# Patient Record
Sex: Female | Born: 1935 | Race: Black or African American | Hispanic: No | Marital: Single | Smoking: Never smoker
Health system: Southern US, Community
[De-identification: ages and names within clinical notes are randomized; demographics above are authoritative.]

## PROBLEM LIST (undated history)

## (undated) DIAGNOSIS — I5030 Unspecified diastolic (congestive) heart failure: Secondary | ICD-10-CM

## (undated) DIAGNOSIS — M109 Gout, unspecified: Secondary | ICD-10-CM

## (undated) DIAGNOSIS — M1990 Unspecified osteoarthritis, unspecified site: Secondary | ICD-10-CM

## (undated) DIAGNOSIS — N186 End stage renal disease: Secondary | ICD-10-CM

## (undated) DIAGNOSIS — I499 Cardiac arrhythmia, unspecified: Secondary | ICD-10-CM

## (undated) DIAGNOSIS — Z8614 Personal history of Methicillin resistant Staphylococcus aureus infection: Secondary | ICD-10-CM

## (undated) DIAGNOSIS — I480 Paroxysmal atrial fibrillation: Secondary | ICD-10-CM

## (undated) DIAGNOSIS — J40 Bronchitis, not specified as acute or chronic: Secondary | ICD-10-CM

## (undated) DIAGNOSIS — M549 Dorsalgia, unspecified: Secondary | ICD-10-CM

## (undated) DIAGNOSIS — H269 Unspecified cataract: Secondary | ICD-10-CM

## (undated) DIAGNOSIS — I10 Essential (primary) hypertension: Secondary | ICD-10-CM

## (undated) DIAGNOSIS — E119 Type 2 diabetes mellitus without complications: Secondary | ICD-10-CM

## (undated) DIAGNOSIS — Z992 Dependence on renal dialysis: Secondary | ICD-10-CM

## (undated) HISTORY — PX: EYE SURGERY: SHX253

## (undated) HISTORY — DX: Unspecified cataract: H26.9

## (undated) HISTORY — PX: CATARACT EXTRACTION, BILATERAL: SHX1313

---

## 2016-10-18 DIAGNOSIS — I361 Nonrheumatic tricuspid (valve) insufficiency: Secondary | ICD-10-CM | POA: Diagnosis not present

## 2016-10-18 DIAGNOSIS — I340 Nonrheumatic mitral (valve) insufficiency: Secondary | ICD-10-CM | POA: Diagnosis not present

## 2016-10-18 DIAGNOSIS — I509 Heart failure, unspecified: Secondary | ICD-10-CM | POA: Diagnosis not present

## 2016-10-29 DIAGNOSIS — E669 Obesity, unspecified: Secondary | ICD-10-CM | POA: Diagnosis not present

## 2016-10-29 DIAGNOSIS — M109 Gout, unspecified: Secondary | ICD-10-CM | POA: Diagnosis not present

## 2016-10-29 DIAGNOSIS — E1122 Type 2 diabetes mellitus with diabetic chronic kidney disease: Secondary | ICD-10-CM | POA: Diagnosis not present

## 2016-10-29 DIAGNOSIS — N184 Chronic kidney disease, stage 4 (severe): Secondary | ICD-10-CM | POA: Diagnosis not present

## 2016-10-29 DIAGNOSIS — D631 Anemia in chronic kidney disease: Secondary | ICD-10-CM | POA: Diagnosis not present

## 2016-10-29 DIAGNOSIS — I130 Hypertensive heart and chronic kidney disease with heart failure and stage 1 through stage 4 chronic kidney disease, or unspecified chronic kidney disease: Secondary | ICD-10-CM | POA: Diagnosis not present

## 2017-03-18 ENCOUNTER — Encounter
Admission: EM | Disposition: A | Discharge status: Home / Self Care | Payer: Self-pay | Source: Home / Self Care | Attending: Emergency Medicine

## 2017-03-18 ENCOUNTER — Encounter: Payer: Self-pay | Admitting: Emergency Medicine

## 2017-03-18 ENCOUNTER — Emergency Department
Admission: EM | Admit: 2017-03-18 | Discharge: 2017-03-18 | Disposition: A | Discharge status: Home / Self Care | Payer: Medicare Other | Attending: Emergency Medicine | Admitting: Emergency Medicine

## 2017-03-18 ENCOUNTER — Emergency Department: Payer: Medicare Other

## 2017-03-18 DIAGNOSIS — Z79899 Other long term (current) drug therapy: Secondary | ICD-10-CM | POA: Diagnosis not present

## 2017-03-18 DIAGNOSIS — E119 Type 2 diabetes mellitus without complications: Secondary | ICD-10-CM | POA: Diagnosis not present

## 2017-03-18 DIAGNOSIS — T82868A Thrombosis of vascular prosthetic devices, implants and grafts, initial encounter: Secondary | ICD-10-CM | POA: Diagnosis not present

## 2017-03-18 DIAGNOSIS — T8242XA Displacement of vascular dialysis catheter, initial encounter: Secondary | ICD-10-CM | POA: Diagnosis not present

## 2017-03-18 DIAGNOSIS — I120 Hypertensive chronic kidney disease with stage 5 chronic kidney disease or end stage renal disease: Secondary | ICD-10-CM | POA: Diagnosis not present

## 2017-03-18 DIAGNOSIS — Y828 Other medical devices associated with adverse incidents: Secondary | ICD-10-CM | POA: Insufficient documentation

## 2017-03-18 DIAGNOSIS — N186 End stage renal disease: Secondary | ICD-10-CM | POA: Diagnosis not present

## 2017-03-18 DIAGNOSIS — E8770 Fluid overload, unspecified: Secondary | ICD-10-CM | POA: Insufficient documentation

## 2017-03-18 DIAGNOSIS — N185 Chronic kidney disease, stage 5: Secondary | ICD-10-CM | POA: Diagnosis not present

## 2017-03-18 DIAGNOSIS — Z452 Encounter for adjustment and management of vascular access device: Secondary | ICD-10-CM | POA: Diagnosis present

## 2017-03-18 DIAGNOSIS — E785 Hyperlipidemia, unspecified: Secondary | ICD-10-CM | POA: Diagnosis not present

## 2017-03-18 DIAGNOSIS — I10 Essential (primary) hypertension: Secondary | ICD-10-CM | POA: Diagnosis not present

## 2017-03-18 HISTORY — PX: DIALYSIS/PERMA CATHETER INSERTION: CATH118288

## 2017-03-18 HISTORY — DX: Essential (primary) hypertension: I10

## 2017-03-18 HISTORY — DX: Dependence on renal dialysis: Z99.2

## 2017-03-18 HISTORY — DX: Type 2 diabetes mellitus without complications: E11.9

## 2017-03-18 LAB — CBC WITH DIFFERENTIAL/PLATELET
Basophils Absolute: 0.1 10*3/uL (ref 0–0.1)
Basophils Relative: 1 %
EOS ABS: 0.2 10*3/uL (ref 0–0.7)
Eosinophils Relative: 3 %
HEMATOCRIT: 23.3 % — AB (ref 35.0–47.0)
HEMOGLOBIN: 7.7 g/dL — AB (ref 12.0–16.0)
LYMPHS ABS: 1.6 10*3/uL (ref 1.0–3.6)
Lymphocytes Relative: 19 %
MCH: 31.7 pg (ref 26.0–34.0)
MCHC: 32.9 g/dL (ref 32.0–36.0)
MCV: 96.4 fL (ref 80.0–100.0)
MONO ABS: 0.9 10*3/uL (ref 0.2–0.9)
MONOS PCT: 10 %
NEUTROS ABS: 5.8 10*3/uL (ref 1.4–6.5)
NEUTROS PCT: 67 %
Platelets: 253 10*3/uL (ref 150–440)
RBC: 2.42 MIL/uL — ABNORMAL LOW (ref 3.80–5.20)
RDW: 18 % — AB (ref 11.5–14.5)
WBC: 8.5 10*3/uL (ref 3.6–11.0)

## 2017-03-18 LAB — HEPATIC FUNCTION PANEL
ALBUMIN: 3.5 g/dL (ref 3.5–5.0)
ALT: 16 U/L (ref 14–54)
AST: 23 U/L (ref 15–41)
Alkaline Phosphatase: 115 U/L (ref 38–126)
BILIRUBIN TOTAL: 0.6 mg/dL (ref 0.3–1.2)
Bilirubin, Direct: <0.1 mg/dL — ABNORMAL LOW (ref 0.1–0.5)
Total Protein: 7.8 g/dL (ref 6.5–8.1)

## 2017-03-18 LAB — GLUCOSE, CAPILLARY: Glucose-Capillary: 99 mg/dL (ref 65–99)

## 2017-03-18 LAB — BASIC METABOLIC PANEL
ANION GAP: 14 (ref 5–15)
BUN: 52 mg/dL — ABNORMAL HIGH (ref 6–20)
CALCIUM: 9.2 mg/dL (ref 8.9–10.3)
CO2: 26 mmol/L (ref 22–32)
Chloride: 101 mmol/L (ref 101–111)
Creatinine, Ser: 6.19 mg/dL — ABNORMAL HIGH (ref 0.44–1.00)
GFR calc non Af Amer: 6 mL/min — ABNORMAL LOW (ref >60)
GFR, EST AFRICAN AMERICAN: 6 mL/min — AB (ref >60)
GLUCOSE: 114 mg/dL — AB (ref 65–99)
POTASSIUM: 4.5 mmol/L (ref 3.5–5.1)
Sodium: 141 mmol/L (ref 135–145)

## 2017-03-18 LAB — PROTIME-INR
INR: 1.22
Prothrombin Time: 15.3 seconds — ABNORMAL HIGH (ref 11.4–15.2)

## 2017-03-18 SURGERY — DIALYSIS/PERMA CATHETER INSERTION
Anesthesia: Moderate Sedation

## 2017-03-18 MED ORDER — LIDOCAINE-EPINEPHRINE (PF) 2 %-1:200000 IJ SOLN
INTRAMUSCULAR | Status: AC
  Filled 2017-03-18: qty 20

## 2017-03-18 MED ORDER — FENTANYL CITRATE (PF) 100 MCG/2ML IJ SOLN
INTRAMUSCULAR | Status: DC | PRN
  Administered 2017-03-18 (×2): 50 ug via INTRAVENOUS

## 2017-03-18 MED ORDER — CEFAZOLIN SODIUM-DEXTROSE 2-4 GM/100ML-% IV SOLN
INTRAVENOUS | Status: AC
  Filled 2017-03-18: qty 100

## 2017-03-18 MED ORDER — MIDAZOLAM HCL 5 MG/5ML IJ SOLN
INTRAMUSCULAR | Status: AC
  Filled 2017-03-18: qty 5

## 2017-03-18 MED ORDER — MIDAZOLAM HCL 2 MG/2ML IJ SOLN
INTRAMUSCULAR | Status: DC | PRN
  Administered 2017-03-18 (×2): 1 mg via INTRAVENOUS

## 2017-03-18 MED ORDER — CEFAZOLIN SODIUM-DEXTROSE 2-4 GM/100ML-% IV SOLN
2.0000 g | Freq: Once | INTRAVENOUS | Status: AC
  Administered 2017-03-18: 2 g via INTRAVENOUS

## 2017-03-18 MED ORDER — SODIUM CHLORIDE 0.9 % IV SOLN: Freq: Once | INTRAVENOUS | Status: DC

## 2017-03-18 MED ORDER — HEPARIN (PORCINE) IN NACL 2-0.9 UNIT/ML-% IJ SOLN
INTRAMUSCULAR | Status: AC
  Filled 2017-03-18: qty 500

## 2017-03-18 MED ORDER — FENTANYL CITRATE (PF) 100 MCG/2ML IJ SOLN
INTRAMUSCULAR | Status: AC
  Filled 2017-03-18: qty 2

## 2017-03-18 MED ORDER — HEPARIN SODIUM (PORCINE) 10000 UNIT/ML IJ SOLN
INTRAMUSCULAR | Status: AC
  Filled 2017-03-18: qty 1

## 2017-03-18 MED ORDER — CEFAZOLIN SODIUM-DEXTROSE 1-4 GM/50ML-% IV SOLN: 1.0000 g | INTRAVENOUS | Status: DC

## 2017-03-18 SURGICAL SUPPLY — 10 items
CATH PALINDROME RT-P 15FX23CM (CATHETERS) ×3 IMPLANT
DERMABOND ADVANCED (GAUZE/BANDAGES/DRESSINGS) ×2
DERMABOND ADVANCED .7 DNX12 (GAUZE/BANDAGES/DRESSINGS) ×1 IMPLANT
DRAPE INCISE IOBAN 66X45 STRL (DRAPES) ×3 IMPLANT
GUIDEWIRE SUPER STIFF .035X180 (WIRE) ×3 IMPLANT
NEEDLE ENTRY 21GA 7CM ECHOTIP (NEEDLE) ×3 IMPLANT
PACK ANGIOGRAPHY (CUSTOM PROCEDURE TRAY) ×3 IMPLANT
SET INTRO CAPELLA COAXIAL (SET/KITS/TRAYS/PACK) ×3 IMPLANT
SUT MNCRL AB 4-0 PS2 18 (SUTURE) ×3 IMPLANT
SUT SILK 0 FSL (SUTURE) ×3 IMPLANT

## 2017-03-18 NOTE — ED Notes (Signed)
Patient transported to Special recovery by Jonni Sanger, ED Medic. Patient will be discharged from specials per Dr. Corky Downs.

## 2017-03-18 NOTE — ED Notes (Signed)
Spoke with [place] from vascular in regards to patients procedure. She states Dr. Delana Meyer is scheduled to be in the OR until 1500 but is going to try to fit her in. Dr. Corky Downs made aware. Patient updated as well. Alyse Low states she will call once they are ready for her.

## 2017-03-18 NOTE — Op Note (Signed)
[facility]   OPERATIVE NOTE     PROCEDURE: 1. Insertion of a left IJ tunneled dialysis catheter. 2. Catheter placement and cannulation under ultrasound and fluoroscopic guidance  PRE-OPERATIVE DIAGNOSIS: end-stage renal requiring hemodialysis  POST-OPERATIVE DIAGNOSIS: same as above  SURGEON: Hortencia Pilar  ANESTHESIA: Conscious sedation was administered under my direct supervision by the interventional radiology RN. IV Versed plus fentanyl were utilized. Continuous ECG, pulse oximetry and blood pressure was monitored throughout the entire procedure.  Conscious sedation was for a total of 21 minutes.  ESTIMATED BLOOD LOSS: Minimal  FINDING(S): 1.  Tips of the catheter in the right atrium on fluoroscopy 2.  No obvious pneumothorax on fluoroscopy  SPECIMEN(S):  none  INDICATIONS:   Stacy Pham is a 83 y.o. female  presents with end stage renal disease.  Therefore, the patient requires a tunneled dialysis catheter placement.  The patient is informed of  the risks catheter placement include but are not limited to: bleeding, infection, central venous injury, pneumothorax, possible venous stenosis, possible malpositioning in the venous system, and possible infections related to long-term catheter presence.  The patient was aware of these risks and agreed to proceed.  DESCRIPTION: The patient was taken back to Special Procedure suite.  Prior to sedation, the patient was given IV antibiotics.  After obtaining adequate sedation, the patient was prepped and draped in the standard fashion for a chest or neck tunneled dialysis catheter placement.  Appropriate Time Out is called.     The the left neck and chest wall are then infiltrated with 1% Lidocaine with epinepherine.  A 23 cm tip to cuff pallindrome catheter is then selected, opened on the back table and prepped. Ultrasound is placed in a sterile sleeve.  Under ultrasound guidance, the left IJ vein is examined  and is noted to be echolucent and easily compressible indicating patency.   An image is recorded for the permanent record.  The left IJ vein is cannulated with the microneedle under direct ultrasound vissualization.  A Microwire followed by a micro sheath is then inserted without difficulty.   A J-wire was then advanced under fluoroscopic guidance into the inferior vena cava and the wire was secured.  Small counter incision was then made at the wire insertion site. A small pocket was fashioned with blunt dissection to allow easier passage of the cuff.  The dilator and peel-away sheath are then advanced over the wire under fluoroscopic guidance. The catheters and advanced through the peel-away sheath. It is approximated to the chest wall after verifying the tips at the atrial caval junction and an exit site is selected.  Small incision is made at the selected exit site and the tunneling device was passed subcutaneously to the neck counter incision. Catheter is then pulled through the subcutaneous tunnel. The catheter is then verified for tip position under fluoroscopy, transected and the hub assembly connected.    Each port was tested by aspirating and flushing.  No resistance was noted.  Each port was then thoroughly flushed with heparinized saline.  The catheter was secured in placed with two interrupted stitches of 0 silk tied to the catheter.  The neck incision was closed with a U-stitch of 4-0 Monocryl.  The neck and chest incision were cleaned and sterile bandages applied including a Biopatch.  Each port was then packed with concentrated heparin (1000 Units/mL) at the manufacturer recommended volumes to each port.  Sterile caps were applied to each port.  On completion fluoroscopy, the  tips of the catheter were in the right atrium, and there was no evidence of pneumothorax.  COMPLICATIONS: None  CONDITION: Unchanged   Hortencia Pilar [facility] Office: [phone]   03/18/2017,  4:06 PM

## 2017-03-18 NOTE — ED Notes (Signed)
Patient made aware of need of urine sample. States she is unable to void at this time.  

## 2017-03-18 NOTE — ED Provider Notes (Addendum)
[facility] Emergency Department Provider Note  ____________________________________________   First MD Initiated Contact with Patient 03/18/17 [phone]     (approximate)  I have reviewed the triage vital signs and the nursing notes.   HISTORY  Chief Complaint Vascular Access Problem    HPI Stacy Pham is a 83 y.o. female who self presents to the emergency department after inadvertently dislodging her dialysis catheter. She is a past medical history of hypertension and end-stage renal disease for which she receives hemodialysis Monday Wednesday and Friday. She was getting dressed this morning to go to dialysis and when she put on her closed she inadvertently ripped out her vascath.  the patient moved to [place] from [place] earlier this week and has yet to establish care with a primary care physician or nephrologist. She has yet to have planning for an AV fistula. She denies current chest pain shortness of breath abdominal pain nausea or vomiting.   Past Medical History:  Diagnosis Date  . Diabetes mellitus without complication ([place])   . Dialysis patient ([place])    Mon-Wed-Friday  . Hypertension   . Renal failure     There are no active problems to display for this patient.   History reviewed. No pertinent surgical history.  Prior to Admission medications   Medication Sig Start Date End Date Taking? Authorizing Provider  allopurinol (ZYLOPRIM) 100 MG tablet Take 100 mg by mouth daily.   Yes [provider]  apixaban (ELIQUIS) 2.5 MG TABS tablet Take 2.5 mg by mouth 2 (two) times daily.   Yes [provider]  atorvastatin (LIPITOR) 20 MG tablet Take 20 mg by mouth daily at 6 PM.   Yes [provider]  colchicine 0.6 MG tablet Take 0.6 mg by mouth every other day.   Yes [provider]  diltiazem (DILACOR XR) 240 MG 24 hr capsule Take 240 mg by mouth daily.   Yes [provider]  gabapentin  (NEURONTIN) 100 MG capsule Take 100 mg by mouth 3 (three) times daily.   Yes [provider]  labetalol (NORMODYNE) 300 MG tablet Take 300 mg by mouth 3 (three) times daily.   Yes [provider]  losartan (COZAAR) 100 MG tablet Take 100 mg by mouth daily.   Yes [provider]    Allergies Patient has no known allergies.  No family history on file.  Social History Social History  Substance Use Topics  . Smoking status: Not on file  . Smokeless tobacco: Not on file  . Alcohol use Not on file    Review of Systems Constitutional: No fever/chills Eyes: No visual changes. ENT: No sore throat. Cardiovascular: Denies chest pain. Respiratory: Denies shortness of breath. Gastrointestinal: No abdominal pain.  No nausea, no vomiting.  No diarrhea.  No constipation. Genitourinary: Negative for dysuria. Musculoskeletal: Negative for back pain. Skin: Negative for rash. Neurological: Negative for headaches, focal weakness or numbness.   ____________________________________________   PHYSICAL EXAM:  VITAL SIGNS: ED Triage Vitals  Enc Vitals Group     BP 03/18/17 0549 (!) 195/56     Pulse Rate 03/18/17 0549 75     Resp 03/18/17 0549 20     Temp 03/18/17 0549 98.7 F (37.1 C)     Temp Source 03/18/17 0549 Oral     SpO2 03/18/17 0549 98 %     Weight 03/18/17 0546 181 lb (82.1 kg)     Height 03/18/17 0546 5\' 2"  (1.575 m)  Head Circumference --      Peak Flow --      Pain Score --      Pain Loc --      Pain Edu? --      Excl. in [place]? --     Constitutional: alert and oriented 4 pleasant cooperative speaks in full clear sentences no diaphoresis Eyes: PERRL EOMI. Head: Atraumatic. Nose: No congestion/rhinnorhea. Mouth/Throat: No trismus Neck: No stridor.   Cardiovascular: Normal rate, regular rhythm. Grossly normal heart sounds.  Good peripheral circulation. Respiratory: Normal respiratory effort.  No retractions. Lungs CTAB and moving good  air Gastrointestinal: soft nontender Musculoskeletal: 1+ pitting edema bilateral lower extremities legs equal in size Neurologic:  Normal speech and language. No gross focal neurologic deficits are appreciated. Skin:  Vas-Cath to right subclavian completely removed by the time she got here no bleeding or hematoma around the site Psychiatric: Mood and affect are normal. Speech and behavior are normal.    ____________________________________________   DIFFERENTIAL includes but not limited to  hyperkalemia, fluid overload, acidemia, end-stage renal disease ____________________________________________   LABS (all labs ordered are listed, but only abnormal results are displayed)  Labs Reviewed  BASIC METABOLIC PANEL - Abnormal; Notable for the following:       Result Value   Glucose, Bld 114 (*)    BUN 52 (*)    Creatinine, Ser 6.19 (*)    GFR calc non Af Amer 6 (*)    GFR calc Af Amer 6 (*)    All other components within normal limits  HEPATIC FUNCTION PANEL - Abnormal; Notable for the following:    Bilirubin, Direct <0.1 (*)    All other components within normal limits  CBC WITH DIFFERENTIAL/PLATELET - Abnormal; Notable for the following:    RBC 2.42 (*)    Hemoglobin 7.7 (*)    HCT 23.3 (*)    RDW 18.0 (*)    All other components within normal limits  PROTIME-INR - Abnormal; Notable for the following:    Prothrombin Time 15.3 (*)    All other components within normal limits  URINALYSIS, COMPLETE (UACMP) WITH MICROSCOPIC    no indication for emergent dialysis __________________________________________  EKG  ED ECG REPORT I, Darel Hong, the attending physician, personally viewed and interpreted this ECG.  Date: 03/18/2017 EKG Time:  Rate: 69 Rhythm: normal sinus rhythm QRS Axis: normal Intervals: normal ST/T Wave abnormalities: normal Narrative Interpretation: no evidence of acute ischemia  ____________________________________________  RADIOLOGY  chest  x-ray with cardiomegaly but no obvious fluid overload ____________________________________________   PROCEDURES  Procedure(s) performed: no  Procedures  Critical Care performed: no  Observation: no ____________________________________________   INITIAL IMPRESSION / ASSESSMENT AND PLAN / ED COURSE  Pertinent labs & imaging results that were available during my care of the patient were reviewed by me and considered in my medical decision making (see chart for details).  the patient arrives hemodynamically stable and very well-appearing. Her Vas-Cath is completely removed. Chest x-ray confirms no pneumothorax. Labs are pending but the patient will likely require inpatient admission for IR guided placement of a new hemodialysis catheter.    ----------------------------------------- ----------------------------------------- 7:19 AM on 03/18/2017 -----------------------------------------  I discussed the case with on-call vascular surgeon Dr. Delana Meyer who will kindly place a new dialysis catheter today.________________   FINAL CLINICAL IMPRESSION(S) / ED DIAGNOSES  Final diagnoses:  End stage renal disease ([place])      NEW MEDICATIONS STARTED DURING THIS VISIT:  New Prescriptions   No medications  on file     Note:  This document was prepared using Dragon voice recognition software and may include unintentional dictation errors.     Darel Hong, MD 03/18/17 6945    Darel Hong, MD 03/18/17 0388    Darel Hong, MD 03/18/17 [phone]

## 2017-03-18 NOTE — ED Notes (Signed)
Pt's vascular access for dialysis pulled out. Tip appears intact. Catheter saved in plastic sack for further inspection. Pt denies pain at this time. Bleeding from access site controlled at this time.

## 2017-03-18 NOTE — ED Provider Notes (Signed)
Patient to special procedures for catheter placement. Seen by nephrology, dialysis organized for tomorrow   Lavonia Drafts, MD 03/18/17 1315

## 2017-03-18 NOTE — ED Triage Notes (Signed)
Pt to triage via w/c with no distress noted; pt reports dialysis cath pulled out this morning, due dialysis today; dressing in place with no bleeding; pt with no c/o

## 2017-03-18 NOTE — H&P (Signed)
[facility] Admission History & Physical  MRN : [id number]  Stacy Pham is a 83 y.o. ([birth year]) female who presents with chief complaint of  Chief Complaint  Patient presents with  . Vascular Access Problem  .  History of Present Illness: the patient is an 83 year old woman who presented to [facility] earlier this morningafter she accidentally pulled out her right IJ tunneled catheter. She presented to [place] from [place] Wednesday at [place]. Currently she is scheduled for dialysis Monday Wednesdays and Fridays.  Patient denies fever chills at home. No nausea vomiting. No recent illnesses. She denied drainage at the catheter site. This was for first catheter and she is new to dialysis and has not been worked up for upper extremity access.    No current facility-administered medications for this encounter.    Current Outpatient Prescriptions  Medication Sig Dispense Refill  . allopurinol (ZYLOPRIM) 100 MG tablet Take 100 mg by mouth daily.    [name] apixaban (ELIQUIS) 2.5 MG TABS tablet Take 2.5 mg by mouth 2 (two) times daily.    [name] atorvastatin (LIPITOR) 20 MG tablet Take 20 mg by mouth daily at 6 PM.    . colchicine 0.6 MG tablet Take 0.6 mg by mouth every other day.    . diltiazem (DILACOR XR) 240 MG 24 hr capsule Take 240 mg by mouth daily.    [name] gabapentin (NEURONTIN) 100 MG capsule Take 100 mg by mouth 3 (three) times daily.    [name] labetalol (NORMODYNE) 300 MG tablet Take 300 mg by mouth 3 (three) times daily.    [name] losartan (COZAAR) 100 MG tablet Take 100 mg by mouth daily.      Past Medical History:  Diagnosis Date  . Diabetes mellitus without complication ([place])   . Dialysis patient ([place])    Mon-Wed-Friday  . Hypertension   . Renal failure     History reviewed. No pertinent surgical history.  Social History Social History  Substance Use Topics  .  Smoking status: Not on file  . Smokeless tobacco: Not on file  . Alcohol use Not on file    Family History No family history on file. No family history of bleeding/clotting disorders, porphyria or autoimmune disease   No Known Allergies   REVIEW OF SYSTEMS (Negative unless checked)  Constitutional: [] Weight loss  [] Fever  [] Chills Cardiac: [] Chest pain   [] Chest pressure   [] Palpitations   [] Shortness of breath when laying flat   [] Shortness of breath at rest   [] Shortness of breath with exertion. Vascular:  [] Pain in legs with walking   [] Pain in legs at rest   [] Pain in legs when laying flat   [] Claudication   [] Pain in feet when walking  [] Pain in feet at rest  [] Pain in feet when laying flat   [] History of DVT   [] Phlebitis   [] Swelling in legs   [] Varicose veins   [] Non-healing ulcers Pulmonary:   [] Uses home oxygen   [] Productive cough   [] Hemoptysis   [] Wheeze  [] COPD   [] Asthma Neurologic:  [] Dizziness  [] Blackouts   [] Seizures   [] History of stroke   [] History of TIA  [] Aphasia   [] Temporary blindness   [] Dysphagia   [] Weakness or numbness in arms   [] Weakness or numbness in legs Musculoskeletal:  [] Arthritis   [] Joint swelling   [] Joint pain   [] Low back pain Hematologic:  [] Easy bruising  []   Easy bleeding   [] Hypercoagulable state   [] Anemic  [] Hepatitis Gastrointestinal:  [] Blood in stool   [] Vomiting blood  [] Gastroesophageal reflux/heartburn   [] Difficulty swallowing. Genitourinary:  [x] Chronic kidney disease   [] Difficult urination  [] Frequent urination  [] Burning with urination   [] Blood in urine Skin:  [] Rashes   [] Ulcers   [] Wounds Psychological:  [] History of anxiety   []  History of major depression.  Physical Examination  Vitals:   03/18/17 0830 03/18/17 0930 03/18/17 1000 03/18/17 1030  BP: (!) 177/62 (!) 188/69 (!) 181/62 (!) 172/78  Pulse: 69 71 69 72  Resp: 18 18 14 17   Temp:      TempSrc:      SpO2: 100% 95% 99% 100%  Weight:      Height:       Body mass  index is 33.11 kg/m. Gen: WD/WN, NAD Head: [place]/AT, No temporalis wasting. Prominent temp pulse not noted. Ear/Nose/Throat: Hearing grossly intact, nares w/o erythema or drainage, oropharynx w/o Erythema/Exudate,  Eyes: Conjunctiva clear, sclera non-icteric Neck: Trachea midline.  No JVD.  Pulmonary:  Good air movement, respirations not labored, no use of accessory muscles.  Cardiac: RRR, normal S1, S2. Vascular: right IJ tunneled catheter site is clean no drainage. Vessel Right Left  Radial Palpable Palpable  Ulnar Palpable Palpable  Brachial Palpable Palpable  Gastrointestinal: soft, non-tender/non-distended. No guarding/reflex.  Musculoskeletal: M/S 5/5 throughout.  Extremities without ischemic changes.  No deformity or atrophy.  Neurologic: Sensation grossly intact in extremities.  Symmetrical.  Speech is fluent. Motor exam as listed above. Psychiatric: Judgment intact, Mood & affect appropriate for pt's clinical situation. Dermatologic: No rashes or ulcers noted.  No cellulitis or open wounds. Lymph : No Cervical, Axillary, or Inguinal lymphadenopathy.     CBC Lab Results  Component Value Date   WBC 8.5 03/18/2017   HGB 7.7 (L) 03/18/2017   HCT 23.3 (L) 03/18/2017   MCV 96.4 03/18/2017   PLT 253 03/18/2017    BMET    Component Value Date/Time   NA 141 03/18/2017 0627   K 4.5 03/18/2017 0627   CL 101 03/18/2017 0627   CO2 26 03/18/2017 0627   GLUCOSE 114 (H) 03/18/2017 0627   BUN 52 (H) 03/18/2017 0627   CREATININE 6.19 (H) 03/18/2017 0627   CALCIUM 9.2 03/18/2017 0627   GFRNONAA 6 (L) 03/18/2017 0627   GFRAA 6 (L) 03/18/2017 0627   Estimated Creatinine Clearance: 6.7 mL/min (A) (by C-G formula based on SCr of 6.19 mg/dL (H)).  COAG Lab Results  Component Value Date   INR 1.22 03/18/2017    Radiology Dg Chest Port 1 View  Result Date: 03/18/2017 CLINICAL DATA:  Shortness of breath.  Missed dialysis. EXAM: PORTABLE CHEST 1 VIEW COMPARISON:  None. FINDINGS:  Cardiac enlargement. No vascular congestion or edema. No focal consolidation. No blunting of costophrenic angles. No pneumothorax. Calcified and tortuous aorta. IMPRESSION: Cardiac enlargement. No evidence of active pulmonary disease. Aortic atherosclerosis. Electronically Signed   By: Lucienne Capers M.D.   On: 03/18/2017 06:20    Assessment/Plan 1. Complication dialysis device: Patient's tunneled catheter is no longer in position and she will require new one so that she can continue dialysis. There is no evidence that she has infection. I will therefore plan for a new tunneled catheter rather than a temporary catheter. Left IJ placement was discussed  The risks and benefits for catheter insertion were reviewed all questions were answered patient agreed to proceed.   A total of 65 minutes was  spent with this patient and greater than 50% was spent in counseling and coordination of care with the patient.  Discussion included the treatment options for ialysis including indications for surgery and intervention today as well as discussion of upper extremity access and even discussion of peritoneal dialysis access.  Also discussed is the appropriate timing of treatment.  In addition medical therapy was discussed.  2. End-stage renal disease:Patient will continue dialysis once her permacath has been placed she has made arrangements with her [facility] unit. She does not have a nephrologist at this time.  3. Hypertension: Medications of been reviewed and she will continue her antihypertensives as ordered.  4.  Hyperlipidemia: Patient will continue her statin as ordered.   Hortencia Pilar, MD  03/18/2017 11:44 AM

## 2017-03-21 ENCOUNTER — Encounter: Payer: Self-pay | Admitting: Vascular Surgery

## 2017-03-31 ENCOUNTER — Encounter (INDEPENDENT_AMBULATORY_CARE_PROVIDER_SITE_OTHER): Payer: Self-pay | Admitting: Vascular Surgery

## 2017-03-31 ENCOUNTER — Ambulatory Visit (INDEPENDENT_AMBULATORY_CARE_PROVIDER_SITE_OTHER): Payer: Medicare Other | Admitting: Vascular Surgery

## 2017-03-31 ENCOUNTER — Other Ambulatory Visit (INDEPENDENT_AMBULATORY_CARE_PROVIDER_SITE_OTHER): Payer: Self-pay | Admitting: Vascular Surgery

## 2017-03-31 ENCOUNTER — Other Ambulatory Visit (INDEPENDENT_AMBULATORY_CARE_PROVIDER_SITE_OTHER): Payer: Medicare Other

## 2017-03-31 VITALS — BP 201/76 | HR 67 | Resp 14 | Ht 62.0 in | Wt 171.0 lb

## 2017-03-31 DIAGNOSIS — N186 End stage renal disease: Secondary | ICD-10-CM

## 2017-03-31 DIAGNOSIS — Z992 Dependence on renal dialysis: Secondary | ICD-10-CM | POA: Diagnosis not present

## 2017-03-31 NOTE — Progress Notes (Signed)
Subjective:    Patient ID: Stacy Pham, female    DOB: 1930-09-09, 83 y.o.   MRN: [id number] Chief Complaint  Patient presents with  . Follow-up    Vein mapping for new HDA   Presents as a new patient to discuss vascular studies. Patient with end-stage renal requiring dialysis. Seen with daughter. The patient is currently being managed by a right PermCath without issue. She presents today without complaint. Patient is right handed. Bilateral upper extremity vein mapping was notable for creation of a left graft. The patient and her family are still considering peritoneal dialysis catheter creation as well. Denies any fever, nausea or vomiting.   Review of Systems  Constitutional: Negative.   HENT: Negative.   Eyes: Negative.   Respiratory: Negative.   Cardiovascular: Negative.   Gastrointestinal:       ESRD  Endocrine: Negative.   Genitourinary: Negative.   Musculoskeletal: Negative.   Skin: Negative.   Allergic/Immunologic: Negative.   Neurological: Negative.   Hematological: Negative.   Psychiatric/Behavioral: Negative.       Objective:   Physical Exam  Constitutional: She is oriented to person, place, and time. She appears well-developed and well-nourished. No distress.  HENT:  Head: Normocephalic and atraumatic.  Eyes: Pupils are equal, round, and reactive to light. Conjunctivae are normal.  Neck: Normal range of motion.  Cardiovascular: Normal rate, regular rhythm, normal heart sounds and intact distal pulses.   Pulses:      Radial pulses are 2+ on the right side, and 2+ on the left side.  Pulmonary/Chest: Effort normal and breath sounds normal. No respiratory distress. She has no wheezes. She has no rales.  Abdominal: Soft. She exhibits no distension. There is no tenderness. There is no rebound and no guarding.  Musculoskeletal: Normal range of motion. She exhibits no edema.  Neurological: She is alert and oriented to person, place, and time.  Skin: Skin is warm  and dry. She is not diaphoretic.  Psychiatric: She has a normal mood and affect. Her behavior is normal. Judgment and thought content normal.  Vitals reviewed.  BP (!) 201/76 (BP Location: Right Arm)   Pulse 67   Resp 14   Ht 5\' 2"  (1.575 m)   Wt 171 lb (77.6 kg)   BMI 31.28 kg/m   Past Medical History:  Diagnosis Date  . Diabetes mellitus without complication ([place])   . Dialysis patient ([place])    Mon-Wed-Friday  . Hypertension   . Renal failure     Social History   Social History  . Marital status: Single    Spouse name: N/A  . Number of children: N/A  . Years of education: N/A   Occupational History  . Not on file.   Social History Main Topics  . Smoking status: Never Smoker  . Smokeless tobacco: Never Used  . Alcohol use No  . Drug use: Unknown  . Sexual activity: Not on file   Other Topics Concern  . Not on file   Social History Narrative  . No narrative on file    Past Surgical History:  Procedure Laterality Date  . DIALYSIS/PERMA CATHETER INSERTION N/A 03/18/2017   Procedure: DIALYSIS/PERMA CATHETER INSERTION;  Surgeon: Katha Cabal, MD;  Location: [facility];  Service: Cardiovascular;  Laterality: N/A;    No family history on file.  No Known Allergies     Assessment & Plan:  Presents as a new patient to discuss vascular studies. Patient with end-stage renal requiring dialysis.  Seen with daughter. The patient is currently being managed by a right PermCath without issue. She presents today without complaint. Patient is right handed. Bilateral upper extremity vein mapping was notable for creation of a left graft. The patient and her family are still considering peritoneal dialysis catheter creation as well. Denies any fever, nausea or vomiting.  1. ESRD on dialysis [facility]) - New Patient is in need of a permanent dialysis access. Patient is right handed. Bilateral upper extremity vein mapping was notable for left rake ax graft creation. The  patient and her family would like to consider peritoneal dialysis catheter insertion as well Once the family reaches occlusion they will call the office and let us know their decision. H&P is updated.  2. Hyperlipidemia - Stable Encouraged good control as its slows the progression of atherosclerotic disease.  3. Hypertension - Stable Encouraged good control as its slows the progression of atherosclerotic disease  Current Outpatient Prescriptions on File Prior to Visit  Medication Sig Dispense Refill  . allopurinol (ZYLOPRIM) 100 MG tablet Take 100 mg by mouth daily.    [name] apixaban (ELIQUIS) 2.5 MG TABS tablet Take 2.5 mg by mouth 2 (two) times daily.    [name] atorvastatin (LIPITOR) 20 MG tablet Take 20 mg by mouth daily at 6 PM.    . colchicine 0.6 MG tablet Take 0.6 mg by mouth every other day.    . diltiazem (DILACOR XR) 240 MG 24 hr capsule Take 240 mg by mouth daily.    [name] gabapentin (NEURONTIN) 100 MG capsule Take 100 mg by mouth 3 (three) times daily.    [name] labetalol (NORMODYNE) 300 MG tablet Take 300 mg by mouth 3 (three) times daily.    [name] losartan (COZAAR) 100 MG tablet Take 100 mg by mouth daily.     No current facility-administered medications on file prior to visit.     There are no Patient Instructions on file for this visit. No Follow-up on file.   Evans Levee A Ellagrace Yoshida, PA-C

## 2017-04-12 ENCOUNTER — Encounter (INDEPENDENT_AMBULATORY_CARE_PROVIDER_SITE_OTHER): Payer: Self-pay

## 2017-04-12 ENCOUNTER — Telehealth (INDEPENDENT_AMBULATORY_CARE_PROVIDER_SITE_OTHER): Payer: Self-pay

## 2017-04-12 NOTE — Telephone Encounter (Signed)
I have attempted to contact the daughter regarding scheduling the patient for surgery twice, I have left messages for a return call each time.

## 2017-04-12 NOTE — Telephone Encounter (Signed)
Patient's daughter returned my call and patient is scheduled for surgery.

## 2017-04-14 ENCOUNTER — Other Ambulatory Visit (INDEPENDENT_AMBULATORY_CARE_PROVIDER_SITE_OTHER): Payer: Self-pay | Admitting: Vascular Surgery

## 2017-04-21 ENCOUNTER — Inpatient Hospital Stay: Admission: RE | Admit: 2017-04-21 | Payer: Medicare Other | Source: Ambulatory Visit

## 2017-04-22 ENCOUNTER — Ambulatory Visit
Admission: RE | Admit: 2017-04-22 | Discharge: 2017-04-22 | Disposition: A | Discharge status: Home / Self Care | Payer: Medicare Other | Source: Ambulatory Visit | Attending: Vascular Surgery | Admitting: Vascular Surgery

## 2017-04-22 DIAGNOSIS — Z01810 Encounter for preprocedural cardiovascular examination: Secondary | ICD-10-CM | POA: Insufficient documentation

## 2017-04-22 DIAGNOSIS — R9431 Abnormal electrocardiogram [ECG] [EKG]: Secondary | ICD-10-CM | POA: Diagnosis not present

## 2017-04-22 DIAGNOSIS — N186 End stage renal disease: Secondary | ICD-10-CM | POA: Diagnosis not present

## 2017-04-22 LAB — CBC WITH DIFFERENTIAL/PLATELET
Basophils Absolute: 0.1 10*3/uL (ref 0–0.1)
Basophils Relative: 1 %
EOS PCT: 1 %
Eosinophils Absolute: 0.1 10*3/uL (ref 0–0.7)
HCT: 41.2 % (ref 35.0–47.0)
Hemoglobin: 13.5 g/dL (ref 12.0–16.0)
LYMPHS ABS: 1.2 10*3/uL (ref 1.0–3.6)
LYMPHS PCT: 21 %
MCH: 32.8 pg (ref 26.0–34.0)
MCHC: 32.6 g/dL (ref 32.0–36.0)
MCV: 100.5 fL — AB (ref 80.0–100.0)
Monocytes Absolute: 0.8 10*3/uL (ref 0.2–0.9)
Monocytes Relative: 13 %
NEUTROS ABS: 3.9 10*3/uL (ref 1.4–6.5)
Neutrophils Relative %: 64 %
PLATELETS: 291 10*3/uL (ref 150–440)
RBC: 4.1 MIL/uL (ref 3.80–5.20)
RDW: 17 % — ABNORMAL HIGH (ref 11.5–14.5)
WBC: 6 10*3/uL (ref 3.6–11.0)

## 2017-04-22 LAB — BASIC METABOLIC PANEL
Anion gap: 17 — ABNORMAL HIGH (ref 5–15)
BUN: 41 mg/dL — AB (ref 6–20)
CHLORIDE: 95 mmol/L — AB (ref 101–111)
CO2: 24 mmol/L (ref 22–32)
Calcium: 10 mg/dL (ref 8.9–10.3)
Creatinine, Ser: 7.96 mg/dL — ABNORMAL HIGH (ref 0.44–1.00)
GFR calc Af Amer: 5 mL/min — ABNORMAL LOW (ref >60)
GFR calc non Af Amer: 4 mL/min — ABNORMAL LOW (ref >60)
GLUCOSE: 86 mg/dL (ref 65–99)
POTASSIUM: 5.2 mmol/L — AB (ref 3.5–5.1)
SODIUM: 136 mmol/L (ref 135–145)

## 2017-04-22 LAB — TYPE AND SCREEN
ABO/RH(D): B POS
ANTIBODY SCREEN: NEGATIVE

## 2017-04-22 LAB — PROTIME-INR
INR: 1.09
Prothrombin Time: 14 seconds (ref 11.4–15.2)

## 2017-04-22 LAB — APTT: aPTT: 32 seconds (ref 24–36)

## 2017-04-22 NOTE — Patient Instructions (Signed)
Your procedure is scheduled on: Wednesday 04/27/17 Report to [facility]. 2ND FLOOR MEDICAL MALL ENTRANCE. To find out your arrival time please call [phone] between 1PM - 3PM on Tuesday 04/26/17.  Remember: Instructions that are not followed completely may result in serious medical risk, up to and including death, or upon the discretion of your surgeon and anesthesiologist your surgery may need to be rescheduled.    __X__ 1. Do not eat anything after midnight the night before your    procedure.  No gum chewing or hard candies.  You may drink clear   liquids up to 2 hours before you are scheduled to arrive at the   hospital for your procedure. Do not drink clear liquids within 2   hours of scheduled arrival to the hospital as this may lead to your   procedure being delayed or rescheduled.       Clear liquids include:   Water or Apple juice without pulp   Clear carbohydrate beverage such as Clearfast or Gatorade   Black coffee or Clear Tea (no milk, no creamer, do not add anything   to the coffee or tea)   Wednesday  Diabetics should only drink water   __X__ 2. No Alcohol for 24 hours before or after surgery.   ____ 3. Bring all medications with you on the day of surgery if instructed.    __X__ 4. Notify your doctor if there is any change in your medical condition     (cold, fever, infections).             __X___5. No smoking within 24 hours of your surgery.     Do not wear jewelry, make-up, hairpins, clips or nail polish.  Do not wear lotions, powders, or perfumes.   Do not shave 48 hours prior to surgery. Men may shave face and neck.  Do not bring valuables to the hospital.    [facility] is not responsible for any belongings or valuables.               Contacts, dentures or bridgework may not be worn into surgery.  Leave your suitcase in the car. After surgery it may be brought to your room.  For patients admitted to the hospital, discharge time is determined by your                 treatment team.   Patients discharged the day of surgery will not be allowed to drive home.   Please read over the following fact sheets that you were given:   MRSA Information   __X__ Take these medicines the morning of surgery with A SIP OF WATER:    1. DILTIAZEM  2. GABAPENTIN  3. HYDRALAZINE  4. LABETOLOL  5.  6.  ____ Fleet Enema (as directed)   __X_ Use CHG Soap/SAGE wipes as directed  ____ Use inhalers on the day of surgery  ____ Stop metformin 2 days prior to surgery    ____ Take 1/2 of usual insulin dose the night before surgery and none on the morning of surgery.   __X__ Stop Coumadin/Plavix/aspirin on CONTACT DR SCHNIER ABOUT STOPPING ELIQUIS (blood thinner)  __X__ Stop Anti-inflammatories such as Advil, Aleve, Ibuprofen, Motrin, Naproxen, Naprosyn, Goodies,powder, or aspirin products.  OK to take Tylenol.   __X__ Stop supplements, Vitamin E, Fish Oil until after surgery.    ____ Bring C-Pap to the hospital.

## 2017-04-25 ENCOUNTER — Other Ambulatory Visit (INDEPENDENT_AMBULATORY_CARE_PROVIDER_SITE_OTHER): Payer: Self-pay | Admitting: Vascular Surgery

## 2017-04-25 LAB — SURGICAL PCR SCREEN
MRSA, PCR: POSITIVE — AB
STAPHYLOCOCCUS AUREUS: POSITIVE — AB

## 2017-04-25 MED ORDER — SODIUM CHLORIDE 0.9 % IV SOLN: 1000.0000 mg | Freq: Once | INTRAVENOUS | Status: DC

## 2017-04-25 NOTE — Pre-Procedure Instructions (Signed)
Labs for upcoming surgery faxed to Dr Nino Parsley office - BMP.

## 2017-04-25 NOTE — Pre-Procedure Instructions (Signed)
Positive PCR screen faxed to Dr Nino Parsley office

## 2017-04-26 MED ORDER — VANCOMYCIN HCL IN DEXTROSE 1-5 GM/200ML-% IV SOLN: 1000.0000 mg | Freq: Once | INTRAVENOUS | Status: DC

## 2017-04-27 ENCOUNTER — Ambulatory Visit: Payer: Medicare Other | Admitting: Anesthesiology

## 2017-04-27 ENCOUNTER — Encounter: Payer: Self-pay | Admitting: Emergency Medicine

## 2017-04-27 ENCOUNTER — Inpatient Hospital Stay
Admission: RE | Admit: 2017-04-27 | Discharge: 2017-04-28 | DRG: 640 | Disposition: A | Discharge status: Home / Self Care | Payer: Medicare Other | Source: Ambulatory Visit | Attending: Internal Medicine | Admitting: Internal Medicine

## 2017-04-27 ENCOUNTER — Encounter
Admission: RE | Disposition: A | Discharge status: Home / Self Care | Payer: Self-pay | Source: Ambulatory Visit | Attending: Internal Medicine

## 2017-04-27 DIAGNOSIS — Z5309 Procedure and treatment not carried out because of other contraindication: Secondary | ICD-10-CM | POA: Diagnosis present

## 2017-04-27 DIAGNOSIS — E785 Hyperlipidemia, unspecified: Secondary | ICD-10-CM | POA: Diagnosis present

## 2017-04-27 DIAGNOSIS — I482 Chronic atrial fibrillation: Secondary | ICD-10-CM | POA: Diagnosis present

## 2017-04-27 DIAGNOSIS — Z992 Dependence on renal dialysis: Secondary | ICD-10-CM | POA: Diagnosis not present

## 2017-04-27 DIAGNOSIS — N186 End stage renal disease: Secondary | ICD-10-CM | POA: Diagnosis present

## 2017-04-27 DIAGNOSIS — Z7901 Long term (current) use of anticoagulants: Secondary | ICD-10-CM

## 2017-04-27 DIAGNOSIS — E875 Hyperkalemia: Secondary | ICD-10-CM | POA: Diagnosis present

## 2017-04-27 DIAGNOSIS — M109 Gout, unspecified: Secondary | ICD-10-CM | POA: Diagnosis present

## 2017-04-27 DIAGNOSIS — E1122 Type 2 diabetes mellitus with diabetic chronic kidney disease: Secondary | ICD-10-CM | POA: Diagnosis present

## 2017-04-27 DIAGNOSIS — N2581 Secondary hyperparathyroidism of renal origin: Secondary | ICD-10-CM | POA: Diagnosis present

## 2017-04-27 DIAGNOSIS — D631 Anemia in chronic kidney disease: Secondary | ICD-10-CM | POA: Diagnosis present

## 2017-04-27 DIAGNOSIS — I120 Hypertensive chronic kidney disease with stage 5 chronic kidney disease or end stage renal disease: Secondary | ICD-10-CM | POA: Diagnosis present

## 2017-04-27 LAB — POTASSIUM: Potassium: 6.9 mmol/L (ref 3.5–5.1)

## 2017-04-27 LAB — GLUCOSE, CAPILLARY
GLUCOSE-CAPILLARY: 148 mg/dL — AB (ref 65–99)
GLUCOSE-CAPILLARY: 97 mg/dL (ref 65–99)
Glucose-Capillary: 103 mg/dL — ABNORMAL HIGH (ref 65–99)
Glucose-Capillary: 57 mg/dL — ABNORMAL LOW (ref 65–99)
Glucose-Capillary: 112 mg/dL — ABNORMAL HIGH (ref 65–99)

## 2017-04-27 LAB — ABO/RH: ABO/RH(D): B POS

## 2017-04-27 SURGERY — LAPAROSCOPIC INSERTION CONTINUOUS AMBULATORY PERITONEAL DIALYSIS  (CAPD) CATHETER
Anesthesia: General

## 2017-04-27 MED ORDER — INSULIN ASPART 100 UNIT/ML ~~LOC~~ SOLN: 0.0000 [IU] | Freq: Three times a day (TID) | SUBCUTANEOUS | Status: DC

## 2017-04-27 MED ORDER — SODIUM CHLORIDE 0.9 % IV SOLN
1.0000 g | Freq: Once | INTRAVENOUS | Status: DC
  Filled 2017-04-27: qty 10

## 2017-04-27 MED ORDER — MUPIROCIN 2 % EX OINT
TOPICAL_OINTMENT | Freq: Two times a day (BID) | CUTANEOUS | Status: DC
  Administered 2017-04-27 – 2017-04-28 (×3): via NASAL
  Filled 2017-04-27: qty 22

## 2017-04-27 MED ORDER — EPHEDRINE SULFATE 50 MG/ML IJ SOLN
INTRAMUSCULAR | Status: AC
  Filled 2017-04-27: qty 1

## 2017-04-27 MED ORDER — INSULIN ASPART 100 UNIT/ML IV SOLN
10.0000 [IU] | Freq: Once | INTRAVENOUS | Status: DC
  Filled 2017-04-27: qty 0.1

## 2017-04-27 MED ORDER — CHLORHEXIDINE GLUCONATE CLOTH 2 % EX PADS: 6.0000 | MEDICATED_PAD | Freq: Once | CUTANEOUS | Status: DC

## 2017-04-27 MED ORDER — DEXAMETHASONE SODIUM PHOSPHATE 10 MG/ML IJ SOLN
INTRAMUSCULAR | Status: AC
  Filled 2017-04-27: qty 1

## 2017-04-27 MED ORDER — LABETALOL HCL 100 MG PO TABS
300.0000 mg | ORAL_TABLET | Freq: Three times a day (TID) | ORAL | Status: DC
  Administered 2017-04-28: 300 mg via ORAL
  Filled 2017-04-27 (×3): qty 3

## 2017-04-27 MED ORDER — GABAPENTIN 100 MG PO CAPS
100.0000 mg | ORAL_CAPSULE | Freq: Three times a day (TID) | ORAL | Status: DC
  Administered 2017-04-28: 100 mg via ORAL
  Filled 2017-04-27: qty 1

## 2017-04-27 MED ORDER — ONDANSETRON HCL 4 MG/2ML IJ SOLN
INTRAMUSCULAR | Status: AC
  Filled 2017-04-27: qty 2

## 2017-04-27 MED ORDER — ACETAMINOPHEN 325 MG PO TABS: 650.0000 mg | ORAL_TABLET | Freq: Four times a day (QID) | ORAL | Status: DC | PRN

## 2017-04-27 MED ORDER — ACETAMINOPHEN 650 MG RE SUPP: 650.0000 mg | Freq: Four times a day (QID) | RECTAL | Status: DC | PRN

## 2017-04-27 MED ORDER — DILTIAZEM HCL ER COATED BEADS 120 MG PO CP24
240.0000 mg | ORAL_CAPSULE | Freq: Every day | ORAL | Status: DC
  Administered 2017-04-28: 240 mg via ORAL
  Filled 2017-04-27: qty 1
  Filled 2017-04-27: qty 2

## 2017-04-27 MED ORDER — ATORVASTATIN CALCIUM 20 MG PO TABS
20.0000 mg | ORAL_TABLET | Freq: Every evening | ORAL | Status: DC
Start: 2017-04-27 — End: 2017-04-28
  Administered 2017-04-27: 20 mg via ORAL
  Filled 2017-04-27: qty 1

## 2017-04-27 MED ORDER — PHENYLEPHRINE HCL 10 MG/ML IJ SOLN
INTRAMUSCULAR | Status: AC
  Filled 2017-04-27: qty 1

## 2017-04-27 MED ORDER — ROCURONIUM BROMIDE 50 MG/5ML IV SOLN
INTRAVENOUS | Status: AC
  Filled 2017-04-27: qty 2

## 2017-04-27 MED ORDER — ONDANSETRON HCL 4 MG PO TABS: 4.0000 mg | ORAL_TABLET | Freq: Four times a day (QID) | ORAL | Status: DC | PRN

## 2017-04-27 MED ORDER — KETOROLAC TROMETHAMINE 30 MG/ML IJ SOLN
INTRAMUSCULAR | Status: AC
  Filled 2017-04-27: qty 1

## 2017-04-27 MED ORDER — HYDRALAZINE HCL 50 MG PO TABS
100.0000 mg | ORAL_TABLET | Freq: Three times a day (TID) | ORAL | Status: DC
  Administered 2017-04-27 – 2017-04-28 (×3): 100 mg via ORAL
  Filled 2017-04-27 (×3): qty 2

## 2017-04-27 MED ORDER — SENNOSIDES-DOCUSATE SODIUM 8.6-50 MG PO TABS: 1.0000 | ORAL_TABLET | Freq: Every evening | ORAL | Status: DC | PRN

## 2017-04-27 MED ORDER — ONDANSETRON HCL 4 MG/2ML IJ SOLN: 4.0000 mg | Freq: Four times a day (QID) | INTRAMUSCULAR | Status: DC | PRN

## 2017-04-27 MED ORDER — FENTANYL CITRATE (PF) 100 MCG/2ML IJ SOLN
INTRAMUSCULAR | Status: AC
  Filled 2017-04-27: qty 2

## 2017-04-27 MED ORDER — LIDOCAINE HCL (PF) 2 % IJ SOLN
INTRAMUSCULAR | Status: AC
  Filled 2017-04-27: qty 10

## 2017-04-27 MED ORDER — PROPOFOL 10 MG/ML IV BOLUS
INTRAVENOUS | Status: AC
  Filled 2017-04-27: qty 20

## 2017-04-27 MED ORDER — SUGAMMADEX SODIUM 200 MG/2ML IV SOLN
INTRAVENOUS | Status: AC
  Filled 2017-04-27: qty 2

## 2017-04-27 MED ORDER — COLCHICINE 0.6 MG PO TABS: 0.6000 mg | ORAL_TABLET | Freq: Every day | ORAL | Status: DC | PRN

## 2017-04-27 MED ORDER — BUPIVACAINE-EPINEPHRINE (PF) 0.25% -1:200000 IJ SOLN
INTRAMUSCULAR | Status: AC
  Filled 2017-04-27: qty 30

## 2017-04-27 MED ORDER — CINACALCET HCL 30 MG PO TABS
30.0000 mg | ORAL_TABLET | Freq: Every day | ORAL | Status: DC
  Administered 2017-04-27: 30 mg via ORAL
  Filled 2017-04-27 (×2): qty 1

## 2017-04-27 MED ORDER — DEXTROSE 50 % IV SOLN
25.0000 mL | Freq: Once | INTRAVENOUS | Status: DC
  Filled 2017-04-27: qty 50

## 2017-04-27 MED ORDER — ALLOPURINOL 100 MG PO TABS
100.0000 mg | ORAL_TABLET | Freq: Every day | ORAL | Status: DC
  Administered 2017-04-28: 100 mg via ORAL
  Filled 2017-04-27 (×2): qty 1

## 2017-04-27 MED ORDER — FAMOTIDINE 20 MG PO TABS
ORAL_TABLET | ORAL | Status: AC
  Administered 2017-04-27: 20 mg via ORAL
  Filled 2017-04-27: qty 1

## 2017-04-27 MED ORDER — FLUTICASONE PROPIONATE 50 MCG/ACT NA SUSP: 1.0000 | Freq: Every day | NASAL | Status: DC | PRN

## 2017-04-27 MED ORDER — LANTHANUM CARBONATE 500 MG PO CHEW
1000.0000 mg | CHEWABLE_TABLET | Freq: Three times a day (TID) | ORAL | Status: DC
  Administered 2017-04-27 – 2017-04-28 (×2): 1000 mg via ORAL
  Filled 2017-04-27 (×4): qty 2

## 2017-04-27 MED ORDER — ACETAMINOPHEN NICU IV SYRINGE 10 MG/ML
INTRAVENOUS | Status: AC
  Filled 2017-04-27: qty 1

## 2017-04-27 MED ORDER — FAMOTIDINE 20 MG PO TABS
20.0000 mg | ORAL_TABLET | Freq: Once | ORAL | Status: AC
  Administered 2017-04-27: 20 mg via ORAL

## 2017-04-27 MED ORDER — SODIUM CHLORIDE 0.9 % IV SOLN
INTRAVENOUS | Status: DC
  Administered 2017-04-27: 09:00:00 via INTRAVENOUS

## 2017-04-27 MED ORDER — MIDAZOLAM HCL 2 MG/2ML IJ SOLN
INTRAMUSCULAR | Status: AC
  Filled 2017-04-27: qty 2

## 2017-04-27 MED ORDER — VANCOMYCIN HCL IN DEXTROSE 1-5 GM/200ML-% IV SOLN
INTRAVENOUS | Status: AC
  Filled 2017-04-27: qty 200

## 2017-04-27 MED ORDER — APIXABAN 2.5 MG PO TABS
2.5000 mg | ORAL_TABLET | Freq: Two times a day (BID) | ORAL | Status: DC
  Administered 2017-04-27 – 2017-04-28 (×2): 2.5 mg via ORAL
  Filled 2017-04-27 (×3): qty 1

## 2017-04-27 SURGICAL SUPPLY — 37 items
ADAPTER BETA CAP QUINTON DIALY (ADAPTER) ×3 IMPLANT
ADAPTER CATH DIALYSIS 18.75 (CATHETERS) IMPLANT
ADAPTER CATH DIALYSIS 18.75CM (CATHETERS)
BLADE SURG 15 STRL LF DISP TIS (BLADE) ×1 IMPLANT
BLADE SURG 15 STRL SS (BLADE) ×2
CANISTER SUCT 1200ML W/VALVE (MISCELLANEOUS) ×3 IMPLANT
CATH DLYS SWAN NECK 62.5CM (CATHETERS) IMPLANT
CHLORAPREP W/TINT 26ML (MISCELLANEOUS) ×3 IMPLANT
DERMABOND ADVANCED (GAUZE/BANDAGES/DRESSINGS) ×2
DERMABOND ADVANCED .7 DNX12 (GAUZE/BANDAGES/DRESSINGS) ×1 IMPLANT
ELECT REM PT RETURN 9FT ADLT (ELECTROSURGICAL) ×3
ELECTRODE REM PT RTRN 9FT ADLT (ELECTROSURGICAL) ×1 IMPLANT
GAUZE SPONGE 4X4 12PLY STRL (GAUZE/BANDAGES/DRESSINGS) ×3 IMPLANT
GLOVE BIO SURGEON STRL SZ7 (GLOVE) ×3 IMPLANT
GLOVE INDICATOR 7.5 STRL GRN (GLOVE) ×3 IMPLANT
GLOVE SURG SYN 8.0 (GLOVE) ×3 IMPLANT
GOWN STRL REUS W/ TWL LRG LVL3 (GOWN DISPOSABLE) ×2 IMPLANT
GOWN STRL REUS W/ TWL XL LVL3 (GOWN DISPOSABLE) ×1 IMPLANT
GOWN STRL REUS W/TWL LRG LVL3 (GOWN DISPOSABLE) ×4
GOWN STRL REUS W/TWL XL LVL3 (GOWN DISPOSABLE) ×2
KIT RM TURNOVER STRD PROC AR (KITS) ×3 IMPLANT
LABEL OR SOLS (LABEL) ×3 IMPLANT
MINICAP W/POVIDONE IODINE SOL (MISCELLANEOUS) ×3 IMPLANT
NEEDLE HYPO 25X1 1.5 SAFETY (NEEDLE) ×3 IMPLANT
NEEDLE INSUFFLATION 150MM (ENDOMECHANICALS) ×3 IMPLANT
NS IRRIG 500ML POUR BTL (IV SOLUTION) ×3 IMPLANT
PACK LAP CHOLECYSTECTOMY (MISCELLANEOUS) ×3 IMPLANT
PENCIL ELECTRO HAND CTR (MISCELLANEOUS) ×3 IMPLANT
SET TRANSFER 6 W/TWIST CLAMP 5 (SET/KITS/TRAYS/PACK) ×3 IMPLANT
SLEEVE ENDOPATH XCEL 5M (ENDOMECHANICALS) IMPLANT
SUT MNCRL AB 4-0 PS2 18 (SUTURE) ×3 IMPLANT
SUT VIC AB 3-0 SH 27 (SUTURE) ×4
SUT VIC AB 3-0 SH 27X BRD (SUTURE) ×2 IMPLANT
SUT VICRYL 0 AB UR-6 (SUTURE) ×6 IMPLANT
SYR 50ML LL SCALE MARK (SYRINGE) ×3 IMPLANT
TROCAR XCEL NON-BLD 5MMX100MML (ENDOMECHANICALS) ×3 IMPLANT
TUBING INSUFFLATOR HI FLOW (MISCELLANEOUS) ×3 IMPLANT

## 2017-04-27 NOTE — H&P (Signed)
[facility] History & Physical Update  The patient was interviewed and re-examined.  The patient's previous History and Physical has been reviewed and is unchanged.  There is no change in the plan of care. We plan to proceed with the scheduled procedure.  Hortencia Pilar, MD  04/27/2017, 7:36 AM

## 2017-04-27 NOTE — Anesthesia Preprocedure Evaluation (Addendum)
Anesthesia Evaluation  Patient identified by MRN, date of birth, ID band Patient awake    Reviewed: Allergy & Precautions, NPO status , Patient's Chart, lab work & pertinent test results, reviewed documented beta blocker date and time   Airway Mallampati: III  TM Distance: >3 FB     Dental  (+) Chipped, Upper Dentures   Pulmonary           Cardiovascular hypertension, Pt. on medications and Pt. on home beta blockers      Neuro/Psych    GI/Hepatic   Endo/Other  diabetes, Type 2  Renal/GU ESRFRenal disease     Musculoskeletal   Abdominal   Peds  Hematology   Anesthesia Other Findings Gout. Allen's ok.  Reproductive/Obstetrics                            Anesthesia Physical Anesthesia Plan  ASA: III  Anesthesia Plan: General   Post-op Pain Management:    Induction: Intravenous  PONV Risk Score and Plan:   Airway Management Planned: Oral ETT  Additional Equipment:   Intra-op Plan:   Post-operative Plan:   Informed Consent: I have reviewed the patients History and Physical, chart, labs and discussed the procedure including the risks, benefits and alternatives for the proposed anesthesia with the patient or authorized representative who has indicated his/her understanding and acceptance.     Plan Discussed with: CRNA  Anesthesia Plan Comments:         Anesthesia Quick Evaluation

## 2017-04-27 NOTE — Progress Notes (Signed)
Post HD  

## 2017-04-27 NOTE — H&P (Signed)
[facility] [place] NAME: Stacy Pham    MR#:  [id number]  DATE OF BIRTH:  Nov 17, 1930  DATE OF ADMISSION:  04/27/2017  PRIMARY CARE PHYSICIAN: Patient, No Pcp Per   REQUESTING/REFERRING PHYSICIAN: Dr schnier  CHIEF COMPLAINT:   Elevated K HISTORY OF PRESENT ILLNESS:  Stacy Pham  is a 83 y.o. female with a known history of hypertension,, diabetes, end-stage renal disease on hemodialysis was seen in preop area for elevated potassium of 6.9 today. Patient was here for PD catheter placement. Procedure has been canceled. She is being admitted for urgent hemodialysis. IV dextrose, insulin and calcium gluconate have been ordered. Patient is asymptomatic.  PAST MEDICAL HISTORY:   Past Medical History:  Diagnosis Date  . Diabetes mellitus without complication ([place])   . Dialysis patient ([place])    Mon-Wed-Friday  . Hypertension   . Renal failure     PAST SURGICAL HISTOIRY:   Past Surgical History:  Procedure Laterality Date  . DIALYSIS/PERMA CATHETER INSERTION N/A 03/18/2017   Procedure: DIALYSIS/PERMA CATHETER INSERTION;  Surgeon: Katha Cabal, MD;  Location: [facility];  Service: Cardiovascular;  Laterality: N/A;    SOCIAL HISTORY:   Social History  Substance Use Topics  . Smoking status: Never Smoker  . Smokeless tobacco: Never Used  . Alcohol use No    FAMILY HISTORY:  History reviewed. No pertinent family history.  DRUG ALLERGIES:  No Known Allergies  REVIEW OF SYSTEMS:  Review of Systems  Constitutional: Negative for chills, fever and weight loss.  HENT: Negative for ear discharge, ear pain and nosebleeds.   Eyes: Negative for blurred vision, pain and discharge.  Respiratory: Negative for sputum production, shortness of breath, wheezing and stridor.   Cardiovascular: Negative for chest pain, palpitations, orthopnea and PND.  Gastrointestinal: Negative for abdominal pain, diarrhea, nausea and  vomiting.  Genitourinary: Negative for frequency and urgency.  Musculoskeletal: Negative for back pain and joint pain.  Neurological: Negative for sensory change, speech change, focal weakness and weakness.  Psychiatric/Behavioral: Negative for depression and hallucinations. The patient is not nervous/anxious.      MEDICATIONS AT HOME:   Prior to Admission medications   Medication Sig Start Date End Date Taking? Authorizing Provider  allopurinol (ZYLOPRIM) 100 MG tablet Take 100 mg by mouth daily.   Yes [provider]  apixaban (ELIQUIS) 2.5 MG TABS tablet Take 2.5 mg by mouth 2 (two) times daily.   Yes [provider]  atorvastatin (LIPITOR) 20 MG tablet Take 20 mg by mouth every evening.    Yes [provider]  colchicine 0.6 MG tablet Take 0.6 mg by mouth daily as needed.    Yes [provider]  diltiazem (DILACOR XR) 240 MG 24 hr capsule Take 240 mg by mouth daily.   Yes [provider]  fluticasone (FLONASE) 50 MCG/ACT nasal spray Place 1 spray into both nostrils daily as needed for allergies or rhinitis.   Yes [provider]  gabapentin (NEURONTIN) 100 MG capsule Take 100 mg by mouth 3 (three) times daily.   Yes [provider]  hydrALAZINE (APRESOLINE) 100 MG tablet Take 100 mg by mouth 3 (three) times daily.   Yes [provider]  labetalol (NORMODYNE) 300 MG tablet Take 300 mg by mouth 3 (three) times daily.   Yes [provider]  losartan (COZAAR) 100 MG tablet Take 100 mg by mouth daily.   Yes [provider]  VITAL SIGNS:  Blood pressure (!) 126/58, pulse (!) 59, temperature (!) 96.4 F (35.8 C), temperature source Tympanic, resp. rate 16, height 5\' 2"  (1.575 m), weight 77.5 kg (170 lb 12.8 oz), SpO2 99 %.  PHYSICAL EXAMINATION:  GENERAL:  83 y.o.-year-old patient lying in the bed with no acute distress.  EYES: Pupils equal, round, reactive to light and accommodation. No scleral  icterus. Extraocular muscles intact.  HEENT: Head atraumatic, normocephalic. Oropharynx and nasopharynx clear.  NECK:  Supple, no jugular venous distention. No thyroid enlargement, no tenderness.  LUNGS: Normal breath sounds bilaterally, no wheezing, rales,rhonchi or crepitation. No use of accessory muscles of respiration. HD cath + on left upper chest CARDIOVASCULAR: S1, S2 normal. No murmurs, rubs, or gallops.  ABDOMEN: Soft, nontender, nondistended. Bowel sounds present. No organomegaly or mass.  EXTREMITIES: No pedal edema, cyanosis, or clubbing.  NEUROLOGIC: Cranial nerves II through XII are intact. Muscle strength 5/5 in all extremities. Sensation intact. Gait not checked.  PSYCHIATRIC: The patient is alert and oriented x 3.  SKIN: No obvious rash, lesion, or ulcer.   LABORATORY PANEL:   CBC  Recent Labs Lab 04/22/17 1425  WBC 6.0  HGB 13.5  HCT 41.2  PLT 291   ------------------------------------------------------------------------------------------------------------------  Chemistries   Recent Labs Lab 04/22/17 1425 04/27/17 0653  NA 136  --   K 5.2* 6.9*  CL 95*  --   CO2 24  --   GLUCOSE 86  --   BUN 41*  --   CREATININE 7.96*  --   CALCIUM 10.0  --    ------------------------------------------------------------------------------------------------------------------  Cardiac Enzymes No results for input(s): TROPONINI in the last 168 hours. ------------------------------------------------------------------------------------------------------------------  RADIOLOGY:  No results found.  EKG:  S bradycardia. No acute T waves  IMPRESSION AND PLAN:   Stacy Pham  is a 83 y.o. female with a known history of hypertension,, diabetes, end-stage renal disease on hemodialysis was seen in preop area for elevated potassium of 6.9 today. Patient was here for PD catheter placement. Procedure has been canceled. She is being admitted for urgent hemodialysis.  1.Acute  hyperkalemia -admit to medical floor -Telemetry -IV calcium gluconate, dextrose, insulin ordered -Urgent hemodialysis. Discussed with Dr. Juleen China who is making arrangements.  2. Chronic A. Fib -EKG shows sinus rhythm -Patient on oral anticoagulation  3. Hypertension -Resume home meds -hold losartan given elevated potassium  4. Diabetes -sliding-scale insulin -Patient not on any meds at home  5. Hyperlipidemia -Continue Lipitor  6. End-stage renal disease -On hemodialysis Monday Wednesday Friday  7. DVT prophylaxis -Continue oral anticoagulation  Above was discussed with patient,daughter, Dr. Delana Meyer and nephrology   All the records are reviewed and case discussed with ED provider. Management plans discussed with the patient, family and they are in agreement.  CODE STATUS:full  TOTAL TIME TAKING CARE OF THIS PATIENT: 50 minutes.    Stacy Pham M.D on 04/27/2017 at 8:21 AM  Between 7am to 6pm - Pager - [phone]  After 6pm go to [email or web address] - password [email or web address] [facility]  Office  [phone]  CC: Primary care physician; Patient, No Pcp Per

## 2017-04-27 NOTE — OR Nursing (Signed)
Per Dr Delana Meyer cancel procedure due to potassium being 6.9.

## 2017-04-27 NOTE — Progress Notes (Signed)
[facility]  ROUNDING NOTE   Subjective:   [name] admitted to [facility] on 04/27/2017 for ESRD  Patient was scheduled for peritoneal dialysis catheter placement today by Dr. Delana Meyer. Unfortunately, patient found to have a serum potassium of 6.9. She was admitted for hyperkalemia.   Patient admits to eating potatoes at home.   Placed on hemodialysis. Tolerating treatment well.   Objective:  Vital signs in last 24 hours:  Temp:  [96.4 F (35.8 C)-97.8 F (36.6 C)] 97.8 F (36.6 C) (10/17 1115) Pulse Rate:  [53-59] 53 (10/17 1130) Resp:  [16-18] 18 (10/17 1130) BP: (121-129)/(47-58) 121/47 (10/17 1130) SpO2:  [97 %-99 %] 97 % (10/17 0915) Weight:  [77.5 kg (170 lb 12.8 oz)-81.4 kg (179 lb 7.3 oz)] 81.4 kg (179 lb 7.3 oz) (10/17 1115)  Weight change:  Filed Weights   04/27/17 0612 04/27/17 1115  Weight: 77.5 kg (170 lb 12.8 oz) 81.4 kg (179 lb 7.3 oz)    Intake/Output: No intake/output data recorded.   Intake/Output this shift:  No intake/output data recorded.  Physical Exam: General: NAD  Head: Normocephalic, atraumatic. Moist oral mucosal membranes  Eyes: Anicteric, PERRL  Neck: Supple, trachea midline  Lungs:  Clear to auscultation  Heart: Regular rate and rhythm  Abdomen:  Soft, nontender,   Extremities: no peripheral edema.  Neurologic: Nonfocal, moving all four extremities  Skin: No lesions  Access: LIJ permcath    Basic Metabolic Panel:  Recent Labs Lab 04/22/17 1425 04/27/17 0653  NA 136  --   K 5.2* 6.9*  CL 95*  --   CO2 24  --   GLUCOSE 86  --   BUN 41*  --   CREATININE 7.96*  --   CALCIUM 10.0  --     Liver Function Tests: No results for input(s): AST, ALT, ALKPHOS, BILITOT, PROT, ALBUMIN in the last 168 hours. No results for input(s): LIPASE, AMYLASE in the last 168 hours. No results for input(s): AMMONIA in the last 168 hours.  CBC:  Recent Labs Lab 04/22/17 1425  WBC 6.0  NEUTROABS 3.9  HGB 13.5  HCT 41.2   MCV 100.5*  PLT 291    Cardiac Enzymes: No results for input(s): CKTOTAL, CKMB, CKMBINDEX, TROPONINI in the last 168 hours.  BNP: Invalid input(s): POCBNP  CBG:  Recent Labs Lab 04/27/17 0617  GLUCAP 103*    Microbiology: Results for orders placed or performed during the hospital encounter of 04/22/17  Surgical pcr screen     Status: Abnormal   Collection Time: 04/22/17  2:42 PM  Result Value Ref Range Status   MRSA, PCR POSITIVE (A) NEGATIVE Final    Comment: RESULT CALLED TO, READ BACK BY AND VERIFIED WITH: Callie Fielding @ 1443 04/25/17 [facility]    Staphylococcus aureus POSITIVE (A) NEGATIVE Final    Comment: (NOTE) The Xpert SA Assay (FDA approved for NASAL specimens in patients 67 years of age and older), is one component of a comprehensive surveillance program. It is not intended to diagnose infection nor to guide or monitor treatment.     Coagulation Studies: No results for input(s): LABPROT, INR in the last 72 hours.  Urinalysis: No results for input(s): COLORURINE, LABSPEC, PHURINE, GLUCOSEU, HGBUR, BILIRUBINUR, KETONESUR, PROTEINUR, UROBILINOGEN, NITRITE, LEUKOCYTESUR in the last 72 hours.  Invalid input(s): APPERANCEUR    Imaging: No results found.   Medications:   . calcium gluconate    . vancomycin     . allopurinol  100 mg Oral Daily  .  apixaban  2.5 mg Oral BID  . atorvastatin  20 mg Oral QPM  . dextrose  25 mL Intravenous Once  . [START ON 04/28/2017] diltiazem  240 mg Oral Daily  . [START ON 04/28/2017] gabapentin  100 mg Oral TID  . hydrALAZINE  100 mg Oral TID  . insulin aspart  0-9 Units Subcutaneous TID WC  . insulin aspart  10 Units Intravenous Once  . [START ON 04/28/2017] labetalol  300 mg Oral TID   acetaminophen **OR** acetaminophen, colchicine, fluticasone, ondansetron **OR** ondansetron (ZOFRAN) IV, senna-docusate  Assessment/ Plan:  [name] is a 83 y.o. black female with end stage renal disease on hemodialysis,  hypertension, diabetes mellitus type II, gout, hyperlipidemia  [facility] MWF [place] LIJ permcath  1. End stage renal disease with hyperkalemia: seen and examined on emergent hemodialysis today. 2 K bath.  - Discussed dietary potassium restriction.  - monitor serum potassium - Resume MWF schedule  2. Hypertension: blood pressure at goal during hemodialysis. Has had difficult to control in past - Currently on diltiazem, hydralazine and labetalol. Holding losartan currently.   3. Anemia of chronic kidney disease: hemoglobin 13.5  - holding EPO  4. Secondary Hyperparathyroidism: with hyperphosphatemia. Outpatient PTH 2009, phos 7.9, calcium 9.6  Difficulty with outpatient control. Hectorol and cinacalcet as outpatient - Fosrenol.    LOS: 0 Stacy Pham 10/17/201811:52 AM

## 2017-04-27 NOTE — Progress Notes (Signed)
tx complete

## 2017-04-27 NOTE — Care Management (Signed)
Case cancelled ssecondary to hyperkalemia.

## 2017-04-27 NOTE — Progress Notes (Signed)
HD STARTED  

## 2017-04-28 LAB — BASIC METABOLIC PANEL
ANION GAP: 14 (ref 5–15)
BUN: 45 mg/dL — ABNORMAL HIGH (ref 6–20)
CHLORIDE: 96 mmol/L — AB (ref 101–111)
CO2: 23 mmol/L (ref 22–32)
Calcium: 8.1 mg/dL — ABNORMAL LOW (ref 8.9–10.3)
Creatinine, Ser: 7.65 mg/dL — ABNORMAL HIGH (ref 0.44–1.00)
GFR calc Af Amer: 5 mL/min — ABNORMAL LOW (ref >60)
GFR calc non Af Amer: 4 mL/min — ABNORMAL LOW (ref >60)
Glucose, Bld: 108 mg/dL — ABNORMAL HIGH (ref 65–99)
POTASSIUM: 5 mmol/L (ref 3.5–5.1)
Sodium: 133 mmol/L — ABNORMAL LOW (ref 135–145)

## 2017-04-28 LAB — GLUCOSE, CAPILLARY
GLUCOSE-CAPILLARY: 112 mg/dL — AB (ref 65–99)
Glucose-Capillary: 104 mg/dL — ABNORMAL HIGH (ref 65–99)

## 2017-04-28 MED ORDER — LANTHANUM CARBONATE 1000 MG PO CHEW: 1000.0000 mg | CHEWABLE_TABLET | Freq: Three times a day (TID) | ORAL | 1 refills | Status: DC

## 2017-04-28 MED ORDER — CINACALCET HCL 30 MG PO TABS: 30.0000 mg | ORAL_TABLET | Freq: Every day | ORAL | 1 refills | Status: DC

## 2017-04-28 NOTE — Progress Notes (Signed)
Stacy Pham to be D/C'd Home per MD order.  Discussed prescriptions and follow up appointments with the patient. Prescriptions given to patient, medication list explained in detail. Pt verbalized understanding.  Allergies as of 04/28/2017   No Known Allergies     Medication List    TAKE these medications   allopurinol 100 MG tablet Commonly known as:  ZYLOPRIM Take 100 mg by mouth daily.   atorvastatin 20 MG tablet Commonly known as:  LIPITOR Take 20 mg by mouth every evening.   cinacalcet 30 MG tablet Commonly known as:  SENSIPAR Take 1 tablet (30 mg total) by mouth daily with supper.   colchicine 0.6 MG tablet Take 0.6 mg by mouth daily as needed.   diltiazem 240 MG 24 hr capsule Commonly known as:  DILACOR XR Take 240 mg by mouth daily.   ELIQUIS 2.5 MG Tabs tablet Generic drug:  apixaban Take 2.5 mg by mouth 2 (two) times daily.   fluticasone 50 MCG/ACT nasal spray Commonly known as:  FLONASE Place 1 spray into both nostrils daily as needed for allergies or rhinitis.   gabapentin 100 MG capsule Commonly known as:  NEURONTIN Take 100 mg by mouth 3 (three) times daily.   hydrALAZINE 100 MG tablet Commonly known as:  APRESOLINE Take 100 mg by mouth 3 (three) times daily.   labetalol 300 MG tablet Commonly known as:  NORMODYNE Take 300 mg by mouth 3 (three) times daily.   lanthanum 1000 MG chewable tablet Commonly known as:  FOSRENOL Chew 1 tablet (1,000 mg total) by mouth 3 (three) times daily with meals.   losartan 100 MG tablet Commonly known as:  COZAAR Take 100 mg by mouth daily.       Vitals:   04/28/17 0447 04/28/17 0944  BP: (!) 137/54 136/66  Pulse: 64 66  Resp: 19 16  Temp: 98.4 F (36.9 C) 98.2 F (36.8 C)  SpO2: 99% 99%    Skin clean, dry and intact without evidence of skin break down, no evidence of skin tears noted. IV catheter discontinued intact. Site without signs and symptoms of complications. Dressing and pressure applied. Pt  denies pain at this time. No complaints noted.  An After Visit Summary was printed and given to the patient and daughter.  Patient escorted via [place], and D/C home via private auto.  Stacy Pham

## 2017-04-28 NOTE — Discharge Summary (Signed)
[facility] NAME: Stacy Pham    MR#:  [id number]  DATE OF BIRTH:  1930/08/14  DATE OF ADMISSION:  04/27/2017 ADMITTING PHYSICIAN: [name], MD  DATE OF DISCHARGE: 04/28/17  PRIMARY CARE PHYSICIAN: Patient, No Pcp Per    ADMISSION DIAGNOSIS:  ESRD  DISCHARGE DIAGNOSIS:  Acute Hyperkalemia--resolved ESRD on HD  SECONDARY DIAGNOSIS:   Past Medical History:  Diagnosis Date  . Diabetes mellitus without complication ([place])   . Dialysis patient ([place])    Mon-Wed-Friday  . Hypertension   . Renal failure     HOSPITAL COURSE:   Stacy Pham  is a 83 y.o. female with a known history of hypertension,, diabetes, end-stage renal disease on hemodialysis was seen in preop area for elevated potassium of 6.9 today. Patient was here for PD catheter placement. Procedure has been canceled. She is being admitted for urgent hemodialysis.  1.Acute hyperkalemia -received IV calcium gluconate, dextrose, insulin  -Urgent hemodialysis doe y'day.  -K is 5.0  2. Chronic A. Fib -EKG shows sinus rhythm -Patient on oral anticoagulation  3. Hypertension -Resume home meds  4. Diabetes -sliding-scale insulin -Patient not on any meds at home  5. Hyperlipidemia -Continue Lipitor  6. End-stage renal disease -On hemodialysis Monday Wednesday Friday  7. DVT prophylaxis -Continue oral anticoagulation  Overall doing well D/c home today. OK with Dr Juleen China  CONSULTS OBTAINED:  Treatment Team:  [name], MD  DRUG ALLERGIES:  No Known Allergies  DISCHARGE MEDICATIONS:   Current Discharge Medication List    START taking these medications   Details  cinacalcet (SENSIPAR) 30 MG tablet Take 1 tablet (30 mg total) by mouth daily with supper. Qty: 60 tablet, Refills: 1    lanthanum (FOSRENOL) 1000 MG chewable tablet Chew 1 tablet (1,000 mg total) by mouth 3 (three) times daily with meals. Qty: 90 tablet, Refills: 1       CONTINUE these medications which have NOT CHANGED   Details  allopurinol (ZYLOPRIM) 100 MG tablet Take 100 mg by mouth daily.    apixaban (ELIQUIS) 2.5 MG TABS tablet Take 2.5 mg by mouth 2 (two) times daily.    atorvastatin (LIPITOR) 20 MG tablet Take 20 mg by mouth every evening.     colchicine 0.6 MG tablet Take 0.6 mg by mouth daily as needed.     diltiazem (DILACOR XR) 240 MG 24 hr capsule Take 240 mg by mouth daily.    fluticasone (FLONASE) 50 MCG/ACT nasal spray Place 1 spray into both nostrils daily as needed for allergies or rhinitis.    gabapentin (NEURONTIN) 100 MG capsule Take 100 mg by mouth 3 (three) times daily.    hydrALAZINE (APRESOLINE) 100 MG tablet Take 100 mg by mouth 3 (three) times daily.    labetalol (NORMODYNE) 300 MG tablet Take 300 mg by mouth 3 (three) times daily.    losartan (COZAAR) 100 MG tablet Take 100 mg by mouth daily.        If you experience worsening of your admission symptoms, develop shortness of breath, life threatening emergency, suicidal or homicidal thoughts you must seek medical attention immediately by calling 911 or calling your MD immediately  if symptoms less severe.  You Must read complete instructions/literature along with all the possible adverse reactions/side effects for all the Medicines you take and that have been prescribed to you. Take any new Medicines after you have completely understood and accept all the possible adverse reactions/side effects.  Please note  You were cared for by a hospitalist during your hospital stay. If you have any questions about your discharge medications or the care you received while you were in the hospital after you are discharged, you can call the unit and asked to speak with the hospitalist on call if the hospitalist that took care of you is not available. Once you are discharged, your primary care physician will handle any further medical issues. Please note that NO REFILLS for any  discharge medications will be authorized once you are discharged, as it is imperative that you return to your primary care physician (or establish a relationship with a primary care physician if you do not have one) for your aftercare needs so that they can reassess your need for medications and monitor your lab values. Today   SUBJECTIVE   No complaints  VITAL SIGNS:  Blood pressure 136/66, pulse 66, temperature 98.2 F (36.8 C), temperature source Oral, resp. rate 16, height 5\' 2"  (1.575 m), weight 81.4 kg (179 lb 7.3 oz), SpO2 99 %.  I/O:   Intake/Output Summary (Last 24 hours) at 04/28/17 1109 Last data filed at 04/28/17 0447  Gross per 24 hour  Intake                0 ml  Output             2250 ml  Net            -2250 ml    PHYSICAL EXAMINATION:  GENERAL:  83 y.o.-year-old patient lying in the bed with no acute distress.  EYES: Pupils equal, round, reactive to light and accommodation. No scleral icterus. Extraocular muscles intact.  HEENT: Head atraumatic, normocephalic. Oropharynx and nasopharynx clear.  NECK:  Supple, no jugular venous distention. No thyroid enlargement, no tenderness.  LUNGS: Normal breath sounds bilaterally, no wheezing, rales,rhonchi or crepitation. No use of accessory muscles of respiration. HD perm cath + left upper chest CARDIOVASCULAR: S1, S2 normal. No murmurs, rubs, or gallops.  ABDOMEN: Soft, non-tender, non-distended. Bowel sounds present. No organomegaly or mass.  EXTREMITIES: No pedal edema, cyanosis, or clubbing.  NEUROLOGIC: Cranial nerves II through XII are intact. Muscle strength 5/5 in all extremities. Sensation intact. Gait not checked.  PSYCHIATRIC: The patient is alert and oriented x 3.  SKIN: No obvious rash, lesion, or ulcer.   DATA REVIEW:   CBC   Recent Labs Lab 04/22/17 1425  WBC 6.0  HGB 13.5  HCT 41.2  PLT 291    Chemistries   Recent Labs Lab 04/28/17 0535  NA 133*  K 5.0  CL 96*  CO2 23  GLUCOSE 108*  BUN  45*  CREATININE 7.65*  CALCIUM 8.1*    Microbiology Results   Recent Results (from the past 240 hour(s))  Surgical pcr screen     Status: Abnormal   Collection Time: 04/22/17  2:42 PM  Result Value Ref Range Status   MRSA, PCR POSITIVE (A) NEGATIVE Final    Comment: RESULT CALLED TO, READ BACK BY AND VERIFIED WITH: Callie Fielding @ 0865 04/25/17 [facility]    Staphylococcus aureus POSITIVE (A) NEGATIVE Final    Comment: (NOTE) The Xpert SA Assay (FDA approved for NASAL specimens in patients 67 years of age and older), is one component of a comprehensive surveillance program. It is not intended to diagnose infection nor to guide or monitor treatment.     RADIOLOGY:  No results found.   Management plans discussed with the patient,  family and they are in agreement.  CODE STATUS:     Code Status Orders        Start     Ordered   04/27/17 0913  Full code  Continuous     04/27/17 0912    Code Status History    Date Active Date Inactive Code Status Order ID Comments User Context   This patient has a current code status but no historical code status.      TOTAL TIME TAKING CARE OF THIS PATIENT: *40* minutes.    Chrstopher Malenfant M.D on 04/28/2017 at 11:09 AM  Between 7am to 6pm - Pager - [phone] After 6pm go to [email or web address] - password [email or web address] [facility]  Office  [phone]  CC: Primary care physician; Patient, No Pcp Per

## 2017-04-28 NOTE — Progress Notes (Signed)
[facility]  ROUNDING NOTE   Subjective:   Hemodialysis treatment yesterday for hyperkalemia. 2 K bath. UF of 1 liter. Tolerated treatment well.   Objective:  Vital signs in last 24 hours:  Temp:  [97.8 F (36.6 C)-98.9 F (37.2 C)] 98.2 F (36.8 C) (10/18 0944) Pulse Rate:  [52-66] 66 (10/18 0944) Resp:  [12-21] 16 (10/18 0944) BP: (84-142)/(41-82) 136/66 (10/18 0944) SpO2:  [97 %-100 %] 99 % (10/18 0944) Weight:  [81.4 kg (179 lb 7.3 oz)] 81.4 kg (179 lb 7.3 oz) (10/17 1115)  Weight change: 3.926 kg (8 lb 10.5 oz) Filed Weights   04/27/17 0612 04/27/17 1115  Weight: 77.5 kg (170 lb 12.8 oz) 81.4 kg (179 lb 7.3 oz)    Intake/Output: I/O last 3 completed shifts: In: -  Out: 2250 [Urine:250; Other:2000]   Intake/Output this shift:  No intake/output data recorded.  Physical Exam: General: NAD  Head: Normocephalic, atraumatic. Moist oral mucosal membranes  Eyes: Anicteric, PERRL  Neck: Supple, trachea midline  Lungs:  Clear to auscultation  Heart: Regular rate and rhythm  Abdomen:  Soft, nontender,   Extremities: no peripheral edema.  Neurologic: Nonfocal, moving all four extremities  Skin: No lesions  Access: LIJ permcath    Basic Metabolic Panel:  Recent Labs Lab 04/22/17 1425 04/27/17 0653 04/28/17 0535  NA 136  --  133*  K 5.2* 6.9* 5.0  CL 95*  --  96*  CO2 24  --  23  GLUCOSE 86  --  108*  BUN 41*  --  45*  CREATININE 7.96*  --  7.65*  CALCIUM 10.0  --  8.1*    Liver Function Tests: No results for input(s): AST, ALT, ALKPHOS, BILITOT, PROT, ALBUMIN in the last 168 hours. No results for input(s): LIPASE, AMYLASE in the last 168 hours. No results for input(s): AMMONIA in the last 168 hours.  CBC:  Recent Labs Lab 04/22/17 1425  WBC 6.0  NEUTROABS 3.9  HGB 13.5  HCT 41.2  MCV 100.5*  PLT 291    Cardiac Enzymes: No results for input(s): CKTOTAL, CKMB, CKMBINDEX, TROPONINI in the last 168 hours.  BNP: Invalid input(s):  POCBNP  CBG:  Recent Labs Lab 04/27/17 1522 04/27/17 1600 04/27/17 1736 04/27/17 2110 04/28/17 0738  GLUCAP 57* 148* 112* 97 104*    Microbiology: Results for orders placed or performed during the hospital encounter of 04/22/17  Surgical pcr screen     Status: Abnormal   Collection Time: 04/22/17  2:42 PM  Result Value Ref Range Status   MRSA, PCR POSITIVE (A) NEGATIVE Final    Comment: RESULT CALLED TO, READ BACK BY AND VERIFIED WITH: Callie Fielding @ 1191 04/25/17 [facility]    Staphylococcus aureus POSITIVE (A) NEGATIVE Final    Comment: (NOTE) The Xpert SA Assay (FDA approved for NASAL specimens in patients 22 years of age and older), is one component of a comprehensive surveillance program. It is not intended to diagnose infection nor to guide or monitor treatment.     Coagulation Studies: No results for input(s): LABPROT, INR in the last 72 hours.  Urinalysis: No results for input(s): COLORURINE, LABSPEC, PHURINE, GLUCOSEU, HGBUR, BILIRUBINUR, KETONESUR, PROTEINUR, UROBILINOGEN, NITRITE, LEUKOCYTESUR in the last 72 hours.  Invalid input(s): APPERANCEUR    Imaging: No results found.   Medications:   . calcium gluconate Stopped (04/27/17 0815)   . allopurinol  100 mg Oral Daily  . apixaban  2.5 mg Oral BID  . atorvastatin  20 mg  Oral QPM  . cinacalcet  30 mg Oral Q supper  . dextrose  25 mL Intravenous Once  . diltiazem  240 mg Oral Daily  . gabapentin  100 mg Oral TID  . hydrALAZINE  100 mg Oral TID  . insulin aspart  0-9 Units Subcutaneous TID WC  . insulin aspart  10 Units Intravenous Once  . labetalol  300 mg Oral TID  . lanthanum  1,000 mg Oral TID WC  . mupirocin ointment   Nasal BID   acetaminophen **OR** acetaminophen, colchicine, fluticasone, ondansetron **OR** ondansetron (ZOFRAN) IV, senna-docusate  Assessment/ Plan:  [name] is a 83 y.o. black female with end stage renal disease on hemodialysis, hypertension, diabetes mellitus type II,  gout, hyperlipidemia  [facility] MWF [place]. LIJ permcath  1. End stage renal disease with hyperkalemia:emergent hemodialysis yesterday. 2 K bath. Potassium now at goal.  - Discussed dietary potassium restriction. No more potatoes and orange juice - Resume MWF schedule  2. Hypertension: blood pressure at goal during hemodialysis. Has had difficult to control in past - Currently on diltiazem, hydralazine and labetalol. Holding losartan due to hyper.   3. Anemia of chronic kidney disease: hemoglobin 13.5  - holding EPO  4. Secondary Hyperparathyroidism: with hyperphosphatemia. Outpatient PTH 2009, phos 7.9, calcium 9.6  Difficulty with outpatient control. Hectorol and cinacalcet as outpatient - Fosrenol.    LOS: 1 Stacy Pham 10/18/201810:23 AM

## 2017-04-28 NOTE — Care Management (Signed)
Stacy Pham HD liaison notified of discharge.

## 2017-04-28 NOTE — Discharge Instructions (Signed)
Resume your HD as before °

## 2017-04-28 NOTE — Care Management Important Message (Signed)
Important Message  Patient Details  Name: Stacy Pham MRN: [id number] Date of Birth: Oct 21, 1930   Medicare Important Message Given:  N/A - LOS <3 / Initial given by admissions    Beverly Sessions, RN 04/28/2017, 2:09 PM

## 2017-05-06 ENCOUNTER — Other Ambulatory Visit
Admission: RE | Admit: 2017-05-06 | Discharge: 2017-05-06 | Disposition: A | Discharge status: Home / Self Care | Payer: Medicare Other | Source: Ambulatory Visit | Attending: Nephrology | Admitting: Nephrology

## 2017-05-06 ENCOUNTER — Emergency Department
Admission: EM | Admit: 2017-05-06 | Discharge: 2017-05-06 | Disposition: A | Discharge status: Home / Self Care | Payer: Medicare Other | Attending: Emergency Medicine | Admitting: Emergency Medicine

## 2017-05-06 DIAGNOSIS — T82898A Other specified complication of vascular prosthetic devices, implants and grafts, initial encounter: Secondary | ICD-10-CM | POA: Insufficient documentation

## 2017-05-06 DIAGNOSIS — N186 End stage renal disease: Secondary | ICD-10-CM | POA: Insufficient documentation

## 2017-05-06 DIAGNOSIS — Z789 Other specified health status: Secondary | ICD-10-CM

## 2017-05-06 DIAGNOSIS — Z992 Dependence on renal dialysis: Secondary | ICD-10-CM | POA: Diagnosis not present

## 2017-05-06 DIAGNOSIS — E1122 Type 2 diabetes mellitus with diabetic chronic kidney disease: Secondary | ICD-10-CM | POA: Diagnosis not present

## 2017-05-06 DIAGNOSIS — Y718 Miscellaneous cardiovascular devices associated with adverse incidents, not elsewhere classified: Secondary | ICD-10-CM | POA: Diagnosis not present

## 2017-05-06 DIAGNOSIS — Z7901 Long term (current) use of anticoagulants: Secondary | ICD-10-CM | POA: Insufficient documentation

## 2017-05-06 DIAGNOSIS — Z79899 Other long term (current) drug therapy: Secondary | ICD-10-CM | POA: Insufficient documentation

## 2017-05-06 LAB — BASIC METABOLIC PANEL
ANION GAP: 17 — AB (ref 5–15)
BUN: 97 mg/dL — ABNORMAL HIGH (ref 6–20)
CO2: 24 mmol/L (ref 22–32)
Calcium: 7.8 mg/dL — ABNORMAL LOW (ref 8.9–10.3)
Chloride: 96 mmol/L — ABNORMAL LOW (ref 101–111)
Creatinine, Ser: 10.7 mg/dL — ABNORMAL HIGH (ref 0.44–1.00)
GFR calc Af Amer: 3 mL/min — ABNORMAL LOW (ref >60)
GFR calc non Af Amer: 3 mL/min — ABNORMAL LOW (ref >60)
GLUCOSE: 123 mg/dL — AB (ref 65–99)
Potassium: 5.3 mmol/L — ABNORMAL HIGH (ref 3.5–5.1)
Sodium: 137 mmol/L (ref 135–145)

## 2017-05-06 LAB — POTASSIUM: Potassium: 5.7 mmol/L — ABNORMAL HIGH (ref 3.5–5.1)

## 2017-05-06 NOTE — ED Notes (Signed)
Electronic signature pad not working at this time. Patient given discharge and follow up instructions. Patient verbalizes understanding.

## 2017-05-06 NOTE — ED Provider Notes (Signed)
[facility] Emergency Department Provider Note  ____________________________________________   I have reviewed the triage vital signs and the nursing notes.   HISTORY  Chief Complaint Vascular Access Problem   History limited by: Not Limited   HPI Stacy Pham is a 83 y.o. female who presents to the emergency department today because of inability to undergo dialysis due to clotted vascular access.   LOCATION:left chest DURATION:discovered today SEVERITY: unable to undergo dialysis CONTEXT: patient receives dialysis MWF. States she had her session MW without problem. Could not have dialysis today because of clot. Sent to the ER. MODIFYING FACTORS: none ASSOCIATED SYMPTOMS: no shortness of breath. No swelling.  Per medical record review patient has a history of dialysis.  Past Medical History:  Diagnosis Date  . Diabetes mellitus without complication ([place])   . Dialysis patient ([place])    Mon-Wed-Friday  . Hypertension   . Renal failure     Patient Active Problem List   Diagnosis Date Noted  . Acute hyperkalemia 04/27/2017    Past Surgical History:  Procedure Laterality Date  . DIALYSIS/PERMA CATHETER INSERTION N/A 03/18/2017   Procedure: DIALYSIS/PERMA CATHETER INSERTION;  Surgeon: Katha Cabal, MD;  Location: [place] [facility];  Service: Cardiovascular;  Laterality: N/A;    Prior to Admission medications   Medication Sig Start Date End Date Taking? Authorizing Provider  allopurinol (ZYLOPRIM) 100 MG tablet Take 100 mg by mouth daily.    [provider]  apixaban (ELIQUIS) 2.5 MG TABS tablet Take 2.5 mg by mouth 2 (two) times daily.    [provider]  atorvastatin (LIPITOR) 20 MG tablet Take 20 mg by mouth every evening.     [provider]  cinacalcet (SENSIPAR) 30 MG tablet Take 1 tablet (30 mg total) by mouth daily with supper. 04/28/17   [name], MD  colchicine 0.6 MG tablet Take 0.6 mg by mouth  daily as needed.     [provider]  diltiazem (DILACOR XR) 240 MG 24 hr capsule Take 240 mg by mouth daily.    [provider]  fluticasone (FLONASE) 50 MCG/ACT nasal spray Place 1 spray into both nostrils daily as needed for allergies or rhinitis.    [provider]  gabapentin (NEURONTIN) 100 MG capsule Take 100 mg by mouth 3 (three) times daily.    [provider]  hydrALAZINE (APRESOLINE) 100 MG tablet Take 100 mg by mouth 3 (three) times daily.    [provider]  labetalol (NORMODYNE) 300 MG tablet Take 300 mg by mouth 3 (three) times daily.    [provider]  lanthanum (FOSRENOL) 1000 MG chewable tablet Chew 1 tablet (1,000 mg total) by mouth 3 (three) times daily with meals. 04/28/17   [name], MD  losartan (COZAAR) 100 MG tablet Take 100 mg by mouth daily.    [provider]    Allergies Patient has no known allergies.  No family history on file.  Social History Social History  Substance Use Topics  . Smoking status: Never Smoker  . Smokeless tobacco: Never Used  . Alcohol use No    Review of Systems Constitutional: No fever/chills Eyes: No visual changes. ENT: No sore throat. Cardiovascular: Denies chest pain. Respiratory: Denies shortness of breath. Gastrointestinal: No abdominal pain.  No nausea, no vomiting.  No diarrhea.   Genitourinary: Negative for dysuria. Musculoskeletal: Negative for back pain. Skin: Negative for rash. Neurological: Negative for headaches, focal weakness or numbness.  ____________________________________________   PHYSICAL  EXAM:  VITAL SIGNS: ED Triage Vitals  Enc Vitals Group     BP 05/06/17 1750 (!) 168/56     Pulse Rate 05/06/17 1750 78     Resp 05/06/17 1750 18     Temp 05/06/17 1750 98.5 F (36.9 C)     Temp Source 05/06/17 1750 Oral     SpO2 05/06/17 1750 96 %     Weight 05/06/17 1751 179 lb (81.2 kg)     Height 05/06/17 1751 5\' 2"  (1.575 m)    Constitutional: Alert and oriented. Well appearing and in no distress. Eyes: Conjunctivae are normal.  ENT   Head: Normocephalic and atraumatic.   Nose: No congestion/rhinnorhea.   Mouth/Throat: Mucous membranes are moist.   Neck: No stridor. Hematological/Lymphatic/Immunilogical: No cervical lymphadenopathy. Cardiovascular: Normal rate, regular rhythm.   Respiratory: Normal respiratory effort without tachypnea nor retractions. Breath sounds are clear and equal bilaterally. No wheezes/rales/rhonchi. Gastrointestinal: Soft and non tender. No rebound. No guarding.  Genitourinary: Deferred Musculoskeletal: Normal range of motion in all extremities. No lower extremity edema. Neurologic:  Normal speech and language. No gross focal neurologic deficits are appreciated.  Skin:  Skin is warm, dry and intact. No rash noted. Psychiatric: Mood and affect are normal. Speech and behavior are normal. Patient exhibits appropriate insight and judgment.  ____________________________________________    LABS (pertinent positives/negatives)  K 5.3  ____________________________________________   EKG  None  ____________________________________________    RADIOLOGY  None  ____________________________________________   PROCEDURES  Procedures  ____________________________________________   INITIAL IMPRESSION / ASSESSMENT AND PLAN / ED COURSE  Pertinent labs & imaging results that were available during my care of the patient were reviewed by me and considered in my medical decision making (see chart for details).  Patient presented to the emergency department today from dialysis center because of clotted vascular access. Potassium very minimally elevated at 5.3. Patient not symptomatic for fluid overload. I discussed with the nephrologist Dr. Candiss Norse. He recommended that the patient be discharged home. He recommended that the patient discussed with her dialysis center who can  help coordinate with vascular surgery for replacement. I discussed this and the plan with patient and family.  ____________________________________________   FINAL CLINICAL IMPRESSION(S) / ED DIAGNOSES  Final diagnoses:  Problem with vascular access     Note: This dictation was prepared with Dragon dictation. Any transcriptional errors that result from this process are unintentional     Nance Pear, MD 05/06/17 [id number]

## 2017-05-06 NOTE — Discharge Instructions (Signed)
Please seek medical attention for any high fevers, chest pain, shortness of breath, change in behavior, persistent vomiting, bloody stool or any other new or concerning symptoms.  

## 2017-05-06 NOTE — ED Triage Notes (Signed)
Pt states that she went for her dialysis today and they were unable to get her vascular access to work, pt denies pain or difficulty breathing

## 2017-05-07 ENCOUNTER — Observation Stay: Admission: AD | Admit: 2017-05-07 | Payer: Medicare Other | Source: Ambulatory Visit | Admitting: Internal Medicine

## 2017-05-09 ENCOUNTER — Other Ambulatory Visit (INDEPENDENT_AMBULATORY_CARE_PROVIDER_SITE_OTHER): Payer: Self-pay | Admitting: Vascular Surgery

## 2017-05-09 ENCOUNTER — Ambulatory Visit
Admission: RE | Admit: 2017-05-09 | Discharge: 2017-05-09 | Disposition: A | Discharge status: Home / Self Care | Payer: Medicare Other | Source: Ambulatory Visit | Attending: Vascular Surgery | Admitting: Vascular Surgery

## 2017-05-09 DIAGNOSIS — N186 End stage renal disease: Secondary | ICD-10-CM | POA: Insufficient documentation

## 2017-05-09 MED ORDER — SODIUM CHLORIDE 0.9 % IV SOLN
5.0000 mg | Freq: Once | INTRAVENOUS | Status: DC
  Administered 2017-05-09: 5 mg via INTRAVENOUS
  Filled 2017-05-09: qty 5

## 2017-05-09 MED ORDER — SODIUM CHLORIDE 0.9 % IV SOLN
5.0000 mg | Freq: Once | INTRAVENOUS | Status: DC
  Filled 2017-05-09: qty 5

## 2017-05-09 MED ORDER — SODIUM CHLORIDE FLUSH 0.9 % IV SOLN
INTRAVENOUS | Status: AC
Start: 2017-05-09 — End: 2017-05-09
  Filled 2017-05-09: qty 20

## 2017-05-16 ENCOUNTER — Ambulatory Visit (INDEPENDENT_AMBULATORY_CARE_PROVIDER_SITE_OTHER): Payer: Medicare Other | Admitting: Vascular Surgery

## 2017-05-18 ENCOUNTER — Encounter: Payer: Self-pay | Admitting: Family Medicine

## 2017-05-18 ENCOUNTER — Ambulatory Visit: Payer: Medicare Other | Admitting: Family Medicine

## 2017-05-18 VITALS — BP 160/60 | HR 66 | Temp 97.8°F | Ht 62.0 in | Wt 173.5 lb

## 2017-05-18 DIAGNOSIS — E1122 Type 2 diabetes mellitus with diabetic chronic kidney disease: Secondary | ICD-10-CM | POA: Diagnosis not present

## 2017-05-18 DIAGNOSIS — I4891 Unspecified atrial fibrillation: Secondary | ICD-10-CM | POA: Diagnosis not present

## 2017-05-18 DIAGNOSIS — I10 Essential (primary) hypertension: Secondary | ICD-10-CM | POA: Diagnosis not present

## 2017-05-18 DIAGNOSIS — Z7689 Persons encountering health services in other specified circumstances: Secondary | ICD-10-CM | POA: Diagnosis not present

## 2017-05-18 DIAGNOSIS — Z992 Dependence on renal dialysis: Secondary | ICD-10-CM

## 2017-05-18 DIAGNOSIS — Z8614 Personal history of Methicillin resistant Staphylococcus aureus infection: Secondary | ICD-10-CM

## 2017-05-18 DIAGNOSIS — N186 End stage renal disease: Secondary | ICD-10-CM | POA: Diagnosis not present

## 2017-05-18 LAB — HEMOGLOBIN A1C: HEMOGLOBIN A1C: 5.7 % (ref 4.6–6.5)

## 2017-05-18 NOTE — Patient Instructions (Signed)
It was a pleasure to meet you today! I look forward to partnering with you for your health care needs   You will get a call to schedule a Medicare Annual Wellness Visit  Please follow up in 6 months

## 2017-05-18 NOTE — Progress Notes (Signed)
Subjective:    Patient ID: Stacy Pham, female    DOB: 01/22/1931, 83 y.o.   MRN: [id number]  HPI This is an 83 yo female who presents today to establish care. Her daughter was with her for part of the visit. She is originally from [place], she lived in [place] for 51 years, moved to [place] 2 months ago and she lives with her daughter and SIL and grandchildren.   Ambulation- she uses a walker and is able to walk short distances.   Renal failure- She has an appointment with vascular surgery next week for PD port placement consult. Has been on dialysis since 7/18. Has left subclavian port. No pain or drainage. Energy improved since she started dialysis.   HTN- checks BP at home. Daughter concerned that diastolic runs in 93Y. Systolic typically 101-751.   DM- doesn't check blood sugars since they are checked at dialysis  MRSA- was in hospital with problems related to dialysis catheter access and hyperkalemia a couple of weeks ago and was found to have MRSA in nares. Has been using ointment (assumed to be Mupirocin). Daughter is concerned and would like her mother to be retested.    Past Medical History:  Diagnosis Date  . Diabetes mellitus without complication ([place])   . Dialysis patient ([place])    Mon-Wed-Friday  . Hypertension   . Renal failure    No past surgical history on file. Family History  Problem Relation Age of Onset  . Hypertension Father    Social History   Tobacco Use  . Smoking status: Never Smoker  . Smokeless tobacco: Never Used  Substance Use Topics  . Alcohol use: No  . Drug use: No        Review of Systems  Constitutional: Negative for fatigue.  HENT: Positive for rhinorrhea (clear). Negative for sore throat.   Eyes:       Occasional eye redness.   Respiratory: Negative for cough and shortness of breath.   Cardiovascular: Negative for chest pain and leg swelling.  Gastrointestinal: Negative for abdominal pain, constipation and diarrhea.    Genitourinary: Positive for difficulty urinating (small amount urine made). Negative for dysuria and hematuria.  Musculoskeletal: Positive for back pain (with a lot of walking, resolved with rest).  Psychiatric/Behavioral: Positive for sleep disturbance (frequent awakening). Negative for dysphoric mood.       Objective:   Physical Exam  Constitutional: She is oriented to person, place, and time. She appears well-developed and well-nourished.  HENT:  Head: Normocephalic and atraumatic.  Eyes: Right eye exhibits no discharge. Left eye exhibits no discharge. Right conjunctiva is injected. Left conjunctiva is injected.  Cardiovascular: Normal rate, regular rhythm and normal heart sounds.  Pulmonary/Chest: Effort normal and breath sounds normal.  Musculoskeletal: She exhibits no edema.  Neurological: She is alert and oriented to person, place, and time.  Skin: Skin is warm and dry.  Psychiatric: She has a normal mood and affect. Her behavior is normal. Judgment and thought content normal.  Vitals reviewed.     BP (!) 172/58 (BP Location: Right Arm, Patient Position: Sitting, Cuff Size: Normal)   Pulse 66   Temp 97.8 F (36.6 C) (Oral)   Ht 5\' 2"  (1.575 m)   Wt 173 lb 8 oz (78.7 kg)   SpO2 97%   BMI 31.73 kg/m  Wt Readings from Last 3 Encounters:  05/18/17 173 lb 8 oz (78.7 kg)  05/09/17 179 lb (81.2 kg)  05/06/17 179 lb (81.2 kg)  Depression screen PHQ 2/9 05/18/2017  Decreased Interest 0  Down, Depressed, Hopeless 0  PHQ - 2 Score 0   BP recheck- 160/60    Assessment & Plan:  1. Encounter to establish care - will obtain records from previous providers  2. Stage 5 chronic kidney disease on chronic dialysis ([place]) - continue dialysis  3. Type 2 diabetes mellitus with chronic kidney disease on chronic dialysis, without long-term current use of insulin (HCC) - Hemoglobin A1c  4. Essential hypertension - blood pressure elevated today, was down some with recheck.  Daughter will monitor at home and notify office if systolic consistently over 140  5. History of MRSA infection - Aerobic Culture  6. Atrial fibrillation, unspecified type ([place]) - Regular rhythm today, continue Eliquis  - follow up in 6 months for AWV/CPE  Clarene Reamer, FNP-BC  [facility], [place]  05/21/2017 6:38 AM

## 2017-05-21 ENCOUNTER — Encounter: Payer: Self-pay | Admitting: Family Medicine

## 2017-05-21 DIAGNOSIS — E1122 Type 2 diabetes mellitus with diabetic chronic kidney disease: Secondary | ICD-10-CM | POA: Insufficient documentation

## 2017-05-21 DIAGNOSIS — E1159 Type 2 diabetes mellitus with other circulatory complications: Secondary | ICD-10-CM | POA: Insufficient documentation

## 2017-05-21 DIAGNOSIS — N186 End stage renal disease: Secondary | ICD-10-CM | POA: Insufficient documentation

## 2017-05-21 DIAGNOSIS — I10 Essential (primary) hypertension: Secondary | ICD-10-CM | POA: Insufficient documentation

## 2017-05-21 DIAGNOSIS — I152 Hypertension secondary to endocrine disorders: Secondary | ICD-10-CM | POA: Insufficient documentation

## 2017-05-21 DIAGNOSIS — Z992 Dependence on renal dialysis: Secondary | ICD-10-CM

## 2017-05-21 DIAGNOSIS — I480 Paroxysmal atrial fibrillation: Secondary | ICD-10-CM | POA: Insufficient documentation

## 2017-05-21 LAB — NASAL CULTURE (N/P)
MICRO NUMBER:: 81257559
RESULT:: NORMAL
SPECIMEN QUALITY: ADEQUATE

## 2017-05-26 ENCOUNTER — Encounter (INDEPENDENT_AMBULATORY_CARE_PROVIDER_SITE_OTHER): Payer: Self-pay | Admitting: Vascular Surgery

## 2017-05-26 ENCOUNTER — Ambulatory Visit (INDEPENDENT_AMBULATORY_CARE_PROVIDER_SITE_OTHER): Payer: Medicare Other | Admitting: Vascular Surgery

## 2017-05-26 VITALS — BP 119/61 | HR 79 | Resp 17 | Ht 62.5 in | Wt 172.0 lb

## 2017-05-26 DIAGNOSIS — I4891 Unspecified atrial fibrillation: Secondary | ICD-10-CM

## 2017-05-26 DIAGNOSIS — I10 Essential (primary) hypertension: Secondary | ICD-10-CM | POA: Diagnosis not present

## 2017-05-26 DIAGNOSIS — N186 End stage renal disease: Secondary | ICD-10-CM | POA: Diagnosis not present

## 2017-05-26 DIAGNOSIS — Z992 Dependence on renal dialysis: Secondary | ICD-10-CM | POA: Diagnosis not present

## 2017-05-26 DIAGNOSIS — E1122 Type 2 diabetes mellitus with diabetic chronic kidney disease: Secondary | ICD-10-CM

## 2017-05-26 DIAGNOSIS — E875 Hyperkalemia: Secondary | ICD-10-CM

## 2017-05-27 ENCOUNTER — Encounter (INDEPENDENT_AMBULATORY_CARE_PROVIDER_SITE_OTHER): Payer: Self-pay | Admitting: Vascular Surgery

## 2017-05-27 ENCOUNTER — Other Ambulatory Visit (INDEPENDENT_AMBULATORY_CARE_PROVIDER_SITE_OTHER): Payer: Self-pay

## 2017-05-27 NOTE — Progress Notes (Signed)
MRN : [id number]  Stacy Pham is a 83 y.o. (1931-02-04) female who presents with chief complaint of  Chief Complaint  Patient presents with  . Follow-up    2 week [facility] f/u PD cath placement  .  History of Present Illness:  The patient is seen for evaluation of dialysis access.  She remains committed to dialysis via PD  The patient denies amaurosis fugax or recent TIA symptoms. There are no recent neurological changes noted. The patient denies claudication symptoms or rest pain symptoms. The patient denies history of DVT, PE or superficial thrombophlebitis. The patient denies recent episodes of angina or shortness of breath.    No outpatient medications have been marked as taking for the 05/26/17 encounter (Office Visit) with Delana Meyer, Dolores Lory, MD.    Past Medical History:  Diagnosis Date  . Diabetes mellitus without complication ([place])   . Dialysis patient ([place])    Mon-Wed-Friday  . Hypertension   . Renal failure     Past Surgical History:  Procedure Laterality Date  . DIALYSIS/PERMA CATHETER INSERTION N/A 03/18/2017   Performed by Katha Cabal, MD at [facility]  . LAPAROSCOPIC INSERTION CONTINUOUS AMBULATORY PERITONEAL DIALYSIS  (CAPD) CATHETER N/A 04/27/2017   Performed by Delana Meyer Dolores Lory, MD at [facility]    Social History Social History   Tobacco Use  . Smoking status: Never Smoker  . Smokeless tobacco: Never Used  Substance Use Topics  . Alcohol use: No  . Drug use: No    Family History Family History  Problem Relation Age of Onset  . Hypertension Father   No family history of bleeding/clotting disorders, porphyria or autoimmune disease   No Known Allergies   REVIEW OF SYSTEMS (Negative unless checked)  Constitutional: [] Weight loss  [] Fever  [] Chills Cardiac: [] Chest pain   [] Chest pressure   [] Palpitations   [] Shortness of breath when laying flat   [] Shortness of breath with exertion. Vascular:  [] Pain in legs with walking    [] Pain in legs at rest  [] History of DVT   [] Phlebitis   [] Swelling in legs   [] Varicose veins   [] Non-healing ulcers Pulmonary:   [] Uses home oxygen   [] Productive cough   [] Hemoptysis   [] Wheeze  [] COPD   [] Asthma Neurologic:  [] Dizziness   [] Seizures   [] History of stroke   [] History of TIA  [] Aphasia   [] Vissual changes   [] Weakness or numbness in arm   [] Weakness or numbness in leg Musculoskeletal:   [] Joint swelling   [] Joint pain   [] Low back pain Hematologic:  [] Easy bruising  [] Easy bleeding   [] Hypercoagulable state   [] Anemic Gastrointestinal:  [] Diarrhea   [] Vomiting  [] Gastroesophageal reflux/heartburn   [] Difficulty swallowing. Genitourinary:  [x] Chronic kidney disease   [] Difficult urination  [] Frequent urination   [] Blood in urine Skin:  [] Rashes   [] Ulcers  Psychological:  [] History of anxiety   []  History of major depression.  Physical Examination  Vitals:   05/26/17 1323  BP: 119/61  Pulse: 79  Resp: 17  Weight: 78 kg (172 lb)  Height: 5' 2.5" (1.588 m)   Body mass index is 30.96 kg/m. Gen: WD/WN, NAD Head: [place]/AT, No temporalis wasting.  Ear/Nose/Throat: Hearing grossly intact, nares w/o erythema or drainage, poor dentition Eyes: PER, EOMI, sclera nonicteric.  Neck: Supple, no masses.  No bruit or JVD.  Pulmonary:  Good air movement, clear to auscultation bilaterally, no use of accessory muscles.  Cardiac: RRR, normal S1, S2, no Murmurs.  Vascular:  Vessel Right Left  Radial Palpable Palpable  PT Palpable Palpable  DP Palpable Palpable  Gastrointestinal: soft, non-distended. No guarding/no peritoneal signs.  Musculoskeletal: M/S 5/5 throughout.  No deformity or atrophy.  Neurologic: CN 2-12 intact. Pain and light touch intact in extremities.  Symmetrical.  Speech is fluent. Motor exam as listed above. Psychiatric: Judgment intact, Mood & affect appropriate for pt's clinical situation. Dermatologic: No rashes or ulcers noted.  No changes consistent with  cellulitis. Lymph : No Cervical lymphadenopathy, no lichenification or skin changes of chronic lymphedema.  CBC Lab Results  Component Value Date   WBC 6.0 04/22/2017   HGB 13.5 04/22/2017   HCT 41.2 04/22/2017   MCV 100.5 (H) 04/22/2017   PLT 291 04/22/2017    BMET    Component Value Date/Time   NA 137 05/06/2017 1820   K 5.3 (H) 05/06/2017 1820   CL 96 (L) 05/06/2017 1820   CO2 24 05/06/2017 1820   GLUCOSE 123 (H) 05/06/2017 1820   BUN 97 (H) 05/06/2017 1820   CREATININE 10.70 (H) 05/06/2017 1820   CALCIUM 7.8 (L) 05/06/2017 1820   GFRNONAA 3 (L) 05/06/2017 1820   GFRAA 3 (L) 05/06/2017 1820   CrCl cannot be calculated (Patient's most recent lab result is older than the maximum 21 days allowed.).  COAG Lab Results  Component Value Date   INR 1.09 04/22/2017   INR 1.22 03/18/2017    Radiology No results found.    Assessment/Plan 1. Stage 5 chronic kidney disease on chronic dialysis [facility]) Recommend:  The patient has advanced renal disease but currently does not have dialysis access.  The patient has elected to move forward with peritoneal dialysis. There is no history of prior abdominal operations, thus this does not appear at this time to be a prohibitive situation.  This was also discussed.  Risk and benefits were reviewed the patient.  Indications for the procedure were reviewed.  All questions were answered, the patient agrees to proceed with laparoscopic PD catheter insertion.  I will arrange with Dr. Holley Raring to check her potassium level 3 days prior to the surgery so that he can adjust her potassium as needed.   A total of 20 minutes was spent with this patient and her daughter and greater than 50% was spent in counseling and coordination of care with the patient.  Discussion included the treatment options for dialysis access and perioperative management.   2. Acute hyperkalemia I will check her potassium level prior to surgery to avoid same day  cancellation  3. Type 2 diabetes mellitus with chronic kidney disease on chronic dialysis, without long-term current use of insulin (HCC) Continue hypoglycemic medications as already ordered, these medications have been reviewed and there are no changes at this time.  Hgb A1C to be monitored as already arranged by primary service   4. Atrial fibrillation, unspecified type ([place]) Continue antiarrhythmia medications as already ordered, these medications have been reviewed and there are no changes at this time.  Continue anticoagulation as ordered by Cardiology Service   5. Essential hypertension Continue antihypertensive medications as already ordered, these medications have been reviewed and there are no changes at this time.     Hortencia Pilar, MD  05/27/2017 8:29 PM

## 2017-05-30 ENCOUNTER — Other Ambulatory Visit (INDEPENDENT_AMBULATORY_CARE_PROVIDER_SITE_OTHER): Payer: Self-pay | Admitting: Vascular Surgery

## 2017-05-30 MED ORDER — ALTEPLASE 2 MG IJ SOLR: 2.0000 mg | Freq: Once | INTRAMUSCULAR | Status: DC

## 2017-05-31 ENCOUNTER — Other Ambulatory Visit (INDEPENDENT_AMBULATORY_CARE_PROVIDER_SITE_OTHER): Payer: Self-pay | Admitting: Vascular Surgery

## 2017-05-31 ENCOUNTER — Ambulatory Visit
Admission: RE | Admit: 2017-05-31 | Discharge: 2017-05-31 | Disposition: A | Discharge status: Home / Self Care | Payer: Medicare Other | Source: Ambulatory Visit | Attending: Vascular Surgery | Admitting: Vascular Surgery

## 2017-05-31 DIAGNOSIS — T8241XA Breakdown (mechanical) of vascular dialysis catheter, initial encounter: Secondary | ICD-10-CM | POA: Diagnosis not present

## 2017-05-31 DIAGNOSIS — Y838 Other surgical procedures as the cause of abnormal reaction of the patient, or of later complication, without mention of misadventure at the time of the procedure: Secondary | ICD-10-CM | POA: Diagnosis not present

## 2017-05-31 DIAGNOSIS — N186 End stage renal disease: Secondary | ICD-10-CM | POA: Insufficient documentation

## 2017-05-31 MED ORDER — SODIUM CHLORIDE 0.9 % IV SOLN
5.0000 mg | Freq: Once | INTRAVENOUS | Status: AC
  Administered 2017-05-31: 5 mg via INTRAVENOUS
  Filled 2017-05-31: qty 5

## 2017-06-05 ENCOUNTER — Other Ambulatory Visit: Payer: Self-pay | Admitting: Family Medicine

## 2017-06-07 ENCOUNTER — Other Ambulatory Visit (INDEPENDENT_AMBULATORY_CARE_PROVIDER_SITE_OTHER): Payer: Self-pay | Admitting: Vascular Surgery

## 2017-06-07 ENCOUNTER — Ambulatory Visit: Payer: Medicare Other

## 2017-06-07 ENCOUNTER — Telehealth (INDEPENDENT_AMBULATORY_CARE_PROVIDER_SITE_OTHER): Payer: Self-pay | Admitting: Vascular Surgery

## 2017-06-07 MED ORDER — CEFAZOLIN SODIUM-DEXTROSE 1-4 GM/50ML-% IV SOLN: 1.0000 g | Freq: Once | INTRAVENOUS | Status: DC

## 2017-06-07 NOTE — Telephone Encounter (Signed)
Jenny Reichmann says that due to the patient being rescheduled so many different times, that she has duplicate orders sitting in her file and she just needs you to take a look and delete the duplicates.

## 2017-06-07 NOTE — Telephone Encounter (Signed)
New Message  Cindy from same-day surgery is wanting kim to review and delete orders for pt due to pt has cancelled/rescheduled numerous of times.  Cindy verbalized wanting kim to delete orders that are not needed.

## 2017-06-07 NOTE — Telephone Encounter (Signed)
I see two different orders. One for TPA and one for insertion of a peritoneal dialysis catheter.

## 2017-06-08 ENCOUNTER — Ambulatory Visit: Payer: Medicare Other | Admitting: Anesthesiology

## 2017-06-08 ENCOUNTER — Encounter: Payer: Medicare Other | Admitting: Family Medicine

## 2017-06-08 ENCOUNTER — Ambulatory Visit
Admission: RE | Admit: 2017-06-08 | Discharge: 2017-06-08 | Disposition: A | Discharge status: Home / Self Care | Payer: Medicare Other | Source: Ambulatory Visit | Attending: Vascular Surgery | Admitting: Vascular Surgery

## 2017-06-08 ENCOUNTER — Encounter
Admission: RE | Disposition: A | Discharge status: Home / Self Care | Payer: Self-pay | Source: Ambulatory Visit | Attending: Vascular Surgery

## 2017-06-08 ENCOUNTER — Encounter: Payer: Self-pay | Admitting: *Deleted

## 2017-06-08 DIAGNOSIS — I120 Hypertensive chronic kidney disease with stage 5 chronic kidney disease or end stage renal disease: Secondary | ICD-10-CM | POA: Insufficient documentation

## 2017-06-08 DIAGNOSIS — Z8614 Personal history of Methicillin resistant Staphylococcus aureus infection: Secondary | ICD-10-CM | POA: Insufficient documentation

## 2017-06-08 DIAGNOSIS — Z992 Dependence on renal dialysis: Secondary | ICD-10-CM | POA: Diagnosis not present

## 2017-06-08 DIAGNOSIS — I4891 Unspecified atrial fibrillation: Secondary | ICD-10-CM | POA: Diagnosis not present

## 2017-06-08 DIAGNOSIS — Z79899 Other long term (current) drug therapy: Secondary | ICD-10-CM | POA: Insufficient documentation

## 2017-06-08 DIAGNOSIS — Z7902 Long term (current) use of antithrombotics/antiplatelets: Secondary | ICD-10-CM | POA: Insufficient documentation

## 2017-06-08 DIAGNOSIS — N186 End stage renal disease: Secondary | ICD-10-CM | POA: Insufficient documentation

## 2017-06-08 DIAGNOSIS — E1122 Type 2 diabetes mellitus with diabetic chronic kidney disease: Secondary | ICD-10-CM | POA: Diagnosis not present

## 2017-06-08 HISTORY — PX: CAPD INSERTION: SHX5233

## 2017-06-08 LAB — BASIC METABOLIC PANEL
Anion gap: 18 — ABNORMAL HIGH (ref 5–15)
BUN: 73 mg/dL — AB (ref 6–20)
CALCIUM: 9.2 mg/dL (ref 8.9–10.3)
CO2: 23 mmol/L (ref 22–32)
CREATININE: 12.05 mg/dL — AB (ref 0.44–1.00)
Chloride: 99 mmol/L — ABNORMAL LOW (ref 101–111)
GFR calc Af Amer: 3 mL/min — ABNORMAL LOW (ref >60)
GFR, EST NON AFRICAN AMERICAN: 2 mL/min — AB (ref >60)
Glucose, Bld: 104 mg/dL — ABNORMAL HIGH (ref 65–99)
POTASSIUM: 4.3 mmol/L (ref 3.5–5.1)
SODIUM: 140 mmol/L (ref 135–145)

## 2017-06-08 LAB — PROTIME-INR
INR: 1.08
PROTHROMBIN TIME: 13.9 s (ref 11.4–15.2)

## 2017-06-08 LAB — CBC WITH DIFFERENTIAL/PLATELET
BASOS ABS: 0 10*3/uL (ref 0–0.1)
BASOS PCT: 1 %
EOS ABS: 0.1 10*3/uL (ref 0–0.7)
EOS PCT: 1 %
HCT: 39.4 % (ref 35.0–47.0)
Hemoglobin: 12.6 g/dL (ref 12.0–16.0)
Lymphocytes Relative: 21 %
Lymphs Abs: 1.5 10*3/uL (ref 1.0–3.6)
MCH: 32.1 pg (ref 26.0–34.0)
MCHC: 31.9 g/dL — ABNORMAL LOW (ref 32.0–36.0)
MCV: 100.5 fL — ABNORMAL HIGH (ref 80.0–100.0)
Monocytes Absolute: 0.8 10*3/uL (ref 0.2–0.9)
Monocytes Relative: 11 %
Neutro Abs: 4.8 10*3/uL (ref 1.4–6.5)
Neutrophils Relative %: 66 %
PLATELETS: 290 10*3/uL (ref 150–440)
RBC: 3.93 MIL/uL (ref 3.80–5.20)
RDW: 16.2 % — AB (ref 11.5–14.5)
WBC: 7.3 10*3/uL (ref 3.6–11.0)

## 2017-06-08 LAB — POCT I-STAT 4, (NA,K, GLUC, HGB,HCT)
Glucose, Bld: 100 mg/dL — ABNORMAL HIGH (ref 65–99)
HEMATOCRIT: 40 % (ref 36.0–46.0)
HEMOGLOBIN: 13.6 g/dL (ref 12.0–15.0)
Potassium: 4.3 mmol/L (ref 3.5–5.1)
Sodium: 140 mmol/L (ref 135–145)

## 2017-06-08 LAB — TYPE AND SCREEN
ABO/RH(D): B POS
ANTIBODY SCREEN: NEGATIVE

## 2017-06-08 LAB — GLUCOSE, CAPILLARY
GLUCOSE-CAPILLARY: 107 mg/dL — AB (ref 65–99)
Glucose-Capillary: 108 mg/dL — ABNORMAL HIGH (ref 65–99)

## 2017-06-08 LAB — APTT: APTT: 33 s (ref 24–36)

## 2017-06-08 SURGERY — LAPAROSCOPIC INSERTION CONTINUOUS AMBULATORY PERITONEAL DIALYSIS  (CAPD) CATHETER
Anesthesia: General | Site: Abdomen | Wound class: Clean

## 2017-06-08 MED ORDER — LIDOCAINE HCL (PF) 2 % IJ SOLN
INTRAMUSCULAR | Status: AC
  Filled 2017-06-08: qty 10

## 2017-06-08 MED ORDER — PROPOFOL 10 MG/ML IV BOLUS
INTRAVENOUS | Status: DC | PRN
Start: 2017-06-08 — End: 2017-06-08
  Administered 2017-06-08: 100 mg via INTRAVENOUS

## 2017-06-08 MED ORDER — GLYCOPYRROLATE 0.2 MG/ML IJ SOLN
INTRAMUSCULAR | Status: AC
  Filled 2017-06-08: qty 3

## 2017-06-08 MED ORDER — CHLORHEXIDINE GLUCONATE CLOTH 2 % EX PADS: 6.0000 | MEDICATED_PAD | Freq: Once | CUTANEOUS | Status: DC

## 2017-06-08 MED ORDER — MIDAZOLAM HCL 2 MG/2ML IJ SOLN
INTRAMUSCULAR | Status: AC
  Filled 2017-06-08: qty 2

## 2017-06-08 MED ORDER — ONDANSETRON HCL 4 MG/2ML IJ SOLN: 4.0000 mg | Freq: Once | INTRAMUSCULAR | Status: DC | PRN

## 2017-06-08 MED ORDER — FENTANYL CITRATE (PF) 100 MCG/2ML IJ SOLN
INTRAMUSCULAR | Status: AC
  Filled 2017-06-08: qty 2

## 2017-06-08 MED ORDER — EPHEDRINE SULFATE 50 MG/ML IJ SOLN
INTRAMUSCULAR | Status: DC | PRN
  Administered 2017-06-08: 15 mg via INTRAVENOUS
  Administered 2017-06-08: 10 mg via INTRAVENOUS
  Administered 2017-06-08: 15 mg via INTRAVENOUS

## 2017-06-08 MED ORDER — ROCURONIUM BROMIDE 100 MG/10ML IV SOLN
INTRAVENOUS | Status: DC | PRN
  Administered 2017-06-08 (×2): 10 mg via INTRAVENOUS

## 2017-06-08 MED ORDER — BUPIVACAINE-EPINEPHRINE (PF) 0.25% -1:200000 IJ SOLN
INTRAMUSCULAR | Status: DC | PRN
  Administered 2017-06-08: 15 mL

## 2017-06-08 MED ORDER — OXYCODONE-ACETAMINOPHEN 5-325 MG PO TABS
1.0000 | ORAL_TABLET | Freq: Once | ORAL | Status: AC
  Administered 2017-06-08: 1 via ORAL

## 2017-06-08 MED ORDER — BUPIVACAINE-EPINEPHRINE (PF) 0.25% -1:200000 IJ SOLN
INTRAMUSCULAR | Status: AC
  Filled 2017-06-08: qty 30

## 2017-06-08 MED ORDER — CEFAZOLIN SODIUM-DEXTROSE 2-4 GM/100ML-% IV SOLN
2.0000 g | INTRAVENOUS | Status: AC
  Administered 2017-06-08: 2 g via INTRAVENOUS

## 2017-06-08 MED ORDER — GLYCOPYRROLATE 0.2 MG/ML IJ SOLN
INTRAMUSCULAR | Status: DC | PRN
  Administered 2017-06-08: 0.6 mg via INTRAVENOUS

## 2017-06-08 MED ORDER — NEOSTIGMINE METHYLSULFATE 10 MG/10ML IV SOLN
INTRAVENOUS | Status: AC
  Filled 2017-06-08: qty 1

## 2017-06-08 MED ORDER — ONDANSETRON HCL 4 MG/2ML IJ SOLN
INTRAMUSCULAR | Status: DC | PRN
  Administered 2017-06-08: 4 mg via INTRAVENOUS

## 2017-06-08 MED ORDER — DEXAMETHASONE SODIUM PHOSPHATE 10 MG/ML IJ SOLN
INTRAMUSCULAR | Status: DC | PRN
  Administered 2017-06-08: 5 mg via INTRAVENOUS

## 2017-06-08 MED ORDER — FENTANYL CITRATE (PF) 100 MCG/2ML IJ SOLN
INTRAMUSCULAR | Status: DC | PRN
  Administered 2017-06-08: 50 ug via INTRAVENOUS
  Administered 2017-06-08 (×2): 25 ug via INTRAVENOUS

## 2017-06-08 MED ORDER — OXYCODONE-ACETAMINOPHEN 5-325 MG PO TABS
ORAL_TABLET | ORAL | Status: AC
  Administered 2017-06-08: 1 via ORAL
  Filled 2017-06-08: qty 1

## 2017-06-08 MED ORDER — PROPOFOL 10 MG/ML IV BOLUS
INTRAVENOUS | Status: AC
  Filled 2017-06-08: qty 20

## 2017-06-08 MED ORDER — LIDOCAINE HCL (CARDIAC) 20 MG/ML IV SOLN
INTRAVENOUS | Status: DC | PRN
  Administered 2017-06-08: 50 mg via INTRAVENOUS

## 2017-06-08 MED ORDER — SUCCINYLCHOLINE CHLORIDE 20 MG/ML IJ SOLN
INTRAMUSCULAR | Status: DC | PRN
  Administered 2017-06-08: 100 mg via INTRAVENOUS

## 2017-06-08 MED ORDER — ONDANSETRON HCL 4 MG/2ML IJ SOLN
INTRAMUSCULAR | Status: AC
  Filled 2017-06-08: qty 2

## 2017-06-08 MED ORDER — FENTANYL CITRATE (PF) 100 MCG/2ML IJ SOLN: 25.0000 ug | INTRAMUSCULAR | Status: DC | PRN

## 2017-06-08 MED ORDER — PHENYLEPHRINE HCL 10 MG/ML IJ SOLN
INTRAMUSCULAR | Status: DC | PRN
  Administered 2017-06-08: 200 ug via INTRAVENOUS
  Administered 2017-06-08: 100 ug via INTRAVENOUS

## 2017-06-08 MED ORDER — NEOSTIGMINE METHYLSULFATE 10 MG/10ML IV SOLN
INTRAVENOUS | Status: DC | PRN
  Administered 2017-06-08: 3 mg via INTRAVENOUS

## 2017-06-08 MED ORDER — DEXAMETHASONE SODIUM PHOSPHATE 10 MG/ML IJ SOLN
INTRAMUSCULAR | Status: AC
  Filled 2017-06-08: qty 1

## 2017-06-08 MED ORDER — OXYCODONE-ACETAMINOPHEN 5-325 MG PO TABS: 1.0000 | ORAL_TABLET | Freq: Four times a day (QID) | ORAL | 0 refills | Status: DC | PRN

## 2017-06-08 MED ORDER — CEFAZOLIN SODIUM-DEXTROSE 2-4 GM/100ML-% IV SOLN
INTRAVENOUS | Status: AC
  Filled 2017-06-08: qty 100

## 2017-06-08 MED ORDER — SODIUM CHLORIDE 0.9 % IV SOLN
INTRAVENOUS | Status: DC
  Administered 2017-06-08: 13:00:00 via INTRAVENOUS

## 2017-06-08 SURGICAL SUPPLY — 38 items
ADAPTER BETA CAP QUINTON DIALY (ADAPTER) ×3 IMPLANT
ADAPTER CATH DIALYSIS 18.75 (CATHETERS) ×2 IMPLANT
ADAPTER CATH DIALYSIS 18.75CM (CATHETERS) ×1
ADAPTER CATH DIALYSIS 4X8 IT L (MISCELLANEOUS) ×3 IMPLANT
BLADE SURG 15 STRL LF DISP TIS (BLADE) ×1 IMPLANT
BLADE SURG 15 STRL SS (BLADE) ×2
CANISTER SUCT 1200ML W/VALVE (MISCELLANEOUS) ×3 IMPLANT
CATH DLYS SWAN NECK 62.5CM (CATHETERS) ×3 IMPLANT
CHLORAPREP W/TINT 26ML (MISCELLANEOUS) ×3 IMPLANT
DERMABOND ADVANCED (GAUZE/BANDAGES/DRESSINGS) ×2
DERMABOND ADVANCED .7 DNX12 (GAUZE/BANDAGES/DRESSINGS) ×1 IMPLANT
ELECT REM PT RETURN 9FT ADLT (ELECTROSURGICAL) ×3
ELECTRODE REM PT RTRN 9FT ADLT (ELECTROSURGICAL) ×1 IMPLANT
GAUZE SPONGE 4X4 12PLY STRL (GAUZE/BANDAGES/DRESSINGS) ×3 IMPLANT
GLOVE BIO SURGEON STRL SZ7 (GLOVE) ×3 IMPLANT
GLOVE INDICATOR 7.5 STRL GRN (GLOVE) ×3 IMPLANT
GLOVE SURG SYN 8.0 (GLOVE) ×3 IMPLANT
GOWN STRL REUS W/ TWL LRG LVL3 (GOWN DISPOSABLE) ×2 IMPLANT
GOWN STRL REUS W/ TWL XL LVL3 (GOWN DISPOSABLE) ×1 IMPLANT
GOWN STRL REUS W/TWL LRG LVL3 (GOWN DISPOSABLE) ×4
GOWN STRL REUS W/TWL XL LVL3 (GOWN DISPOSABLE) ×2
KIT RM TURNOVER STRD PROC AR (KITS) ×3 IMPLANT
LABEL OR SOLS (LABEL) ×3 IMPLANT
MINICAP W/POVIDONE IODINE SOL (MISCELLANEOUS) ×3 IMPLANT
NEEDLE HYPO 25X1 1.5 SAFETY (NEEDLE) ×3 IMPLANT
NEEDLE INSUFFLATION 150MM (ENDOMECHANICALS) ×3 IMPLANT
NS IRRIG 500ML POUR BTL (IV SOLUTION) ×3 IMPLANT
PACK LAP CHOLECYSTECTOMY (MISCELLANEOUS) ×3 IMPLANT
PENCIL ELECTRO HAND CTR (MISCELLANEOUS) ×3 IMPLANT
SET TRANSFER 6 W/TWIST CLAMP 5 (SET/KITS/TRAYS/PACK) ×3 IMPLANT
SLEEVE ENDOPATH XCEL 5M (ENDOMECHANICALS) IMPLANT
SUT MNCRL AB 4-0 PS2 18 (SUTURE) ×3 IMPLANT
SUT VIC AB 3-0 SH 27 (SUTURE) ×6
SUT VIC AB 3-0 SH 27X BRD (SUTURE) ×3 IMPLANT
SUT VICRYL 0 AB UR-6 (SUTURE) ×6 IMPLANT
SYR 50ML LL SCALE MARK (SYRINGE) ×3 IMPLANT
TROCAR XCEL NON-BLD 5MMX100MML (ENDOMECHANICALS) ×3 IMPLANT
TUBING INSUFFLATION (TUBING) ×3 IMPLANT

## 2017-06-08 NOTE — Anesthesia Post-op Follow-up Note (Signed)
Anesthesia QCDR form completed.        

## 2017-06-08 NOTE — Discharge Instructions (Signed)
AMBULATORY SURGERY  °DISCHARGE INSTRUCTIONS ° ° °1) The drugs that you were given will stay in your system until tomorrow so for the next 24 hours you should not: ° °A) Drive an automobile °B) Make any legal decisions °C) Drink any alcoholic beverage ° ° °2) You may resume regular meals tomorrow.  Today it is better to start with liquids and gradually work up to solid foods. ° °You may eat anything you prefer, but it is better to start with liquids, then soup and crackers, and gradually work up to solid foods. ° ° °3) Please notify your doctor immediately if you have any unusual bleeding, trouble breathing, redness and pain at the surgery site, drainage, fever, or pain not relieved by medication. ° ° ° °4) Additional Instructions: ° ° ° ° ° ° ° °Please contact your physician with any problems or Same Day Surgery at [phone], Monday through Friday 6 am to 4 pm, or [facility] [place] Main number at [phone]. °

## 2017-06-08 NOTE — Anesthesia Procedure Notes (Signed)
Procedure Name: Intubation Performed by: Rolla Plate, CRNA Pre-anesthesia Checklist: Patient identified, Patient being monitored, Timeout performed, Emergency Drugs available and Suction available Patient Re-evaluated:Patient Re-evaluated prior to induction Oxygen Delivery Method: Circle system utilized Preoxygenation: Pre-oxygenation with 100% oxygen Induction Type: IV induction and Rapid sequence Laryngoscope Size: Miller and 2 Grade View: Grade I Tube type: Oral Tube size: 7.0 mm Number of attempts: 1 Airway Equipment and Method: Stylet Placement Confirmation: ETT inserted through vocal cords under direct vision,  positive ETCO2 and breath sounds checked- equal and bilateral Secured at: 21 cm Tube secured with: Tape Dental Injury: Teeth and Oropharynx as per pre-operative assessment

## 2017-06-08 NOTE — Transfer of Care (Signed)
Immediate Anesthesia Transfer of Care Note  Patient: Stacy Pham  Procedure(s) Performed: LAPAROSCOPIC INSERTION CONTINUOUS AMBULATORY PERITONEAL DIALYSIS  (CAPD) CATHETER (N/A Abdomen)  Patient Location: PACU  Anesthesia Type:General  Level of Consciousness: awake  Airway & Oxygen Therapy: Patient Spontanous Breathing and Patient connected to face mask oxygen  Post-op Assessment: Report given to RN and Post -op Vital signs reviewed and stable  Post vital signs: Reviewed  Last Vitals:  Vitals:   06/08/17 1818 06/08/17 1819  BP: (!) (P) 133/47 (!) 133/47  Pulse: (P) 69 69  Resp: (P) 15 16  Temp: (P) 36.4 C   SpO2: (P) 98% 99%    Last Pain:  Vitals:   06/08/17 1243  TempSrc: Oral         Complications: No apparent anesthesia complications

## 2017-06-08 NOTE — H&P (Signed)
 [facility] History & Physical Update  The patient was interviewed and re-examined.  The patient's previous History and Physical has been reviewed and is unchanged.  There is no change in the plan of care. We plan to proceed with the scheduled procedure.  Hortencia Pilar, MD  06/08/2017, 3:22 PM

## 2017-06-08 NOTE — Telephone Encounter (Signed)
Kim searched the patients' chart and could not find more than the two orders that she had previously mentioned.

## 2017-06-08 NOTE — Anesthesia Preprocedure Evaluation (Addendum)
Anesthesia Evaluation  Patient identified by MRN, date of birth, ID band Patient awake    Reviewed: Allergy & Precautions, NPO status , Patient's Chart, lab work & pertinent test results  History of Anesthesia Complications Negative for: history of anesthetic complications  Airway Mallampati: III       Dental  (+) Upper Dentures, Lower Dentures   Pulmonary neg sleep apnea, neg COPD,           Cardiovascular hypertension, Pt. on medications and Pt. on home beta blockers (-) Past MI and (-) CHF (-) dysrhythmias (-) Valvular Problems/Murmurs     Neuro/Psych neg Seizures    GI/Hepatic Neg liver ROS, neg GERD  ,  Endo/Other  diabetes (no meds)  Renal/GU ESRF and DialysisRenal disease     Musculoskeletal   Abdominal   Peds  Hematology   Anesthesia Other Findings   Reproductive/Obstetrics                             Anesthesia Physical Anesthesia Plan  ASA: IV  Anesthesia Plan: General   Post-op Pain Management:    Induction:   PONV Risk Score and Plan: 3 and Dexamethasone, Ondansetron, Midazolam and Treatment may vary due to age or medical condition  Airway Management Planned: Oral ETT  Additional Equipment:   Intra-op Plan:   Post-operative Plan:   Informed Consent: I have reviewed the patients History and Physical, chart, labs and discussed the procedure including the risks, benefits and alternatives for the proposed anesthesia with the patient or authorized representative who has indicated his/her understanding and acceptance.     Plan Discussed with:   Anesthesia Plan Comments:         Anesthesia Quick Evaluation

## 2017-06-08 NOTE — Telephone Encounter (Signed)
I only see a set for TPA and a set for the Peritoneal Catheter.

## 2017-06-08 NOTE — Op Note (Signed)
  OPERATIVE NOTE   PROCEDURE: 1. Laparoscopic peritoneal dialysis catheter placement.  PRE-OPERATIVE DIAGNOSIS: 1. End stage renal disease   POST-OPERATIVE DIAGNOSIS: Same  SURGEON: Hortencia Pilar  ASSISTANT(S): None  ANESTHESIA: general  ESTIMATED BLOOD LOSS: Minimal   FINDING(S): 1. None  SPECIMEN(S): None  INDICATIONS:  Stacy Pham is a 83 y.o. female who presents with end stage renal disease. The patient has decided to do peritoneal dialysis for his long-term dialysis. Risks and benefits of placement were discussed and he is agreeable to proceed.  Differences between peritoneal dialysis and hemodialysis were discussed.    DESCRIPTION:  After obtaining full informed written consent, the patient was brought back to the operating room and placed supine upon the operating table. The patient received IV antibiotics prior to induction. After obtaining adequate anesthesia, the abdomen was prepped and draped in the standard fashion.   A small vertical incision was made in the midline just above the umbilicus and the dissection was carried down through the soft tissue to expose the fascia. Two 0 Vicryl sutures were then used to secure the fascia just lateral to the midline on both sides and an 15 blade was used to incise the fascia. The fascia is then elevated with a bone hook Varies needle is introduced and a saline test was performed indicating intraperitoneal location and insufflated of the abdomen with carbon dioxide is performed to 15 mmHg pressure. A 5 mm trocar is then placed again maintaining elevation of the fascia with a bone hook.  A oblique incision is then made in the left lower quadrant to the lateral portion of the rectus muscle the dissection is carried down through the soft tissues and the fascia is exposed. Small incision is made in the fascia with the Bovie and a pursestring suture of 0 Vicryl is placed. Upon initial attempt of placing the Seldinger needle the  adhesions are obstructing the catheter positioning and view of the placement and therefore they must be removed.  Seldinger needle was then inserted under direct visualization and the wire introduced under the view of the camera trocar was then placed and the catheter was advanced into the abdominal cavity over the trocar. Under direct visualization the catheters curled portion was positioned deep into the pelvis just to the right of midline. It was irrigated with heparinized saline. The deep cuff was secured to the fascial pursestring suture. A small counterincision was made in the right upper quadrant of the abdomen and the catheter was brought out this site. The appropriate distal connectors were placed, and I then placed 300 cc of saline through the catheter into the pelvis. The abdomen was desufflated. Immediately, 250 cc of effluent returned through the catheter when the bag was placed to gravity. I took one more look with the camera to ensure that the catheter was in the pelvis and it was. The hasson trocar was then removed. I then closed the incisions with a 2-0 Vicryl in the right lower quadrant incision and subsequently 3-0 Vicryl and 4-0 Monocryl, the other incisions were closed with 3-0 Vicryl and 4-0 Monocryl and placed Dermabond as dressing. Dry dressing was placed around the catheter exit site. The patient was then awakened from anesthesia and taken to the recovery room in stable condition having tolerated the procedure well.   COMPLICATIONS: None  CONDITION: None  Hortencia Pilar 06/08/2017, 6:21 PM

## 2017-06-09 ENCOUNTER — Encounter: Payer: Self-pay | Admitting: Vascular Surgery

## 2017-06-09 NOTE — Anesthesia Postprocedure Evaluation (Signed)
Anesthesia Post Note  Patient: Stacy Pham  Procedure(s) Performed: LAPAROSCOPIC INSERTION CONTINUOUS AMBULATORY PERITONEAL DIALYSIS  (CAPD) CATHETER (N/A Abdomen)  Patient location during evaluation: PACU Anesthesia Type: General Level of consciousness: awake and alert and oriented Pain management: pain level controlled Vital Signs Assessment: post-procedure vital signs reviewed and stable Respiratory status: spontaneous breathing Cardiovascular status: blood pressure returned to baseline Anesthetic complications: no     Last Vitals:  Vitals:   06/08/17 1901 06/08/17 1938  BP: (!) 130/54 (!) 139/55  Pulse: 66 (!) 59  Resp: 16 18  Temp: (!) 36.4 C   SpO2: 93% 98%    Last Pain:  Vitals:   06/08/17 1938  TempSrc:   PainSc: 3                  [name]

## 2017-06-30 ENCOUNTER — Ambulatory Visit (INDEPENDENT_AMBULATORY_CARE_PROVIDER_SITE_OTHER): Payer: Medicare Other | Admitting: Vascular Surgery

## 2017-06-30 ENCOUNTER — Encounter (INDEPENDENT_AMBULATORY_CARE_PROVIDER_SITE_OTHER): Payer: Self-pay | Admitting: Vascular Surgery

## 2017-06-30 VITALS — BP 160/67 | HR 73 | Resp 16 | Wt 182.0 lb

## 2017-06-30 DIAGNOSIS — I10 Essential (primary) hypertension: Secondary | ICD-10-CM | POA: Diagnosis not present

## 2017-06-30 DIAGNOSIS — I4891 Unspecified atrial fibrillation: Secondary | ICD-10-CM

## 2017-06-30 DIAGNOSIS — N186 End stage renal disease: Secondary | ICD-10-CM

## 2017-06-30 DIAGNOSIS — E1122 Type 2 diabetes mellitus with diabetic chronic kidney disease: Secondary | ICD-10-CM

## 2017-06-30 DIAGNOSIS — Z992 Dependence on renal dialysis: Secondary | ICD-10-CM | POA: Diagnosis not present

## 2017-07-02 ENCOUNTER — Encounter: Payer: Self-pay | Admitting: Family Medicine

## 2017-07-03 ENCOUNTER — Encounter (INDEPENDENT_AMBULATORY_CARE_PROVIDER_SITE_OTHER): Payer: Self-pay | Admitting: Vascular Surgery

## 2017-07-03 NOTE — Progress Notes (Signed)
MRN : [id number]  Stacy Pham is a 83 y.o. (12/18/1930) female who presents with chief complaint of  Chief Complaint  Patient presents with  . Follow-up    2wk follow up  .  History of Present Illness: The patient is seen for evaluation of dialysis access.  The patient has a PD catheter.  PD catheter has been flushed and is functioning well.  She has no complaints.    Current access is via a catheter which is functioning well.  There have not been any episodes of catheter infection.  The patient denies amaurosis fugax or recent TIA symptoms. There are no recent neurological changes noted. The patient denies claudication symptoms or rest pain symptoms. The patient denies history of DVT, PE or superficial thrombophlebitis. The patient denies recent episodes of angina or shortness of breath.    Current Meds  Medication Sig  . acetaminophen (TYLENOL 8 HOUR ARTHRITIS PAIN) 650 MG CR tablet Take 650 mg every 8 (eight) hours as needed by mouth for pain.  [name] allopurinol (ZYLOPRIM) 100 MG tablet Take 100 mg by mouth daily.  [name] apixaban (ELIQUIS) 2.5 MG TABS tablet Take 2.5 mg by mouth 2 (two) times daily.  [name] atorvastatin (LIPITOR) 20 MG tablet Take 20 mg by mouth every evening.   . calcium acetate (PHOSLO) 667 MG capsule Take 1,334 mg 3 (three) times daily with meals by mouth.  . cinacalcet (SENSIPAR) 30 MG tablet Take 1 tablet (30 mg total) by mouth daily with supper.  . colchicine 0.6 MG tablet Take 0.6 mg daily as needed by mouth (for gout flare ups).   . diltiazem (DILACOR XR) 240 MG 24 hr capsule Take 240 mg by mouth daily.  . fluticasone (FLONASE) 50 MCG/ACT nasal spray Place 1 spray into both nostrils daily as needed for allergies or rhinitis.  [name] gabapentin (NEURONTIN) 100 MG capsule Take 100 mg by mouth 3 (three) times daily.  . hydrALAZINE (APRESOLINE) 100 MG tablet Take 100 mg by mouth 3 (three) times daily.  . hydroxypropyl methylcellulose / hypromellose (ISOPTO TEARS /  GONIOVISC) 2.5 % ophthalmic solution Place 1-2 drops 3 (three) times daily as needed into both eyes for dry eyes.  [name] labetalol (NORMODYNE) 300 MG tablet Take 300 mg by mouth 3 (three) times daily.  [name] losartan (COZAAR) 100 MG tablet Take 100 mg by mouth daily.  [name] oxyCODONE-acetaminophen (ROXICET) 5-325 MG tablet Take 1-2 tablets by mouth every 6 (six) hours as needed for moderate pain or severe pain.   Current Facility-Administered Medications for the 06/30/17 encounter (Office Visit) with Delana Meyer, Dolores Lory, MD  Medication  . alteplase (CATHFLO ACTIVASE) injection 2 mg  . alteplase (CATHFLO ACTIVASE) injection 2 mg    Past Medical History:  Diagnosis Date  . Atrial fibrillation ([place])   . Diabetes mellitus without complication ([place])   . Dialysis patient ([place])    Mon-Wed-Friday  . Hypertension   . Renal failure     Past Surgical History:  Procedure Laterality Date  . CAPD INSERTION N/A 06/08/2017   Procedure: LAPAROSCOPIC INSERTION CONTINUOUS AMBULATORY PERITONEAL DIALYSIS  (CAPD) CATHETER;  Surgeon: Katha Cabal, MD;  Location: [facility] ORS;  Service: Vascular;  Laterality: N/A;  . CATARACT EXTRACTION, BILATERAL Bilateral   . DIALYSIS/PERMA CATHETER INSERTION N/A 03/18/2017   Procedure: DIALYSIS/PERMA CATHETER INSERTION;  Surgeon: Katha Cabal, MD;  Location: [facility] [facility];  Service: Cardiovascular;  Laterality: N/A;    Social History Social History   Tobacco Use  .  Smoking status: Never Smoker  . Smokeless tobacco: Never Used  Substance Use Topics  . Alcohol use: No  . Drug use: No    Family History Family History  Problem Relation Age of Onset  . Hypertension Father     No Known Allergies   REVIEW OF SYSTEMS (Negative unless checked)  Constitutional: [] Weight loss  [] Fever  [] Chills Cardiac: [] Chest pain   [] Chest pressure   [] Palpitations   [] Shortness of breath when laying flat   [] Shortness of breath with exertion. Vascular:  [] Pain in legs with  walking   [] Pain in legs at rest  [] History of DVT   [] Phlebitis   [] Swelling in legs   [] Varicose veins   [] Non-healing ulcers Pulmonary:   [] Uses home oxygen   [] Productive cough   [] Hemoptysis   [] Wheeze  [] COPD   [] Asthma Neurologic:  [] Dizziness   [] Seizures   [] History of stroke   [] History of TIA  [] Aphasia   [] Vissual changes   [] Weakness or numbness in arm   [] Weakness or numbness in leg Musculoskeletal:   [] Joint swelling   [] Joint pain   [] Low back pain Hematologic:  [] Easy bruising  [] Easy bleeding   [] Hypercoagulable state   [] Anemic Gastrointestinal:  [] Diarrhea   [] Vomiting  [] Gastroesophageal reflux/heartburn   [] Difficulty swallowing. Genitourinary:  [x] Chronic kidney disease   [] Difficult urination  [] Frequent urination   [] Blood in urine Skin:  [] Rashes   [] Ulcers  Psychological:  [] History of anxiety   []  History of major depression.  Physical Examination  Vitals:   06/30/17 1310  BP: (!) 160/67  Pulse: 73  Resp: 16  Weight: 82.6 kg (182 lb)   Body mass index is 33.29 kg/m. Gen: WD/WN, NAD Head: [place]/AT, No temporalis wasting.  Ear/Nose/Throat: Hearing grossly intact, nares w/o erythema or drainage Eyes: PER, EOMI, sclera nonicteric.  Neck: Supple, no large masses.   Pulmonary:  Good air movement, no audible wheezing bilaterally, no use of accessory muscles.  Cardiac: RRR, no JVD Vascular: PD catheter clean dry and intact both incisions well-healed. Vessel Right Left  Radial Palpable Palpable  Gastrointestinal: Non-distended. No guarding/no peritoneal signs.  Musculoskeletal: M/S 5/5 throughout.  No deformity or atrophy.  Neurologic: CN 2-12 intact. Symmetrical.  Speech is fluent. Motor exam as listed above. Psychiatric: Judgment intact, Mood & affect appropriate for pt's clinical situation. Dermatologic: No rashes or ulcers noted.  No changes consistent with cellulitis. Lymph : No lichenification or skin changes of chronic lymphedema.  CBC Lab Results    Component Value Date   WBC 7.3 06/08/2017   HGB 13.6 06/08/2017   HCT 40.0 06/08/2017   MCV 100.5 (H) 06/08/2017   PLT 290 06/08/2017    BMET    Component Value Date/Time   NA 140 06/08/2017 1311   K 4.3 06/08/2017 1311   CL 99 (L) 06/08/2017 1250   CO2 23 06/08/2017 1250   GLUCOSE 100 (H) 06/08/2017 1311   BUN 73 (H) 06/08/2017 1250   CREATININE 12.05 (H) 06/08/2017 1250   CALCIUM 9.2 06/08/2017 1250   GFRNONAA 2 (L) 06/08/2017 1250   GFRAA 3 (L) 06/08/2017 1250   CrCl cannot be calculated (Patient's most recent lab result is older than the maximum 21 days allowed.).  COAG Lab Results  Component Value Date   INR 1.08 06/08/2017   INR 1.09 04/22/2017   INR 1.22 03/18/2017    Radiology No results found.  Assessment/Plan 1. Stage 5 chronic kidney disease on chronic dialysis [facility]) Recommend:  The patient  is doing well and currently has adequate dialysis access. The patient's dialysis center is not reporting any access issues.  PD catheter clean dry and intact and has been flushed with good result.  Is okay to begin peritoneal dialysis any time hereafter.  The patient will follow-up with me in the office PRN   2. Type 2 diabetes mellitus with chronic kidney disease on chronic dialysis, without long-term current use of insulin (HCC) Continue hypoglycemic medications as already ordered, these medications have been reviewed and there are no changes at this time.  Hgb A1C to be monitored as already arranged by primary service   3. Essential hypertension Continue antihypertensive medications as already ordered, these medications have been reviewed and there are no changes at this time.   4. Atrial fibrillation, unspecified type ([place]) Continue antiarrhythmia medications as already ordered, these medications have been reviewed and there are no changes at this time.  Continue anticoagulation as ordered by Cardiology Service     Hortencia Pilar,  MD  07/03/2017 10:31 AM

## 2017-07-04 ENCOUNTER — Other Ambulatory Visit: Payer: Self-pay | Admitting: Family Medicine

## 2017-07-04 MED ORDER — COLCHICINE 0.6 MG PO TABS: 0.6000 mg | ORAL_TABLET | Freq: Every day | ORAL | 1 refills | Status: DC | PRN

## 2017-07-04 MED ORDER — APIXABAN 2.5 MG PO TABS: 2.5000 mg | ORAL_TABLET | Freq: Two times a day (BID) | ORAL | 11 refills | Status: DC

## 2017-07-04 NOTE — Telephone Encounter (Signed)
Pt's daughter calling. And desperately needing the refills done. Needing colchicine and apixaban refilled. Please call daughters cell to let her know if it can be called in today. [phone]

## 2017-07-04 NOTE — Telephone Encounter (Signed)
Not a medication that you have filled before, okay to refill?

## 2017-07-04 NOTE — Telephone Encounter (Signed)
Please call and let her know that I have sent in colchicine and apixaban. The reason they can't be refilled on Mychart is because I am not on record as prescribing them. Once I prescribe them, then she can request refill that way, other wise she can send me a mychart message or call. Please allow 3-5 business days to com[plete refills.

## 2017-07-18 ENCOUNTER — Other Ambulatory Visit (INDEPENDENT_AMBULATORY_CARE_PROVIDER_SITE_OTHER): Payer: Self-pay | Admitting: Vascular Surgery

## 2017-07-18 ENCOUNTER — Inpatient Hospital Stay: Admission: RE | Admit: 2017-07-18 | Payer: Medicare Other | Source: Ambulatory Visit

## 2017-07-18 ENCOUNTER — Telehealth (INDEPENDENT_AMBULATORY_CARE_PROVIDER_SITE_OTHER): Payer: Self-pay | Admitting: Vascular Surgery

## 2017-07-18 ENCOUNTER — Ambulatory Visit
Admission: RE | Admit: 2017-07-18 | Discharge: 2017-07-18 | Disposition: A | Discharge status: Home / Self Care | Payer: Medicare Other | Source: Ambulatory Visit | Attending: Vascular Surgery | Admitting: Vascular Surgery

## 2017-07-18 DIAGNOSIS — N186 End stage renal disease: Secondary | ICD-10-CM | POA: Diagnosis not present

## 2017-07-18 DIAGNOSIS — Z01818 Encounter for other preprocedural examination: Secondary | ICD-10-CM | POA: Diagnosis present

## 2017-07-18 SURGERY — Surgical Case
Anesthesia: *Unknown

## 2017-07-18 MED ORDER — ALTEPLASE 100 MG IV SOLR
5.0000 mg | Freq: Once | INTRAVENOUS | Status: AC
  Administered 2017-07-18: 5 mg via INTRAVENOUS
  Filled 2017-07-18: qty 5

## 2017-07-18 MED ORDER — SODIUM CHLORIDE 0.9 % IV SOLN
5.0000 mg | Freq: Once | INTRAVENOUS | Status: AC
  Administered 2017-07-18: 5 mg via INTRAVENOUS
  Filled 2017-07-18: qty 5

## 2017-07-18 NOTE — Telephone Encounter (Signed)
Researched this patient and I can see that she had a PD cath placed on 11/28 and that she came in on 12/20 for a follow up, not sure what the patient's Granddaughter is questioning?  Please advise.

## 2017-07-18 NOTE — Telephone Encounter (Signed)
LVM on patients daugher voicemail to call the office to discuss concerns or updates in regards to patients catheter.

## 2017-07-18 NOTE — Telephone Encounter (Signed)
Please call the patient's Grandaughter to let her know that the patient should be seen in the office by Dr. Delana Meyer with the Granddaughter to discuss their concerns since he is following her care. This will be at the patient's preference as to when, and this is not a ASAP appointment according to Dr. Lucky Cowboy.

## 2017-07-18 NOTE — Telephone Encounter (Signed)
New Message  Daughter verbalized she needs to speak to MD to provide update about pts catheter. When asked for more info pts granddaughter verbalized she would prefer to discuss it with MD.  Please f/u

## 2017-07-21 ENCOUNTER — Encounter: Payer: Self-pay | Admitting: Family Medicine

## 2017-07-22 ENCOUNTER — Telehealth: Payer: Self-pay

## 2017-07-22 ENCOUNTER — Other Ambulatory Visit: Payer: Self-pay | Admitting: Family Medicine

## 2017-07-22 DIAGNOSIS — I4891 Unspecified atrial fibrillation: Secondary | ICD-10-CM

## 2017-07-22 DIAGNOSIS — I10 Essential (primary) hypertension: Secondary | ICD-10-CM

## 2017-07-22 MED ORDER — HYDRALAZINE HCL 100 MG PO TABS: 100.0000 mg | ORAL_TABLET | Freq: Three times a day (TID) | ORAL | 1 refills | Status: DC

## 2017-07-22 NOTE — Telephone Encounter (Signed)
Copied from [place] [phone]. Topic: General - Other >> Jul 22, 2017 10:07 AM Lolita Rieger, RMA wrote: Reason for CRM: pt daughter called and wanted to know why she can not refill her mothers mediciation on mychart and would like a call back to discuss what her mother needs refilled before the weather gets bad Please call her at [phone] [name]

## 2017-07-22 NOTE — Telephone Encounter (Signed)
Med has last filled by alternate provider

## 2017-07-22 NOTE — Telephone Encounter (Signed)
Copied from [place] [phone]. Topic: General - Other >> Jul 22, 2017 10:07 AM Lolita Rieger, RMA wrote: Reason for CRM: pt daughter called and wanted to know why she can not refill her mothers mediciation on mychart and would like a call back to discuss what her mother needs refilled before the weather gets bad Please call her at [phone] [name]

## 2017-07-22 NOTE — Telephone Encounter (Signed)
Pt advised via mychart per [employer]

## 2017-08-09 ENCOUNTER — Other Ambulatory Visit: Payer: Self-pay | Admitting: Nephrology

## 2017-08-09 DIAGNOSIS — Z466 Encounter for fitting and adjustment of urinary device: Secondary | ICD-10-CM

## 2017-08-16 ENCOUNTER — Ambulatory Visit
Admission: RE | Admit: 2017-08-16 | Discharge: 2017-08-16 | Disposition: A | Discharge status: Home / Self Care | Payer: Medicare Other | Source: Ambulatory Visit | Attending: Nephrology | Admitting: Nephrology

## 2017-08-16 DIAGNOSIS — Z4682 Encounter for fitting and adjustment of non-vascular catheter: Secondary | ICD-10-CM | POA: Diagnosis not present

## 2017-08-16 DIAGNOSIS — Z466 Encounter for fitting and adjustment of urinary device: Secondary | ICD-10-CM

## 2017-08-17 ENCOUNTER — Encounter: Payer: Self-pay | Admitting: Family Medicine

## 2017-08-18 NOTE — Telephone Encounter (Signed)
Med on Hx list as last filled by Cardiology. Last OV 05/2017-establish. pls advise

## 2017-08-19 ENCOUNTER — Other Ambulatory Visit: Payer: Self-pay | Admitting: Family Medicine

## 2017-08-19 MED ORDER — LOSARTAN POTASSIUM 100 MG PO TABS: 100.0000 mg | ORAL_TABLET | Freq: Every day | ORAL | 1 refills | Status: DC

## 2017-08-23 NOTE — Progress Notes (Signed)
New Outpatient Visit Date: 08/24/2017  Referring Provider: Elby Beck, [place], [place] [address]  Chief Complaint: Establish cardiology care  HPI:  Stacy Pham is a 84 y.o. female who is being seen today for the evaluation of atrial fibrillation and heart failure at the request of Stacy Pham. She has a history of atrial fibrillation, diabetes mellitus, hypertension, and Stacy Pham renal disease on hemodialysis. She moved to [place] from [place] last fall; no records regarding prior cardiac care or testing are available today.  Stacy Pham notes that she was hospitalized last April and July in [place] [facility] and [facility]) due to anemia and renal failure. She was started on hemodialysis during this period. She was also diagnosed with a-fib and ultimately started on apixaban 2.5 mg BID.  Since moving to [facility], Stacy Pham has felt relatively well. She has experienced occasional low diastolic blood pressures with mild lightheadedness. She denies chest pain, shortness of breath, palpitations, and edema. She is in the process of transitioning from HD to PD, though she notes some issues with her PD catheter at this time.  Other than a-fib, Stacy Pham denies a history of prior cardiac disease. He chart suggests a history of heart failure, though further details are unavailable. Stacy Pham believes that an echo was performed during her hospitalization in [place] last year, but she does not think that additional testing has been done.  --------------------------------------------------------------------------------------------------  Cardiovascular History & Procedures: Cardiovascular Problems:  Paroxysmal atrial fibrillation  Possible CHF  Risk Factors:  Hypertension, diabetes mellitus, and age > 41  Cath/PCI:  None  CV Surgery:  None  EP Procedures and Devices:  None  Non-Invasive Evaluation(s):  None available  Recent CV Pertinent Labs: Lab  Results  Component Value Date   INR 1.08 06/08/2017   K 4.3 06/08/2017   BUN 73 (H) 06/08/2017   CREATININE 12.05 (H) 06/08/2017    --------------------------------------------------------------------------------------------------  Past Medical History:  Diagnosis Date  . Atrial fibrillation ([place])   . Diabetes mellitus without complication ([place])   . Dialysis patient ([place])    Mon-Wed-Friday  . Hypertension   . Renal failure     Past Surgical History:  Procedure Laterality Date  . CAPD INSERTION N/A 06/08/2017   Procedure: LAPAROSCOPIC INSERTION CONTINUOUS AMBULATORY PERITONEAL DIALYSIS  (CAPD) CATHETER;  Surgeon: Katha Cabal, MD;  Location: [facility] ORS;  Service: Vascular;  Laterality: N/A;  . CATARACT EXTRACTION, BILATERAL Bilateral   . DIALYSIS/PERMA CATHETER INSERTION N/A 03/18/2017   Procedure: DIALYSIS/PERMA CATHETER INSERTION;  Surgeon: Katha Cabal, MD;  Location: [facility];  Service: Cardiovascular;  Laterality: N/A;    Current Meds  Medication Sig  . acetaminophen (TYLENOL 8 HOUR ARTHRITIS PAIN) 650 MG CR tablet Take 650 mg every 8 (eight) hours as needed by mouth for pain.  [name] allopurinol (ZYLOPRIM) 100 MG tablet Take 100 mg by mouth daily.  [name] apixaban (ELIQUIS) 2.5 MG TABS tablet Take 1 tablet (2.5 mg total) by mouth 2 (two) times daily.  [name] atorvastatin (LIPITOR) 20 MG tablet Take 20 mg by mouth every evening.   . calcium acetate (PHOSLO) 667 MG capsule Take 1,334 mg 3 (three) times daily with meals by mouth.  . cinacalcet (SENSIPAR) 30 MG tablet Take 1 tablet (30 mg total) by mouth daily with supper.  . colchicine 0.6 MG tablet Take 1 tablet (0.6 mg total) by mouth daily as needed (for gout flare ups).  . diltiazem (DILACOR XR) 240  MG 24 hr capsule Take 240 mg by mouth daily.  . fluticasone (FLONASE) 50 MCG/ACT nasal spray Place 1 spray into both nostrils daily as needed for allergies or rhinitis.  [name] gabapentin (NEURONTIN) 100 MG capsule Take 100  mg by mouth 3 (three) times daily.  . hydrALAZINE (APRESOLINE) 100 MG tablet Take 1 tablet (100 mg total) by mouth 3 (three) times daily.  . hydroxypropyl methylcellulose / hypromellose (ISOPTO TEARS / GONIOVISC) 2.5 % ophthalmic solution Place 1-2 drops 3 (three) times daily as needed into both eyes for dry eyes.  [name] labetalol (NORMODYNE) 300 MG tablet Take 300 mg by mouth 3 (three) times daily.  [name] lanthanum (FOSRENOL) 1000 MG chewable tablet Chew 1 tablet (1,000 mg total) by mouth 3 (three) times daily with meals.  [name] losartan (COZAAR) 100 MG tablet Take 1 tablet (100 mg total) by mouth daily.  [name] oxyCODONE-acetaminophen (ROXICET) 5-325 MG tablet Take 1-2 tablets by mouth every 6 (six) hours as needed for moderate pain or severe pain.    Allergies: Patient has no known allergies.  Social History   Socioeconomic History  . Marital status: Single    Spouse name: Not on file  . Number of children: Not on file  . Years of education: Not on file  . Highest education level: Not on file  Social Pham  . Financial resource strain: Not on file  . Food insecurity - worry: Not on file  . Food insecurity - inability: Not on file  . Transportation Pham - medical: Not on file  . Transportation Pham - non-medical: Not on file  Occupational History  . Not on file  Tobacco Use  . Smoking status: Never Smoker  . Smokeless tobacco: Never Used  Substance and Sexual Activity  . Alcohol use: No  . Drug use: No  . Sexual activity: Not on file  Other Topics Concern  . Not on file  Social History Narrative  . Not on file    Family History  Problem Relation Age of Onset  . Hypertension Father     Review of Systems: A 12-system review of systems was performed and was negative except as noted in the HPI.  --------------------------------------------------------------------------------------------------  Physical Exam: BP 130/60 (BP Location: Left Arm, Patient Position: Sitting, Cuff Size:  Normal)   Pulse 73   Ht 5\' 2"  (1.575 m)   Wt 189 lb (85.7 kg)   BMI 34.57 kg/m   General:  Elderly woman, seated comfortably in the exam room. She is accompanied by her daughter, with whom she now lives. HEENT: No conjunctival pallor or scleral icterus. Moist mucous membranes. OP clear. Neck: Supple without lymphadenopathy, thyromegaly, JVD, or HJR. No carotid bruit. Lungs: Normal work of breathing. Clear to auscultation bilaterally without wheezes or crackles. Heart: Regular rate and rhythm without murmurs, rubs, or gallops. Non-displaced PMI. Abd: Bowel sounds present. Soft, NT/ND. Unable to assess HSM due to body habitus. PD catheter in place. Ext: No lower extremity edema. Radial, PT, and DP pulses are 2+ bilaterally Skin: Warm and dry without rash. Left chest HD catheter in place. Neuro: CNIII-XII intact. Strength and fine-touch sensation intact in upper and lower extremities bilaterally. Psych: Normal mood and affect.  EKG:  NSR with borderline LVH. Otherwise, no significant abnormalities.  Lab Results  Component Value Date   WBC 7.3 06/08/2017   HGB 13.6 06/08/2017   HCT 40.0 06/08/2017   MCV 100.5 (H) 06/08/2017   PLT 290 06/08/2017    Lab Results  Component  Value Date   NA 140 06/08/2017   K 4.3 06/08/2017   CL 99 (L) 06/08/2017   CO2 23 06/08/2017   BUN 73 (H) 06/08/2017   CREATININE 12.05 (H) 06/08/2017   GLUCOSE 100 (H) 06/08/2017   ALT 16 03/18/2017    No results found for: CHOL, HDL, LDLCALC, LDLDIRECT, TRIG, CHOLHDL   --------------------------------------------------------------------------------------------------  ASSESSMENT AND PLAN: Paroxysmal atrial fibrillation Patient and her daughter repor atrial fibrillation during at least one hospitalization while in [place] last year. No further details are available. EKG today shows sinus rhythm. Though literature regarding apixaban in dialysis patients is limited, the available data suggest that it is a  reasonable option. We will therefore continue with apixaban 2.5 mg BID. We will request records from [facility] and [facility] [employer] [facility].  Heart failure No details about LVEF or potential causes. Stacy Pham appears euvolemic and well-compensated today. No medication changes at this time.  Hypertension Blood pressure is upper normal today (goal < 130/80). No medication changes today. I will defer continuing management to Stacy Pham and nephrology.  ESRD Continue transition from HD to PD per nephrology and vascular surgery.  Follow-up: Return to clinic in 6 weeks.  Nelva Bush, MD 08/24/2017 12:08 PM

## 2017-08-24 ENCOUNTER — Encounter (INDEPENDENT_AMBULATORY_CARE_PROVIDER_SITE_OTHER): Payer: Self-pay

## 2017-08-24 ENCOUNTER — Ambulatory Visit: Payer: Medicare Other | Admitting: Internal Medicine

## 2017-08-24 ENCOUNTER — Encounter: Payer: Self-pay | Admitting: Internal Medicine

## 2017-08-24 VITALS — BP 130/60 | HR 73 | Ht 62.0 in | Wt 189.0 lb

## 2017-08-24 DIAGNOSIS — I10 Essential (primary) hypertension: Secondary | ICD-10-CM

## 2017-08-24 DIAGNOSIS — I38 Endocarditis, valve unspecified: Secondary | ICD-10-CM | POA: Diagnosis not present

## 2017-08-24 DIAGNOSIS — I480 Paroxysmal atrial fibrillation: Secondary | ICD-10-CM

## 2017-08-24 DIAGNOSIS — N186 End stage renal disease: Secondary | ICD-10-CM | POA: Diagnosis not present

## 2017-08-24 DIAGNOSIS — I509 Heart failure, unspecified: Secondary | ICD-10-CM | POA: Diagnosis not present

## 2017-08-24 NOTE — Patient Instructions (Signed)
Medication Instructions:  Your physician recommends that you continue on your current medications as directed. Please refer to the Current Medication list given to you today.   Labwork: none  Testing/Procedures: none  Follow-Up: Your physician recommends that you schedule a follow-up appointment in: [place] APP.   We will have you sign Release of Information to retrieve Medical Records from [place].

## 2017-08-25 ENCOUNTER — Encounter: Payer: Self-pay | Admitting: Internal Medicine

## 2017-08-25 DIAGNOSIS — I38 Endocarditis, valve unspecified: Secondary | ICD-10-CM | POA: Insufficient documentation

## 2017-08-25 DIAGNOSIS — I509 Heart failure, unspecified: Secondary | ICD-10-CM

## 2017-08-25 DIAGNOSIS — N186 End stage renal disease: Secondary | ICD-10-CM | POA: Insufficient documentation

## 2017-08-29 ENCOUNTER — Other Ambulatory Visit (INDEPENDENT_AMBULATORY_CARE_PROVIDER_SITE_OTHER): Payer: Self-pay | Admitting: Vascular Surgery

## 2017-08-30 ENCOUNTER — Ambulatory Visit: Admission: RE | Admit: 2017-08-30 | Payer: Medicare Other | Source: Ambulatory Visit | Admitting: Vascular Surgery

## 2017-08-30 SURGERY — DIALYSIS/PERMA CATHETER REMOVAL
Anesthesia: LOCAL

## 2017-08-31 ENCOUNTER — Telehealth: Payer: Self-pay | Admitting: *Deleted

## 2017-08-31 NOTE — Telephone Encounter (Signed)
S/w patient's daughter, ok per DPR. She verbalized understanding of Dr Darnelle Bos recommendations.  Contacted scheduler and medical records have not been received yet. She will re-fax the request forms.

## 2017-08-31 NOTE — Telephone Encounter (Signed)
-----   Message from Nelva Bush, MD sent at 08/30/2017 10:11 PM EST ----- Regarding: Anticoagulation Hi Anderson [place],  Please let Ms. Freas know that I have reviewed the literature and spoken with our pharmacy team regarding apixaban use in dialysis patients.  Though there is not a lot of data, it seems that her current dose of apixaban is reasonable, even in the setting of peritoneal dialysis.  She should continue her current medications.  We will follow-up as planned in the office after having reviewed her outside records from [place] (when they arrive).  Thanks.  Gerald Stabs

## 2017-09-02 ENCOUNTER — Other Ambulatory Visit: Payer: Self-pay | Admitting: Family Medicine

## 2017-09-02 DIAGNOSIS — E1122 Type 2 diabetes mellitus with diabetic chronic kidney disease: Secondary | ICD-10-CM

## 2017-09-02 DIAGNOSIS — Z992 Dependence on renal dialysis: Principal | ICD-10-CM

## 2017-09-02 DIAGNOSIS — N186 End stage renal disease: Principal | ICD-10-CM

## 2017-09-06 ENCOUNTER — Encounter (INDEPENDENT_AMBULATORY_CARE_PROVIDER_SITE_OTHER): Payer: Self-pay

## 2017-09-07 ENCOUNTER — Ambulatory Visit (INDEPENDENT_AMBULATORY_CARE_PROVIDER_SITE_OTHER): Payer: Medicare Other

## 2017-09-07 ENCOUNTER — Ambulatory Visit (INDEPENDENT_AMBULATORY_CARE_PROVIDER_SITE_OTHER): Payer: Medicare Other | Admitting: Family Medicine

## 2017-09-07 VITALS — BP 122/60 | HR 72 | Temp 98.1°F | Ht 62.5 in | Wt 193.8 lb

## 2017-09-07 DIAGNOSIS — Z992 Dependence on renal dialysis: Secondary | ICD-10-CM

## 2017-09-07 DIAGNOSIS — E1122 Type 2 diabetes mellitus with diabetic chronic kidney disease: Secondary | ICD-10-CM | POA: Diagnosis not present

## 2017-09-07 DIAGNOSIS — N186 End stage renal disease: Secondary | ICD-10-CM

## 2017-09-07 DIAGNOSIS — I10 Essential (primary) hypertension: Secondary | ICD-10-CM

## 2017-09-07 DIAGNOSIS — I4891 Unspecified atrial fibrillation: Secondary | ICD-10-CM | POA: Diagnosis not present

## 2017-09-07 DIAGNOSIS — Z0000 Encounter for general adult medical examination without abnormal findings: Secondary | ICD-10-CM

## 2017-09-07 LAB — HEMOGLOBIN A1C: HEMOGLOBIN A1C: 5.6 % (ref 4.6–6.5)

## 2017-09-07 MED ORDER — DILTIAZEM HCL ER 240 MG PO CP24: 240.0000 mg | ORAL_CAPSULE | Freq: Every day | ORAL | 1 refills | Status: DC

## 2017-09-07 MED ORDER — ATORVASTATIN CALCIUM 20 MG PO TABS: 20.0000 mg | ORAL_TABLET | Freq: Every evening | ORAL | 1 refills | Status: DC

## 2017-09-07 MED ORDER — FLUTICASONE PROPIONATE 50 MCG/ACT NA SUSP: 1.0000 | Freq: Every day | NASAL | 5 refills | Status: DC | PRN

## 2017-09-07 NOTE — Progress Notes (Signed)
Pre visit review using our clinic review tool, if applicable. No additional management support is needed unless otherwise documented below in the visit note. 

## 2017-09-07 NOTE — Patient Instructions (Signed)
So great to see you today!  Please follow up in 6 months, sooner if you need anything

## 2017-09-07 NOTE — Patient Instructions (Addendum)
Stacy Pham , Thank you for taking time to come for your Medicare Wellness Visit. I appreciate your ongoing commitment to your health goals. Please review the following plan we discussed and let me know if I can assist you in the future.   These are the goals we discussed: Goals    . Follow up with Primary Care Provider     Starting 09/07/2017, I will continue to take medications as prescribed and to keep appointments with PCP as scheduled.        This is a list of the screening recommended for you and due dates:  Health Maintenance  Topic Date Due  . Complete foot exam   09/07/2017*  . Flu Shot  10/11/2017*  . Tetanus Vaccine  09/07/2018*  . Eye exam for diabetics  09/08/2018*  . Pneumonia vaccines (1 of 2 - PCV13) 09/08/2018*  . Hemoglobin A1C  11/15/2017  . DEXA scan (bone density measurement)  Completed  *Topic was postponed. The date shown is not the original due date.   Preventive Care for Adults  A healthy lifestyle and preventive care can promote health and wellness. Preventive health guidelines for adults include the following key practices.  . A routine yearly physical is a good way to check with your health care provider about your health and preventive screening. It is a chance to share any concerns and updates on your health and to receive a thorough exam.  . Visit your dentist for a routine exam and preventive care every 6 months. Brush your teeth twice a day and floss once a day. Good oral hygiene prevents tooth decay and gum disease.  . The frequency of eye exams is based on your age, health, family medical history, use  of contact lenses, and other factors. Follow your health care provider's recommendations for frequency of eye exams.  . Eat a healthy diet. Foods like vegetables, fruits, whole grains, low-fat dairy products, and lean protein foods contain the nutrients you need without too many calories. Decrease your intake of foods high in solid fats, added sugars,  and salt. Eat the right amount of calories for you. Get information about a proper diet from your health care provider, if necessary.  . Regular physical exercise is one of the most important things you can do for your health. Most adults should get at least 150 minutes of moderate-intensity exercise (any activity that increases your heart rate and causes you to sweat) each week. In addition, most adults need muscle-strengthening exercises on 2 or more days a week.  Silver Sneakers may be a benefit available to you. To determine eligibility, you may visit the website: www.silversneakers.com or contact program at [phone] Mon-Fri between 8AM-8PM.   . Maintain a healthy weight. The body mass index (BMI) is a screening tool to identify possible weight problems. It provides an estimate of body fat based on height and weight. Your health care provider can find your BMI and can help you achieve or maintain a healthy weight.   For adults 20 years and older: ? A BMI below 18.5 is considered underweight. ? A BMI of 18.5 to 24.9 is normal. ? A BMI of 25 to 29.9 is considered overweight. ? A BMI of 30 and above is considered obese.   . Maintain normal blood lipids and cholesterol levels by exercising and minimizing your intake of saturated fat. Eat a balanced diet with plenty of fruit and vegetables. Blood tests for lipids and cholesterol should begin at age 86  and be repeated every 5 years. If your lipid or cholesterol levels are high, you are over 50, or you are at high risk for heart disease, you may need your cholesterol levels checked more frequently. Ongoing high lipid and cholesterol levels should be treated with medicines if diet and exercise are not working.  . If you smoke, find out from your health care provider how to quit. If you do not use tobacco, please do not start.  . If you choose to drink alcohol, please do not consume more than 2 drinks per day. One drink is considered to be 12  ounces (355 mL) of beer, 5 ounces (148 mL) of wine, or 1.5 ounces (44 mL) of liquor.  . If you are 55-74 years old, ask your health care provider if you should take aspirin to prevent strokes.  . Use sunscreen. Apply sunscreen liberally and repeatedly throughout the day. You should seek shade when your shadow is shorter than you. Protect yourself by wearing long sleeves, pants, a wide-brimmed hat, and sunglasses year round, whenever you are outdoors.  . Once a month, do a whole body skin exam, using a mirror to look at the skin on your back. Tell your health care provider of new moles, moles that have irregular borders, moles that are larger than a pencil eraser, or moles that have changed in shape or color.

## 2017-09-07 NOTE — Progress Notes (Signed)
Subjective:   Stacy Pham is a 84 y.o. female who presents for an Initial Medicare Annual Wellness Visit.  Review of Systems    N/A  Cardiac Risk Factors include: advanced age (>41men, >61 women);obesity (BMI >30kg/m2)     Objective:    Today's Vitals   09/07/17 0851  BP: 122/60  Pulse: 72  Temp: 98.1 F (36.7 C)  TempSrc: Oral  SpO2: 93%  Weight: 193 lb 12 oz (87.9 kg)  Height: 5' 2.5" (1.588 m)  PainSc: 0-No pain   Body mass index is 34.87 kg/m.  Advanced Directives 09/07/2017 05/09/2017 05/06/2017 04/27/2017 04/27/2017 03/18/2017 03/18/2017  Does Patient Have a Medical Advance Directive? No No No No No No No  Would patient like information on creating a medical advance directive? Yes (MAU/Ambulatory/Procedural Areas - Information given) No - Patient declined No - Patient declined No - Patient declined No - Patient declined - No - Patient declined    Current Medications (verified) Outpatient Encounter Medications as of 09/07/2017  Medication Sig  . acetaminophen (TYLENOL 8 HOUR ARTHRITIS PAIN) 650 MG CR tablet Take 650 mg every 8 (eight) hours as needed by mouth for pain.  [name] allopurinol (ZYLOPRIM) 100 MG tablet Take 100 mg by mouth daily.  [name] apixaban (ELIQUIS) 2.5 MG TABS tablet Take 1 tablet (2.5 mg total) by mouth 2 (two) times daily.  [name] atorvastatin (LIPITOR) 20 MG tablet Take 20 mg by mouth every evening.   . calcium acetate (PHOSLO) 667 MG capsule Take 667 mg by mouth 3 (three) times daily with meals.   . cinacalcet (SENSIPAR) 30 MG tablet Take 1 tablet (30 mg total) by mouth daily with supper. (Patient taking differently: Take 30 mg by mouth daily. With largest meal)  . colchicine 0.6 MG tablet Take 1 tablet (0.6 mg total) by mouth daily as needed (for gout flare ups).  . diltiazem (DILACOR XR) 240 MG 24 hr capsule Take 240 mg by mouth daily.  . fluticasone (FLONASE) 50 MCG/ACT nasal spray Place 1 spray into both nostrils daily as needed for allergies or rhinitis.    [name] gabapentin (NEURONTIN) 100 MG capsule Take 100 mg by mouth 3 (three) times daily.  . hydrALAZINE (APRESOLINE) 100 MG tablet Take 1 tablet (100 mg total) by mouth 3 (three) times daily.  . hydroxypropyl methylcellulose / hypromellose (ISOPTO TEARS / GONIOVISC) 2.5 % ophthalmic solution Place 1-2 drops 3 (three) times daily as needed into both eyes for dry eyes.  [name] labetalol (NORMODYNE) 300 MG tablet Take 300 mg by mouth 3 (three) times daily.  [name] losartan (COZAAR) 100 MG tablet Take 1 tablet (100 mg total) by mouth daily.  . [DISCONTINUED] lanthanum (FOSRENOL) 1000 MG chewable tablet Chew 1 tablet (1,000 mg total) by mouth 3 (three) times daily with meals.  . [DISCONTINUED] oxyCODONE-acetaminophen (ROXICET) 5-325 MG tablet Take 1-2 tablets by mouth every 6 (six) hours as needed for moderate pain or severe pain.   No facility-administered encounter medications on file as of 09/07/2017.     Allergies (verified) Patient has no known allergies.   History: Past Medical History:  Diagnosis Date  . Atrial fibrillation ([place])   . Cataract   . Diabetes mellitus without complication ([place])   . Dialysis patient ([place])    Mon-Wed-Friday  . Hypertension   . Renal failure    Past Surgical History:  Procedure Laterality Date  . CAPD INSERTION N/A 06/08/2017   Procedure: LAPAROSCOPIC INSERTION CONTINUOUS AMBULATORY PERITONEAL DIALYSIS  (CAPD) CATHETER;  Surgeon: Delana Meyer,  Dolores Lory, MD;  Location: [facility] ORS;  Service: Vascular;  Laterality: N/A;  . CATARACT EXTRACTION, BILATERAL Bilateral   . DIALYSIS/PERMA CATHETER INSERTION N/A 03/18/2017   Procedure: DIALYSIS/PERMA CATHETER INSERTION;  Surgeon: Katha Cabal, MD;  Location: [facility];  Service: Cardiovascular;  Laterality: N/A;   Family History  Problem Relation Age of Onset  . Hypertension Father    Social History   Socioeconomic History  . Marital status: Single    Spouse name: None  . Number of children: None  . Years of  education: None  . Highest education level: None  Social Needs  . Financial resource strain: None  . Food insecurity - worry: None  . Food insecurity - inability: None  . Transportation needs - medical: None  . Transportation needs - non-medical: None  Occupational History  . None  Tobacco Use  . Smoking status: Never Smoker  . Smokeless tobacco: Never Used  Substance and Sexual Activity  . Alcohol use: No  . Drug use: No  . Sexual activity: None  Other Topics Concern  . None  Social History Narrative  . None    Tobacco Counseling Counseling given: No   Clinical Intake:  Pre-visit preparation completed: Yes  Pain : No/denies pain Pain Score: 0-No pain     Nutritional Status: BMI > 30  Obese Nutritional Risks: None Diabetes: No  How often do you need to have someone help you when you read instructions, pamphlets, or other written materials from your doctor or pharmacy?: 1 - Never What is the last grade level you completed in school?: 6th grade  Interpreter Needed?: No  Comments: pt is a widow and lives alone Information entered by :: LPinson, LPN   Activities of Daily Living In your present state of health, do you have any difficulty performing the following activities: 09/07/2017 04/27/2017  Hearing? N N  Vision? N N  Difficulty concentrating or making decisions? Y N  Walking or climbing stairs? N N  Dressing or bathing? N N  Doing errands, shopping? Y N  Preparing Food and eating ? Y -  Comment does not prepare meals -  Using the Toilet? N -  In the past six months, have you accidently leaked urine? N -  Do you have problems with loss of bowel control? N -  Managing your Medications? Y -  Managing your Finances? N -  Housekeeping or managing your Housekeeping? Y -     Immunizations and Health Maintenance  There is no immunization history on file for this patient. There are no preventive care reminders to display for this patient.  Patient Care  Team: Elby Beck, FNP as PCP - General (Nurse Practitioner)  Indicate any recent Medical Services you may have received from other than [facility] providers in the past year (date may be approximate).     Assessment:   This is a routine wellness examination for Stacy Pham.  Hearing/Vision screen  Hearing Screening   125Hz  250Hz  500Hz  1000Hz  2000Hz  3000Hz  4000Hz  6000Hz  8000Hz   Right ear:   40 40 40  40    Left ear:   40 40 40  40      Visual Acuity Screening   Right eye Left eye Both eyes  Without correction: 20/40 20/100 20/40-1  With correction:       Dietary issues and exercise activities discussed: Current Exercise Habits: The patient does not participate in regular exercise at present(does stretching in bed infrequently), Exercise limited by:  None identified  Goals    . Follow up with Primary Care Provider     Starting 09/07/2017, I will continue to take medications as prescribed and to keep appointments with PCP as scheduled.       Depression Screen PHQ 2/9 Scores 09/07/2017 05/18/2017  PHQ - 2 Score 0 0  PHQ- 9 Score 0 -    Fall Risk Fall Risk  09/07/2017  Falls in the past year? Yes  Comment fell out of bed  Number falls in past yr: 1  Injury with Fall? No    Cognitive Function: MMSE - Mini Mental State Exam 09/07/2017  Orientation to time 5  Orientation to Place 5  Registration 3  Attention/ Calculation 0  Recall 1  Recall-comments unable to recall 1 of 3 words  Language- name 2 objects 0  Language- repeat 1  Language- follow 3 step command 2  Language- follow 3 step command-comments unable to follow 1 step of 3 step command  Language- read & follow direction 0  Write a sentence 0  Copy design 0  Total score 17     PLEASE NOTE: A Mini-Cog screen was completed. Maximum score is 20. A value of 0 denotes this part of [place] MMSE was not completed or the patient failed this part of the Mini-Cog screening.   Mini-Cog Screening Orientation to Time - Max 5  pts Orientation to Place - Max 5 pts Registration - Max 3 pts Recall - Max 3 pts Language Repeat - Max 1 pts Language Follow 3 Step Command - Max 3 pts     Screening Tests Health Maintenance  Topic Date Due  . FOOT EXAM  09/07/2017 (Originally 10/27/1940)  . INFLUENZA VACCINE  10/11/2017 (Originally 02/09/2017)  . TETANUS/TDAP  09/07/2018 (Originally 10/27/1949)  . OPHTHALMOLOGY EXAM  09/08/2018 (Originally 10/27/1940)  . PNA vac Low Risk Adult (1 of 2 - PCV13) 09/08/2018 (Originally 10/26/1997)  . HEMOGLOBIN A1C  11/15/2017  . DEXA SCAN  Completed     Plan:     I have personally reviewed, addressed, and noted the following in the patient's chart:  A. Medical and social history B. Use of alcohol, tobacco or illicit drugs  C. Current medications and supplements D. Functional ability and status E.  Nutritional status F.  Physical activity G. Advance directives H. List of other physicians I.  Hospitalizations, surgeries, and ER visits in previous 12 months J.  [place] to include hearing, vision, cognitive, depression L. Referrals and appointments - none  In addition, I have reviewed and discussed with patient certain preventive protocols, quality metrics, and best practice recommendations. A written personalized care plan for preventive services as well as general preventive health recommendations were provided to patient.  See attached scanned questionnaire for additional information.   Signed,   Lindell Noe, MHA, BS, LPN Health Coach

## 2017-09-07 NOTE — Progress Notes (Signed)
Subjective:    Patient ID: Stacy Pham, female    DOB: [birth year], 84 y.o.   MRN: [id number]  HPI This is an 84 yo female, accompanied by her daughter, who presents today for follow up of diabetes, CKD. Has recently started peritoneal dialysis. Going well. Was advised to start seeing endocrine due to potential effect of blood sugars with PD solution. They bring information today and are concerned that her home glucometer will give false readings due to maltose in PD solution. Having perm cath removed next week.   Sees cardiology for afib.  Patient and daughter report she is doing well, mood is good. Looking forward to warmer and drier weather so she can get out more and walk.   Past Medical History:  Diagnosis Date  . Atrial fibrillation ([place])   . Cataract   . Diabetes mellitus without complication ([place])   . Dialysis patient ([place])    Mon-Wed-Friday  . Hypertension   . Renal failure    Past Surgical History:  Procedure Laterality Date  . CAPD INSERTION N/A 06/08/2017   Procedure: LAPAROSCOPIC INSERTION CONTINUOUS AMBULATORY PERITONEAL DIALYSIS  (CAPD) CATHETER;  Surgeon: Katha Cabal, MD;  Location: [facility] ORS;  Service: Vascular;  Laterality: N/A;  . CATARACT EXTRACTION, BILATERAL Bilateral   . DIALYSIS/PERMA CATHETER INSERTION N/A 03/18/2017   Procedure: DIALYSIS/PERMA CATHETER INSERTION;  Surgeon: Katha Cabal, MD;  Location: [place];  Service: Cardiovascular;  Laterality: N/A;   Family History  Problem Relation Age of Onset  . Hypertension Father    Social History   Tobacco Use  . Smoking status: Never Smoker  . Smokeless tobacco: Never Used  Substance Use Topics  . Alcohol use: No  . Drug use: No      Review of Systems  Constitutional: Negative for fatigue, fever and unexpected weight change.  HENT: Negative.   Respiratory: Positive for shortness of breath (chronic, with exertion). Negative for cough.   Cardiovascular: Negative for chest  pain, palpitations and leg swelling.  Gastrointestinal: Negative for abdominal pain, constipation, diarrhea, nausea and vomiting.  Genitourinary: Positive for difficulty urinating (on dialysis, produces very little urine, wears incontinence briefs. ).  Skin: Negative.   Neurological: Negative for headaches.  Psychiatric/Behavioral: Negative for decreased concentration, dysphoric mood and sleep disturbance. The patient is not nervous/anxious.        Objective:   Physical Exam  Constitutional: She is oriented to person, place, and time. She appears well-developed and well-nourished. No distress.  Obese.   HENT:  Head: Normocephalic and atraumatic.  Mouth/Throat: Oropharynx is clear and moist.  Eyes: Conjunctivae are normal.  Neck: Normal range of motion. Neck supple.  Cardiovascular: Normal rate and normal heart sounds. An irregularly irregular rhythm present.  Pulmonary/Chest: Effort normal and breath sounds normal.  Abdominal: Soft. Bowel sounds are normal. She exhibits no distension. There is no tenderness. There is no rebound and no guarding.  Musculoskeletal: She exhibits no edema.  Neurological: She is alert and oriented to person, place, and time.  Skin: Skin is warm and dry. She is not diaphoretic.  Psychiatric: She has a normal mood and affect. Her behavior is normal. Judgment and thought content normal.  Vitals reviewed.     BP 122/60 (BP Location: Right Arm, Patient Position: Sitting, Cuff Size: Normal)   Pulse 72   Temp 98.1 F (36.7 C) (Oral)   Ht 5' 2.5" (1.588 m) Comment: no shoes  Wt 193 lb 12 oz (87.9 kg)   SpO2 93%  BMI 34.87 kg/m  Wt Readings from Last 3 Encounters:  09/07/17 193 lb 12 oz (87.9 kg)  09/07/17 193 lb 12 oz (87.9 kg)  08/24/17 189 lb (85.7 kg)   BP Readings from Last 3 Encounters:  09/07/17 122/60  09/07/17 122/60  08/24/17 130/60   Depression screen PHQ 2/9 09/07/2017 05/18/2017  Decreased Interest 0 0  Down, Depressed, Hopeless 0 0    PHQ - 2 Score 0 0  Altered sleeping 0 -  Tired, decreased energy 0 -  Change in appetite 0 -  Feeling bad or failure about yourself  0 -  Trouble concentrating 0 -  Moving slowly or fidgety/restless 0 -  Suicidal thoughts 0 -  PHQ-9 Score 0 -  Difficult doing work/chores Not difficult at all -       Assessment & Plan:  1. Type 2 diabetes mellitus with chronic kidney disease on chronic dialysis, without long-term current use of insulin ([place]) - will discuss with endocrinology whether or not she needs to be seen by them as she has been well controlled on no meds  - will also get advice from endocrine on home glucometer needed with PD - atorvastatin (LIPITOR) 20 MG tablet; Take 1 tablet (20 mg total) by mouth every evening.  Dispense: 90 tablet; Refill: 1  2. Essential hypertension - well controlled on current meds - diltiazem (DILACOR XR) 240 MG 24 hr capsule; Take 1 capsule (240 mg total) by mouth daily.  Dispense: 90 capsule; Refill: 1  3. Atrial fibrillation, unspecified type ([place]) - continue apixaban, no current s/s/ bleeding - continue follow up with cardiology  - follow up in 6 months Clarene Reamer, FNP-BC  [facility], [place]  09/11/2017 11:06 AM

## 2017-09-07 NOTE — Progress Notes (Signed)
PCP notes:   Health maintenance:  Foot exam - PCP please address at next appt Eye exam - addressed; PCP request for an eye doctor referral  Bone density - per pt report, screening in 2016  Note: All vaccines postponed until receipt of medical records from previous PCP  Abnormal screenings:   Fall risk - hx of single fall Fall Risk  09/07/2017  Falls in the past year? Yes  Comment fell out of bed  Number falls in past yr: 1  Injury with Fall? No   Mini-Cog score: 17/20 MMSE - Mini Mental State Exam 09/07/2017  Orientation to time 5  Orientation to Place 5  Registration 3  Attention/ Calculation 0  Recall 1  Recall-comments unable to recall 1 of 3 words  Language- name 2 objects 0  Language- repeat 1  Language- follow 3 step command 2  Language- follow 3 step command-comments unable to follow 1 step of 3 step command  Language- read & follow direction 0  Write a sentence 0  Copy design 0  Total score 17        Patient concerns:   Daughter has requested a new glucometer. Will advise PCP of requirement at next appt.   Nurse concerns:  None  Next PCP appt:   09/07/17 @ 1100

## 2017-09-11 ENCOUNTER — Encounter: Payer: Self-pay | Admitting: Family Medicine

## 2017-09-11 NOTE — Progress Notes (Signed)
I reviewed health advisor's note, was available for consultation, and agree with documentation and plan.  

## 2017-09-12 ENCOUNTER — Other Ambulatory Visit: Payer: Self-pay | Admitting: Nephrology

## 2017-09-12 ENCOUNTER — Telehealth: Payer: Self-pay | Admitting: Family Medicine

## 2017-09-12 ENCOUNTER — Ambulatory Visit
Admission: RE | Admit: 2017-09-12 | Discharge: 2017-09-12 | Disposition: A | Discharge status: Home / Self Care | Payer: Medicare Other | Source: Ambulatory Visit | Attending: Nephrology | Admitting: Nephrology

## 2017-09-12 DIAGNOSIS — Y838 Other surgical procedures as the cause of abnormal reaction of the patient, or of later complication, without mention of misadventure at the time of the procedure: Secondary | ICD-10-CM | POA: Insufficient documentation

## 2017-09-12 DIAGNOSIS — T85691A Other mechanical complication of intraperitoneal dialysis catheter, initial encounter: Secondary | ICD-10-CM | POA: Diagnosis present

## 2017-09-12 DIAGNOSIS — T85611A Breakdown (mechanical) of intraperitoneal dialysis catheter, initial encounter: Secondary | ICD-10-CM

## 2017-09-12 NOTE — Telephone Encounter (Signed)
Copied from [place]. Topic: Quick Communication - Rx Refill/Question >> Sep 12, 2017  3:13 PM Scherrie Gerlach wrote: Medication:  Glucometer (one from the list)  Daughter called to follow up on the glucometer Deb Carlean Purl was going to order for the pt. She states they gave this list to Neoma Laming and she was going to find out which one would be best for the pt. They had not heard anything. Daughter states Neoma Laming was going to check with pt's endocrinologist.  [facility] [address] [place] - [place] [phone] (Phone) [phone] (Fax)

## 2017-09-12 NOTE — Telephone Encounter (Signed)
Pt seen 09/07/17.Please advise.

## 2017-09-12 NOTE — Telephone Encounter (Signed)
Please call patient's daughter. I sent her a mychart message several days ago with a couple of questions.  How often are they checking her mother's blood sugar? What is the range of readings?  I don't see any medications for diabetes on her list, is this correct? How often is she having blood work on the peritoneal dialysis?

## 2017-09-13 NOTE — Telephone Encounter (Signed)
Spoke to pts daughter who states she checks pts BS 2-3x per week and readings are in the 90s, and pt is not currently on any DM meds. She also states that the pt does labs once monthly at dialysis. She is needing the glucometer because of the false readings the Extraneal may give. Daughter states that pt does have mychart and its ok to communicate with her; she will be sure to check it

## 2017-09-14 ENCOUNTER — Encounter: Payer: Self-pay | Admitting: Family Medicine

## 2017-09-14 NOTE — Telephone Encounter (Signed)
I sent patient's daughter a Pharmacist, community message.

## 2017-09-15 ENCOUNTER — Ambulatory Visit: Admission: RE | Admit: 2017-09-15 | Payer: Medicare Other | Source: Ambulatory Visit | Admitting: Vascular Surgery

## 2017-09-15 ENCOUNTER — Encounter: Admission: RE | Payer: Self-pay | Source: Ambulatory Visit

## 2017-09-15 SURGERY — DIALYSIS/PERMA CATHETER REMOVAL
Anesthesia: LOCAL

## 2017-09-16 ENCOUNTER — Other Ambulatory Visit: Payer: Self-pay | Admitting: Family Medicine

## 2017-09-16 MED ORDER — BLOOD GLUCOSE MONITOR KIT: PACK | 0 refills | Status: DC

## 2017-09-22 NOTE — Telephone Encounter (Signed)
She is asking for this to be called in not faxed in.

## 2017-09-22 NOTE — Telephone Encounter (Signed)
Pt's daughter calling in stating [facility] has yet to get the fax or to be called about the meter. Daughter is upset that it hasnt been taken care of yet as it takes a couple of days for this meter to come in once the script has been sent in. She states she spoke with Larene [place] and [facility] on Tuesday.

## 2017-09-23 ENCOUNTER — Telehealth: Payer: Self-pay

## 2017-09-23 MED ORDER — BLOOD GLUCOSE MONITOR KIT: PACK | 0 refills | Status: DC

## 2017-09-23 MED ORDER — ONETOUCH VERIO W/DEVICE KIT: 1.0000 | PACK | Freq: Once | 0 refills | Status: AC

## 2017-09-23 MED ORDER — GLUCOSE BLOOD VI STRP: ORAL_STRIP | 6 refills | Status: DC

## 2017-09-23 MED ORDER — ONETOUCH DELICA LANCETS 33G MISC: 6 refills | Status: DC

## 2017-09-23 NOTE — Telephone Encounter (Signed)
Confirmation received by Marita Kansas at [facility].  The One touch verio device, strips and lancets have all been approved through insurance with a 0 dollar copay!  I have notified daugther [name], that everything has gone through and will be ready at the pharmacy within the next 30 minutes for pick up.  Daughter thanks [place] for our diligence in working through this process.

## 2017-09-23 NOTE — Telephone Encounter (Signed)
I spoke with Leticia Penna RN who will call [facility]. I sent a mychart message as well.

## 2017-09-23 NOTE — Telephone Encounter (Signed)
Please refer to refill encounter created on 09/23/17 for details, but appropriate bs meter with supplies has been approved and is waiting for pick up by family.  Daughter, [name], has been notified.

## 2017-09-23 NOTE — Telephone Encounter (Signed)
Order cannot be sent through electronically the way it is written as a "general order".  I am sending through specific orders for the One Touch Verio kit with strips and lancets as this is one of the brands required for peritoneal dialysis patients.  I will call Marita [place] Best boy) to confirm receipt and ability to process with insurance.

## 2017-09-23 NOTE — Telephone Encounter (Signed)
We have had a hard time getting specialty blood sugar meter processed through to the pharmacy.  I have been speaking with the Pharmacy manager Marita [place]) and she has requested that I send the order through electronically and call her within 30 minutes to discuss further.  Currently resubmitting order and will call to verify ability to process appropriately before calling daughter and notifying provider.

## 2017-10-04 ENCOUNTER — Ambulatory Visit: Payer: Medicare Other | Admitting: Physician Assistant

## 2017-10-04 ENCOUNTER — Encounter: Payer: Self-pay | Admitting: Physician Assistant

## 2017-10-04 VITALS — BP 150/56 | HR 65 | Ht 62.0 in | Wt 207.0 lb

## 2017-10-04 DIAGNOSIS — N186 End stage renal disease: Secondary | ICD-10-CM | POA: Diagnosis not present

## 2017-10-04 DIAGNOSIS — I272 Pulmonary hypertension, unspecified: Secondary | ICD-10-CM | POA: Diagnosis not present

## 2017-10-04 DIAGNOSIS — I10 Essential (primary) hypertension: Secondary | ICD-10-CM

## 2017-10-04 DIAGNOSIS — I480 Paroxysmal atrial fibrillation: Secondary | ICD-10-CM

## 2017-10-04 MED ORDER — HYDRALAZINE HCL 50 MG PO TABS: 50.0000 mg | ORAL_TABLET | Freq: Two times a day (BID) | ORAL | 5 refills | Status: DC | PRN

## 2017-10-04 NOTE — Patient Instructions (Addendum)
Medication Instructions:  Your physician has recommended you make the following change in your medication:  1- CHANGE Hydralazine to 50 mg by mouth two times a day AS NEEDED; HOLD FOR blood pressure less than 140/60.  Labwork: none  Testing/Procedures: Your physician has requested that you have an echocardiogram. Echocardiography is a painless test that uses sound waves to create images of your heart. It provides your doctor with information about the size and shape of your heart and how well your heart's chambers and valves are working. This procedure takes approximately one hour. There are no restrictions for this procedure.    Follow-Up: Your physician recommends that you schedule a follow-up appointment in: 3 MONTHS WITH DR END.  If you need a refill on your cardiac medications before your next appointment, please call your pharmacy.   Echocardiogram An echocardiogram, or echocardiography, uses sound waves (ultrasound) to produce an image of your heart. The echocardiogram is simple, painless, obtained within a short period of time, and offers valuable information to your health care provider. The images from an echocardiogram can provide information such as:  Evidence of coronary artery disease (CAD).  Heart size.  Heart muscle function.  Heart valve function.  Aneurysm detection.  Evidence of a past heart attack.  Fluid buildup around the heart.  Heart muscle thickening.  Assess heart valve function.  Tell a health care provider about:  Any allergies you have.  All medicines you are taking, including vitamins, herbs, eye drops, creams, and over-the-counter medicines.  Any problems you or family members have had with anesthetic medicines.  Any blood disorders you have.  Any surgeries you have had.  Any medical conditions you have.  Whether you are pregnant or may be pregnant. What happens before the procedure? No special preparation is needed. Eat and drink  normally. What happens during the procedure?  In order to produce an image of your heart, gel will be applied to your chest and a wand-like tool (transducer) will be moved over your chest. The gel will help transmit the sound waves from the transducer. The sound waves will harmlessly bounce off your heart to allow the heart images to be captured in real-time motion. These images will then be recorded.  You may need an IV to receive a medicine that improves the quality of the pictures. What happens after the procedure? You may return to your normal schedule including diet, activities, and medicines, unless your health care provider tells you otherwise. This information is not intended to replace advice given to you by your health care provider. Make sure you discuss any questions you have with your health care provider. Document Released: 06/25/2000 Document Revised: 02/14/2016 Document Reviewed: 03/05/2013 Elsevier Interactive Patient Education  2017 [employer].

## 2017-10-04 NOTE — Progress Notes (Signed)
Cardiology Office Note Date:  10/04/2017  Patient ID:  Stacy Pham, Stacy Pham 01-19-1931, MRN [id number] PCP:  Elby Beck, FNP  Cardiologist:  Dr. Saunders Revel, MD    Chief Complaint: Follow up  History of Present Illness: Stacy Pham is a 84 y.o. female with history of PAF on Elqiuis, reported moderate pulmonary hypertension, ESRD on PD, CHF (details unknown), DM2, anemia of chronic disease, HTN, OA, and gout who presents for follow up of her Afib.   Patient moved to [facility] from [place] the prior fall. Patient has established with Dr. Saunders Revel as of 08/24/17. Patient reported a hospital admission in 4/18 and 7/18 in [place] due to anemia and renal failure. She was started on HD during these admissions. She also reported being diagnosed with Afib and was started on Eliquis 2.5 mg bid. Since moving to [place], when she was seen in 2/19, she reported feeling relatively well. She also noted at that time she was transitioning from HD to PD.   Records from [place] were reviewed, which indicate PAF with moderate pulmonary hypertension. No reports of invasive or non-invasive cardiac testing were included.   She has now transitioned to PD, though does continue to have intermittent issues with this. Weight has been stable around 202-204 pounds at home. No chest pain, SOB, palpitations, falls, BRBPR, or melena. Home BP has been somewhat labile with readings in the 160s to low 160V systolic. Patient's daughter often times does not give the patient her 3rd dose of hydralazine due to relative hypotension with associated dizziness. Lower extremity swelling stable.    Past Medical History:  Diagnosis Date  . Atrial fibrillation ([place])   . Cataract   . Diabetes mellitus without complication ([place])   . Dialysis patient ([place])    2/19- changed from hemodialysis to nightly peritoneal  . Hypertension   . Renal failure     Past Surgical History:  Procedure Laterality Date  . CAPD INSERTION N/A 06/08/2017   Procedure: LAPAROSCOPIC  INSERTION CONTINUOUS AMBULATORY PERITONEAL DIALYSIS  (CAPD) CATHETER;  Surgeon: Katha Cabal, MD;  Location: [facility] ORS;  Service: Vascular;  Laterality: N/A;  . CATARACT EXTRACTION, BILATERAL Bilateral   . DIALYSIS/PERMA CATHETER INSERTION N/A 03/18/2017   Procedure: DIALYSIS/PERMA CATHETER INSERTION;  Surgeon: Katha Cabal, MD;  Location: [facility];  Service: Cardiovascular;  Laterality: N/A;    Current Meds  Medication Sig  . acetaminophen (TYLENOL 8 HOUR ARTHRITIS PAIN) 650 MG CR tablet Take 650 mg every 8 (eight) hours as needed by mouth for pain.  [name] allopurinol (ZYLOPRIM) 100 MG tablet Take 100 mg by mouth daily.  [name] apixaban (ELIQUIS) 2.5 MG TABS tablet Take 1 tablet (2.5 mg total) by mouth 2 (two) times daily.  [name] atorvastatin (LIPITOR) 20 MG tablet Take 1 tablet (20 mg total) by mouth every evening.  . blood glucose meter kit and supplies KIT Please dispense either One Touch or Bayer Contour She must have one of these machines since she is on peritoneal dialysis. Use up to four times daily as directed. (FOR ICD-9 250.00, 250.01).  . calcium acetate (PHOSLO) 667 MG capsule Take 667 mg by mouth 3 (three) times daily with meals.   . cinacalcet (SENSIPAR) 30 MG tablet Take 1 tablet (30 mg total) by mouth daily with supper. (Patient taking differently: Take 30 mg by mouth daily. With largest meal)  . colchicine 0.6 MG tablet Take 1 tablet (0.6 mg total) by mouth daily as needed (for gout flare ups).  [name]  diltiazem (DILACOR XR) 240 MG 24 hr capsule Take 1 capsule (240 mg total) by mouth daily.  . fluticasone (FLONASE) 50 MCG/ACT nasal spray Place 1 spray into both nostrils daily as needed for allergies or rhinitis.  [name] gabapentin (NEURONTIN) 100 MG capsule Take 100 mg by mouth 3 (three) times daily.  [name] glucose blood (ONETOUCH VERIO) test strip Test blood sugar four times daily. Dx 250.00, 250.01, E11.22, N18.6  . hydrALAZINE (APRESOLINE) 100 MG tablet Take 1 tablet (100 mg total)  by mouth 3 (three) times daily.  . hydroxypropyl methylcellulose / hypromellose (ISOPTO TEARS / GONIOVISC) 2.5 % ophthalmic solution Place 1-2 drops 3 (three) times daily as needed into both eyes for dry eyes.  [name] labetalol (NORMODYNE) 300 MG tablet Take 300 mg by mouth 3 (three) times daily.  [name] losartan (COZAAR) 100 MG tablet Take 1 tablet (100 mg total) by mouth daily.  Glory Rosebush DELICA LANCETS 21H MISC Test blood sugar four times daily. Dx 250.00, 250.01, E11.22, N18.6    Allergies:   Patient has no known allergies.   Social History:  The patient  reports that she has never smoked. She has never used smokeless tobacco. She reports that she does not drink alcohol or use drugs.   Family History:  The patient's family history includes Hypertension in her father.  ROS:   Review of Systems  Constitutional: Positive for malaise/fatigue. Negative for chills, diaphoresis, fever and weight loss.  HENT: Negative for congestion.   Eyes: Negative for discharge and redness.  Respiratory: Negative for cough, hemoptysis, sputum production, shortness of breath and wheezing.   Cardiovascular: Positive for leg swelling. Negative for chest pain, palpitations, orthopnea, claudication and PND.  Gastrointestinal: Negative for abdominal pain, blood in stool, heartburn, melena, nausea and vomiting.  Genitourinary: Negative for hematuria.  Musculoskeletal: Negative for falls and myalgias.  Skin: Negative for rash.  Neurological: Negative for dizziness, tingling, tremors, sensory change, speech change, focal weakness, loss of consciousness and weakness.  Endo/Heme/Allergies: Does not bruise/bleed easily.  Psychiatric/Behavioral: Negative for substance abuse. The patient is not nervous/anxious.   All other systems reviewed and are negative.    PHYSICAL EXAM:  VS:  BP (!) 150/56 (BP Location: Left Arm, Patient Position: Sitting, Cuff Size: Normal)   Pulse 65   Ht 5' 2" (1.575 m)   Wt 207 lb (93.9 kg)   BMI  37.86 kg/m  BMI: Body mass index is 37.86 kg/m.  Physical Exam  Constitutional: She is oriented to person, place, and time. She appears well-developed and well-nourished.  HENT:  Head: Normocephalic and atraumatic.  Eyes: Right eye exhibits no discharge. Left eye exhibits no discharge.  Neck: Normal range of motion. No JVD present.  Cardiovascular: Normal rate, regular rhythm, S1 normal, S2 normal and normal heart sounds. Exam reveals no distant heart sounds, no friction rub, no midsystolic click and no opening snap.  No murmur heard. Pulses:      Posterior tibial pulses are 2+ on the right side, and 2+ on the left side.  Pulmonary/Chest: Effort normal and breath sounds normal. No respiratory distress. She has no decreased breath sounds. She has no wheezes. She has no rales. She exhibits no tenderness.  Abdominal: Soft. She exhibits no distension. There is no tenderness.  Musculoskeletal: She exhibits edema.  Trace to 1+ bilateral pitting edema to the mid shins.   Neurological: She is alert and oriented to person, place, and time.  Skin: Skin is warm and dry. No cyanosis. Nails show no  clubbing.  Psychiatric: She has a normal mood and affect. Her speech is normal and behavior is normal. Judgment and thought content normal.     EKG:  Was ordered and interpreted by me today. Shows NSR, 65 bpm, left axis deviation, 1st degree AV block, possible prior anterior infarct, poor R wave progression  Recent Labs: 03/18/2017: ALT 16 06/08/2017: BUN 73; Creatinine, Ser 12.05; Hemoglobin 13.6; Platelets 290; Potassium 4.3; Sodium 140  No results found for requested labs within last 8760 hours.   CrCl cannot be calculated (Patient's most recent lab result is older than the maximum 21 days allowed.).   Wt Readings from Last 3 Encounters:  10/04/17 207 lb (93.9 kg)  09/07/17 193 lb 12 oz (87.9 kg)  09/07/17 193 lb 12 oz (87.9 kg)     Other studies reviewed: Additional studies/records reviewed  today include: summarized above  ASSESSMENT AND PLAN:  1. PAF: Remains in sinus rhythm at this time. Attempt to obtain study documenting she was in Afib in [place]. Per pharmacy, ok to continue Eliquis 2.5 mg bid (meets 2/3 reduced dosing criteria). Risks and benefits of anticoagulation discussed. Patient and daughter would like to continue. Continue labetalol for rate control.   2. Heart failure/pulmonary hypertension: She does not appear grossly volume overloaded at this time. Volume managed per dialysis. Check echo. Attempt to acquire study from [place] for comparison. Continue current medication.   3. ESRD: On PD.   4. HTN: Blood pressure has been labile with readings from the 160s to low 737T systolic. Patient's daughter many times does not give the third dose of hydralazine due to soft BP associated with the patient's PD. We have agreed to allow for more permissive BP at this time. Decrease hydralazine to 50 mg bid prn for BP > 140/60. Continue labetalol and losartan per PCP and nephrology. Defer further management to PCP and nephrology.   Disposition: F/u with Dr. Saunders Revel in 3 months.   Current medicines are reviewed at length with the patient today.  The patient did not have any concerns regarding medicines.  Signed, Christell Faith, PA-C 10/04/2017 2:29 PM     [address] [place], [place] [address] [phone]

## 2017-10-12 ENCOUNTER — Encounter: Payer: Self-pay | Admitting: Family Medicine

## 2017-10-12 MED ORDER — COLCHICINE 0.6 MG PO TABS: 0.6000 mg | ORAL_TABLET | Freq: Every day | ORAL | 1 refills | Status: DC | PRN

## 2017-10-12 MED ORDER — GABAPENTIN 100 MG PO CAPS: 100.0000 mg | ORAL_CAPSULE | Freq: Three times a day (TID) | ORAL | 4 refills | Status: DC

## 2017-10-12 MED ORDER — ALLOPURINOL 100 MG PO TABS: 100.0000 mg | ORAL_TABLET | Freq: Every day | ORAL | 1 refills | Status: DC

## 2017-10-12 MED ORDER — LABETALOL HCL 300 MG PO TABS: 300.0000 mg | ORAL_TABLET | Freq: Three times a day (TID) | ORAL | 4 refills | Status: DC

## 2017-10-17 ENCOUNTER — Other Ambulatory Visit (INDEPENDENT_AMBULATORY_CARE_PROVIDER_SITE_OTHER): Payer: Self-pay | Admitting: Vascular Surgery

## 2017-10-20 ENCOUNTER — Encounter
Admission: RE | Disposition: A | Discharge status: Home / Self Care | Payer: Self-pay | Source: Ambulatory Visit | Attending: Vascular Surgery

## 2017-10-20 ENCOUNTER — Ambulatory Visit
Admission: RE | Admit: 2017-10-20 | Discharge: 2017-10-20 | Disposition: A | Discharge status: Home / Self Care | Payer: Medicare Other | Source: Ambulatory Visit | Attending: Vascular Surgery | Admitting: Vascular Surgery

## 2017-10-20 DIAGNOSIS — I4891 Unspecified atrial fibrillation: Secondary | ICD-10-CM | POA: Diagnosis not present

## 2017-10-20 DIAGNOSIS — Z7901 Long term (current) use of anticoagulants: Secondary | ICD-10-CM | POA: Insufficient documentation

## 2017-10-20 DIAGNOSIS — Z452 Encounter for adjustment and management of vascular access device: Secondary | ICD-10-CM | POA: Diagnosis present

## 2017-10-20 DIAGNOSIS — Z992 Dependence on renal dialysis: Secondary | ICD-10-CM | POA: Insufficient documentation

## 2017-10-20 DIAGNOSIS — N186 End stage renal disease: Secondary | ICD-10-CM | POA: Insufficient documentation

## 2017-10-20 DIAGNOSIS — E1122 Type 2 diabetes mellitus with diabetic chronic kidney disease: Secondary | ICD-10-CM | POA: Diagnosis not present

## 2017-10-20 DIAGNOSIS — I120 Hypertensive chronic kidney disease with stage 5 chronic kidney disease or end stage renal disease: Secondary | ICD-10-CM | POA: Diagnosis not present

## 2017-10-20 DIAGNOSIS — E119 Type 2 diabetes mellitus without complications: Secondary | ICD-10-CM | POA: Diagnosis not present

## 2017-10-20 DIAGNOSIS — I10 Essential (primary) hypertension: Secondary | ICD-10-CM | POA: Diagnosis not present

## 2017-10-20 HISTORY — PX: DIALYSIS/PERMA CATHETER REMOVAL: CATH118289

## 2017-10-20 SURGERY — DIALYSIS/PERMA CATHETER REMOVAL
Anesthesia: LOCAL

## 2017-10-20 MED ORDER — LIDOCAINE-EPINEPHRINE (PF) 1 %-1:200000 IJ SOLN
INTRAMUSCULAR | Status: DC | PRN
  Administered 2017-10-20: 20 mL

## 2017-10-20 SURGICAL SUPPLY — 2 items
FORCEPS HALSTEAD CVD 5IN STRL (INSTRUMENTS) ×2 IMPLANT
TRAY LACERAT/PLASTIC (MISCELLANEOUS) ×2 IMPLANT

## 2017-10-20 NOTE — H&P (Signed)
[facility] Admission History & Physical  MRN : [id number]  Stacy Pham is a 84 y.o. (1931-06-14) female who presents with chief complaint of No chief complaint on file. [name]  History of Present Illness: I am asked to evaluate the patient by the dialysis center. The patient was sent here because they have a nonfunctioning tunneled catheter and a functioning PD catheter.  The patient reports they're not been any problems with any of their dialysis runs. They are reporting good flows with good parameters at dialysis.  Patient denies pain or tenderness overlying the access.  There is no pain with dialysis.  The patient denies hand pain or finger pain consistent with steal syndrome.  No fevers or chills while on dialysis.   No current facility-administered medications for this encounter.     Past Medical History:  Diagnosis Date  . Atrial fibrillation ([place])   . Cataract   . Diabetes mellitus without complication ([place])   . Dialysis patient ([place])    2/19- changed from hemodialysis to nightly peritoneal  . Hypertension   . Renal failure     Past Surgical History:  Procedure Laterality Date  . CAPD INSERTION N/A 06/08/2017   Procedure: LAPAROSCOPIC INSERTION CONTINUOUS AMBULATORY PERITONEAL DIALYSIS  (CAPD) CATHETER;  Surgeon: Katha Cabal, MD;  Location: [facility] ORS;  Service: Vascular;  Laterality: N/A;  . CATARACT EXTRACTION, BILATERAL Bilateral   . DIALYSIS/PERMA CATHETER INSERTION N/A 03/18/2017   Procedure: DIALYSIS/PERMA CATHETER INSERTION;  Surgeon: Katha Cabal, MD;  Location: [facility];  Service: Cardiovascular;  Laterality: N/A;    Social History Social History   Tobacco Use  . Smoking status: Never Smoker  . Smokeless tobacco: Never Used  Substance Use Topics  . Alcohol use: No  . Drug use: No    Family History Family History  Problem Relation Age of Onset  . Hypertension Father     No family history of bleeding or  clotting disorders, autoimmune disease or porphyria  No Known Allergies   REVIEW OF SYSTEMS (Negative unless checked)  Constitutional: [] Weight loss  [] Fever  [] Chills Cardiac: [] Chest pain   [] Chest pressure   [] Palpitations   [] Shortness of breath when laying flat   [] Shortness of breath at rest   [x] Shortness of breath with exertion. Vascular:  [] Pain in legs with walking   [] Pain in legs at rest   [] Pain in legs when laying flat   [] Claudication   [] Pain in feet when walking  [] Pain in feet at rest  [] Pain in feet when laying flat   [] History of DVT   [] Phlebitis   [] Swelling in legs   [] Varicose veins   [] Non-healing ulcers Pulmonary:   [] Uses home oxygen   [] Productive cough   [] Hemoptysis   [] Wheeze  [] COPD   [] Asthma Neurologic:  [] Dizziness  [] Blackouts   [] Seizures   [] History of stroke   [] History of TIA  [] Aphasia   [] Temporary blindness   [] Dysphagia   [] Weakness or numbness in arms   [] Weakness or numbness in legs Musculoskeletal:  [x] Arthritis   [] Joint swelling   [] Joint pain   [] Low back pain Hematologic:  [] Easy bruising  [] Easy bleeding   [] Hypercoagulable state   [] Anemic  [] Hepatitis Gastrointestinal:  [] Blood in stool   [] Vomiting blood  [] Gastroesophageal reflux/heartburn   [] Difficulty swallowing. Genitourinary:  [x] Chronic kidney disease   [] Difficult urination  [] Frequent urination  [] Burning with urination   [] Blood in urine Skin:  [] Rashes   [] Ulcers   []   Wounds Psychological:  [] History of anxiety   []  History of major depression.  Physical Examination  There were no vitals filed for this visit. There is no height or weight on file to calculate BMI. Gen: WD/WN, NAD Head: /AT, No temporalis wasting. Prominent temp pulse not noted. Ear/Nose/Throat: Hearing grossly intact, nares w/o erythema or drainage, oropharynx w/o Erythema/Exudate,  Eyes: Conjunctiva clear, sclera non-icteric Neck: Trachea midline.  No JVD.  Pulmonary:  Good air movement, respirations not  labored, no use of accessory muscles.  Cardiac: irregular Vascular: permcath in subclavicular location, PD catheter in place Vessel Right Left  Radial Palpable Palpable               Musculoskeletal: M/S 5/5 throughout.  Extremities without ischemic changes.  No deformity or atrophy.  Neurologic: Sensation grossly intact in extremities.  Symmetrical.  Speech is fluent. Motor exam as listed above. Psychiatric: Judgment intact, Mood & affect appropriate for pt's clinical situation. Dermatologic: No rashes or ulcers noted.  No cellulitis or open wounds.    CBC Lab Results  Component Value Date   WBC 7.3 06/08/2017   HGB 13.6 06/08/2017   HCT 40.0 06/08/2017   MCV 100.5 (H) 06/08/2017   PLT 290 06/08/2017    BMET    Component Value Date/Time   NA 140 06/08/2017 1311   K 4.3 06/08/2017 1311   CL 99 (L) 06/08/2017 1250   CO2 23 06/08/2017 1250   GLUCOSE 100 (H) 06/08/2017 1311   BUN 73 (H) 06/08/2017 1250   CREATININE 12.05 (H) 06/08/2017 1250   CALCIUM 9.2 06/08/2017 1250   GFRNONAA 2 (L) 06/08/2017 1250   GFRAA 3 (L) 06/08/2017 1250   CrCl cannot be calculated (Patient's most recent lab result is older than the maximum 21 days allowed.).  COAG Lab Results  Component Value Date   INR 1.08 06/08/2017   INR 1.09 04/22/2017   INR 1.22 03/18/2017    Radiology No results found.  Assessment/Plan 1.  Complication dialysis device with non-functional catheter and working PD catheter:  Patient's Tunneled catheter is not being used. The patient has a PD catheter that is functioning well. Therefore, the patient will undergo removal of the tunneled catheter under local anesthesia.  The risks and benefits were described to the patient.  All questions were answered.  The patient agrees to proceed with angiography and intervention. Potassium will be drawn to ensure that it is an appropriate level prior to performing intervention. 2.  End-stage renal disease requiring hemodialysis:   Patient will continue dialysis therapy without further interruption. Dialysis has already been arranged. 3.  Hypertension:  Patient will continue medical management; nephrology is following no changes in oral medications. 4. Diabetes mellitus:  Glucose will be monitored and oral medications been held this morning once the patient has undergone the patient's procedure po intake will be reinitiated and again Accu-Cheks will be used to assess the blood glucose level and treat as needed. The patient will be restarted on the patient's usual hypoglycemic regime     Leotis Pain, MD  10/20/2017 11:09 AM

## 2017-10-20 NOTE — Discharge Instructions (Signed)
Tunneled Catheter Removal, Care After °Refer to this sheet in the next few weeks. These instructions provide you with information about caring for yourself after your procedure. Your health care provider may also give you more specific instructions. Your treatment has been planned according to current medical practices, but problems sometimes occur. Call your health care provider if you have any problems or questions after your procedure. °What can I expect after the procedure? °After the procedure, it is common to have: °· Some mild redness, swelling, and pain around your catheter site. ° ° °Follow these instructions at home: °Incision care  °· Check your removal site  every day for signs of infection. Check for: °¨ More redness, swelling, or pain. °¨ More fluid or blood. °¨ Warmth. °¨ Pus or a bad smell. °· Follow instructions from your health care provider about how to take care of your removal site. Make sure you: °¨ Wash your hands with soap and water before you change your bandages (dressings). If soap and water are not available, use hand sanitizer. °Activity  °· Return to your normal activities as told by your health care provider. Ask your health care provider what activities are safe for you. °· Do not lift anything that is heavier than 10 lb (4.5 kg) for 3 weeks or as long as told by your health care provider. ° °Contact a health care provider if: °· You have more fluid or blood coming from your removal site °· You have more redness, swelling, or pain at your incisions or around the area where your catheter was removed °· Your removal site feel warm to the touch. °· You feel unusually weak. °· You feel nauseous.. °· Get help right away if °· You have swelling in your arm, shoulder, neck, or face. °· You develop chest pain. °· You have difficulty breathing. °· You feel dizzy or light-headed. °· You have pus or a bad smell coming from your removal site °· You have a fever. °· You develop bleeding from your  removal site, and your bleeding does not stop. °This information is not intended to replace advice given to you by your health care provider. Make sure you discuss any questions you have with your health care provider. °Document Released: 06/14/2012 Document Revised: 02/29/2016 Document Reviewed: 03/24/2015 °Elsevier Interactive Patient Education © 2017 [employer] ° °

## 2017-10-20 NOTE — Op Note (Signed)
Operative Note     Preoperative diagnosis:   1. ESRD with functional permanent access  Postoperative diagnosis:  1. ESRD with functional permanent access  Procedure:  Removal of left jugular Permcath  Surgeon:  Leotis Pain, MD  Anesthesia:  Local  EBL:  Minimal  Indication for the Procedure:  The patient has a functional permanent dialysis access (PD catheter) and no longer needs their permcath.  This can be removed.  Risks and benefits are discussed and informed consent is obtained.  Description of the Procedure:  The patient's left neck, chest and existing catheter were sterilely prepped and draped. The area around the catheter was anesthetized copiously with 1% lidocaine. The catheter was dissected out with curved hemostats until the cuff was freed from the surrounding fibrous sheath. The fiber sheath was transected, and the catheter was then removed in its entirety using gentle traction. Pressure was held and sterile dressings were placed. The patient tolerated the procedure well and was taken to the recovery room in stable condition.     Leotis Pain  10/20/2017, 11:18 AM This note was created with Dragon Medical transcription system. Any errors in dictation are purely unintentional.

## 2017-10-20 NOTE — Progress Notes (Signed)
Pt brought into vir lab via W/C. Placed on cart for perm cath dc. No beds available in pre procedure area. Clothing removed, consent obtained, pre op completed in vir lab.  28 Dr Lucky Cowboy removing perm cath at this time

## 2017-10-24 ENCOUNTER — Other Ambulatory Visit: Payer: Self-pay

## 2017-10-24 ENCOUNTER — Ambulatory Visit (INDEPENDENT_AMBULATORY_CARE_PROVIDER_SITE_OTHER): Payer: Medicare Other

## 2017-10-24 DIAGNOSIS — I480 Paroxysmal atrial fibrillation: Secondary | ICD-10-CM | POA: Diagnosis not present

## 2017-10-24 DIAGNOSIS — I272 Pulmonary hypertension, unspecified: Secondary | ICD-10-CM

## 2017-11-21 ENCOUNTER — Encounter: Payer: Self-pay | Admitting: Family Medicine

## 2017-12-27 NOTE — Progress Notes (Signed)
Follow-up Outpatient Visit Date: 12/28/2017  Primary Care Provider: Elby Beck, [place] [address]  Chief Complaint: Follow-up hypertension and atrial fibrillation  HPI:  Stacy Pham is a 84 y.o. year-old female with history of atrial fibrillation, diabetes mellitus, hypertension, and ESRD, who presents for follow-up of atrial fibrillation and heart failure with preserved ejection fraction.  I met Stacy Pham in February, at which time she wished to establish cardiac care in the area after moving from [place].  Since that time, she has transitioned from hemodialysis to peritoneal dialysis.  She was seen in follow-up by Christell Faith, PA, in March.  At that time, she continued to do well.  No changes were made to her medications.  Today, Stacy Pham reports that she has been feeling well.  She is now using peritoneal dialysis without difficulty.  Her leg edema has resolved.  She denies chest pain, shortness of breath, palpitations, and lightheadedness.  Her daughter notes that the patient's blood pressures have actually been lower since starting peritoneal dialysis.  She is now using labetalol only once daily and winds up holding diltiazem about half of the days per week due to systolic blood pressures around 90 to 100 mmHg.  She is also no longer using hydralazine.  She has not had any significant bleeding, remaining on low-dose apixaban.  --------------------------------------------------------------------------------------------------  Past Medical History:  Diagnosis Date  . Atrial fibrillation ([place])   . Cataract   . Diabetes mellitus without complication ([place])   . Dialysis patient ([place])    2/19- changed from hemodialysis to nightly peritoneal  . Hypertension   . Renal failure    Past Surgical History:  Procedure Laterality Date  . CAPD INSERTION N/A 06/08/2017   Procedure: LAPAROSCOPIC INSERTION CONTINUOUS AMBULATORY PERITONEAL DIALYSIS  (CAPD)  CATHETER;  Surgeon: Katha Cabal, MD;  Location: [facility];  Service: Vascular;  Laterality: N/A;  . CATARACT EXTRACTION, BILATERAL Bilateral   . DIALYSIS/PERMA CATHETER INSERTION N/A 03/18/2017   Procedure: DIALYSIS/PERMA CATHETER INSERTION;  Surgeon: Katha Cabal, MD;  Location: [facility];  Service: Cardiovascular;  Laterality: N/A;  . DIALYSIS/PERMA CATHETER REMOVAL N/A 10/20/2017   Procedure: DIALYSIS/PERMA CATHETER REMOVAL;  Surgeon: Algernon Huxley, MD;  Location: [facility];  Service: Cardiovascular;  Laterality: N/A;    Current Meds  Medication Sig  . acetaminophen (TYLENOL 8 HOUR ARTHRITIS PAIN) 650 MG CR tablet Take 650 mg every 8 (eight) hours as needed by mouth for pain.  [name] allopurinol (ZYLOPRIM) 100 MG tablet Take 1 tablet (100 mg total) by mouth daily.  [name] apixaban (ELIQUIS) 2.5 MG TABS tablet Take 1 tablet (2.5 mg total) by mouth 2 (two) times daily.  [name] atorvastatin (LIPITOR) 20 MG tablet Take 1 tablet (20 mg total) by mouth every evening.  . blood glucose meter kit and supplies KIT Please dispense either One Touch or Bayer Contour She must have one of these machines since she is on peritoneal dialysis. Use up to four times daily as directed. (FOR ICD-9 250.00, 250.01).  . cinacalcet (SENSIPAR) 30 MG tablet Take 1 tablet (30 mg total) by mouth daily with supper. (Patient taking differently: Take 30 mg by mouth daily. With largest meal)  . colchicine 0.6 MG tablet Take 1 tablet (0.6 mg total) by mouth daily as needed (for gout flare ups).  . ferric citrate (AURYXIA) 1 GM 210 MG(Fe) tablet Take 210 mg by mouth 3 (three) times daily with meals.  [name]  fluticasone (FLONASE) 50 MCG/ACT nasal spray Place 1 spray into both nostrils daily as needed for allergies or rhinitis.  [name] gabapentin (NEURONTIN) 100 MG capsule Take 1 capsule (100 mg total) by mouth 3 (three) times daily.  [name] glucose blood (ONETOUCH VERIO) test strip Test blood sugar four times daily. Dx 250.00,  250.01, E11.22, N18.6  . hydroxypropyl methylcellulose / hypromellose (ISOPTO TEARS / GONIOVISC) 2.5 % ophthalmic solution Place 1-2 drops 3 (three) times daily as needed into both eyes for dry eyes.  [name] losartan (COZAAR) 100 MG tablet Take 1 tablet (100 mg total) by mouth daily.  Glory Rosebush DELICA LANCETS 03T MISC Test blood sugar four times daily. Dx 250.00, 250.01, E11.22, N18.6  . [DISCONTINUED] diltiazem (DILACOR XR) 240 MG 24 hr capsule Take 1 capsule (240 mg total) by mouth daily.  . [DISCONTINUED] hydrALAZINE (APRESOLINE) 50 MG tablet Take 1 tablet (50 mg total) by mouth 2 (two) times daily as needed.  . [DISCONTINUED] labetalol (NORMODYNE) 300 MG tablet Take 1 tablet (300 mg total) by mouth 3 (three) times daily.    Allergies: Patient has no known allergies.  Social History   Tobacco Use  . Smoking status: Never Smoker  . Smokeless tobacco: Never Used  Substance Use Topics  . Alcohol use: No  . Drug use: No    Family History  Problem Relation Age of Onset  . Hypertension Father     Review of Systems: A 12-system review of systems was performed and was negative except as noted in the HPI.  --------------------------------------------------------------------------------------------------  Physical Exam: BP (!) 142/64 (BP Location: Right Arm, Patient Position: Sitting, Cuff Size: Normal)   Pulse 78   Ht _0  (1.575 m)   Wt 193 lb (87.5 kg)   BMI 35.30 kg/m   General: NAD. HEENT: No conjunctival pallor or scleral icterus. Moist mucous membranes.  OP clear. Neck: Supple without lymphadenopathy, thyromegaly, JVD, or HJR. Lungs: Normal work of breathing. Clear to auscultation bilaterally without wheezes or crackles. Heart: Regular rate and rhythm without murmurs, rubs, or gallops. Non-displaced PMI. Abd: Bowel sounds present.  Soft and nontender.  Mildly distended.  Unable to assess HSM due to body habitus. Ext: Trace mid calf edema bilaterally. Skin: Warm and dry  without rash.  Echo (10/24/2017): Normal LV size with mild LVH.  LVEF 60 to 65% with grade 1 diastolic dysfunction.  Mitral annular calcification with mild regurgitation.  Mild left atrial enlargement.  Mild right atrial enlargement.  Normal RV size and function.  Mild pulmonary hypertension (PASP 40 mmHg).  Lab Results  Component Value Date   WBC 7.3 06/08/2017   HGB 13.6 06/08/2017   HCT 40.0 06/08/2017   MCV 100.5 (H) 06/08/2017   PLT 290 06/08/2017    Lab Results  Component Value Date   NA 140 06/08/2017   K 4.3 06/08/2017   CL 99 (L) 06/08/2017   CO2 23 06/08/2017   BUN 73 (H) 06/08/2017   CREATININE 12.05 (H) 06/08/2017   GLUCOSE 100 (H) 06/08/2017   ALT 16 03/18/2017    No results found for: CHOL, HDL, LDLCALC, LDLDIRECT, TRIG, CHOLHDL  --------------------------------------------------------------------------------------------------  ASSESSMENT AND PLAN: Proximal atrial fibrillation No symptoms to suggest recurrence.  We will continue with apixaban 2.5 mg twice daily, given Abdimalik Mayorquin-stage renal disease and age greater than 82.  In the setting of soft blood pressures and intermittent use of diltiazem and labetalol, we have agreed to stop these 2 agents and begin carvedilol, as outlined below.  HFpEF Ms.  Rogel appears euvolemic and well compensated on exam.  Continue volume management via peritoneal dialysis, as directed by nephrology.  Hypertension Blood pressure mildly elevated today, though Stacy Pham has not taken any of her medications thus far today.  Her daughter reports that home blood pressures have actually been running on the low side, prompting her to hold some of her medications at times.  We have agreed to continue losartan 100 mg daily and initiate carvedilol 6.25 mg twice daily.  We will discontinue diltiazem, as needed hydralazine, and labetalol.  I will have Stacy Pham return in about 2 weeks for a blood pressure check.  I asked her to bring her blood pressure  cuff along at that time so that we can correlate it with our readings.  Follow-up: Pressure check in 2 weeks.  Return to clinic to see me in 4 months.  Nelva Bush, MD 12/28/2017 12:47 PM

## 2017-12-28 ENCOUNTER — Ambulatory Visit: Payer: Medicare Other | Admitting: Internal Medicine

## 2017-12-28 ENCOUNTER — Encounter: Payer: Self-pay | Admitting: Family Medicine

## 2017-12-28 ENCOUNTER — Encounter: Payer: Self-pay | Admitting: Internal Medicine

## 2017-12-28 ENCOUNTER — Ambulatory Visit (INDEPENDENT_AMBULATORY_CARE_PROVIDER_SITE_OTHER): Payer: Medicare Other | Admitting: Family Medicine

## 2017-12-28 VITALS — BP 134/64 | HR 77 | Temp 98.3°F | Ht 62.0 in | Wt 191.5 lb

## 2017-12-28 VITALS — BP 142/64 | HR 78 | Ht 62.0 in | Wt 193.0 lb

## 2017-12-28 DIAGNOSIS — L609 Nail disorder, unspecified: Secondary | ICD-10-CM | POA: Diagnosis not present

## 2017-12-28 DIAGNOSIS — I480 Paroxysmal atrial fibrillation: Secondary | ICD-10-CM | POA: Diagnosis not present

## 2017-12-28 DIAGNOSIS — I5032 Chronic diastolic (congestive) heart failure: Secondary | ICD-10-CM | POA: Diagnosis not present

## 2017-12-28 DIAGNOSIS — I10 Essential (primary) hypertension: Secondary | ICD-10-CM | POA: Diagnosis not present

## 2017-12-28 MED ORDER — CARVEDILOL 6.25 MG PO TABS: 6.2500 mg | ORAL_TABLET | Freq: Two times a day (BID) | ORAL | 4 refills | Status: DC

## 2017-12-28 NOTE — Patient Instructions (Signed)
Medication Instructions:  Your physician has recommended you make the following change in your medication:  1- STOP Hydralazine. 2- STOP Diltiazem. 3- STOP Labetalol. 4- START Carvedilol 6.25 mg ( 1 tablet) by mouth two times a day.   Labwork: NONE  Testing/Procedures: NONE  Follow-Up: Your physician recommends that you schedule a follow-up appointment in: 2 WEEKS FOR NURSE VISIT TO CHECK YOUR BLOOD PRESSURE. PLEASE BRING YOUR BLOOD PRESSURE CUFF FROM HOME WITH YOU.  Your physician recommends that you schedule a follow-up appointment in: 4 MONTHS WITH DR END.  If you need a refill on your cardiac medications before your next appointment, please call your pharmacy.

## 2017-12-28 NOTE — Patient Instructions (Signed)
Good to see you today  Can do epson salt soaks as needed and let me know if you develop any redness or drainage

## 2017-12-28 NOTE — Progress Notes (Signed)
   Subjective:    Patient ID: Stacy Pham, female    DOB: Nov 09, 1930, 84 y.o.   MRN: [id number]  HPI This is an 84 yo female, accompanied by her daughter. She has left middle finger pain around her nail for about 1.5 months. She noticed this after having nails done. Only hurts when she presses on nail bed. Nail is growing out a little thick. Pain has improved over time.  Has been doing well. Saw cardiology today and had some change in meds. No swelling.  Peritoneal dialysis going well.  Getting around well, rarely uses walker.   Past Medical History:  Diagnosis Date  . Atrial fibrillation ([place])   . Cataract   . Diabetes mellitus without complication ([place])   . Dialysis patient ([place])    2/19- changed from hemodialysis to nightly peritoneal  . Hypertension   . Renal failure    Past Surgical History:  Procedure Laterality Date  . CAPD INSERTION N/A 06/08/2017   Procedure: LAPAROSCOPIC INSERTION CONTINUOUS AMBULATORY PERITONEAL DIALYSIS  (CAPD) CATHETER;  Surgeon: Katha Cabal, MD;  Location: [facility];  Service: Vascular;  Laterality: N/A;  . CATARACT EXTRACTION, BILATERAL Bilateral   . DIALYSIS/PERMA CATHETER INSERTION N/A 03/18/2017   Procedure: DIALYSIS/PERMA CATHETER INSERTION;  Surgeon: Katha Cabal, MD;  Location: [facility];  Service: Cardiovascular;  Laterality: N/A;  . DIALYSIS/PERMA CATHETER REMOVAL N/A 10/20/2017   Procedure: DIALYSIS/PERMA CATHETER REMOVAL;  Surgeon: Algernon Huxley, MD;  Location: [facility];  Service: Cardiovascular;  Laterality: N/A;   Family History  Problem Relation Age of Onset  . Hypertension Father    Social History   Tobacco Use  . Smoking status: Never Smoker  . Smokeless tobacco: Never Used  Substance Use Topics  . Alcohol use: No  . Drug use: No      Review of Systems Per HPI    Objective:   Physical Exam  Constitutional: She appears well-developed and well-nourished.  HENT:  Head: Normocephalic and  atraumatic.  Cardiovascular: Normal rate.  Pulmonary/Chest: Effort normal.  Skin: Skin is warm and dry.  Left middle finger with long nail. No erythema or drainage. No joint swelling, tenderness. Nail slightly thicker than other nails.   Psychiatric: She has a normal mood and affect. Her behavior is normal. Judgment and thought content normal.  Vitals reviewed.     BP 134/64 (BP Location: Right Arm, Patient Position: Sitting, Cuff Size: Large)   Pulse 77   Temp 98.3 F (36.8 C) (Oral)   Ht 5\' 2"  (1.575 m)   Wt 191 lb 8 oz (86.9 kg)   SpO2 97%   BMI 35.03 kg/m  Wt Readings from Last 3 Encounters:  12/28/17 191 lb 8 oz (86.9 kg)  12/28/17 193 lb (87.5 kg)  10/20/17 207 lb (93.9 kg)       Assessment & Plan:  1. Dysmorphic fingernail, acquired - suspect she had some trauma to her nail bed - no s/s infection today - RTC precautions reviewed - Can try warm water + epsom salts prn   Clarene Reamer, FNP-BC  [facility]  12/28/2017 12:23 PM

## 2018-01-18 ENCOUNTER — Ambulatory Visit (INDEPENDENT_AMBULATORY_CARE_PROVIDER_SITE_OTHER): Payer: Medicare Other | Admitting: *Deleted

## 2018-01-18 VITALS — BP 158/88 | HR 88 | Ht 62.0 in | Wt 198.2 lb

## 2018-01-18 DIAGNOSIS — I10 Essential (primary) hypertension: Secondary | ICD-10-CM | POA: Diagnosis not present

## 2018-01-18 NOTE — Progress Notes (Signed)
1.) Reason for visit: BP check   2.) Name of MD requesting visit: Dr End  3.) H&P: HTN  4.) ROS related to problem: Today patient's BP is 158/88 manual. BP 167/85, HR 82 using patient's home BP cuff.  Daughter here with patient today and manages her medications along with patient. She has only given patient losartan once since last office visit. When she sees <120 SBP she is not giving patient the carvedilol whether in the morning or evening time depending what the BP is. Patient and daughter are concerned about BP dropping at night if she takes the carvedilol in the evening and then goes on dialysis which she receives every night. They would like parameters for when she should or shouldn't take the carvedilol.  Heart rates have been running between 78-89 at home for patient. They have provided AM/PM Blood pressures as follows:  6/27 AM 144/76 PM 120/71 6/28 AM 131/78 PM 128/71 6/29 AM 133/79 PM 128/73 6/30 AM 122/73 PM 148/75 7/01 AM 143/75 PM 125/73 7/02 AM 117/73 PM 137/76 7/03 AM 136/81 PM 150/73 7/04 AM 128/70 PM 116/67 7/05 AM 138/79 PM 145/77 7/06 AM 153/78 PM 117/71 7/07 AM 152/87 PM 114/74 7/08 AM 125/78 PM 131/72 7/09 AM 127/73 PM 123/76 7/10 AM 139/82  5.) Assessment and plan per MD: Advised patient and daughter to await for recommendations from Dr End concerning BP management. Routing to Dr End for advice.

## 2018-01-19 ENCOUNTER — Telehealth: Payer: Self-pay | Admitting: *Deleted

## 2018-01-19 NOTE — Telephone Encounter (Signed)
Author: Nelva Bush, MD Service: Cardiology Author Type: Physician  Filed: 01/19/2018 7:12 AM Encounter Date: 01/18/2018 Status: Signed  Editor: Nelva Bush, MD (Physician)       Show:Clear all [x] Manual[x] Template[] Copied  Added by: [x] End, Christopher, MD   [] Hover for details   Blood pressures are somewhat labile but generally appear to be normal to mildly elevated.  Modest elevation noted in the office today.  We will tolerated degree of permissive hypertension.  I recommend continuing current medications.  Evening carvedilol can be held if blood pressure is less than 120/70 to prevent hypotension with nocturnal dialysis.  Jeanmarie Hubert, MD [facility] Pager: [phone]

## 2018-01-19 NOTE — Progress Notes (Signed)
Blood pressures are somewhat labile but generally appear to be normal to mildly elevated.  Modest elevation noted in the office today.  We will tolerated degree of permissive hypertension.  I recommend continuing current medications.  Evening carvedilol can be held if blood pressure is less than 120/70 to prevent hypotension with nocturnal dialysis.  Nelva Bush, MD [facility] Pager: [phone]

## 2018-01-19 NOTE — Telephone Encounter (Signed)
-----   Message from Nelva Bush, MD sent at 01/19/2018  7:12 AM EDT -----   ----- Message ----- From: Vanessa Ralphs, RN Sent: 01/18/2018  12:12 PM To: Nelva Bush, MD

## 2018-01-19 NOTE — Telephone Encounter (Signed)
No answer. Left message to call back.   

## 2018-01-19 NOTE — Progress Notes (Signed)
Patient notified. See telephone note.

## 2018-01-20 NOTE — Telephone Encounter (Signed)
Patient daughter returning call  °

## 2018-01-20 NOTE — Telephone Encounter (Signed)
I called and spoke with the patient's daughter and advised her of Dr. Darnelle Bos recommendations to continue current medications and hold her evening dose of coreg for a blood pressure less than 120/70. The patient's daughter voices understanding.  She also inquired about Dr. Darnelle Bos comments regarding losartan. I advised there was nothing specifically mentioned by Dr. Saunders Revel regarding losartan. Per the patient's daughter, the patient has been off of the losartan since she last saw Dr. Saunders Revel due to some low BP readings.  In reviewing Dr. Darnelle Bos last office note, he had recommended her being on losartan. She states that all of the readings that were reported at her nurse visit were taken off losartan.  I advised I would forward to Dr. Saunders Revel to review and we will be back in touch with her. She is agreeable.

## 2018-01-21 NOTE — Telephone Encounter (Signed)
If the patient has been off losartan, I do not recommend that we restart it this time due to potential for hypotension.  She should continue to monitor her BP closely and to let us know if it is consistent above 140/90.  Nelva Bush, MD [facility] Pager: [phone]

## 2018-01-23 NOTE — Telephone Encounter (Signed)
I called Robesha and advised her of Dr. Darnelle Bos recommendations to stay off losartan at this time and call us if the patient's BP readings are consistently >140/90. Robesha verbalizes understanding and is agreeable.

## 2018-02-07 ENCOUNTER — Other Ambulatory Visit: Payer: Self-pay | Admitting: Family Medicine

## 2018-02-07 ENCOUNTER — Encounter: Payer: Self-pay | Admitting: Family Medicine

## 2018-02-08 MED ORDER — COLCHICINE 0.6 MG PO TABS: 0.6000 mg | ORAL_TABLET | Freq: Every day | ORAL | 1 refills | Status: DC | PRN

## 2018-02-09 DIAGNOSIS — N186 End stage renal disease: Secondary | ICD-10-CM | POA: Diagnosis not present

## 2018-02-09 DIAGNOSIS — D509 Iron deficiency anemia, unspecified: Secondary | ICD-10-CM | POA: Diagnosis not present

## 2018-02-09 DIAGNOSIS — Z992 Dependence on renal dialysis: Secondary | ICD-10-CM | POA: Diagnosis not present

## 2018-02-10 DIAGNOSIS — N186 End stage renal disease: Secondary | ICD-10-CM | POA: Diagnosis not present

## 2018-02-10 DIAGNOSIS — Z992 Dependence on renal dialysis: Secondary | ICD-10-CM | POA: Diagnosis not present

## 2018-02-10 DIAGNOSIS — D509 Iron deficiency anemia, unspecified: Secondary | ICD-10-CM | POA: Diagnosis not present

## 2018-02-11 DIAGNOSIS — Z992 Dependence on renal dialysis: Secondary | ICD-10-CM | POA: Diagnosis not present

## 2018-02-11 DIAGNOSIS — D509 Iron deficiency anemia, unspecified: Secondary | ICD-10-CM | POA: Diagnosis not present

## 2018-02-11 DIAGNOSIS — N186 End stage renal disease: Secondary | ICD-10-CM | POA: Diagnosis not present

## 2018-02-12 DIAGNOSIS — D509 Iron deficiency anemia, unspecified: Secondary | ICD-10-CM | POA: Diagnosis not present

## 2018-02-12 DIAGNOSIS — N186 End stage renal disease: Secondary | ICD-10-CM | POA: Diagnosis not present

## 2018-02-12 DIAGNOSIS — Z992 Dependence on renal dialysis: Secondary | ICD-10-CM | POA: Diagnosis not present

## 2018-02-13 DIAGNOSIS — Z992 Dependence on renal dialysis: Secondary | ICD-10-CM | POA: Diagnosis not present

## 2018-02-13 DIAGNOSIS — N186 End stage renal disease: Secondary | ICD-10-CM | POA: Diagnosis not present

## 2018-02-13 DIAGNOSIS — D509 Iron deficiency anemia, unspecified: Secondary | ICD-10-CM | POA: Diagnosis not present

## 2018-02-14 DIAGNOSIS — N186 End stage renal disease: Secondary | ICD-10-CM | POA: Diagnosis not present

## 2018-02-14 DIAGNOSIS — Z992 Dependence on renal dialysis: Secondary | ICD-10-CM | POA: Diagnosis not present

## 2018-02-14 DIAGNOSIS — D509 Iron deficiency anemia, unspecified: Secondary | ICD-10-CM | POA: Diagnosis not present

## 2018-02-15 DIAGNOSIS — Z992 Dependence on renal dialysis: Secondary | ICD-10-CM | POA: Diagnosis not present

## 2018-02-15 DIAGNOSIS — D509 Iron deficiency anemia, unspecified: Secondary | ICD-10-CM | POA: Diagnosis not present

## 2018-02-15 DIAGNOSIS — N186 End stage renal disease: Secondary | ICD-10-CM | POA: Diagnosis not present

## 2018-02-16 DIAGNOSIS — N186 End stage renal disease: Secondary | ICD-10-CM | POA: Diagnosis not present

## 2018-02-16 DIAGNOSIS — D509 Iron deficiency anemia, unspecified: Secondary | ICD-10-CM | POA: Diagnosis not present

## 2018-02-16 DIAGNOSIS — Z992 Dependence on renal dialysis: Secondary | ICD-10-CM | POA: Diagnosis not present

## 2018-02-17 DIAGNOSIS — Z992 Dependence on renal dialysis: Secondary | ICD-10-CM | POA: Diagnosis not present

## 2018-02-17 DIAGNOSIS — D509 Iron deficiency anemia, unspecified: Secondary | ICD-10-CM | POA: Diagnosis not present

## 2018-02-17 DIAGNOSIS — N186 End stage renal disease: Secondary | ICD-10-CM | POA: Diagnosis not present

## 2018-02-18 DIAGNOSIS — Z992 Dependence on renal dialysis: Secondary | ICD-10-CM | POA: Diagnosis not present

## 2018-02-18 DIAGNOSIS — D509 Iron deficiency anemia, unspecified: Secondary | ICD-10-CM | POA: Diagnosis not present

## 2018-02-18 DIAGNOSIS — N186 End stage renal disease: Secondary | ICD-10-CM | POA: Diagnosis not present

## 2018-02-19 DIAGNOSIS — D509 Iron deficiency anemia, unspecified: Secondary | ICD-10-CM | POA: Diagnosis not present

## 2018-02-19 DIAGNOSIS — Z992 Dependence on renal dialysis: Secondary | ICD-10-CM | POA: Diagnosis not present

## 2018-02-19 DIAGNOSIS — N186 End stage renal disease: Secondary | ICD-10-CM | POA: Diagnosis not present

## 2018-02-20 DIAGNOSIS — N186 End stage renal disease: Secondary | ICD-10-CM | POA: Diagnosis not present

## 2018-02-20 DIAGNOSIS — Z992 Dependence on renal dialysis: Secondary | ICD-10-CM | POA: Diagnosis not present

## 2018-02-20 DIAGNOSIS — D509 Iron deficiency anemia, unspecified: Secondary | ICD-10-CM | POA: Diagnosis not present

## 2018-02-21 ENCOUNTER — Emergency Department
Admission: EM | Admit: 2018-02-21 | Discharge: 2018-02-21 | Disposition: A | Discharge status: Home / Self Care | Payer: Medicare Other | Attending: Emergency Medicine | Admitting: Emergency Medicine

## 2018-02-21 ENCOUNTER — Other Ambulatory Visit: Payer: Self-pay

## 2018-02-21 ENCOUNTER — Encounter: Payer: Self-pay | Admitting: Emergency Medicine

## 2018-02-21 DIAGNOSIS — I509 Heart failure, unspecified: Secondary | ICD-10-CM | POA: Diagnosis not present

## 2018-02-21 DIAGNOSIS — N186 End stage renal disease: Secondary | ICD-10-CM | POA: Diagnosis not present

## 2018-02-21 DIAGNOSIS — Z79899 Other long term (current) drug therapy: Secondary | ICD-10-CM | POA: Diagnosis not present

## 2018-02-21 DIAGNOSIS — I132 Hypertensive heart and chronic kidney disease with heart failure and with stage 5 chronic kidney disease, or end stage renal disease: Secondary | ICD-10-CM | POA: Insufficient documentation

## 2018-02-21 DIAGNOSIS — E1122 Type 2 diabetes mellitus with diabetic chronic kidney disease: Secondary | ICD-10-CM | POA: Diagnosis not present

## 2018-02-21 DIAGNOSIS — Z992 Dependence on renal dialysis: Secondary | ICD-10-CM | POA: Insufficient documentation

## 2018-02-21 DIAGNOSIS — R42 Dizziness and giddiness: Secondary | ICD-10-CM | POA: Diagnosis present

## 2018-02-21 DIAGNOSIS — I480 Paroxysmal atrial fibrillation: Secondary | ICD-10-CM | POA: Diagnosis not present

## 2018-02-21 DIAGNOSIS — D509 Iron deficiency anemia, unspecified: Secondary | ICD-10-CM | POA: Diagnosis not present

## 2018-02-21 DIAGNOSIS — Z7901 Long term (current) use of anticoagulants: Secondary | ICD-10-CM | POA: Diagnosis not present

## 2018-02-21 LAB — CBC WITH DIFFERENTIAL/PLATELET
Basophils Absolute: 0.1 10*3/uL (ref 0–0.1)
Basophils Relative: 1 %
Eosinophils Absolute: 0.1 10*3/uL (ref 0–0.7)
Eosinophils Relative: 1 %
HCT: 33.7 % — ABNORMAL LOW (ref 35.0–47.0)
Hemoglobin: 11.5 g/dL — ABNORMAL LOW (ref 12.0–16.0)
Lymphocytes Relative: 24 %
Lymphs Abs: 2.5 10*3/uL (ref 1.0–3.6)
MCH: 31.5 pg (ref 26.0–34.0)
MCHC: 34.1 g/dL (ref 32.0–36.0)
MCV: 92.4 fL (ref 80.0–100.0)
Monocytes Absolute: 0.9 10*3/uL (ref 0.2–0.9)
Monocytes Relative: 9 %
Neutro Abs: 6.6 10*3/uL — ABNORMAL HIGH (ref 1.4–6.5)
Neutrophils Relative %: 65 %
Platelets: 252 10*3/uL (ref 150–440)
RBC: 3.65 MIL/uL — ABNORMAL LOW (ref 3.80–5.20)
RDW: 17.7 % — ABNORMAL HIGH (ref 11.5–14.5)
WBC: 10.2 10*3/uL (ref 3.6–11.0)

## 2018-02-21 LAB — BASIC METABOLIC PANEL
ANION GAP: 16 — AB (ref 5–15)
BUN: 53 mg/dL — ABNORMAL HIGH (ref 8–23)
CALCIUM: 8.7 mg/dL — AB (ref 8.9–10.3)
CO2: 28 mmol/L (ref 22–32)
Chloride: 93 mmol/L — ABNORMAL LOW (ref 98–111)
Creatinine, Ser: 10.52 mg/dL — ABNORMAL HIGH (ref 0.44–1.00)
GFR calc non Af Amer: 3 mL/min — ABNORMAL LOW (ref >60)
GFR, EST AFRICAN AMERICAN: 3 mL/min — AB (ref >60)
GLUCOSE: 138 mg/dL — AB (ref 70–99)
POTASSIUM: 4 mmol/L (ref 3.5–5.1)
Sodium: 137 mmol/L (ref 135–145)

## 2018-02-21 LAB — TROPONIN I
TROPONIN I: 0.07 ng/mL — AB (ref <0.03)
Troponin I: 0.07 ng/mL (ref <0.03)

## 2018-02-21 LAB — TSH: TSH: 3.308 u[IU]/mL (ref 0.350–4.500)

## 2018-02-21 LAB — T4, FREE: Free T4: 0.73 ng/dL — ABNORMAL LOW (ref 0.82–1.77)

## 2018-02-21 LAB — MAGNESIUM: MAGNESIUM: 1.7 mg/dL (ref 1.7–2.4)

## 2018-02-21 LAB — PHOSPHORUS: PHOSPHORUS: 4 mg/dL (ref 2.5–4.6)

## 2018-02-21 MED ORDER — CARVEDILOL 6.25 MG PO TABS
6.2500 mg | ORAL_TABLET | ORAL | Status: AC
  Administered 2018-02-21: 6.25 mg via ORAL
  Filled 2018-02-21: qty 1

## 2018-02-21 NOTE — ED Notes (Signed)
Attempted to DC pt at this time. Pt daughter has further questions regarding BP and when to take medications. MD notified and RN waiting for MD to discuss matter with pt and family

## 2018-02-21 NOTE — Discharge Instructions (Addendum)
Take your Coreg as prescribed for the next several days until following up with your doctor, unless your blood pressure is too low.  This will help control high blood pressure as well as your fast heart rate from atrial fibrillation.

## 2018-02-21 NOTE — ED Provider Notes (Signed)
[facility] Emergency Department Provider Note  ____________________________________________  Time seen: Approximately 3:51 PM  I have reviewed the triage vital signs and the nursing notes.   HISTORY  Chief Complaint No chief complaint on file.    HPI Stacy Pham is a 84 y.o. female with a history of A. fib, diabetes, hypertension, end-stage renal disease on peritoneal dialysis who was sent to the ED today due to high blood pressure and fast heart rate from a outpatient nephrology appointment.  She denies any symptoms other than some mild dizziness this morning.  No syncope chest pain shortness of breath or abdominal pain.  She is been completing her peritoneal dialysis without difficulty, clear effluent, normal volumes.  She is on Eliquis as well as Coreg.  She and her daughter do relate that she has been missing her Eliquis for the past 2 days, also taking her Coreg intermittently.  By arrival to the ED she is asymptomatic and feels fine.  Blood pressure not severely elevated in the emergency department.  Denies hot or cold intolerance or sweats.  No weight changes.      Past Medical History:  Diagnosis Date  . Atrial fibrillation ([place])   . Cataract   . Diabetes mellitus without complication ([place])   . Dialysis patient ([place])    2/19- changed from hemodialysis to nightly peritoneal  . Hypertension   . Renal failure      Patient Active Problem List   Diagnosis Date Noted  . Chronic heart failure with preserved ejection fraction ([place]) 12/28/2017  . 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 ESRD (end stage renal disease) ([place]) 08/25/2017  . Paroxysmal atrial fibrillation ([place]) 05/21/2017  . Hypertension 05/21/2017  . Type 2 diabetes mellitus with chronic kidney disease on chronic dialysis, without long-term current use of insulin ([place]) 05/21/2017  . Stage 5 chronic kidney disease on chronic dialysis ([place]) 05/21/2017  . Acute  hyperkalemia 04/27/2017     Past Surgical History:  Procedure Laterality Date  . CAPD INSERTION N/A 06/08/2017   Procedure: LAPAROSCOPIC INSERTION CONTINUOUS AMBULATORY PERITONEAL DIALYSIS  (CAPD) CATHETER;  Surgeon: Katha Cabal, MD;  Location: [facility] ORS;  Service: Vascular;  Laterality: N/A;  . CATARACT EXTRACTION, BILATERAL Bilateral   . DIALYSIS/PERMA CATHETER INSERTION N/A 03/18/2017   Procedure: DIALYSIS/PERMA CATHETER INSERTION;  Surgeon: Katha Cabal, MD;  Location: [place] [facility];  Service: Cardiovascular;  Laterality: N/A;  . DIALYSIS/PERMA CATHETER REMOVAL N/A 10/20/2017   Procedure: DIALYSIS/PERMA CATHETER REMOVAL;  Surgeon: Algernon Huxley, MD;  Location: [place] [facility];  Service: Cardiovascular;  Laterality: N/A;     Prior to Admission medications   Medication Sig Start Date End Date Taking? Authorizing Provider  acetaminophen (TYLENOL 8 HOUR ARTHRITIS PAIN) 650 MG CR tablet Take 650 mg every 8 (eight) hours as needed by mouth for pain.    [provider]  allopurinol (ZYLOPRIM) 100 MG tablet Take 1 tablet (100 mg total) by mouth daily. 10/12/17   Elby Beck, FNP  apixaban (ELIQUIS) 2.5 MG TABS tablet Take 1 tablet (2.5 mg total) by mouth 2 (two) times daily. 07/04/17   Elby Beck, FNP  atorvastatin (LIPITOR) 20 MG tablet Take 1 tablet (20 mg total) by mouth every evening. 09/07/17   Elby Beck, FNP  blood glucose meter kit and supplies KIT Please dispense either One Touch or Bayer Contour She must have one of these machines since she is on peritoneal dialysis. Use up  to four times daily as directed. (FOR ICD-9 250.00, 250.01). 09/23/17   Elby Beck, FNP  carvedilol (COREG) 6.25 MG tablet Take 1 tablet (6.25 mg total) by mouth 2 (two) times daily. 12/28/17   End, Harrell Gave, MD  cinacalcet (SENSIPAR) 30 MG tablet Take 1 tablet (30 mg total) by mouth daily with supper. Patient taking differently: Take 30 mg by mouth daily.  With largest meal 04/28/17   [name], MD  colchicine 0.6 MG tablet TAKE 1 TABLET BY MOUTH ONCE DAILY AS NEEDED FOR  GOUT  FLARE  UPS 02/13/18   Elby Beck, FNP  colchicine 0.6 MG tablet Take 1 tablet (0.6 mg total) by mouth daily as needed (for gout flare ups). 02/08/18   Elby Beck, FNP  ferric citrate (AURYXIA) 1 GM 210 MG(Fe) tablet Take 210 mg by mouth 3 (three) times daily with meals.    [provider]  fluticasone (FLONASE) 50 MCG/ACT nasal spray Place 1 spray into both nostrils daily as needed for allergies or rhinitis. 09/07/17   Elby Beck, FNP  gabapentin (NEURONTIN) 100 MG capsule Take 1 capsule (100 mg total) by mouth 3 (three) times daily. 10/12/17   Elby Beck, FNP  glucose blood (ONETOUCH VERIO) test strip Test blood sugar four times daily. Dx 250.00, 250.01, E11.22, N18.6 09/23/17   Elby Beck, FNP  hydroxypropyl methylcellulose / hypromellose (ISOPTO TEARS / GONIOVISC) 2.5 % ophthalmic solution Place 1-2 drops 3 (three) times daily as needed into both eyes for dry eyes.    [provider]  [facility] DELICA LANCETS 37J MISC Test blood sugar four times daily. Dx 250.00, 250.01, E11.22, N18.6 09/23/17   Elby Beck, FNP     Allergies Patient has no known allergies.   Family History  Problem Relation Age of Onset  . Hypertension Father     Social History Social History   Tobacco Use  . Smoking status: Never Smoker  . Smokeless tobacco: Never Used  Substance Use Topics  . Alcohol use: No  . Drug use: No    Review of Systems  Constitutional:   No fever or chills.  Cardiovascular:   No chest pain or syncope. Respiratory:   No dyspnea or cough. Gastrointestinal:   Negative for abdominal pain, vomiting and diarrhea.  Musculoskeletal:   Negative for focal pain or swelling All other systems reviewed and are negative except as documented above in ROS and  HPI.  ____________________________________________   PHYSICAL EXAM:  VITAL SIGNS: ED Triage Vitals  Enc Vitals Group     BP 02/21/18 1138 (!) 148/95     Pulse Rate 02/21/18 1138 (!) 156     Resp 02/21/18 1138 18     Temp 02/21/18 1138 98.4 F (36.9 C)     Temp Source 02/21/18 1138 Oral     SpO2 02/21/18 1138 97 %     Weight 02/21/18 1139 198 lb 4.8 oz (89.9 kg)     Height 02/21/18 1139 '5\' 2"'  (1.575 m)     Head Circumference --      Peak Flow --      Pain Score 02/21/18 1139 0     Pain Loc --      Pain Edu? --      Excl. in [place]? --     Vital signs reviewed, nursing assessments reviewed.   Constitutional:   Alert and oriented. Non-toxic appearance. Eyes:   Conjunctivae are normal. EOMI. PERRL. ENT  Head:   Normocephalic and atraumatic.      Nose:   No congestion/rhinnorhea.       Mouth/Throat:   MMM, no pharyngeal erythema. No peritonsillar mass.       Neck:   No meningismus. Full ROM.  Thyroid nonpalpable Hematological/Lymphatic/Immunilogical:   No cervical lymphadenopathy. Cardiovascular:   RRR, heart rate 95. Symmetric bilateral radial and DP pulses.  No murmurs. Cap refill less than 2 seconds. Respiratory:   Normal respiratory effort without tachypnea/retractions. Breath sounds are clear and equal bilaterally. No wheezes/rales/rhonchi. Gastrointestinal:   Soft and nontender. Non distended. There is no CVA tenderness.  No rebound, rigidity, or guarding.  PD catheter site clean Musculoskeletal:   Normal range of motion in all extremities. No joint effusions.  No lower extremity tenderness.  No edema. Neurologic:   Normal speech and language.  Motor grossly intact. No acute focal neurologic deficits are appreciated.  Skin:    Skin is warm, dry and intact. No rash noted.  No petechiae, purpura, or bullae.  ____________________________________________    LABS (pertinent positives/negatives) (all labs ordered are listed, but only abnormal results are  displayed) Labs Reviewed  BASIC METABOLIC PANEL - Abnormal; Notable for the following components:      Result Value   Chloride 93 (*)    Glucose, Bld 138 (*)    BUN 53 (*)    Creatinine, Ser 10.52 (*)    Calcium 8.7 (*)    GFR calc non Af Amer 3 (*)    GFR calc Af Amer 3 (*)    Anion gap 16 (*)    All other components within normal limits  CBC WITH DIFFERENTIAL/PLATELET - Abnormal; Notable for the following components:   RBC 3.65 (*)    Hemoglobin 11.5 (*)    HCT 33.7 (*)    RDW 17.7 (*)    Neutro Abs 6.6 (*)    All other components within normal limits  TROPONIN I - Abnormal; Notable for the following components:   Troponin I 0.07 (*)    All other components within normal limits  T4, FREE - Abnormal; Notable for the following components:   Free T4 0.73 (*)    All other components within normal limits  TROPONIN I - Abnormal; Notable for the following components:   Troponin I 0.07 (*)    All other components within normal limits  MAGNESIUM  PHOSPHORUS  TSH   ____________________________________________   EKG  Interpreted by me Atrial fibrillation rate 161, left axis, normal intervals.  Normal QRS.  Slight ST depression in the inferior leads consistent with rate related demand ischemia.  No ST elevation, normal T waves.  ____________________________________________    RADIOLOGY  No results found.  ____________________________________________   PROCEDURES Procedures  ____________________________________________  DIFFERENTIAL DIAGNOSIS   Paroxysmal atrial fibrillation, likely disturbance, dehydration  CLINICAL IMPRESSION / ASSESSMENT AND PLAN / ED COURSE  Pertinent labs & imaging results that were available during my care of the patient were reviewed by me and considered in my medical decision making (see chart for details).    Patient is not in distress and nontoxic.  Vital signs are unremarkable except for tachycardia on initial triage assessment.  On  arrival to the ED treatment room, heart rate has improved to 100 and she is in a sinus rhythm on the monitor.  She is on Eliquis for A. fib stroke prevention.  She is on Coreg 2 times a day but admits to noncompliance.  Labs were checked due to  her age and comorbidities, initial troponins 0.07, without any baseline to compare to.  Other labs unremarkable.  She has been completing peritoneal dialysis without difficulty, there is no evidence of SBP or other dialysis complication at this time.   Clinical Course as of Feb 21 1550  Tue Feb 21, 2018  1549 Repeat troponin unchanged.  Suitable for discharge home.  Troponin I(!!): 0.07 [PS]    Clinical Course User Index [PS] Carrie Mew, MD     ____________________________________________   FINAL CLINICAL IMPRESSION(S) / ED DIAGNOSES    Final diagnoses:  Paroxysmal atrial fibrillation [facility])     ED Discharge Orders    None      Portions of this note were generated with dragon dictation software. Dictation errors may occur despite best attempts at proofreading.    Carrie Mew, MD 02/21/18 [phone]

## 2018-02-21 NOTE — ED Triage Notes (Signed)
Pt sent from [place] with c/o high blood pressure and pulse. Denies any pain at this time. Pt states she had some dizziness this am, however, none now. HR 150's.

## 2018-02-21 NOTE — ED Notes (Signed)
Pt to rm 6 and placed on cardiac monitor. MD Joni Fears and RN Lorrie aware that pt is in the rm.

## 2018-02-21 NOTE — ED Notes (Signed)
Date and time results received: 02/21/18  Test: Troponin Critical Value:0.07  Name of Provider Notified: Joni Fears  Orders Received? Or Actions Taken?: MD notified

## 2018-02-22 ENCOUNTER — Other Ambulatory Visit: Payer: Self-pay | Admitting: Family Medicine

## 2018-02-22 DIAGNOSIS — N186 End stage renal disease: Secondary | ICD-10-CM | POA: Diagnosis not present

## 2018-02-22 DIAGNOSIS — Z992 Dependence on renal dialysis: Secondary | ICD-10-CM | POA: Diagnosis not present

## 2018-02-22 DIAGNOSIS — D509 Iron deficiency anemia, unspecified: Secondary | ICD-10-CM | POA: Diagnosis not present

## 2018-02-23 DIAGNOSIS — Z992 Dependence on renal dialysis: Secondary | ICD-10-CM | POA: Diagnosis not present

## 2018-02-23 DIAGNOSIS — D509 Iron deficiency anemia, unspecified: Secondary | ICD-10-CM | POA: Diagnosis not present

## 2018-02-23 DIAGNOSIS — N186 End stage renal disease: Secondary | ICD-10-CM | POA: Diagnosis not present

## 2018-02-24 DIAGNOSIS — D509 Iron deficiency anemia, unspecified: Secondary | ICD-10-CM | POA: Diagnosis not present

## 2018-02-24 DIAGNOSIS — Z992 Dependence on renal dialysis: Secondary | ICD-10-CM | POA: Diagnosis not present

## 2018-02-24 DIAGNOSIS — N186 End stage renal disease: Secondary | ICD-10-CM | POA: Diagnosis not present

## 2018-02-25 DIAGNOSIS — D509 Iron deficiency anemia, unspecified: Secondary | ICD-10-CM | POA: Diagnosis not present

## 2018-02-25 DIAGNOSIS — N186 End stage renal disease: Secondary | ICD-10-CM | POA: Diagnosis not present

## 2018-02-25 DIAGNOSIS — Z992 Dependence on renal dialysis: Secondary | ICD-10-CM | POA: Diagnosis not present

## 2018-02-26 DIAGNOSIS — D509 Iron deficiency anemia, unspecified: Secondary | ICD-10-CM | POA: Diagnosis not present

## 2018-02-26 DIAGNOSIS — N186 End stage renal disease: Secondary | ICD-10-CM | POA: Diagnosis not present

## 2018-02-26 DIAGNOSIS — Z992 Dependence on renal dialysis: Secondary | ICD-10-CM | POA: Diagnosis not present

## 2018-02-27 DIAGNOSIS — Z992 Dependence on renal dialysis: Secondary | ICD-10-CM | POA: Diagnosis not present

## 2018-02-27 DIAGNOSIS — N186 End stage renal disease: Secondary | ICD-10-CM | POA: Diagnosis not present

## 2018-02-27 DIAGNOSIS — D509 Iron deficiency anemia, unspecified: Secondary | ICD-10-CM | POA: Diagnosis not present

## 2018-02-28 DIAGNOSIS — N186 End stage renal disease: Secondary | ICD-10-CM | POA: Diagnosis not present

## 2018-02-28 DIAGNOSIS — D509 Iron deficiency anemia, unspecified: Secondary | ICD-10-CM | POA: Diagnosis not present

## 2018-02-28 DIAGNOSIS — Z992 Dependence on renal dialysis: Secondary | ICD-10-CM | POA: Diagnosis not present

## 2018-03-01 DIAGNOSIS — N186 End stage renal disease: Secondary | ICD-10-CM | POA: Diagnosis not present

## 2018-03-01 DIAGNOSIS — Z992 Dependence on renal dialysis: Secondary | ICD-10-CM | POA: Diagnosis not present

## 2018-03-01 DIAGNOSIS — D509 Iron deficiency anemia, unspecified: Secondary | ICD-10-CM | POA: Diagnosis not present

## 2018-03-02 DIAGNOSIS — D509 Iron deficiency anemia, unspecified: Secondary | ICD-10-CM | POA: Diagnosis not present

## 2018-03-02 DIAGNOSIS — Z992 Dependence on renal dialysis: Secondary | ICD-10-CM | POA: Diagnosis not present

## 2018-03-02 DIAGNOSIS — N186 End stage renal disease: Secondary | ICD-10-CM | POA: Diagnosis not present

## 2018-03-03 DIAGNOSIS — Z992 Dependence on renal dialysis: Secondary | ICD-10-CM | POA: Diagnosis not present

## 2018-03-03 DIAGNOSIS — N186 End stage renal disease: Secondary | ICD-10-CM | POA: Diagnosis not present

## 2018-03-03 DIAGNOSIS — D509 Iron deficiency anemia, unspecified: Secondary | ICD-10-CM | POA: Diagnosis not present

## 2018-03-04 DIAGNOSIS — D509 Iron deficiency anemia, unspecified: Secondary | ICD-10-CM | POA: Diagnosis not present

## 2018-03-04 DIAGNOSIS — Z992 Dependence on renal dialysis: Secondary | ICD-10-CM | POA: Diagnosis not present

## 2018-03-04 DIAGNOSIS — N186 End stage renal disease: Secondary | ICD-10-CM | POA: Diagnosis not present

## 2018-03-05 DIAGNOSIS — D509 Iron deficiency anemia, unspecified: Secondary | ICD-10-CM | POA: Diagnosis not present

## 2018-03-05 DIAGNOSIS — N186 End stage renal disease: Secondary | ICD-10-CM | POA: Diagnosis not present

## 2018-03-05 DIAGNOSIS — Z992 Dependence on renal dialysis: Secondary | ICD-10-CM | POA: Diagnosis not present

## 2018-03-06 DIAGNOSIS — Z992 Dependence on renal dialysis: Secondary | ICD-10-CM | POA: Diagnosis not present

## 2018-03-06 DIAGNOSIS — D509 Iron deficiency anemia, unspecified: Secondary | ICD-10-CM | POA: Diagnosis not present

## 2018-03-06 DIAGNOSIS — N186 End stage renal disease: Secondary | ICD-10-CM | POA: Diagnosis not present

## 2018-03-07 DIAGNOSIS — Z992 Dependence on renal dialysis: Secondary | ICD-10-CM | POA: Diagnosis not present

## 2018-03-07 DIAGNOSIS — N186 End stage renal disease: Secondary | ICD-10-CM | POA: Diagnosis not present

## 2018-03-07 DIAGNOSIS — D509 Iron deficiency anemia, unspecified: Secondary | ICD-10-CM | POA: Diagnosis not present

## 2018-03-08 DIAGNOSIS — N186 End stage renal disease: Secondary | ICD-10-CM | POA: Diagnosis not present

## 2018-03-08 DIAGNOSIS — D509 Iron deficiency anemia, unspecified: Secondary | ICD-10-CM | POA: Diagnosis not present

## 2018-03-08 DIAGNOSIS — Z992 Dependence on renal dialysis: Secondary | ICD-10-CM | POA: Diagnosis not present

## 2018-03-09 DIAGNOSIS — Z992 Dependence on renal dialysis: Secondary | ICD-10-CM | POA: Diagnosis not present

## 2018-03-09 DIAGNOSIS — D509 Iron deficiency anemia, unspecified: Secondary | ICD-10-CM | POA: Diagnosis not present

## 2018-03-09 DIAGNOSIS — N186 End stage renal disease: Secondary | ICD-10-CM | POA: Diagnosis not present

## 2018-03-10 DIAGNOSIS — N186 End stage renal disease: Secondary | ICD-10-CM | POA: Diagnosis not present

## 2018-03-10 DIAGNOSIS — D509 Iron deficiency anemia, unspecified: Secondary | ICD-10-CM | POA: Diagnosis not present

## 2018-03-10 DIAGNOSIS — Z992 Dependence on renal dialysis: Secondary | ICD-10-CM | POA: Diagnosis not present

## 2018-03-11 DIAGNOSIS — D509 Iron deficiency anemia, unspecified: Secondary | ICD-10-CM | POA: Diagnosis not present

## 2018-03-11 DIAGNOSIS — Z992 Dependence on renal dialysis: Secondary | ICD-10-CM | POA: Diagnosis not present

## 2018-03-11 DIAGNOSIS — N186 End stage renal disease: Secondary | ICD-10-CM | POA: Diagnosis not present

## 2018-03-12 DIAGNOSIS — Z992 Dependence on renal dialysis: Secondary | ICD-10-CM | POA: Diagnosis not present

## 2018-03-12 DIAGNOSIS — D509 Iron deficiency anemia, unspecified: Secondary | ICD-10-CM | POA: Diagnosis not present

## 2018-03-12 DIAGNOSIS — N186 End stage renal disease: Secondary | ICD-10-CM | POA: Diagnosis not present

## 2018-03-12 DIAGNOSIS — D631 Anemia in chronic kidney disease: Secondary | ICD-10-CM | POA: Diagnosis not present

## 2018-03-13 DIAGNOSIS — D509 Iron deficiency anemia, unspecified: Secondary | ICD-10-CM | POA: Diagnosis not present

## 2018-03-13 DIAGNOSIS — Z992 Dependence on renal dialysis: Secondary | ICD-10-CM | POA: Diagnosis not present

## 2018-03-13 DIAGNOSIS — D631 Anemia in chronic kidney disease: Secondary | ICD-10-CM | POA: Diagnosis not present

## 2018-03-13 DIAGNOSIS — N186 End stage renal disease: Secondary | ICD-10-CM | POA: Diagnosis not present

## 2018-03-14 DIAGNOSIS — D509 Iron deficiency anemia, unspecified: Secondary | ICD-10-CM | POA: Diagnosis not present

## 2018-03-14 DIAGNOSIS — D631 Anemia in chronic kidney disease: Secondary | ICD-10-CM | POA: Diagnosis not present

## 2018-03-14 DIAGNOSIS — N186 End stage renal disease: Secondary | ICD-10-CM | POA: Diagnosis not present

## 2018-03-14 DIAGNOSIS — Z992 Dependence on renal dialysis: Secondary | ICD-10-CM | POA: Diagnosis not present

## 2018-03-15 DIAGNOSIS — D631 Anemia in chronic kidney disease: Secondary | ICD-10-CM | POA: Diagnosis not present

## 2018-03-15 DIAGNOSIS — D509 Iron deficiency anemia, unspecified: Secondary | ICD-10-CM | POA: Diagnosis not present

## 2018-03-15 DIAGNOSIS — N186 End stage renal disease: Secondary | ICD-10-CM | POA: Diagnosis not present

## 2018-03-15 DIAGNOSIS — Z992 Dependence on renal dialysis: Secondary | ICD-10-CM | POA: Diagnosis not present

## 2018-03-16 DIAGNOSIS — D509 Iron deficiency anemia, unspecified: Secondary | ICD-10-CM | POA: Diagnosis not present

## 2018-03-16 DIAGNOSIS — Z992 Dependence on renal dialysis: Secondary | ICD-10-CM | POA: Diagnosis not present

## 2018-03-16 DIAGNOSIS — N186 End stage renal disease: Secondary | ICD-10-CM | POA: Diagnosis not present

## 2018-03-16 DIAGNOSIS — D631 Anemia in chronic kidney disease: Secondary | ICD-10-CM | POA: Diagnosis not present

## 2018-03-17 ENCOUNTER — Ambulatory Visit (INDEPENDENT_AMBULATORY_CARE_PROVIDER_SITE_OTHER): Payer: Medicare Other | Admitting: Family Medicine

## 2018-03-17 ENCOUNTER — Encounter: Payer: Self-pay | Admitting: Family Medicine

## 2018-03-17 ENCOUNTER — Telehealth: Payer: Self-pay | Admitting: Family Medicine

## 2018-03-17 VITALS — BP 140/72 | HR 96 | Temp 98.3°F | Ht 62.0 in | Wt 201.0 lb

## 2018-03-17 DIAGNOSIS — L304 Erythema intertrigo: Secondary | ICD-10-CM

## 2018-03-17 DIAGNOSIS — Z992 Dependence on renal dialysis: Secondary | ICD-10-CM | POA: Diagnosis not present

## 2018-03-17 DIAGNOSIS — L309 Dermatitis, unspecified: Secondary | ICD-10-CM

## 2018-03-17 DIAGNOSIS — R252 Cramp and spasm: Secondary | ICD-10-CM

## 2018-03-17 DIAGNOSIS — D631 Anemia in chronic kidney disease: Secondary | ICD-10-CM | POA: Diagnosis not present

## 2018-03-17 DIAGNOSIS — R2689 Other abnormalities of gait and mobility: Secondary | ICD-10-CM

## 2018-03-17 DIAGNOSIS — E1122 Type 2 diabetes mellitus with diabetic chronic kidney disease: Secondary | ICD-10-CM

## 2018-03-17 DIAGNOSIS — N186 End stage renal disease: Secondary | ICD-10-CM

## 2018-03-17 DIAGNOSIS — D509 Iron deficiency anemia, unspecified: Secondary | ICD-10-CM | POA: Diagnosis not present

## 2018-03-17 LAB — MAGNESIUM: Magnesium: 1.6 mg/dL (ref 1.5–2.5)

## 2018-03-17 LAB — BASIC METABOLIC PANEL
BUN: 51 mg/dL — ABNORMAL HIGH (ref 6–23)
CHLORIDE: 92 meq/L — AB (ref 96–112)
CO2: 30 meq/L (ref 19–32)
Calcium: 8.9 mg/dL (ref 8.4–10.5)
Creatinine, Ser: 10.23 mg/dL (ref 0.40–1.20)
GFR: 4.63 mL/min — CL (ref >60.00)
Glucose, Bld: 111 mg/dL — ABNORMAL HIGH (ref 70–99)
Potassium: 4.4 mEq/L (ref 3.5–5.1)
SODIUM: 135 meq/L (ref 135–145)

## 2018-03-17 LAB — HEMOGLOBIN A1C: Hgb A1c MFr Bld: 7 % — ABNORMAL HIGH (ref 4.6–6.5)

## 2018-03-17 MED ORDER — NYSTATIN 100000 UNIT/GM EX CREA: 1.0000 "application " | TOPICAL_CREAM | Freq: Two times a day (BID) | CUTANEOUS | 1 refills | Status: DC

## 2018-03-17 MED ORDER — TRIAMCINOLONE ACETONIDE 0.025 % EX OINT: 1.0000 "application " | TOPICAL_OINTMENT | Freq: Two times a day (BID) | CUTANEOUS | 0 refills | Status: DC

## 2018-03-17 NOTE — Telephone Encounter (Signed)
Noted. Patient with ESRD on PD.

## 2018-03-17 NOTE — Telephone Encounter (Signed)
[facility] lab called with critical results.  Creatinine 10.23 GFR 4.63 Results given to Tor Netters at 3:20pm.

## 2018-03-17 NOTE — Patient Instructions (Addendum)
Good to see you today, I will notify you of lab results and whether or not there is any explanation for your foot pain- we will go from there

## 2018-03-17 NOTE — Progress Notes (Signed)
Subjective:    Patient ID: Stacy Pham, female    DOB: Dec 23, 1930, 84 y.o.   MRN: [id number]  HPI This is a 84 yo female, who is accompanied by her daughter. She presents today with rash under breasts and groin. Has applied some A&D. It has been intermittently itching. Some improvement with A&D. Itchy rash behind left ear. Has had intermittently in past, used a prescription cream (?) with improvement.   Mouth is dry at night. Watches fluid intake, limits to 32 oz daily due to CKD with nightly PD. Has appointment with nephrology next week.   Groin folds with itching. Occasionally feels like she has to urinate (anuric) and has some vaginal itching. Wears depends sometimes in case of fecal incontinence. No vaginal discharge.   Requests prescription for a cane today to assist with ambulation, prevent falls.   Otherwise doing well. She declines vaccination for flu today.   Past Medical History:  Diagnosis Date  . Atrial fibrillation ([place])   . Cataract   . Diabetes mellitus without complication ([place])   . Dialysis patient ([place])    2/19- changed from hemodialysis to nightly peritoneal  . Hypertension   . Renal failure    Past Surgical History:  Procedure Laterality Date  . CAPD INSERTION N/A 06/08/2017   Procedure: LAPAROSCOPIC INSERTION CONTINUOUS AMBULATORY PERITONEAL DIALYSIS  (CAPD) CATHETER;  Surgeon: Katha Cabal, MD;  Location: [facility] ORS;  Service: Vascular;  Laterality: N/A;  . CATARACT EXTRACTION, BILATERAL Bilateral   . DIALYSIS/PERMA CATHETER INSERTION N/A 03/18/2017   Procedure: DIALYSIS/PERMA CATHETER INSERTION;  Surgeon: Katha Cabal, MD;  Location: [place] [facility];  Service: Cardiovascular;  Laterality: N/A;  . DIALYSIS/PERMA CATHETER REMOVAL N/A 10/20/2017   Procedure: DIALYSIS/PERMA CATHETER REMOVAL;  Surgeon: Algernon Huxley, MD;  Location: [place] [facility];  Service: Cardiovascular;  Laterality: N/A;   Family History  Problem Relation Age of Onset  .  Hypertension Father    Social History   Tobacco Use  . Smoking status: Never Smoker  . Smokeless tobacco: Never Used  Substance Use Topics  . Alcohol use: No  . Drug use: No      Review of Systems Per HPI    Objective:   Physical Exam  Constitutional: She is oriented to person, place, and time. She appears well-developed and well-nourished. No distress.  HENT:  Head: Normocephalic and atraumatic.  Cardiovascular: Normal rate.  Pulmonary/Chest: Effort normal.  Musculoskeletal: She exhibits no edema.  Able to ambulate without assistance, able to get on exam table with assistance.   Neurological: She is alert and oriented to person, place, and time.  Skin: Skin is warm and dry. She is not diaphoretic.  Area behind left ear with scaling, no erythema or drainage.  Area under breasts and in groin with mild confluent erythema.  No vaginal irritation or discharge noted.   Psychiatric: She has a normal mood and affect. Her behavior is normal. Judgment and thought content normal.  Vitals reviewed.     BP 140/72 (BP Location: Left Arm, Patient Position: Sitting, Cuff Size: Normal)   Pulse 96   Temp 98.3 F (36.8 C) (Oral)   Ht 5\' 2"  (1.575 m)   Wt 201 lb (91.2 kg)   SpO2 97%   BMI 36.76 kg/m  Wt Readings from Last 3 Encounters:  03/17/18 201 lb (91.2 kg)  02/21/18 198 lb 4.8 oz (89.9 kg)  01/18/18 198 lb 4 oz (89.9 kg)       Assessment &  Plan:  1. Intertrigo - nystatin cream (MYCOSTATIN); Apply 1 application topically 2 (two) times daily. For up to 10 days.  Dispense: 30 g; Refill: 1  2. Eczema, unspecified type - triamcinolone (KENALOG) 0.025 % ointment; Apply 1 application topically 2 (two) times daily. For up to 10 days  Dispense: 30 g; Refill: 0  3. Cramping of feet - Basic Metabolic Panel - Magnesium  4. Type 2 diabetes mellitus with chronic kidney disease on chronic dialysis, without long-term current use of insulin (H[facility]) - Basic Metabolic Panel -  Hemoglobin A1c  5. Poor balance - printed prescription provided for quad cane  - Follow up in 6 months   Clarene Reamer, FNP-BC  [facility], [place]  03/17/2018 11:04 AM

## 2018-03-18 DIAGNOSIS — D509 Iron deficiency anemia, unspecified: Secondary | ICD-10-CM | POA: Diagnosis not present

## 2018-03-18 DIAGNOSIS — D631 Anemia in chronic kidney disease: Secondary | ICD-10-CM | POA: Diagnosis not present

## 2018-03-18 DIAGNOSIS — N186 End stage renal disease: Secondary | ICD-10-CM | POA: Diagnosis not present

## 2018-03-18 DIAGNOSIS — Z992 Dependence on renal dialysis: Secondary | ICD-10-CM | POA: Diagnosis not present

## 2018-03-19 DIAGNOSIS — D631 Anemia in chronic kidney disease: Secondary | ICD-10-CM | POA: Diagnosis not present

## 2018-03-19 DIAGNOSIS — D509 Iron deficiency anemia, unspecified: Secondary | ICD-10-CM | POA: Diagnosis not present

## 2018-03-19 DIAGNOSIS — N186 End stage renal disease: Secondary | ICD-10-CM | POA: Diagnosis not present

## 2018-03-19 DIAGNOSIS — Z992 Dependence on renal dialysis: Secondary | ICD-10-CM | POA: Diagnosis not present

## 2018-03-20 DIAGNOSIS — N186 End stage renal disease: Secondary | ICD-10-CM | POA: Diagnosis not present

## 2018-03-20 DIAGNOSIS — Z992 Dependence on renal dialysis: Secondary | ICD-10-CM | POA: Diagnosis not present

## 2018-03-20 DIAGNOSIS — D631 Anemia in chronic kidney disease: Secondary | ICD-10-CM | POA: Diagnosis not present

## 2018-03-20 DIAGNOSIS — D509 Iron deficiency anemia, unspecified: Secondary | ICD-10-CM | POA: Diagnosis not present

## 2018-03-21 ENCOUNTER — Telehealth: Payer: Self-pay | Admitting: Family Medicine

## 2018-03-21 ENCOUNTER — Encounter: Payer: Self-pay | Admitting: Family Medicine

## 2018-03-21 DIAGNOSIS — D509 Iron deficiency anemia, unspecified: Secondary | ICD-10-CM | POA: Diagnosis not present

## 2018-03-21 DIAGNOSIS — N186 End stage renal disease: Secondary | ICD-10-CM | POA: Diagnosis not present

## 2018-03-21 DIAGNOSIS — D631 Anemia in chronic kidney disease: Secondary | ICD-10-CM | POA: Diagnosis not present

## 2018-03-21 DIAGNOSIS — Z992 Dependence on renal dialysis: Secondary | ICD-10-CM | POA: Diagnosis not present

## 2018-03-21 NOTE — Telephone Encounter (Signed)
Copied from [place] [id number]. Topic: Quick Communication - Rx Refill/Question >> Mar 21, 2018 12:17 PM Sheran Luz wrote: Medication: apixaban (ELIQUIS) 2.5 MG TABS tablet [[id number]] , nystatin cream (MYCOSTATIN) [[id number]] and triamcinolone (KENALOG) 0.025 % ointment [[id number]]   Pt called stating that she has lost her insurance coverage and can no longer afford these medications. Pt would like to know if there are any options for her as in discounts, samples or cheaper prescriptions. Please advise.   Preferred Pharmacy (with phone number or street name): [facility] [address], [place] [phone] (Phone) [phone] (Fax)

## 2018-03-22 DIAGNOSIS — Z992 Dependence on renal dialysis: Secondary | ICD-10-CM | POA: Diagnosis not present

## 2018-03-22 DIAGNOSIS — D631 Anemia in chronic kidney disease: Secondary | ICD-10-CM | POA: Diagnosis not present

## 2018-03-22 DIAGNOSIS — N186 End stage renal disease: Secondary | ICD-10-CM | POA: Diagnosis not present

## 2018-03-22 DIAGNOSIS — D509 Iron deficiency anemia, unspecified: Secondary | ICD-10-CM | POA: Diagnosis not present

## 2018-03-23 DIAGNOSIS — Z992 Dependence on renal dialysis: Secondary | ICD-10-CM | POA: Diagnosis not present

## 2018-03-23 DIAGNOSIS — D509 Iron deficiency anemia, unspecified: Secondary | ICD-10-CM | POA: Diagnosis not present

## 2018-03-23 DIAGNOSIS — N186 End stage renal disease: Secondary | ICD-10-CM | POA: Diagnosis not present

## 2018-03-23 DIAGNOSIS — D631 Anemia in chronic kidney disease: Secondary | ICD-10-CM | POA: Diagnosis not present

## 2018-03-24 ENCOUNTER — Telehealth: Payer: Self-pay | Admitting: Internal Medicine

## 2018-03-24 DIAGNOSIS — N186 End stage renal disease: Secondary | ICD-10-CM | POA: Diagnosis not present

## 2018-03-24 DIAGNOSIS — Z992 Dependence on renal dialysis: Secondary | ICD-10-CM | POA: Diagnosis not present

## 2018-03-24 DIAGNOSIS — D509 Iron deficiency anemia, unspecified: Secondary | ICD-10-CM | POA: Diagnosis not present

## 2018-03-24 DIAGNOSIS — D631 Anemia in chronic kidney disease: Secondary | ICD-10-CM | POA: Diagnosis not present

## 2018-03-24 MED ORDER — APIXABAN 2.5 MG PO TABS: 2.5000 mg | ORAL_TABLET | Freq: Two times a day (BID) | ORAL | 0 refills | Status: DC

## 2018-03-24 NOTE — Telephone Encounter (Signed)
The patient's family sent a MyChart message that she is in need of some eliquis samples.  She is having an issue with her insurance at the moment.  Message sent back that Eliquis samples have been pulled for her and placed at the front desk for pick up.  Eliquis 2.5 mg Lot: [id number] Exp: 7/20 # 3 boxes given

## 2018-03-25 DIAGNOSIS — D509 Iron deficiency anemia, unspecified: Secondary | ICD-10-CM | POA: Diagnosis not present

## 2018-03-25 DIAGNOSIS — Z992 Dependence on renal dialysis: Secondary | ICD-10-CM | POA: Diagnosis not present

## 2018-03-25 DIAGNOSIS — N186 End stage renal disease: Secondary | ICD-10-CM | POA: Diagnosis not present

## 2018-03-25 DIAGNOSIS — D631 Anemia in chronic kidney disease: Secondary | ICD-10-CM | POA: Diagnosis not present

## 2018-03-26 DIAGNOSIS — Z992 Dependence on renal dialysis: Secondary | ICD-10-CM | POA: Diagnosis not present

## 2018-03-26 DIAGNOSIS — D509 Iron deficiency anemia, unspecified: Secondary | ICD-10-CM | POA: Diagnosis not present

## 2018-03-26 DIAGNOSIS — D631 Anemia in chronic kidney disease: Secondary | ICD-10-CM | POA: Diagnosis not present

## 2018-03-26 DIAGNOSIS — N186 End stage renal disease: Secondary | ICD-10-CM | POA: Diagnosis not present

## 2018-03-27 DIAGNOSIS — D509 Iron deficiency anemia, unspecified: Secondary | ICD-10-CM | POA: Diagnosis not present

## 2018-03-27 DIAGNOSIS — N186 End stage renal disease: Secondary | ICD-10-CM | POA: Diagnosis not present

## 2018-03-27 DIAGNOSIS — Z992 Dependence on renal dialysis: Secondary | ICD-10-CM | POA: Diagnosis not present

## 2018-03-27 DIAGNOSIS — D631 Anemia in chronic kidney disease: Secondary | ICD-10-CM | POA: Diagnosis not present

## 2018-03-28 DIAGNOSIS — D509 Iron deficiency anemia, unspecified: Secondary | ICD-10-CM | POA: Diagnosis not present

## 2018-03-28 DIAGNOSIS — Z992 Dependence on renal dialysis: Secondary | ICD-10-CM | POA: Diagnosis not present

## 2018-03-28 DIAGNOSIS — D631 Anemia in chronic kidney disease: Secondary | ICD-10-CM | POA: Diagnosis not present

## 2018-03-28 DIAGNOSIS — N186 End stage renal disease: Secondary | ICD-10-CM | POA: Diagnosis not present

## 2018-03-29 DIAGNOSIS — D509 Iron deficiency anemia, unspecified: Secondary | ICD-10-CM | POA: Diagnosis not present

## 2018-03-29 DIAGNOSIS — N186 End stage renal disease: Secondary | ICD-10-CM | POA: Diagnosis not present

## 2018-03-29 DIAGNOSIS — Z992 Dependence on renal dialysis: Secondary | ICD-10-CM | POA: Diagnosis not present

## 2018-03-29 DIAGNOSIS — D631 Anemia in chronic kidney disease: Secondary | ICD-10-CM | POA: Diagnosis not present

## 2018-03-29 NOTE — Telephone Encounter (Signed)
Patient daughter dropped off [employer] Patient Assistance forms to be completed, picked up samples Placed in nurse box

## 2018-03-30 DIAGNOSIS — Z992 Dependence on renal dialysis: Secondary | ICD-10-CM | POA: Diagnosis not present

## 2018-03-30 DIAGNOSIS — D509 Iron deficiency anemia, unspecified: Secondary | ICD-10-CM | POA: Diagnosis not present

## 2018-03-30 DIAGNOSIS — D631 Anemia in chronic kidney disease: Secondary | ICD-10-CM | POA: Diagnosis not present

## 2018-03-30 DIAGNOSIS — N186 End stage renal disease: Secondary | ICD-10-CM | POA: Diagnosis not present

## 2018-03-30 NOTE — Telephone Encounter (Signed)
Form received by RN. Placed in Dr Darnelle Bos basket for his signature the next time he is in the office.

## 2018-03-31 DIAGNOSIS — D631 Anemia in chronic kidney disease: Secondary | ICD-10-CM | POA: Diagnosis not present

## 2018-03-31 DIAGNOSIS — N186 End stage renal disease: Secondary | ICD-10-CM | POA: Diagnosis not present

## 2018-03-31 DIAGNOSIS — Z992 Dependence on renal dialysis: Secondary | ICD-10-CM | POA: Diagnosis not present

## 2018-03-31 DIAGNOSIS — D509 Iron deficiency anemia, unspecified: Secondary | ICD-10-CM | POA: Diagnosis not present

## 2018-04-01 DIAGNOSIS — D631 Anemia in chronic kidney disease: Secondary | ICD-10-CM | POA: Diagnosis not present

## 2018-04-01 DIAGNOSIS — Z992 Dependence on renal dialysis: Secondary | ICD-10-CM | POA: Diagnosis not present

## 2018-04-01 DIAGNOSIS — D509 Iron deficiency anemia, unspecified: Secondary | ICD-10-CM | POA: Diagnosis not present

## 2018-04-01 DIAGNOSIS — N186 End stage renal disease: Secondary | ICD-10-CM | POA: Diagnosis not present

## 2018-04-02 DIAGNOSIS — N186 End stage renal disease: Secondary | ICD-10-CM | POA: Diagnosis not present

## 2018-04-02 DIAGNOSIS — Z992 Dependence on renal dialysis: Secondary | ICD-10-CM | POA: Diagnosis not present

## 2018-04-02 DIAGNOSIS — D631 Anemia in chronic kidney disease: Secondary | ICD-10-CM | POA: Diagnosis not present

## 2018-04-02 DIAGNOSIS — D509 Iron deficiency anemia, unspecified: Secondary | ICD-10-CM | POA: Diagnosis not present

## 2018-04-03 DIAGNOSIS — D509 Iron deficiency anemia, unspecified: Secondary | ICD-10-CM | POA: Diagnosis not present

## 2018-04-03 DIAGNOSIS — Z992 Dependence on renal dialysis: Secondary | ICD-10-CM | POA: Diagnosis not present

## 2018-04-03 DIAGNOSIS — N186 End stage renal disease: Secondary | ICD-10-CM | POA: Diagnosis not present

## 2018-04-03 DIAGNOSIS — D631 Anemia in chronic kidney disease: Secondary | ICD-10-CM | POA: Diagnosis not present

## 2018-04-03 NOTE — Telephone Encounter (Signed)
Patient daughter [name] calling stating she would like a call back to get an update on paperwork  Please call back

## 2018-04-03 NOTE — Telephone Encounter (Signed)
Called patient's daughter. Let her know it is in Dr Darnelle Bos basket for signature when he is in office again. She was appreciative and asked for it to be faxed on [phone] once filled out.

## 2018-04-04 DIAGNOSIS — D631 Anemia in chronic kidney disease: Secondary | ICD-10-CM | POA: Diagnosis not present

## 2018-04-04 DIAGNOSIS — D509 Iron deficiency anemia, unspecified: Secondary | ICD-10-CM | POA: Diagnosis not present

## 2018-04-04 DIAGNOSIS — Z992 Dependence on renal dialysis: Secondary | ICD-10-CM | POA: Diagnosis not present

## 2018-04-04 DIAGNOSIS — N186 End stage renal disease: Secondary | ICD-10-CM | POA: Diagnosis not present

## 2018-04-04 NOTE — Telephone Encounter (Signed)
Patient assistance application completed and signed by Dr End. Faxed to [employer].  Left message with patient's daughter to let her know this had been done and to call back if she has any further questions.

## 2018-04-05 DIAGNOSIS — N186 End stage renal disease: Secondary | ICD-10-CM | POA: Diagnosis not present

## 2018-04-05 DIAGNOSIS — Z992 Dependence on renal dialysis: Secondary | ICD-10-CM | POA: Diagnosis not present

## 2018-04-05 DIAGNOSIS — D631 Anemia in chronic kidney disease: Secondary | ICD-10-CM | POA: Diagnosis not present

## 2018-04-05 DIAGNOSIS — D509 Iron deficiency anemia, unspecified: Secondary | ICD-10-CM | POA: Diagnosis not present

## 2018-04-06 DIAGNOSIS — D631 Anemia in chronic kidney disease: Secondary | ICD-10-CM | POA: Diagnosis not present

## 2018-04-06 DIAGNOSIS — N186 End stage renal disease: Secondary | ICD-10-CM | POA: Diagnosis not present

## 2018-04-06 DIAGNOSIS — D509 Iron deficiency anemia, unspecified: Secondary | ICD-10-CM | POA: Diagnosis not present

## 2018-04-06 DIAGNOSIS — Z992 Dependence on renal dialysis: Secondary | ICD-10-CM | POA: Diagnosis not present

## 2018-04-07 DIAGNOSIS — D631 Anemia in chronic kidney disease: Secondary | ICD-10-CM | POA: Diagnosis not present

## 2018-04-07 DIAGNOSIS — Z992 Dependence on renal dialysis: Secondary | ICD-10-CM | POA: Diagnosis not present

## 2018-04-07 DIAGNOSIS — N186 End stage renal disease: Secondary | ICD-10-CM | POA: Diagnosis not present

## 2018-04-07 DIAGNOSIS — D509 Iron deficiency anemia, unspecified: Secondary | ICD-10-CM | POA: Diagnosis not present

## 2018-04-08 DIAGNOSIS — N186 End stage renal disease: Secondary | ICD-10-CM | POA: Diagnosis not present

## 2018-04-08 DIAGNOSIS — D509 Iron deficiency anemia, unspecified: Secondary | ICD-10-CM | POA: Diagnosis not present

## 2018-04-08 DIAGNOSIS — Z992 Dependence on renal dialysis: Secondary | ICD-10-CM | POA: Diagnosis not present

## 2018-04-08 DIAGNOSIS — D631 Anemia in chronic kidney disease: Secondary | ICD-10-CM | POA: Diagnosis not present

## 2018-04-09 DIAGNOSIS — N186 End stage renal disease: Secondary | ICD-10-CM | POA: Diagnosis not present

## 2018-04-09 DIAGNOSIS — D509 Iron deficiency anemia, unspecified: Secondary | ICD-10-CM | POA: Diagnosis not present

## 2018-04-09 DIAGNOSIS — Z992 Dependence on renal dialysis: Secondary | ICD-10-CM | POA: Diagnosis not present

## 2018-04-09 DIAGNOSIS — D631 Anemia in chronic kidney disease: Secondary | ICD-10-CM | POA: Diagnosis not present

## 2018-04-10 DIAGNOSIS — D631 Anemia in chronic kidney disease: Secondary | ICD-10-CM | POA: Diagnosis not present

## 2018-04-10 DIAGNOSIS — N186 End stage renal disease: Secondary | ICD-10-CM | POA: Diagnosis not present

## 2018-04-10 DIAGNOSIS — Z992 Dependence on renal dialysis: Secondary | ICD-10-CM | POA: Diagnosis not present

## 2018-04-10 DIAGNOSIS — D509 Iron deficiency anemia, unspecified: Secondary | ICD-10-CM | POA: Diagnosis not present

## 2018-04-11 DIAGNOSIS — N186 End stage renal disease: Secondary | ICD-10-CM | POA: Diagnosis not present

## 2018-04-11 DIAGNOSIS — D631 Anemia in chronic kidney disease: Secondary | ICD-10-CM | POA: Diagnosis not present

## 2018-04-11 DIAGNOSIS — D509 Iron deficiency anemia, unspecified: Secondary | ICD-10-CM | POA: Diagnosis not present

## 2018-04-11 DIAGNOSIS — N2581 Secondary hyperparathyroidism of renal origin: Secondary | ICD-10-CM | POA: Diagnosis not present

## 2018-04-11 DIAGNOSIS — Z992 Dependence on renal dialysis: Secondary | ICD-10-CM | POA: Diagnosis not present

## 2018-04-12 DIAGNOSIS — N2581 Secondary hyperparathyroidism of renal origin: Secondary | ICD-10-CM | POA: Diagnosis not present

## 2018-04-12 DIAGNOSIS — N186 End stage renal disease: Secondary | ICD-10-CM | POA: Diagnosis not present

## 2018-04-12 DIAGNOSIS — D509 Iron deficiency anemia, unspecified: Secondary | ICD-10-CM | POA: Diagnosis not present

## 2018-04-12 DIAGNOSIS — Z992 Dependence on renal dialysis: Secondary | ICD-10-CM | POA: Diagnosis not present

## 2018-04-12 DIAGNOSIS — D631 Anemia in chronic kidney disease: Secondary | ICD-10-CM | POA: Diagnosis not present

## 2018-04-12 DIAGNOSIS — E119 Type 2 diabetes mellitus without complications: Secondary | ICD-10-CM | POA: Diagnosis not present

## 2018-04-12 NOTE — Telephone Encounter (Signed)
Received approval of [place] for [employer]. Patient will receive free of charge from 04/10/18 through 07/11/18.

## 2018-04-13 DIAGNOSIS — N186 End stage renal disease: Secondary | ICD-10-CM | POA: Diagnosis not present

## 2018-04-13 DIAGNOSIS — Z992 Dependence on renal dialysis: Secondary | ICD-10-CM | POA: Diagnosis not present

## 2018-04-13 DIAGNOSIS — N2581 Secondary hyperparathyroidism of renal origin: Secondary | ICD-10-CM | POA: Diagnosis not present

## 2018-04-13 DIAGNOSIS — D509 Iron deficiency anemia, unspecified: Secondary | ICD-10-CM | POA: Diagnosis not present

## 2018-04-13 DIAGNOSIS — D631 Anemia in chronic kidney disease: Secondary | ICD-10-CM | POA: Diagnosis not present

## 2018-04-14 DIAGNOSIS — D509 Iron deficiency anemia, unspecified: Secondary | ICD-10-CM | POA: Diagnosis not present

## 2018-04-14 DIAGNOSIS — N2581 Secondary hyperparathyroidism of renal origin: Secondary | ICD-10-CM | POA: Diagnosis not present

## 2018-04-14 DIAGNOSIS — Z992 Dependence on renal dialysis: Secondary | ICD-10-CM | POA: Diagnosis not present

## 2018-04-14 DIAGNOSIS — N186 End stage renal disease: Secondary | ICD-10-CM | POA: Diagnosis not present

## 2018-04-14 DIAGNOSIS — D631 Anemia in chronic kidney disease: Secondary | ICD-10-CM | POA: Diagnosis not present

## 2018-04-15 DIAGNOSIS — N186 End stage renal disease: Secondary | ICD-10-CM | POA: Diagnosis not present

## 2018-04-15 DIAGNOSIS — D509 Iron deficiency anemia, unspecified: Secondary | ICD-10-CM | POA: Diagnosis not present

## 2018-04-15 DIAGNOSIS — Z992 Dependence on renal dialysis: Secondary | ICD-10-CM | POA: Diagnosis not present

## 2018-04-15 DIAGNOSIS — D631 Anemia in chronic kidney disease: Secondary | ICD-10-CM | POA: Diagnosis not present

## 2018-04-15 DIAGNOSIS — N2581 Secondary hyperparathyroidism of renal origin: Secondary | ICD-10-CM | POA: Diagnosis not present

## 2018-04-16 DIAGNOSIS — D631 Anemia in chronic kidney disease: Secondary | ICD-10-CM | POA: Diagnosis not present

## 2018-04-16 DIAGNOSIS — N2581 Secondary hyperparathyroidism of renal origin: Secondary | ICD-10-CM | POA: Diagnosis not present

## 2018-04-16 DIAGNOSIS — Z992 Dependence on renal dialysis: Secondary | ICD-10-CM | POA: Diagnosis not present

## 2018-04-16 DIAGNOSIS — N186 End stage renal disease: Secondary | ICD-10-CM | POA: Diagnosis not present

## 2018-04-16 DIAGNOSIS — D509 Iron deficiency anemia, unspecified: Secondary | ICD-10-CM | POA: Diagnosis not present

## 2018-04-17 DIAGNOSIS — D509 Iron deficiency anemia, unspecified: Secondary | ICD-10-CM | POA: Diagnosis not present

## 2018-04-17 DIAGNOSIS — D631 Anemia in chronic kidney disease: Secondary | ICD-10-CM | POA: Diagnosis not present

## 2018-04-17 DIAGNOSIS — Z992 Dependence on renal dialysis: Secondary | ICD-10-CM | POA: Diagnosis not present

## 2018-04-17 DIAGNOSIS — N186 End stage renal disease: Secondary | ICD-10-CM | POA: Diagnosis not present

## 2018-04-17 DIAGNOSIS — N2581 Secondary hyperparathyroidism of renal origin: Secondary | ICD-10-CM | POA: Diagnosis not present

## 2018-04-18 DIAGNOSIS — D509 Iron deficiency anemia, unspecified: Secondary | ICD-10-CM | POA: Diagnosis not present

## 2018-04-18 DIAGNOSIS — Z992 Dependence on renal dialysis: Secondary | ICD-10-CM | POA: Diagnosis not present

## 2018-04-18 DIAGNOSIS — N186 End stage renal disease: Secondary | ICD-10-CM | POA: Diagnosis not present

## 2018-04-18 DIAGNOSIS — N2581 Secondary hyperparathyroidism of renal origin: Secondary | ICD-10-CM | POA: Diagnosis not present

## 2018-04-18 DIAGNOSIS — D631 Anemia in chronic kidney disease: Secondary | ICD-10-CM | POA: Diagnosis not present

## 2018-04-19 DIAGNOSIS — N2581 Secondary hyperparathyroidism of renal origin: Secondary | ICD-10-CM | POA: Diagnosis not present

## 2018-04-19 DIAGNOSIS — D509 Iron deficiency anemia, unspecified: Secondary | ICD-10-CM | POA: Diagnosis not present

## 2018-04-19 DIAGNOSIS — N186 End stage renal disease: Secondary | ICD-10-CM | POA: Diagnosis not present

## 2018-04-19 DIAGNOSIS — D631 Anemia in chronic kidney disease: Secondary | ICD-10-CM | POA: Diagnosis not present

## 2018-04-19 DIAGNOSIS — Z992 Dependence on renal dialysis: Secondary | ICD-10-CM | POA: Diagnosis not present

## 2018-04-20 DIAGNOSIS — N186 End stage renal disease: Secondary | ICD-10-CM | POA: Diagnosis not present

## 2018-04-20 DIAGNOSIS — Z992 Dependence on renal dialysis: Secondary | ICD-10-CM | POA: Diagnosis not present

## 2018-04-20 DIAGNOSIS — D631 Anemia in chronic kidney disease: Secondary | ICD-10-CM | POA: Diagnosis not present

## 2018-04-20 DIAGNOSIS — D509 Iron deficiency anemia, unspecified: Secondary | ICD-10-CM | POA: Diagnosis not present

## 2018-04-20 DIAGNOSIS — N2581 Secondary hyperparathyroidism of renal origin: Secondary | ICD-10-CM | POA: Diagnosis not present

## 2018-04-21 DIAGNOSIS — D631 Anemia in chronic kidney disease: Secondary | ICD-10-CM | POA: Diagnosis not present

## 2018-04-21 DIAGNOSIS — D509 Iron deficiency anemia, unspecified: Secondary | ICD-10-CM | POA: Diagnosis not present

## 2018-04-21 DIAGNOSIS — N2581 Secondary hyperparathyroidism of renal origin: Secondary | ICD-10-CM | POA: Diagnosis not present

## 2018-04-21 DIAGNOSIS — Z992 Dependence on renal dialysis: Secondary | ICD-10-CM | POA: Diagnosis not present

## 2018-04-21 DIAGNOSIS — N186 End stage renal disease: Secondary | ICD-10-CM | POA: Diagnosis not present

## 2018-04-22 DIAGNOSIS — N186 End stage renal disease: Secondary | ICD-10-CM | POA: Diagnosis not present

## 2018-04-22 DIAGNOSIS — D509 Iron deficiency anemia, unspecified: Secondary | ICD-10-CM | POA: Diagnosis not present

## 2018-04-22 DIAGNOSIS — D631 Anemia in chronic kidney disease: Secondary | ICD-10-CM | POA: Diagnosis not present

## 2018-04-22 DIAGNOSIS — Z992 Dependence on renal dialysis: Secondary | ICD-10-CM | POA: Diagnosis not present

## 2018-04-22 DIAGNOSIS — N2581 Secondary hyperparathyroidism of renal origin: Secondary | ICD-10-CM | POA: Diagnosis not present

## 2018-04-23 DIAGNOSIS — D631 Anemia in chronic kidney disease: Secondary | ICD-10-CM | POA: Diagnosis not present

## 2018-04-23 DIAGNOSIS — D509 Iron deficiency anemia, unspecified: Secondary | ICD-10-CM | POA: Diagnosis not present

## 2018-04-23 DIAGNOSIS — Z992 Dependence on renal dialysis: Secondary | ICD-10-CM | POA: Diagnosis not present

## 2018-04-23 DIAGNOSIS — N186 End stage renal disease: Secondary | ICD-10-CM | POA: Diagnosis not present

## 2018-04-23 DIAGNOSIS — N2581 Secondary hyperparathyroidism of renal origin: Secondary | ICD-10-CM | POA: Diagnosis not present

## 2018-04-24 DIAGNOSIS — Z992 Dependence on renal dialysis: Secondary | ICD-10-CM | POA: Diagnosis not present

## 2018-04-24 DIAGNOSIS — D631 Anemia in chronic kidney disease: Secondary | ICD-10-CM | POA: Diagnosis not present

## 2018-04-24 DIAGNOSIS — N2581 Secondary hyperparathyroidism of renal origin: Secondary | ICD-10-CM | POA: Diagnosis not present

## 2018-04-24 DIAGNOSIS — D509 Iron deficiency anemia, unspecified: Secondary | ICD-10-CM | POA: Diagnosis not present

## 2018-04-24 DIAGNOSIS — N186 End stage renal disease: Secondary | ICD-10-CM | POA: Diagnosis not present

## 2018-04-25 DIAGNOSIS — N186 End stage renal disease: Secondary | ICD-10-CM | POA: Diagnosis not present

## 2018-04-25 DIAGNOSIS — Z992 Dependence on renal dialysis: Secondary | ICD-10-CM | POA: Diagnosis not present

## 2018-04-25 DIAGNOSIS — D631 Anemia in chronic kidney disease: Secondary | ICD-10-CM | POA: Diagnosis not present

## 2018-04-25 DIAGNOSIS — N2581 Secondary hyperparathyroidism of renal origin: Secondary | ICD-10-CM | POA: Diagnosis not present

## 2018-04-25 DIAGNOSIS — D509 Iron deficiency anemia, unspecified: Secondary | ICD-10-CM | POA: Diagnosis not present

## 2018-04-26 DIAGNOSIS — N2581 Secondary hyperparathyroidism of renal origin: Secondary | ICD-10-CM | POA: Diagnosis not present

## 2018-04-26 DIAGNOSIS — D509 Iron deficiency anemia, unspecified: Secondary | ICD-10-CM | POA: Diagnosis not present

## 2018-04-26 DIAGNOSIS — D631 Anemia in chronic kidney disease: Secondary | ICD-10-CM | POA: Diagnosis not present

## 2018-04-26 DIAGNOSIS — Z992 Dependence on renal dialysis: Secondary | ICD-10-CM | POA: Diagnosis not present

## 2018-04-26 DIAGNOSIS — N186 End stage renal disease: Secondary | ICD-10-CM | POA: Diagnosis not present

## 2018-04-27 ENCOUNTER — Ambulatory Visit (INDEPENDENT_AMBULATORY_CARE_PROVIDER_SITE_OTHER): Payer: Medicare Other | Admitting: Vascular Surgery

## 2018-04-27 ENCOUNTER — Encounter (INDEPENDENT_AMBULATORY_CARE_PROVIDER_SITE_OTHER): Payer: Self-pay | Admitting: Vascular Surgery

## 2018-04-27 VITALS — BP 180/89 | HR 88 | Resp 18 | Ht 62.0 in | Wt 203.0 lb

## 2018-04-27 DIAGNOSIS — I480 Paroxysmal atrial fibrillation: Secondary | ICD-10-CM | POA: Diagnosis not present

## 2018-04-27 DIAGNOSIS — Z992 Dependence on renal dialysis: Secondary | ICD-10-CM

## 2018-04-27 DIAGNOSIS — E1122 Type 2 diabetes mellitus with diabetic chronic kidney disease: Secondary | ICD-10-CM

## 2018-04-27 DIAGNOSIS — I120 Hypertensive chronic kidney disease with stage 5 chronic kidney disease or end stage renal disease: Secondary | ICD-10-CM

## 2018-04-27 DIAGNOSIS — N186 End stage renal disease: Secondary | ICD-10-CM | POA: Diagnosis not present

## 2018-04-27 DIAGNOSIS — I10 Essential (primary) hypertension: Secondary | ICD-10-CM

## 2018-04-27 DIAGNOSIS — N2581 Secondary hyperparathyroidism of renal origin: Secondary | ICD-10-CM | POA: Diagnosis not present

## 2018-04-27 DIAGNOSIS — D509 Iron deficiency anemia, unspecified: Secondary | ICD-10-CM | POA: Diagnosis not present

## 2018-04-27 DIAGNOSIS — D631 Anemia in chronic kidney disease: Secondary | ICD-10-CM | POA: Diagnosis not present

## 2018-04-28 ENCOUNTER — Encounter (INDEPENDENT_AMBULATORY_CARE_PROVIDER_SITE_OTHER): Payer: Self-pay | Admitting: Vascular Surgery

## 2018-04-28 DIAGNOSIS — D509 Iron deficiency anemia, unspecified: Secondary | ICD-10-CM | POA: Diagnosis not present

## 2018-04-28 DIAGNOSIS — N186 End stage renal disease: Secondary | ICD-10-CM | POA: Diagnosis not present

## 2018-04-28 DIAGNOSIS — D631 Anemia in chronic kidney disease: Secondary | ICD-10-CM | POA: Diagnosis not present

## 2018-04-28 DIAGNOSIS — N2581 Secondary hyperparathyroidism of renal origin: Secondary | ICD-10-CM | POA: Diagnosis not present

## 2018-04-28 DIAGNOSIS — Z992 Dependence on renal dialysis: Secondary | ICD-10-CM | POA: Diagnosis not present

## 2018-04-28 NOTE — Progress Notes (Signed)
MRN : [id number]  Stacy Pham is a 84 y.o. ([birth year]) female who presents with chief complaint of  Chief Complaint  Patient presents with  . Follow-up    Discuss access placement  .  History of Present Illness:    The patient is seen for evaluation of dialysis access.  The patient has a history of multiple failed accesses.  There have been accesses in both arms and in the thighs.    Current access is via a catheter which is functioning well.  There have been several episodes of catheter infection.  The patient denies amaurosis fugax or recent TIA symptoms. There are no recent neurological changes noted. The patient denies claudication symptoms or rest pain symptoms. The patient denies history of DVT, PE or superficial thrombophlebitis. The patient denies recent episodes of angina or shortness of breath.      Current Meds  Medication Sig  . acetaminophen (TYLENOL 8 HOUR ARTHRITIS PAIN) 650 MG CR tablet Take 650 mg every 8 (eight) hours as needed by mouth for pain.  [name] allopurinol (ZYLOPRIM) 100 MG tablet TAKE 1 TABLET BY MOUTH ONCE DAILY  . apixaban (ELIQUIS) 2.5 MG TABS tablet Take 1 tablet (2.5 mg total) by mouth 2 (two) times daily.  [name] atorvastatin (LIPITOR) 20 MG tablet Take 1 tablet (20 mg total) by mouth every evening.  . blood glucose meter kit and supplies KIT Please dispense either One Touch or Bayer Contour She must have one of these machines since she is on peritoneal dialysis. Use up to four times daily as directed. (FOR ICD-9 250.00, 250.01).  . carvedilol (COREG) 6.25 MG tablet Take 1 tablet (6.25 mg total) by mouth 2 (two) times daily.  . cinacalcet (SENSIPAR) 30 MG tablet Take 1 tablet (30 mg total) by mouth daily with supper. (Patient taking differently: Take 30 mg by mouth daily. With largest meal)  . colchicine 0.6 MG tablet Take 1 tablet (0.6 mg total) by mouth daily as needed (for gout flare ups).  . ferric citrate (AURYXIA) 1 GM 210 MG(Fe) tablet Take 210  mg by mouth 3 (three) times daily with meals.  . fluticasone (FLONASE) 50 MCG/ACT nasal spray Place 1 spray into both nostrils daily as needed for allergies or rhinitis.  [name] gabapentin (NEURONTIN) 100 MG capsule Take 1 capsule (100 mg total) by mouth 3 (three) times daily.  [name] glucose blood (ONETOUCH VERIO) test strip Test blood sugar four times daily. Dx 250.00, 250.01, E11.22, N18.6  . hydroxypropyl methylcellulose / hypromellose (ISOPTO TEARS / GONIOVISC) 2.5 % ophthalmic solution Place 1-2 drops 3 (three) times daily as needed into both eyes for dry eyes.  [name] nystatin cream (MYCOSTATIN) Apply 1 application topically 2 (two) times daily. For up to 10 days.  Glory Rosebush DELICA LANCETS 55V MISC Test blood sugar four times daily. Dx 250.00, 250.01, E11.22, N18.6  . triamcinolone (KENALOG) 0.025 % ointment Apply 1 application topically 2 (two) times daily. For up to 10 days    Past Medical History:  Diagnosis Date  . Atrial fibrillation ([place])   . Cataract   . Diabetes mellitus without complication ([place])   . Dialysis patient ([place])    2/19- changed from hemodialysis to nightly peritoneal  . Hypertension   . Renal failure     Past Surgical History:  Procedure Laterality Date  . CAPD INSERTION N/A 06/08/2017   Procedure: LAPAROSCOPIC INSERTION CONTINUOUS AMBULATORY PERITONEAL DIALYSIS  (CAPD) CATHETER;  Surgeon: Katha Cabal, MD;  Location: [facility]S;  Service: Vascular;  Laterality: N/A;  . CATARACT EXTRACTION, BILATERAL Bilateral   . DIALYSIS/PERMA CATHETER INSERTION N/A 03/18/2017   Procedure: DIALYSIS/PERMA CATHETER INSERTION;  Surgeon: Katha Cabal, MD;  Location: [place] [facility];  Service: Cardiovascular;  Laterality: N/A;  . DIALYSIS/PERMA CATHETER REMOVAL N/A 10/20/2017   Procedure: DIALYSIS/PERMA CATHETER REMOVAL;  Surgeon: Algernon Huxley, MD;  Location: [facility];  Service: Cardiovascular;  Laterality: N/A;    Social History Social History   Tobacco Use  .  Smoking status: Never Smoker  . Smokeless tobacco: Never Used  Substance Use Topics  . Alcohol use: No  . Drug use: No    Family History Family History  Problem Relation Age of Onset  . Hypertension Father     No Known Allergies   REVIEW OF SYSTEMS (Negative unless checked)  Constitutional: '[]' Weight loss  '[]' Fever  '[]' Chills Cardiac: '[]' Chest pain   '[]' Chest pressure   '[]' Palpitations   '[]' Shortness of breath when laying flat   '[]' Shortness of breath with exertion. Vascular:  '[]' Pain in legs with walking   '[]' Pain in legs at rest  '[]' History of DVT   '[]' Phlebitis   '[]' Swelling in legs   '[]' Varicose veins   '[]' Non-healing ulcers Pulmonary:   '[]' Uses home oxygen   '[]' Productive cough   '[]' Hemoptysis   '[]' Wheeze  '[]' COPD   '[]' Asthma Neurologic:  '[]' Dizziness   '[]' Seizures   '[]' History of stroke   '[]' History of TIA  '[]' Aphasia   '[]' Vissual changes   '[]' Weakness or numbness in arm   '[]' Weakness or numbness in leg Musculoskeletal:   '[]' Joint swelling   '[]' Joint pain   '[]' Low back pain Hematologic:  '[]' Easy bruising  '[]' Easy bleeding   '[]' Hypercoagulable state   '[]' Anemic Gastrointestinal:  '[]' Diarrhea   '[]' Vomiting  '[]' Gastroesophageal reflux/heartburn   '[]' Difficulty swallowing. Genitourinary:  '[x]' Chronic kidney disease   '[]' Difficult urination  '[]' Frequent urination   '[]' Blood in urine Skin:  '[]' Rashes   '[]' Ulcers  Psychological:  '[]' History of anxiety   '[]'  History of major depression.  Physical Examination  Vitals:   04/27/18 1040  BP: (!) 180/89  Pulse: 88  Resp: 18  Weight: 203 lb (92.1 kg)  Height: '5\' 2"'  (1.575 m)   Body mass index is 37.13 kg/m. Gen: WD/WN, NAD Head: [place]/AT, No temporalis wasting.  Ear/Nose/Throat: Hearing grossly intact, nares w/o erythema or drainage Eyes: PER, EOMI, sclera nonicteric.  Neck: Supple, no large masses.   Pulmonary:  Good air movement, no audible wheezing bilaterally, no use of accessory muscles.  Cardiac: RRR, no JVD Vascular:  Catheter CD&I Vessel Right Left  Radial Palpable  Palpable  Ulnar Palpable Palpable  Brachial Palpable Palpable  Gastrointestinal: Non-distended. No guarding/no peritoneal signs.  Musculoskeletal: M/S 5/5 throughout.  No deformity or atrophy.  Neurologic: CN 2-12 intact. Symmetrical.  Speech is fluent. Motor exam as listed above. Psychiatric: Judgment intact, Mood & affect appropriate for pt's clinical situation. Dermatologic: No rashes or ulcers noted.  No changes consistent with cellulitis. Lymph : No lichenification or skin changes of chronic lymphedema.  CBC Lab Results  Component Value Date   WBC 10.2 02/21/2018   HGB 11.5 (L) 02/21/2018   HCT 33.7 (L) 02/21/2018   MCV 92.4 02/21/2018   PLT 252 02/21/2018    BMET    Component Value Date/Time   NA 135 03/17/2018 1103   K 4.4 03/17/2018 1103   CL 92 (L) 03/17/2018 1103   CO2 30 03/17/2018 1103   GLUCOSE 111 (H) 03/17/2018 1103   BUN 51 (  H) 03/17/2018 1103   CREATININE 10.23 (HH) 03/17/2018 1103   CALCIUM 8.9 03/17/2018 1103   GFRNONAA 3 (L) 02/21/2018 1220   GFRAA 3 (L) 02/21/2018 1220   CrCl cannot be calculated (Patient's most recent lab result is older than the maximum 21 days allowed.).  COAG Lab Results  Component Value Date   INR 1.08 06/08/2017   INR 1.09 04/22/2017   INR 1.22 03/18/2017    Radiology No results found.   Assessment/Plan 1. ESRD (end stage renal disease) ([place]) Recommend:  The patient currently has catheter based access.  Patient should have vein mapping of her right arm to plan for upper extremity access creation.  The goal is to improve the quality of dialysis therapy.  The risks, benefits and alternative therapies with respect to to the different kinds of dialysis accesee were reviewed in detail with the patient.  All questions were answered.  The patient agrees to proceed with mapping.     A total of 30 minutes was spent with this patient and greater than 50% was spent in counseling and coordination of care with the patient.   Discussion included the treatment options for vascular access crreation including indications for surgery and intervention.  Also discussed is the appropriate timing of treatment.  In addition medical therapy was discussed.  - VAS [place] UPPER EXT VEIN MAPPING (PRE-OP AVF); Future  2. Type 2 diabetes mellitus with chronic kidney disease on chronic dialysis, without long-term current use of insulin (HCC) Continue hypoglycemic medications as already ordered, these medications have been reviewed and there are no changes at this time.  Hgb A1C to be monitored as already arranged by primary service   3. Hypertension, unspecified type Continue antihypertensive medications as already ordered, these medications have been reviewed and there are no changes at this time.   4. Paroxysmal atrial fibrillation (HCC) Continue antiarrhythmia medications as already ordered, these medications have been reviewed and there are no changes at this time.  Continue anticoagulation as ordered by Cardiology Service     Hortencia Pilar, MD  04/28/2018 7:53 AM

## 2018-04-29 DIAGNOSIS — D631 Anemia in chronic kidney disease: Secondary | ICD-10-CM | POA: Diagnosis not present

## 2018-04-29 DIAGNOSIS — D509 Iron deficiency anemia, unspecified: Secondary | ICD-10-CM | POA: Diagnosis not present

## 2018-04-29 DIAGNOSIS — N2581 Secondary hyperparathyroidism of renal origin: Secondary | ICD-10-CM | POA: Diagnosis not present

## 2018-04-29 DIAGNOSIS — N186 End stage renal disease: Secondary | ICD-10-CM | POA: Diagnosis not present

## 2018-04-29 DIAGNOSIS — Z992 Dependence on renal dialysis: Secondary | ICD-10-CM | POA: Diagnosis not present

## 2018-04-30 DIAGNOSIS — Z992 Dependence on renal dialysis: Secondary | ICD-10-CM | POA: Diagnosis not present

## 2018-04-30 DIAGNOSIS — D631 Anemia in chronic kidney disease: Secondary | ICD-10-CM | POA: Diagnosis not present

## 2018-04-30 DIAGNOSIS — N186 End stage renal disease: Secondary | ICD-10-CM | POA: Diagnosis not present

## 2018-04-30 DIAGNOSIS — D509 Iron deficiency anemia, unspecified: Secondary | ICD-10-CM | POA: Diagnosis not present

## 2018-04-30 DIAGNOSIS — N2581 Secondary hyperparathyroidism of renal origin: Secondary | ICD-10-CM | POA: Diagnosis not present

## 2018-05-01 ENCOUNTER — Ambulatory Visit (INDEPENDENT_AMBULATORY_CARE_PROVIDER_SITE_OTHER): Payer: Medicare Other | Admitting: Vascular Surgery

## 2018-05-01 ENCOUNTER — Ambulatory Visit (INDEPENDENT_AMBULATORY_CARE_PROVIDER_SITE_OTHER): Payer: Medicare Other

## 2018-05-01 ENCOUNTER — Other Ambulatory Visit (INDEPENDENT_AMBULATORY_CARE_PROVIDER_SITE_OTHER): Payer: Self-pay | Admitting: Vascular Surgery

## 2018-05-01 DIAGNOSIS — I480 Paroxysmal atrial fibrillation: Secondary | ICD-10-CM

## 2018-05-01 DIAGNOSIS — N186 End stage renal disease: Secondary | ICD-10-CM | POA: Diagnosis not present

## 2018-05-01 DIAGNOSIS — E785 Hyperlipidemia, unspecified: Secondary | ICD-10-CM | POA: Diagnosis not present

## 2018-05-01 DIAGNOSIS — I10 Essential (primary) hypertension: Secondary | ICD-10-CM

## 2018-05-01 DIAGNOSIS — Z992 Dependence on renal dialysis: Secondary | ICD-10-CM | POA: Diagnosis not present

## 2018-05-01 DIAGNOSIS — D509 Iron deficiency anemia, unspecified: Secondary | ICD-10-CM | POA: Diagnosis not present

## 2018-05-01 DIAGNOSIS — D631 Anemia in chronic kidney disease: Secondary | ICD-10-CM | POA: Diagnosis not present

## 2018-05-01 DIAGNOSIS — I120 Hypertensive chronic kidney disease with stage 5 chronic kidney disease or end stage renal disease: Secondary | ICD-10-CM | POA: Diagnosis not present

## 2018-05-01 DIAGNOSIS — N2581 Secondary hyperparathyroidism of renal origin: Secondary | ICD-10-CM | POA: Diagnosis not present

## 2018-05-02 DIAGNOSIS — D509 Iron deficiency anemia, unspecified: Secondary | ICD-10-CM | POA: Diagnosis not present

## 2018-05-02 DIAGNOSIS — N186 End stage renal disease: Secondary | ICD-10-CM | POA: Diagnosis not present

## 2018-05-02 DIAGNOSIS — Z992 Dependence on renal dialysis: Secondary | ICD-10-CM | POA: Diagnosis not present

## 2018-05-02 DIAGNOSIS — D631 Anemia in chronic kidney disease: Secondary | ICD-10-CM | POA: Diagnosis not present

## 2018-05-02 DIAGNOSIS — N2581 Secondary hyperparathyroidism of renal origin: Secondary | ICD-10-CM | POA: Diagnosis not present

## 2018-05-03 ENCOUNTER — Encounter: Payer: Self-pay | Admitting: Internal Medicine

## 2018-05-03 ENCOUNTER — Ambulatory Visit (INDEPENDENT_AMBULATORY_CARE_PROVIDER_SITE_OTHER): Payer: Medicare Other | Admitting: Internal Medicine

## 2018-05-03 VITALS — BP 150/74 | HR 81 | Ht 62.0 in | Wt 204.0 lb

## 2018-05-03 DIAGNOSIS — R7989 Other specified abnormal findings of blood chemistry: Secondary | ICD-10-CM | POA: Diagnosis not present

## 2018-05-03 DIAGNOSIS — Z992 Dependence on renal dialysis: Secondary | ICD-10-CM | POA: Diagnosis not present

## 2018-05-03 DIAGNOSIS — N2581 Secondary hyperparathyroidism of renal origin: Secondary | ICD-10-CM | POA: Diagnosis not present

## 2018-05-03 DIAGNOSIS — E785 Hyperlipidemia, unspecified: Secondary | ICD-10-CM | POA: Diagnosis not present

## 2018-05-03 DIAGNOSIS — I10 Essential (primary) hypertension: Secondary | ICD-10-CM

## 2018-05-03 DIAGNOSIS — D509 Iron deficiency anemia, unspecified: Secondary | ICD-10-CM | POA: Diagnosis not present

## 2018-05-03 DIAGNOSIS — Z13220 Encounter for screening for lipoid disorders: Secondary | ICD-10-CM | POA: Diagnosis not present

## 2018-05-03 DIAGNOSIS — I5032 Chronic diastolic (congestive) heart failure: Secondary | ICD-10-CM

## 2018-05-03 DIAGNOSIS — R778 Other specified abnormalities of plasma proteins: Secondary | ICD-10-CM

## 2018-05-03 DIAGNOSIS — N186 End stage renal disease: Secondary | ICD-10-CM | POA: Diagnosis not present

## 2018-05-03 DIAGNOSIS — I480 Paroxysmal atrial fibrillation: Secondary | ICD-10-CM

## 2018-05-03 DIAGNOSIS — D631 Anemia in chronic kidney disease: Secondary | ICD-10-CM | POA: Diagnosis not present

## 2018-05-03 NOTE — Patient Instructions (Signed)
Medication Instructions:  Your physician recommends that you continue on your current medications as directed. Please refer to the Current Medication list given to you today.  If you need a refill on your cardiac medications before your next appointment, please call your pharmacy.   Lab work: Your physician recommends that you return for lab work in: TODAY - LIPID, ALT.  If you have labs (blood work) drawn today and your tests are completely normal, you will receive your results only by: [name] MyChart Message (if you have MyChart) OR . A paper copy in the mail If you have any lab test that is abnormal or we need to change your treatment, we will call you to review the results.  Testing/Procedures: none  Follow-Up: At [facility], you and your health needs are our priority.  As part of our continuing mission to provide you with exceptional heart care, we have created designated Provider Care Teams.  These Care Teams include your primary Cardiologist (physician) and Advanced Practice Providers (APPs -  Physician Assistants and Nurse Practitioners) who all work together to provide you with the care you need, when you need it. You will need a follow up appointment in 6 months.   Please call our office 2 months in advance to schedule this appointment.   You may see Nelva Bush, MD or one of the following Advanced Practice Providers on your designated Care Team:   Murray Hodgkins, NP Christell Faith, PA-C . Marrianne Mood, PA-C

## 2018-05-03 NOTE — Progress Notes (Signed)
Follow-up Outpatient Visit Date: 05/03/2018  Primary Care Provider: Elby Beck, [place] [address]  Chief Complaint: Follow-up HFpEF and PAF  HPI:  Stacy Pham is a 84 y.o. year-old female with history of paroxysmal atrial fibrillation, HFpEF, hypertension, diabetes mellitus, and ESRD, who presents for follow-up of atrial fibrillation and HFpEF.  I last saw her in June, at which time she was doing well.  She had transitioned to peritoneal dialysis without difficulty.  She was seen in the ED in August due to a-fib with RVR noted at a nephrology appointment.  She had missed apixaban for 2 days prior to that and had also missed occasional doses of carvedilol.  She spontaneous converted to sinus rhythm while in the ED.  Today, Ms. Moxley reports feeling well.  She has gained weight since beginning peritoneal dialysis and is also facing some other logistical issues.  She is planning to transition back to HD once access has been established.  She has not had any palpitations, lightheadedness, chest pain, or shortness of breath.  On the day that she was sent to the ED with a-fib with RVR, her only symptom was a hot feeling.  This has not recurred.  She reports being compliant with apixaban without significant bleeding.  Her daughter does not give Ms. Perdomo her schedule carvedilol if her systolic pressure is less than 120 mmHg in order to minimize the risk for hypotension when cycling her PD.  She has not taken carvedilol yet this morning.  BP at home this AM was 131/75, which is typical.  --------------------------------------------------------------------------------------------------  Past Medical History:  Diagnosis Date  . Atrial fibrillation ([place])   . Cataract   . Diabetes mellitus without complication ([place])   . Dialysis patient ([place])    2/19- changed from hemodialysis to nightly peritoneal  . Hypertension   . Renal failure    Past Surgical History:    Procedure Laterality Date  . CAPD INSERTION N/A 06/08/2017   Procedure: LAPAROSCOPIC INSERTION CONTINUOUS AMBULATORY PERITONEAL DIALYSIS  (CAPD) CATHETER;  Surgeon: Katha Cabal, MD;  Location: [facility] ORS;  Service: Vascular;  Laterality: N/A;  . CATARACT EXTRACTION, BILATERAL Bilateral   . DIALYSIS/PERMA CATHETER INSERTION N/A 03/18/2017   Procedure: DIALYSIS/PERMA CATHETER INSERTION;  Surgeon: Katha Cabal, MD;  Location: [facility];  Service: Cardiovascular;  Laterality: N/A;  . DIALYSIS/PERMA CATHETER REMOVAL N/A 10/20/2017   Procedure: DIALYSIS/PERMA CATHETER REMOVAL;  Surgeon: Algernon Huxley, MD;  Location: [place];  Service: Cardiovascular;  Laterality: N/A;    Current Meds  Medication Sig  . acetaminophen (TYLENOL 8 HOUR ARTHRITIS PAIN) 650 MG CR tablet Take 650 mg every 8 (eight) hours as needed by mouth for pain.  [name] allopurinol (ZYLOPRIM) 100 MG tablet TAKE 1 TABLET BY MOUTH ONCE DAILY  . apixaban (ELIQUIS) 2.5 MG TABS tablet Take 1 tablet (2.5 mg total) by mouth 2 (two) times daily.  [name] atorvastatin (LIPITOR) 20 MG tablet Take 1 tablet (20 mg total) by mouth every evening.  . blood glucose meter kit and supplies KIT Please dispense either One Touch or Bayer Contour She must have one of these machines since she is on peritoneal dialysis. Use up to four times daily as directed. (FOR ICD-9 250.00, 250.01).  . carvedilol (COREG) 6.25 MG tablet Take 1 tablet (6.25 mg total) by mouth 2 (two) times daily.  . cinacalcet (SENSIPAR) 30 MG tablet Take 1 tablet (30 mg total) by mouth daily  with supper. (Patient taking differently: Take 30 mg by mouth daily. With largest meal)  . colchicine 0.6 MG tablet Take 1 tablet (0.6 mg total) by mouth daily as needed (for gout flare ups).  . ferric citrate (AURYXIA) 1 GM 210 MG(Fe) tablet Take 210 mg by mouth 3 (three) times daily with meals.  . fluticasone (FLONASE) 50 MCG/ACT nasal spray Place 1 spray into both nostrils daily  as needed for allergies or rhinitis.  [name] gabapentin (NEURONTIN) 100 MG capsule Take 1 capsule (100 mg total) by mouth 3 (three) times daily.  [name] glucose blood (ONETOUCH VERIO) test strip Test blood sugar four times daily. Dx 250.00, 250.01, E11.22, N18.6  . hydroxypropyl methylcellulose / hypromellose (ISOPTO TEARS / GONIOVISC) 2.5 % ophthalmic solution Place 1-2 drops 3 (three) times daily as needed into both eyes for dry eyes.  [name] nystatin cream (MYCOSTATIN) Apply 1 application topically 2 (two) times daily. For up to 10 days.  Glory Rosebush DELICA LANCETS 74J MISC Test blood sugar four times daily. Dx 250.00, 250.01, E11.22, N18.6  . triamcinolone (KENALOG) 0.025 % ointment Apply 1 application topically 2 (two) times daily. For up to 10 days    Allergies: Patient has no known allergies.  Social History   Tobacco Use  . Smoking status: Never Smoker  . Smokeless tobacco: Never Used  Substance Use Topics  . Alcohol use: No  . Drug use: No    Family History  Problem Relation Age of Onset  . Hypertension Father     Review of Systems: A 12-system review of systems was performed and was negative except as noted in the HPI.  --------------------------------------------------------------------------------------------------  Physical Exam: BP (!) 150/74 (BP Location: Right Arm, Patient Position: Sitting, Cuff Size: Normal)   Pulse 81   Ht '5\' 2"'  (1.575 m)   Wt 204 lb (92.5 kg)   BMI 37.31 kg/m   General:  NAD. HEENT: No conjunctival pallor or scleral icterus. Moist mucous membranes.  OP clear. Neck: Supple without lymphadenopathy, thyromegaly, JVD, or HJR.  Lungs: Normal work of breathing. Diminished breath sounds at both bases.  No wheezes or crackles. Heart: Regular rate and rhythm without murmurs, rubs, or gallops. Unable to assess PMI due to body habitus. Abd: Bowel sounds present. Soft, NT and mildly distended.  Unable to assess HSM due to body habitus. Ext: 1+ pretibial edema  bilaterally. Skin: Warm and dry without rash.  EKG:  Normal sinus rhythm with PAC's and non-specific ST segment changes.  Lab Results  Component Value Date   WBC 10.2 02/21/2018   HGB 11.5 (L) 02/21/2018   HCT 33.7 (L) 02/21/2018   MCV 92.4 02/21/2018   PLT 252 02/21/2018    Lab Results  Component Value Date   NA 135 03/17/2018   K 4.4 03/17/2018   CL 92 (L) 03/17/2018   CO2 30 03/17/2018   BUN 51 (H) 03/17/2018   CREATININE 10.23 (HH) 03/17/2018   GLUCOSE 111 (H) 03/17/2018   ALT 16 03/18/2017    No results found for: CHOL, HDL, LDLCALC, LDLDIRECT, TRIG, CHOLHDL  --------------------------------------------------------------------------------------------------  ASSESSMENT AND PLAN: Paroxysmal atrial fibrillation No recurrence of symptoms (feeling hot) since August.  We will continue indefinite anticoagulation with apixaban 2.5 mg BID.  I encouraged Ms. Penny to take carvedilol unless her blood pressure is low.  No further intervention at this time.  HFpEF Mild edema noted on exam today.  No symptoms of HF reported by Ms. Wisnieski, though her activity is quite limited due to  chronic back pain.  Continue volume management per PD/nephrology.  Continue current dose of carvedilol.  Hypertension Modestly elevated today but typically better at home (patient has not taken carvedilol yet today).  In encouraged compliance with medication.  Adjustments may need to be made by nephrology as the patient transitions from PD back to HD.  Hyperlipidemia No lipid panel in our system.  We will check a lipid panel and ALT today; continue current dose of atorvastatin for now.  Elevated troponin Minimal elevation noted in the ED in August in the setting of a-fib with RVR.  Troponin I was flat at 0.07.  Given lack of symptoms, I suspect this represented supply-demand mismatch.  We have agreed to defer further workup for now.  Follow-up: Return to clinic in 6 months.  Nelva Bush,  MD 05/03/2018 3:01 PM

## 2018-05-04 ENCOUNTER — Telehealth (INDEPENDENT_AMBULATORY_CARE_PROVIDER_SITE_OTHER): Payer: Self-pay | Admitting: Vascular Surgery

## 2018-05-04 DIAGNOSIS — N186 End stage renal disease: Secondary | ICD-10-CM | POA: Diagnosis not present

## 2018-05-04 DIAGNOSIS — N2581 Secondary hyperparathyroidism of renal origin: Secondary | ICD-10-CM | POA: Diagnosis not present

## 2018-05-04 DIAGNOSIS — D631 Anemia in chronic kidney disease: Secondary | ICD-10-CM | POA: Diagnosis not present

## 2018-05-04 DIAGNOSIS — Z992 Dependence on renal dialysis: Secondary | ICD-10-CM | POA: Diagnosis not present

## 2018-05-04 DIAGNOSIS — D509 Iron deficiency anemia, unspecified: Secondary | ICD-10-CM | POA: Diagnosis not present

## 2018-05-04 LAB — LIPID PANEL
CHOL/HDL RATIO: 3.9 ratio (ref 0.0–4.4)
CHOLESTEROL TOTAL: 179 mg/dL (ref 100–199)
HDL: 46 mg/dL (ref >39)
LDL Calculated: 111 mg/dL — ABNORMAL HIGH (ref 0–99)
TRIGLYCERIDES: 108 mg/dL (ref 0–149)
VLDL CHOLESTEROL CAL: 22 mg/dL (ref 5–40)

## 2018-05-04 LAB — ALT: ALT: 14 IU/L (ref 0–32)

## 2018-05-04 NOTE — Telephone Encounter (Signed)
Patient will be receiving a call from [employer] about results

## 2018-05-05 DIAGNOSIS — N186 End stage renal disease: Secondary | ICD-10-CM | POA: Diagnosis not present

## 2018-05-05 DIAGNOSIS — D509 Iron deficiency anemia, unspecified: Secondary | ICD-10-CM | POA: Diagnosis not present

## 2018-05-05 DIAGNOSIS — N2581 Secondary hyperparathyroidism of renal origin: Secondary | ICD-10-CM | POA: Diagnosis not present

## 2018-05-05 DIAGNOSIS — Z992 Dependence on renal dialysis: Secondary | ICD-10-CM | POA: Diagnosis not present

## 2018-05-05 DIAGNOSIS — D631 Anemia in chronic kidney disease: Secondary | ICD-10-CM | POA: Diagnosis not present

## 2018-05-06 DIAGNOSIS — N186 End stage renal disease: Secondary | ICD-10-CM | POA: Diagnosis not present

## 2018-05-06 DIAGNOSIS — D509 Iron deficiency anemia, unspecified: Secondary | ICD-10-CM | POA: Diagnosis not present

## 2018-05-06 DIAGNOSIS — N2581 Secondary hyperparathyroidism of renal origin: Secondary | ICD-10-CM | POA: Diagnosis not present

## 2018-05-06 DIAGNOSIS — Z992 Dependence on renal dialysis: Secondary | ICD-10-CM | POA: Diagnosis not present

## 2018-05-06 DIAGNOSIS — D631 Anemia in chronic kidney disease: Secondary | ICD-10-CM | POA: Diagnosis not present

## 2018-05-07 ENCOUNTER — Encounter (INDEPENDENT_AMBULATORY_CARE_PROVIDER_SITE_OTHER): Payer: Self-pay | Admitting: Vascular Surgery

## 2018-05-07 DIAGNOSIS — D631 Anemia in chronic kidney disease: Secondary | ICD-10-CM | POA: Diagnosis not present

## 2018-05-07 DIAGNOSIS — Z992 Dependence on renal dialysis: Secondary | ICD-10-CM | POA: Diagnosis not present

## 2018-05-07 DIAGNOSIS — N2581 Secondary hyperparathyroidism of renal origin: Secondary | ICD-10-CM | POA: Diagnosis not present

## 2018-05-07 DIAGNOSIS — N186 End stage renal disease: Secondary | ICD-10-CM | POA: Diagnosis not present

## 2018-05-07 DIAGNOSIS — D509 Iron deficiency anemia, unspecified: Secondary | ICD-10-CM | POA: Diagnosis not present

## 2018-05-07 NOTE — Progress Notes (Signed)
MRN : [id number]  Stacy Pham is a 84 y.o. (04-11-1931) female who presents with chief complaint of No chief complaint on file. [name]  History of Present Illness:   The patient returns to the office for follow up regarding problem with the dialysis access. Currently the patient is maintained via a catheter.    The patient notes a significant increase in bleeding time after decannulation.  The patient has also been informed that there is increased recirculation.    The patient denies hand pain or other symptoms consistent with steal phenomena.  No significant arm swelling.  The patient denies redness or swelling at the access site. The patient denies fever or chills at home or while on dialysis.  The patient denies amaurosis fugax or recent TIA symptoms. There are no recent neurological changes noted. The patient denies claudication symptoms or rest pain symptoms. The patient denies history of DVT, PE or superficial thrombophlebitis. The patient denies recent episodes of angina or shortness of breath.      No outpatient medications have been marked as taking for the 05/01/18 encounter (Office Visit) with Delana Meyer, Dolores Lory, MD.    Past Medical History:  Diagnosis Date  . Atrial fibrillation ([place])   . Cataract   . Diabetes mellitus without complication ([place])   . Dialysis patient ([place])    2/19- changed from hemodialysis to nightly peritoneal  . Hypertension   . Renal failure     Past Surgical History:  Procedure Laterality Date  . CAPD INSERTION N/A 06/08/2017   Procedure: LAPAROSCOPIC INSERTION CONTINUOUS AMBULATORY PERITONEAL DIALYSIS  (CAPD) CATHETER;  Surgeon: Katha Cabal, MD;  Location: [facility] ORS;  Service: Vascular;  Laterality: N/A;  . CATARACT EXTRACTION, BILATERAL Bilateral   . DIALYSIS/PERMA CATHETER INSERTION N/A 03/18/2017   Procedure: DIALYSIS/PERMA CATHETER INSERTION;  Surgeon: Katha Cabal, MD;  Location: [place] [facility];  Service: Cardiovascular;   Laterality: N/A;  . DIALYSIS/PERMA CATHETER REMOVAL N/A 10/20/2017   Procedure: DIALYSIS/PERMA CATHETER REMOVAL;  Surgeon: Algernon Huxley, MD;  Location: [place] [facility];  Service: Cardiovascular;  Laterality: N/A;    Social History Social History   Tobacco Use  . Smoking status: Never Smoker  . Smokeless tobacco: Never Used  Substance Use Topics  . Alcohol use: No  . Drug use: No    Family History Family History  Problem Relation Age of Onset  . Hypertension Father     No Known Allergies   REVIEW OF SYSTEMS (Negative unless checked)  Constitutional: [] Weight loss  [] Fever  [] Chills Cardiac: [] Chest pain   [] Chest pressure   [] Palpitations   [] Shortness of breath when laying flat   [] Shortness of breath with exertion. Vascular:  [] Pain in legs with walking   [] Pain in legs at rest  [] History of DVT   [] Phlebitis   [] Swelling in legs   [] Varicose veins   [] Non-healing ulcers Pulmonary:   [] Uses home oxygen   [] Productive cough   [] Hemoptysis   [] Wheeze  [] COPD   [] Asthma Neurologic:  [] Dizziness   [] Seizures   [] History of stroke   [] History of TIA  [] Aphasia   [] Vissual changes   [] Weakness or numbness in arm   [] Weakness or numbness in leg Musculoskeletal:   [] Joint swelling   [] Joint pain   [] Low back pain Hematologic:  [] Easy bruising  [] Easy bleeding   [] Hypercoagulable state   [] Anemic Gastrointestinal:  [] Diarrhea   [] Vomiting  [] Gastroesophageal reflux/heartburn   [] Difficulty swallowing. Genitourinary:  [x] Chronic kidney disease   []   Difficult urination  [] Frequent urination   [] Blood in urine Skin:  [] Rashes   [] Ulcers  Psychological:  [] History of anxiety   []  History of major depression.  Physical Examination  There were no vitals filed for this visit. There is no height or weight on file to calculate BMI. Gen: WD/WN, NAD Head: [place]/AT, No temporalis wasting.  Ear/Nose/Throat: Hearing grossly intact, nares w/o erythema or drainage Eyes: PER, EOMI, sclera  nonicteric.  Neck: Supple, no large masses.   Pulmonary:  Good air movement, no audible wheezing bilaterally, no use of accessory muscles.  Cardiac: RRR, no JVD Vascular: PD catheter Vessel Right Left  Radial Palpable Palpable  Ulnar Palpable Palpable  Brachial Palpable Palpable  Carotid Palpable Palpable  Gastrointestinal: Non-distended. No guarding/no peritoneal signs.  Musculoskeletal: M/S 5/5 throughout.  No deformity or atrophy.  Neurologic: CN 2-12 intact. Symmetrical.  Speech is fluent. Motor exam as listed above. Psychiatric: Judgment intact, Mood & affect appropriate for pt's clinical situation. Dermatologic: No rashes or ulcers noted.  No changes consistent with cellulitis. Lymph : No lichenification or skin changes of chronic lymphedema.  CBC Lab Results  Component Value Date   WBC 10.2 02/21/2018   HGB 11.5 (L) 02/21/2018   HCT 33.7 (L) 02/21/2018   MCV 92.4 02/21/2018   PLT 252 02/21/2018    BMET    Component Value Date/Time   NA 135 03/17/2018 1103   K 4.4 03/17/2018 1103   CL 92 (L) 03/17/2018 1103   CO2 30 03/17/2018 1103   GLUCOSE 111 (H) 03/17/2018 1103   BUN 51 (H) 03/17/2018 1103   CREATININE 10.23 (HH) 03/17/2018 1103   CALCIUM 8.9 03/17/2018 1103   GFRNONAA 3 (L) 02/21/2018 1220   GFRAA 3 (L) 02/21/2018 1220   CrCl cannot be calculated (Patient's most recent lab result is older than the maximum 21 days allowed.).  COAG Lab Results  Component Value Date   INR 1.08 06/08/2017   INR 1.09 04/22/2017   INR 1.22 03/18/2017    Radiology No results found.   Assessment/Plan 1. Stage 5 chronic kidney disease on chronic dialysis ([place]) Recommend:  At this time the patient does not have appropriate extremity access for dialysis  Patient should have a left arm access created.  Cephalic vein at the antecubital fossa is marginal.  No perforator identified.  May need a brachial axillary graft.  The risks, benefits and alternative therapies were  reviewed in detail with the patient.  All questions were answered.  The patient wishes to consider this and is not ready to proceed with surgery.    2. Hypertension, unspecified type Continue antihypertensive medications as already ordered, these medications have been reviewed and there are no changes at this time.   3. Paroxysmal atrial fibrillation (HCC) Continue antiarrhythmia medications as already ordered, these medications have been reviewed and there are no changes at this time.  Continue anticoagulation as ordered by Cardiology Service   4. Hyperlipidemia, unspecified hyperlipidemia type Continue statin as ordered and reviewed, no changes at this time     Hortencia Pilar, MD  05/07/2018 12:52 PM

## 2018-05-08 ENCOUNTER — Other Ambulatory Visit: Payer: Self-pay | Admitting: Emergency Medicine

## 2018-05-08 ENCOUNTER — Other Ambulatory Visit: Payer: Self-pay | Admitting: Family Medicine

## 2018-05-08 ENCOUNTER — Encounter: Payer: Self-pay | Admitting: Family Medicine

## 2018-05-08 DIAGNOSIS — N186 End stage renal disease: Principal | ICD-10-CM

## 2018-05-08 DIAGNOSIS — Z992 Dependence on renal dialysis: Principal | ICD-10-CM

## 2018-05-08 DIAGNOSIS — D509 Iron deficiency anemia, unspecified: Secondary | ICD-10-CM | POA: Diagnosis not present

## 2018-05-08 DIAGNOSIS — N2581 Secondary hyperparathyroidism of renal origin: Secondary | ICD-10-CM | POA: Diagnosis not present

## 2018-05-08 DIAGNOSIS — E1122 Type 2 diabetes mellitus with diabetic chronic kidney disease: Secondary | ICD-10-CM

## 2018-05-08 DIAGNOSIS — D631 Anemia in chronic kidney disease: Secondary | ICD-10-CM | POA: Diagnosis not present

## 2018-05-08 MED ORDER — ATORVASTATIN CALCIUM 20 MG PO TABS: 20.0000 mg | ORAL_TABLET | Freq: Every evening | ORAL | 1 refills | Status: DC

## 2018-05-09 DIAGNOSIS — D509 Iron deficiency anemia, unspecified: Secondary | ICD-10-CM | POA: Diagnosis not present

## 2018-05-09 DIAGNOSIS — Z992 Dependence on renal dialysis: Secondary | ICD-10-CM | POA: Diagnosis not present

## 2018-05-09 DIAGNOSIS — N2581 Secondary hyperparathyroidism of renal origin: Secondary | ICD-10-CM | POA: Diagnosis not present

## 2018-05-09 DIAGNOSIS — N186 End stage renal disease: Secondary | ICD-10-CM | POA: Diagnosis not present

## 2018-05-09 DIAGNOSIS — D631 Anemia in chronic kidney disease: Secondary | ICD-10-CM | POA: Diagnosis not present

## 2018-05-10 ENCOUNTER — Other Ambulatory Visit: Payer: Self-pay | Admitting: Emergency Medicine

## 2018-05-10 DIAGNOSIS — N186 End stage renal disease: Secondary | ICD-10-CM | POA: Diagnosis not present

## 2018-05-10 DIAGNOSIS — D631 Anemia in chronic kidney disease: Secondary | ICD-10-CM | POA: Diagnosis not present

## 2018-05-10 DIAGNOSIS — N2581 Secondary hyperparathyroidism of renal origin: Secondary | ICD-10-CM | POA: Diagnosis not present

## 2018-05-10 DIAGNOSIS — Z992 Dependence on renal dialysis: Secondary | ICD-10-CM | POA: Diagnosis not present

## 2018-05-10 DIAGNOSIS — D509 Iron deficiency anemia, unspecified: Secondary | ICD-10-CM | POA: Diagnosis not present

## 2018-05-10 MED ORDER — COLCHICINE 0.6 MG PO TABS: 0.6000 mg | ORAL_TABLET | Freq: Every day | ORAL | 1 refills | Status: DC | PRN

## 2018-05-11 DIAGNOSIS — N186 End stage renal disease: Secondary | ICD-10-CM | POA: Diagnosis not present

## 2018-05-11 DIAGNOSIS — Z992 Dependence on renal dialysis: Secondary | ICD-10-CM | POA: Diagnosis not present

## 2018-05-11 DIAGNOSIS — D631 Anemia in chronic kidney disease: Secondary | ICD-10-CM | POA: Diagnosis not present

## 2018-05-11 DIAGNOSIS — D509 Iron deficiency anemia, unspecified: Secondary | ICD-10-CM | POA: Diagnosis not present

## 2018-05-11 DIAGNOSIS — N2581 Secondary hyperparathyroidism of renal origin: Secondary | ICD-10-CM | POA: Diagnosis not present

## 2018-05-12 ENCOUNTER — Encounter (INDEPENDENT_AMBULATORY_CARE_PROVIDER_SITE_OTHER): Payer: Self-pay

## 2018-05-12 DIAGNOSIS — N186 End stage renal disease: Secondary | ICD-10-CM | POA: Diagnosis not present

## 2018-05-12 DIAGNOSIS — Z992 Dependence on renal dialysis: Secondary | ICD-10-CM | POA: Diagnosis not present

## 2018-05-12 DIAGNOSIS — D631 Anemia in chronic kidney disease: Secondary | ICD-10-CM | POA: Diagnosis not present

## 2018-05-12 DIAGNOSIS — D509 Iron deficiency anemia, unspecified: Secondary | ICD-10-CM | POA: Diagnosis not present

## 2018-05-13 DIAGNOSIS — Z992 Dependence on renal dialysis: Secondary | ICD-10-CM | POA: Diagnosis not present

## 2018-05-13 DIAGNOSIS — D631 Anemia in chronic kidney disease: Secondary | ICD-10-CM | POA: Diagnosis not present

## 2018-05-13 DIAGNOSIS — D509 Iron deficiency anemia, unspecified: Secondary | ICD-10-CM | POA: Diagnosis not present

## 2018-05-13 DIAGNOSIS — N186 End stage renal disease: Secondary | ICD-10-CM | POA: Diagnosis not present

## 2018-05-14 DIAGNOSIS — D631 Anemia in chronic kidney disease: Secondary | ICD-10-CM | POA: Diagnosis not present

## 2018-05-14 DIAGNOSIS — N186 End stage renal disease: Secondary | ICD-10-CM | POA: Diagnosis not present

## 2018-05-14 DIAGNOSIS — D509 Iron deficiency anemia, unspecified: Secondary | ICD-10-CM | POA: Diagnosis not present

## 2018-05-14 DIAGNOSIS — Z992 Dependence on renal dialysis: Secondary | ICD-10-CM | POA: Diagnosis not present

## 2018-05-15 DIAGNOSIS — Z992 Dependence on renal dialysis: Secondary | ICD-10-CM | POA: Diagnosis not present

## 2018-05-15 DIAGNOSIS — D631 Anemia in chronic kidney disease: Secondary | ICD-10-CM | POA: Diagnosis not present

## 2018-05-15 DIAGNOSIS — N186 End stage renal disease: Secondary | ICD-10-CM | POA: Diagnosis not present

## 2018-05-15 DIAGNOSIS — D509 Iron deficiency anemia, unspecified: Secondary | ICD-10-CM | POA: Diagnosis not present

## 2018-05-16 ENCOUNTER — Telehealth (INDEPENDENT_AMBULATORY_CARE_PROVIDER_SITE_OTHER): Payer: Self-pay

## 2018-05-16 ENCOUNTER — Telehealth (INDEPENDENT_AMBULATORY_CARE_PROVIDER_SITE_OTHER): Payer: Self-pay | Admitting: Vascular Surgery

## 2018-05-16 DIAGNOSIS — D509 Iron deficiency anemia, unspecified: Secondary | ICD-10-CM | POA: Diagnosis not present

## 2018-05-16 DIAGNOSIS — N186 End stage renal disease: Secondary | ICD-10-CM | POA: Diagnosis not present

## 2018-05-16 DIAGNOSIS — Z992 Dependence on renal dialysis: Secondary | ICD-10-CM | POA: Diagnosis not present

## 2018-05-16 DIAGNOSIS — D631 Anemia in chronic kidney disease: Secondary | ICD-10-CM | POA: Diagnosis not present

## 2018-05-16 NOTE — Telephone Encounter (Signed)
I spoke with the patient daughter regarding about the results and is she ready to move forward with scheduling av fistula

## 2018-05-16 NOTE — Telephone Encounter (Signed)
Please let Ms. Hearne's daughter know that based on the results of her vein mapping she should have an AV Fistula created in in left arm.  If she has further questions regarding the procedure and what it entails, we would be happy to schedule her an appointment with Dr. Delana Meyer or one of our APPs.

## 2018-05-17 DIAGNOSIS — D509 Iron deficiency anemia, unspecified: Secondary | ICD-10-CM | POA: Diagnosis not present

## 2018-05-17 DIAGNOSIS — Z992 Dependence on renal dialysis: Secondary | ICD-10-CM | POA: Diagnosis not present

## 2018-05-17 DIAGNOSIS — N186 End stage renal disease: Secondary | ICD-10-CM | POA: Diagnosis not present

## 2018-05-17 DIAGNOSIS — D631 Anemia in chronic kidney disease: Secondary | ICD-10-CM | POA: Diagnosis not present

## 2018-05-18 ENCOUNTER — Encounter (INDEPENDENT_AMBULATORY_CARE_PROVIDER_SITE_OTHER): Payer: Medicare Other

## 2018-05-18 ENCOUNTER — Ambulatory Visit (INDEPENDENT_AMBULATORY_CARE_PROVIDER_SITE_OTHER): Payer: Medicare Other | Admitting: Vascular Surgery

## 2018-05-18 DIAGNOSIS — D509 Iron deficiency anemia, unspecified: Secondary | ICD-10-CM | POA: Diagnosis not present

## 2018-05-18 DIAGNOSIS — Z992 Dependence on renal dialysis: Secondary | ICD-10-CM | POA: Diagnosis not present

## 2018-05-18 DIAGNOSIS — D631 Anemia in chronic kidney disease: Secondary | ICD-10-CM | POA: Diagnosis not present

## 2018-05-18 DIAGNOSIS — N186 End stage renal disease: Secondary | ICD-10-CM | POA: Diagnosis not present

## 2018-05-19 DIAGNOSIS — D631 Anemia in chronic kidney disease: Secondary | ICD-10-CM | POA: Diagnosis not present

## 2018-05-19 DIAGNOSIS — Z992 Dependence on renal dialysis: Secondary | ICD-10-CM | POA: Diagnosis not present

## 2018-05-19 DIAGNOSIS — N186 End stage renal disease: Secondary | ICD-10-CM | POA: Diagnosis not present

## 2018-05-19 DIAGNOSIS — D509 Iron deficiency anemia, unspecified: Secondary | ICD-10-CM | POA: Diagnosis not present

## 2018-05-20 DIAGNOSIS — D509 Iron deficiency anemia, unspecified: Secondary | ICD-10-CM | POA: Diagnosis not present

## 2018-05-20 DIAGNOSIS — Z992 Dependence on renal dialysis: Secondary | ICD-10-CM | POA: Diagnosis not present

## 2018-05-20 DIAGNOSIS — N186 End stage renal disease: Secondary | ICD-10-CM | POA: Diagnosis not present

## 2018-05-20 DIAGNOSIS — D631 Anemia in chronic kidney disease: Secondary | ICD-10-CM | POA: Diagnosis not present

## 2018-05-21 DIAGNOSIS — D509 Iron deficiency anemia, unspecified: Secondary | ICD-10-CM | POA: Diagnosis not present

## 2018-05-21 DIAGNOSIS — D631 Anemia in chronic kidney disease: Secondary | ICD-10-CM | POA: Diagnosis not present

## 2018-05-21 DIAGNOSIS — Z992 Dependence on renal dialysis: Secondary | ICD-10-CM | POA: Diagnosis not present

## 2018-05-21 DIAGNOSIS — N186 End stage renal disease: Secondary | ICD-10-CM | POA: Diagnosis not present

## 2018-05-22 DIAGNOSIS — Z992 Dependence on renal dialysis: Secondary | ICD-10-CM | POA: Diagnosis not present

## 2018-05-22 DIAGNOSIS — D509 Iron deficiency anemia, unspecified: Secondary | ICD-10-CM | POA: Diagnosis not present

## 2018-05-22 DIAGNOSIS — N186 End stage renal disease: Secondary | ICD-10-CM | POA: Diagnosis not present

## 2018-05-22 DIAGNOSIS — D631 Anemia in chronic kidney disease: Secondary | ICD-10-CM | POA: Diagnosis not present

## 2018-05-23 DIAGNOSIS — N186 End stage renal disease: Secondary | ICD-10-CM | POA: Diagnosis not present

## 2018-05-23 DIAGNOSIS — D509 Iron deficiency anemia, unspecified: Secondary | ICD-10-CM | POA: Diagnosis not present

## 2018-05-23 DIAGNOSIS — D631 Anemia in chronic kidney disease: Secondary | ICD-10-CM | POA: Diagnosis not present

## 2018-05-23 DIAGNOSIS — Z992 Dependence on renal dialysis: Secondary | ICD-10-CM | POA: Diagnosis not present

## 2018-05-24 DIAGNOSIS — N186 End stage renal disease: Secondary | ICD-10-CM | POA: Diagnosis not present

## 2018-05-24 DIAGNOSIS — Z992 Dependence on renal dialysis: Secondary | ICD-10-CM | POA: Diagnosis not present

## 2018-05-24 DIAGNOSIS — D631 Anemia in chronic kidney disease: Secondary | ICD-10-CM | POA: Diagnosis not present

## 2018-05-24 DIAGNOSIS — D509 Iron deficiency anemia, unspecified: Secondary | ICD-10-CM | POA: Diagnosis not present

## 2018-05-25 DIAGNOSIS — D631 Anemia in chronic kidney disease: Secondary | ICD-10-CM | POA: Diagnosis not present

## 2018-05-25 DIAGNOSIS — N186 End stage renal disease: Secondary | ICD-10-CM | POA: Diagnosis not present

## 2018-05-25 DIAGNOSIS — Z992 Dependence on renal dialysis: Secondary | ICD-10-CM | POA: Diagnosis not present

## 2018-05-25 DIAGNOSIS — D509 Iron deficiency anemia, unspecified: Secondary | ICD-10-CM | POA: Diagnosis not present

## 2018-05-26 DIAGNOSIS — Z992 Dependence on renal dialysis: Secondary | ICD-10-CM | POA: Diagnosis not present

## 2018-05-26 DIAGNOSIS — N186 End stage renal disease: Secondary | ICD-10-CM | POA: Diagnosis not present

## 2018-05-26 DIAGNOSIS — D631 Anemia in chronic kidney disease: Secondary | ICD-10-CM | POA: Diagnosis not present

## 2018-05-26 DIAGNOSIS — D509 Iron deficiency anemia, unspecified: Secondary | ICD-10-CM | POA: Diagnosis not present

## 2018-05-27 DIAGNOSIS — D509 Iron deficiency anemia, unspecified: Secondary | ICD-10-CM | POA: Diagnosis not present

## 2018-05-27 DIAGNOSIS — N186 End stage renal disease: Secondary | ICD-10-CM | POA: Diagnosis not present

## 2018-05-27 DIAGNOSIS — D631 Anemia in chronic kidney disease: Secondary | ICD-10-CM | POA: Diagnosis not present

## 2018-05-27 DIAGNOSIS — Z992 Dependence on renal dialysis: Secondary | ICD-10-CM | POA: Diagnosis not present

## 2018-05-28 DIAGNOSIS — Z992 Dependence on renal dialysis: Secondary | ICD-10-CM | POA: Diagnosis not present

## 2018-05-28 DIAGNOSIS — D509 Iron deficiency anemia, unspecified: Secondary | ICD-10-CM | POA: Diagnosis not present

## 2018-05-28 DIAGNOSIS — N186 End stage renal disease: Secondary | ICD-10-CM | POA: Diagnosis not present

## 2018-05-28 DIAGNOSIS — D631 Anemia in chronic kidney disease: Secondary | ICD-10-CM | POA: Diagnosis not present

## 2018-05-29 DIAGNOSIS — D631 Anemia in chronic kidney disease: Secondary | ICD-10-CM | POA: Diagnosis not present

## 2018-05-29 DIAGNOSIS — D509 Iron deficiency anemia, unspecified: Secondary | ICD-10-CM | POA: Diagnosis not present

## 2018-05-29 DIAGNOSIS — Z992 Dependence on renal dialysis: Secondary | ICD-10-CM | POA: Diagnosis not present

## 2018-05-29 DIAGNOSIS — N186 End stage renal disease: Secondary | ICD-10-CM | POA: Diagnosis not present

## 2018-05-30 DIAGNOSIS — Z992 Dependence on renal dialysis: Secondary | ICD-10-CM | POA: Diagnosis not present

## 2018-05-30 DIAGNOSIS — D509 Iron deficiency anemia, unspecified: Secondary | ICD-10-CM | POA: Diagnosis not present

## 2018-05-30 DIAGNOSIS — D631 Anemia in chronic kidney disease: Secondary | ICD-10-CM | POA: Diagnosis not present

## 2018-05-30 DIAGNOSIS — N186 End stage renal disease: Secondary | ICD-10-CM | POA: Diagnosis not present

## 2018-05-31 DIAGNOSIS — Z992 Dependence on renal dialysis: Secondary | ICD-10-CM | POA: Diagnosis not present

## 2018-05-31 DIAGNOSIS — D509 Iron deficiency anemia, unspecified: Secondary | ICD-10-CM | POA: Diagnosis not present

## 2018-05-31 DIAGNOSIS — N186 End stage renal disease: Secondary | ICD-10-CM | POA: Diagnosis not present

## 2018-05-31 DIAGNOSIS — D631 Anemia in chronic kidney disease: Secondary | ICD-10-CM | POA: Diagnosis not present

## 2018-06-01 DIAGNOSIS — Z992 Dependence on renal dialysis: Secondary | ICD-10-CM | POA: Diagnosis not present

## 2018-06-01 DIAGNOSIS — N186 End stage renal disease: Secondary | ICD-10-CM | POA: Diagnosis not present

## 2018-06-01 DIAGNOSIS — D631 Anemia in chronic kidney disease: Secondary | ICD-10-CM | POA: Diagnosis not present

## 2018-06-01 DIAGNOSIS — D509 Iron deficiency anemia, unspecified: Secondary | ICD-10-CM | POA: Diagnosis not present

## 2018-06-02 DIAGNOSIS — D631 Anemia in chronic kidney disease: Secondary | ICD-10-CM | POA: Diagnosis not present

## 2018-06-02 DIAGNOSIS — D509 Iron deficiency anemia, unspecified: Secondary | ICD-10-CM | POA: Diagnosis not present

## 2018-06-02 DIAGNOSIS — Z992 Dependence on renal dialysis: Secondary | ICD-10-CM | POA: Diagnosis not present

## 2018-06-02 DIAGNOSIS — N186 End stage renal disease: Secondary | ICD-10-CM | POA: Diagnosis not present

## 2018-06-03 DIAGNOSIS — D631 Anemia in chronic kidney disease: Secondary | ICD-10-CM | POA: Diagnosis not present

## 2018-06-03 DIAGNOSIS — D509 Iron deficiency anemia, unspecified: Secondary | ICD-10-CM | POA: Diagnosis not present

## 2018-06-03 DIAGNOSIS — Z992 Dependence on renal dialysis: Secondary | ICD-10-CM | POA: Diagnosis not present

## 2018-06-03 DIAGNOSIS — N186 End stage renal disease: Secondary | ICD-10-CM | POA: Diagnosis not present

## 2018-06-04 DIAGNOSIS — D509 Iron deficiency anemia, unspecified: Secondary | ICD-10-CM | POA: Diagnosis not present

## 2018-06-04 DIAGNOSIS — D631 Anemia in chronic kidney disease: Secondary | ICD-10-CM | POA: Diagnosis not present

## 2018-06-04 DIAGNOSIS — N186 End stage renal disease: Secondary | ICD-10-CM | POA: Diagnosis not present

## 2018-06-04 DIAGNOSIS — Z992 Dependence on renal dialysis: Secondary | ICD-10-CM | POA: Diagnosis not present

## 2018-06-05 DIAGNOSIS — Z992 Dependence on renal dialysis: Secondary | ICD-10-CM | POA: Diagnosis not present

## 2018-06-05 DIAGNOSIS — N186 End stage renal disease: Secondary | ICD-10-CM | POA: Diagnosis not present

## 2018-06-05 DIAGNOSIS — D631 Anemia in chronic kidney disease: Secondary | ICD-10-CM | POA: Diagnosis not present

## 2018-06-05 DIAGNOSIS — D509 Iron deficiency anemia, unspecified: Secondary | ICD-10-CM | POA: Diagnosis not present

## 2018-06-06 DIAGNOSIS — Z992 Dependence on renal dialysis: Secondary | ICD-10-CM | POA: Diagnosis not present

## 2018-06-06 DIAGNOSIS — D631 Anemia in chronic kidney disease: Secondary | ICD-10-CM | POA: Diagnosis not present

## 2018-06-06 DIAGNOSIS — D509 Iron deficiency anemia, unspecified: Secondary | ICD-10-CM | POA: Diagnosis not present

## 2018-06-06 DIAGNOSIS — N186 End stage renal disease: Secondary | ICD-10-CM | POA: Diagnosis not present

## 2018-06-07 DIAGNOSIS — N186 End stage renal disease: Secondary | ICD-10-CM | POA: Diagnosis not present

## 2018-06-07 DIAGNOSIS — D631 Anemia in chronic kidney disease: Secondary | ICD-10-CM | POA: Diagnosis not present

## 2018-06-07 DIAGNOSIS — D509 Iron deficiency anemia, unspecified: Secondary | ICD-10-CM | POA: Diagnosis not present

## 2018-06-07 DIAGNOSIS — Z992 Dependence on renal dialysis: Secondary | ICD-10-CM | POA: Diagnosis not present

## 2018-06-08 DIAGNOSIS — Z992 Dependence on renal dialysis: Secondary | ICD-10-CM | POA: Diagnosis not present

## 2018-06-08 DIAGNOSIS — D631 Anemia in chronic kidney disease: Secondary | ICD-10-CM | POA: Diagnosis not present

## 2018-06-08 DIAGNOSIS — N186 End stage renal disease: Secondary | ICD-10-CM | POA: Diagnosis not present

## 2018-06-08 DIAGNOSIS — D509 Iron deficiency anemia, unspecified: Secondary | ICD-10-CM | POA: Diagnosis not present

## 2018-06-09 DIAGNOSIS — Z992 Dependence on renal dialysis: Secondary | ICD-10-CM | POA: Diagnosis not present

## 2018-06-09 DIAGNOSIS — D631 Anemia in chronic kidney disease: Secondary | ICD-10-CM | POA: Diagnosis not present

## 2018-06-09 DIAGNOSIS — D509 Iron deficiency anemia, unspecified: Secondary | ICD-10-CM | POA: Diagnosis not present

## 2018-06-09 DIAGNOSIS — N186 End stage renal disease: Secondary | ICD-10-CM | POA: Diagnosis not present

## 2018-06-10 DIAGNOSIS — D631 Anemia in chronic kidney disease: Secondary | ICD-10-CM | POA: Diagnosis not present

## 2018-06-10 DIAGNOSIS — Z992 Dependence on renal dialysis: Secondary | ICD-10-CM | POA: Diagnosis not present

## 2018-06-10 DIAGNOSIS — N186 End stage renal disease: Secondary | ICD-10-CM | POA: Diagnosis not present

## 2018-06-10 DIAGNOSIS — D509 Iron deficiency anemia, unspecified: Secondary | ICD-10-CM | POA: Diagnosis not present

## 2018-06-11 DIAGNOSIS — D509 Iron deficiency anemia, unspecified: Secondary | ICD-10-CM | POA: Diagnosis not present

## 2018-06-11 DIAGNOSIS — D631 Anemia in chronic kidney disease: Secondary | ICD-10-CM | POA: Diagnosis not present

## 2018-06-11 DIAGNOSIS — N186 End stage renal disease: Secondary | ICD-10-CM | POA: Diagnosis not present

## 2018-06-11 DIAGNOSIS — Z992 Dependence on renal dialysis: Secondary | ICD-10-CM | POA: Diagnosis not present

## 2018-06-12 ENCOUNTER — Encounter (INDEPENDENT_AMBULATORY_CARE_PROVIDER_SITE_OTHER): Payer: Self-pay | Admitting: Vascular Surgery

## 2018-06-12 ENCOUNTER — Ambulatory Visit (INDEPENDENT_AMBULATORY_CARE_PROVIDER_SITE_OTHER): Payer: Medicare Other | Admitting: Vascular Surgery

## 2018-06-12 VITALS — BP 142/83 | HR 102 | Resp 16 | Wt 199.2 lb

## 2018-06-12 DIAGNOSIS — E785 Hyperlipidemia, unspecified: Secondary | ICD-10-CM | POA: Diagnosis not present

## 2018-06-12 DIAGNOSIS — E1122 Type 2 diabetes mellitus with diabetic chronic kidney disease: Secondary | ICD-10-CM | POA: Diagnosis not present

## 2018-06-12 DIAGNOSIS — I10 Essential (primary) hypertension: Secondary | ICD-10-CM

## 2018-06-12 DIAGNOSIS — Z992 Dependence on renal dialysis: Secondary | ICD-10-CM | POA: Diagnosis not present

## 2018-06-12 DIAGNOSIS — D631 Anemia in chronic kidney disease: Secondary | ICD-10-CM | POA: Diagnosis not present

## 2018-06-12 DIAGNOSIS — N186 End stage renal disease: Secondary | ICD-10-CM | POA: Diagnosis not present

## 2018-06-12 DIAGNOSIS — D509 Iron deficiency anemia, unspecified: Secondary | ICD-10-CM | POA: Diagnosis not present

## 2018-06-12 NOTE — Progress Notes (Signed)
MRN : [id number]  Stacy Pham is a 84 y.o. (1931-03-14) female who presents with chief complaint of  Chief Complaint  Patient presents with  . Follow-up    discuss ultrasoiund results  .  History of Present Illness:  The patient returns to the office for follow up regarding problem with the dialysis access. Currently the patient is maintained via a catheter.    The patient has chronic shoulder and elbow pain on the left is been going on for many years and even though she is right-handed feels that a right sided access would be better.  The patient denies redness or swelling at the access site. The patient denies fever or chills at home or while on dialysis.  The patient denies amaurosis fugax or recent TIA symptoms. There are no recent neurological changes noted. The patient denies claudication symptoms or rest pain symptoms. The patient denies history of DVT, PE or superficial thrombophlebitis.  The patient denies recent episodes of angina or shortness of breath.   Vein mapping bilateral upper extremities demonstrates a 3.2 mm cephalic vein at the antecubital fossa bilaterally.  Current Meds  Medication Sig  . acetaminophen (TYLENOL 8 HOUR ARTHRITIS PAIN) 650 MG CR tablet Take 650 mg every 8 (eight) hours as needed by mouth for pain.  [name] allopurinol (ZYLOPRIM) 100 MG tablet TAKE 1 TABLET BY MOUTH ONCE DAILY  . apixaban (ELIQUIS) 2.5 MG TABS tablet Take 1 tablet (2.5 mg total) by mouth 2 (two) times daily.  [name] atorvastatin (LIPITOR) 20 MG tablet Take 1 tablet (20 mg total) by mouth every evening.  . blood glucose meter kit and supplies KIT Please dispense either One Touch or Bayer Contour She must have one of these machines since she is on peritoneal dialysis. Use up to four times daily as directed. (FOR ICD-9 250.00, 250.01).  . carvedilol (COREG) 6.25 MG tablet Take 1 tablet (6.25 mg total) by mouth 2 (two) times daily.  . cinacalcet (SENSIPAR) 30 MG tablet Take 1 tablet (30  mg total) by mouth daily with supper. (Patient taking differently: Take 30 mg by mouth daily. With largest meal)  . colchicine 0.6 MG tablet Take 1 tablet (0.6 mg total) by mouth daily as needed (for gout flare ups).  . ferric citrate (AURYXIA) 1 GM 210 MG(Fe) tablet Take 210 mg by mouth 3 (three) times daily with meals.  . fluticasone (FLONASE) 50 MCG/ACT nasal spray Place 1 spray into both nostrils daily as needed for allergies or rhinitis.  [name] gabapentin (NEURONTIN) 100 MG capsule Take 1 capsule (100 mg total) by mouth 3 (three) times daily.  [name] glucose blood (ONETOUCH VERIO) test strip Test blood sugar four times daily. Dx 250.00, 250.01, E11.22, N18.6  . hydroxypropyl methylcellulose / hypromellose (ISOPTO TEARS / GONIOVISC) 2.5 % ophthalmic solution Place 1-2 drops 3 (three) times daily as needed into both eyes for dry eyes.  [name] nystatin cream (MYCOSTATIN) Apply 1 application topically 2 (two) times daily. For up to 10 days.  Glory Rosebush DELICA LANCETS 09Q MISC Test blood sugar four times daily. Dx 250.00, 250.01, E11.22, N18.6  . triamcinolone (KENALOG) 0.025 % ointment Apply 1 application topically 2 (two) times daily. For up to 10 days    Past Medical History:  Diagnosis Date  . Atrial fibrillation ([place])   . Cataract   . Diabetes mellitus without complication ([place])   . Dialysis patient ([place])    2/19- changed from hemodialysis to nightly peritoneal  . Hypertension   .  Renal failure     Past Surgical History:  Procedure Laterality Date  . CAPD INSERTION N/A 06/08/2017   Procedure: LAPAROSCOPIC INSERTION CONTINUOUS AMBULATORY PERITONEAL DIALYSIS  (CAPD) CATHETER;  Surgeon: Katha Cabal, MD;  Location: [facility];  Service: Vascular;  Laterality: N/A;  . CATARACT EXTRACTION, BILATERAL Bilateral   . DIALYSIS/PERMA CATHETER INSERTION N/A 03/18/2017   Procedure: DIALYSIS/PERMA CATHETER INSERTION;  Surgeon: Katha Cabal, MD;  Location: [place] [facility];  Service: Cardiovascular;   Laterality: N/A;  . DIALYSIS/PERMA CATHETER REMOVAL N/A 10/20/2017   Procedure: DIALYSIS/PERMA CATHETER REMOVAL;  Surgeon: Algernon Huxley, MD;  Location: [place] [facility];  Service: Cardiovascular;  Laterality: N/A;    Social History Social History   Tobacco Use  . Smoking status: Never Smoker  . Smokeless tobacco: Never Used  Substance Use Topics  . Alcohol use: No  . Drug use: No    Family History Family History  Problem Relation Age of Onset  . Hypertension Father     No Known Allergies   REVIEW OF SYSTEMS (Negative unless checked)  Constitutional: _0 Weight loss  _1 Fever  _2 Chills Cardiac: _3 Chest pain   _4 Chest pressure   _5 Palpitations   _6 Shortness of breath when laying flat   _7 Shortness of breath with exertion. Vascular:  _8 Pain in legs with walking   _9 Pain in legs at rest  _10 History of DVT   _11 Phlebitis   _12 Swelling in legs   _13 Varicose veins   _14 Non-healing ulcers Pulmonary:   _15 Uses home oxygen   _16 Productive cough   _17 Hemoptysis   _18 Wheeze  _19 COPD   _20 Asthma Neurologic:  _21 Dizziness   _22 Seizures   _23 History of stroke   _24 History of TIA  _25 Aphasia   _26 Vissual changes   _27 Weakness or numbness in arm   _28 Weakness or numbness in leg Musculoskeletal:   _29 Joint swelling   _30 Joint pain   _31 Low back pain Hematologic:  _32 Easy bruising  _33 Easy bleeding   _34 Hypercoagulable state   _35 Anemic Gastrointestinal:  _36 Diarrhea   _37 Vomiting  _38 Gastroesophageal reflux/heartburn   _39 Difficulty swallowing. Genitourinary:  _40 Chronic kidney disease   _41 Difficult urination  _42 Frequent urination   _43 Blood in urine Skin:  _44 Rashes   _45 Ulcers  Psychological:  _46 History of anxiety   _47  History of major depression.  Physical Examination  Vitals:   06/12/18 1107  BP: (!) 142/83  Pulse: (!) 102  Resp: 16  Weight: 199 lb 3.2 oz (90.4 kg)   Body mass index is 36.43 kg/m. Gen: WD/WN, NAD Head: [place]/AT, No temporalis wasting.  Ear/Nose/Throat: Hearing grossly intact, nares w/o erythema  or drainage Eyes: PER, EOMI, sclera nonicteric.  Neck: Supple, no large masses.   Pulmonary:  Good air movement, no audible wheezing bilaterally, no use of accessory muscles.  Cardiac: RRR, no JVD Vascular:  Tunneled catheter CD&I Vessel Right Left  Radial Palpable Palpable  Ulnar Palpable Palpable  Brachial Palpable Palpable  Gastrointestinal: Non-distended. No guarding/no peritoneal signs.  Musculoskeletal: M/S 5/5 throughout.  No deformity or atrophy.  Neurologic: CN 2-12 intact. Symmetrical.  Speech is fluent. Motor exam as listed above. Psychiatric: Judgment intact, Mood & affect appropriate for pt's clinical situation. Dermatologic: No rashes or ulcers noted.  No changes consistent with cellulitis. Lymph : No lichenification or skin changes of chronic lymphedema.  CBC Lab Results  Component Value Date   WBC 10.2 02/21/2018   HGB 11.5 (L) 02/21/2018   HCT 33.7 (L) 02/21/2018   MCV 92.4 02/21/2018   PLT 252 02/21/2018    BMET  Component Value Date/Time   NA 135 03/17/2018 1103   K 4.4 03/17/2018 1103   CL 92 (L) 03/17/2018 1103   CO2 30 03/17/2018 1103   GLUCOSE 111 (H) 03/17/2018 1103   BUN 51 (H) 03/17/2018 1103   CREATININE 10.23 (HH) 03/17/2018 1103   CALCIUM 8.9 03/17/2018 1103   GFRNONAA 3 (L) 02/21/2018 1220   GFRAA 3 (L) 02/21/2018 1220   CrCl cannot be calculated (Patient's most recent lab result is older than the maximum 21 days allowed.).  COAG Lab Results  Component Value Date   INR 1.08 06/08/2017   INR 1.09 04/22/2017   INR 1.22 03/18/2017    Radiology No results found.   Assessment/Plan 1. ESRD (end stage renal disease) ([place]) Recommend:  At this time the patient does not have appropriate extremity access for dialysis  Patient should have a right brachial cephalic fistula created.  Possibility of a graft creation was also discussed.  The risks, benefits and alternative therapies were reviewed in detail with the patient.  All questions  were answered.  The patient agrees to proceed with surgery.    2. Hyperlipidemia, unspecified hyperlipidemia type Continue statin as ordered and reviewed, no changes at this time   3. Type 2 diabetes mellitus with chronic kidney disease on chronic dialysis, without long-term current use of insulin (HCC) Continue hypoglycemic medications as already ordered, these medications have been reviewed and there are no changes at this time.  Hgb A1C to be monitored as already arranged by primary service   4. Hypertension, unspecified type Continue antihypertensive medications as already ordered, these medications have been reviewed and there are no changes at this time.    Hortencia Pilar, MD  06/12/2018 11:19 AM

## 2018-06-13 DIAGNOSIS — N186 End stage renal disease: Secondary | ICD-10-CM | POA: Diagnosis not present

## 2018-06-13 DIAGNOSIS — Z992 Dependence on renal dialysis: Secondary | ICD-10-CM | POA: Diagnosis not present

## 2018-06-13 DIAGNOSIS — D509 Iron deficiency anemia, unspecified: Secondary | ICD-10-CM | POA: Diagnosis not present

## 2018-06-13 DIAGNOSIS — D631 Anemia in chronic kidney disease: Secondary | ICD-10-CM | POA: Diagnosis not present

## 2018-06-14 DIAGNOSIS — Z992 Dependence on renal dialysis: Secondary | ICD-10-CM | POA: Diagnosis not present

## 2018-06-14 DIAGNOSIS — N186 End stage renal disease: Secondary | ICD-10-CM | POA: Diagnosis not present

## 2018-06-14 DIAGNOSIS — D509 Iron deficiency anemia, unspecified: Secondary | ICD-10-CM | POA: Diagnosis not present

## 2018-06-14 DIAGNOSIS — D631 Anemia in chronic kidney disease: Secondary | ICD-10-CM | POA: Diagnosis not present

## 2018-06-15 ENCOUNTER — Other Ambulatory Visit (INDEPENDENT_AMBULATORY_CARE_PROVIDER_SITE_OTHER): Payer: Self-pay | Admitting: Nurse Practitioner

## 2018-06-15 DIAGNOSIS — N186 End stage renal disease: Secondary | ICD-10-CM | POA: Diagnosis not present

## 2018-06-15 DIAGNOSIS — Z992 Dependence on renal dialysis: Secondary | ICD-10-CM | POA: Diagnosis not present

## 2018-06-15 DIAGNOSIS — D509 Iron deficiency anemia, unspecified: Secondary | ICD-10-CM | POA: Diagnosis not present

## 2018-06-15 DIAGNOSIS — D631 Anemia in chronic kidney disease: Secondary | ICD-10-CM | POA: Diagnosis not present

## 2018-06-16 ENCOUNTER — Other Ambulatory Visit: Payer: Self-pay

## 2018-06-16 ENCOUNTER — Encounter
Admission: RE | Admit: 2018-06-16 | Discharge: 2018-06-16 | Disposition: A | Discharge status: Home / Self Care | Payer: Medicare Other | Source: Ambulatory Visit | Attending: Vascular Surgery | Admitting: Vascular Surgery

## 2018-06-16 DIAGNOSIS — Z01818 Encounter for other preprocedural examination: Secondary | ICD-10-CM | POA: Diagnosis not present

## 2018-06-16 DIAGNOSIS — Z01810 Encounter for preprocedural cardiovascular examination: Secondary | ICD-10-CM | POA: Diagnosis not present

## 2018-06-16 DIAGNOSIS — Z992 Dependence on renal dialysis: Secondary | ICD-10-CM | POA: Diagnosis not present

## 2018-06-16 DIAGNOSIS — R9431 Abnormal electrocardiogram [ECG] [EKG]: Secondary | ICD-10-CM | POA: Diagnosis not present

## 2018-06-16 DIAGNOSIS — N186 End stage renal disease: Secondary | ICD-10-CM | POA: Diagnosis not present

## 2018-06-16 DIAGNOSIS — D509 Iron deficiency anemia, unspecified: Secondary | ICD-10-CM | POA: Diagnosis not present

## 2018-06-16 DIAGNOSIS — D631 Anemia in chronic kidney disease: Secondary | ICD-10-CM | POA: Diagnosis not present

## 2018-06-16 HISTORY — DX: Gout, unspecified: M10.9

## 2018-06-16 HISTORY — DX: Cardiac arrhythmia, unspecified: I49.9

## 2018-06-16 HISTORY — DX: Unspecified osteoarthritis, unspecified site: M19.90

## 2018-06-16 HISTORY — DX: Dorsalgia, unspecified: M54.9

## 2018-06-16 LAB — CBC WITH DIFFERENTIAL/PLATELET
Abs Immature Granulocytes: 0.06 10*3/uL (ref 0.00–0.07)
BASOS PCT: 1 %
Basophils Absolute: 0.1 10*3/uL (ref 0.0–0.1)
Eosinophils Absolute: 0.1 10*3/uL (ref 0.0–0.5)
Eosinophils Relative: 1 %
HCT: 37.7 % (ref 36.0–46.0)
Hemoglobin: 11.8 g/dL — ABNORMAL LOW (ref 12.0–15.0)
Immature Granulocytes: 1 %
Lymphocytes Relative: 21 %
Lymphs Abs: 2 10*3/uL (ref 0.7–4.0)
MCH: 32.3 pg (ref 26.0–34.0)
MCHC: 31.3 g/dL (ref 30.0–36.0)
MCV: 103.3 fL — ABNORMAL HIGH (ref 80.0–100.0)
Monocytes Absolute: 0.9 10*3/uL (ref 0.1–1.0)
Monocytes Relative: 10 %
Neutro Abs: 6.4 10*3/uL (ref 1.7–7.7)
Neutrophils Relative %: 66 %
Platelets: 309 10*3/uL (ref 150–400)
RBC: 3.65 MIL/uL — ABNORMAL LOW (ref 3.87–5.11)
RDW: 14.7 % (ref 11.5–15.5)
WBC: 9.5 10*3/uL (ref 4.0–10.5)
nRBC: 0.2 % (ref 0.0–0.2)

## 2018-06-16 LAB — BASIC METABOLIC PANEL
Anion gap: 18 — ABNORMAL HIGH (ref 5–15)
BUN: 58 mg/dL — ABNORMAL HIGH (ref 8–23)
CO2: 23 mmol/L (ref 22–32)
Calcium: 8.9 mg/dL (ref 8.9–10.3)
Chloride: 92 mmol/L — ABNORMAL LOW (ref 98–111)
Creatinine, Ser: 13.28 mg/dL — ABNORMAL HIGH (ref 0.44–1.00)
GFR calc Af Amer: 3 mL/min — ABNORMAL LOW (ref >60)
GFR, EST NON AFRICAN AMERICAN: 2 mL/min — AB (ref >60)
Glucose, Bld: 135 mg/dL — ABNORMAL HIGH (ref 70–99)
Potassium: 4 mmol/L (ref 3.5–5.1)
Sodium: 133 mmol/L — ABNORMAL LOW (ref 135–145)

## 2018-06-16 LAB — PROTIME-INR
INR: 1.43
Prothrombin Time: 17.3 seconds — ABNORMAL HIGH (ref 11.4–15.2)

## 2018-06-16 LAB — TYPE AND SCREEN
ABO/RH(D): B POS
Antibody Screen: NEGATIVE

## 2018-06-16 LAB — APTT: APTT: 37 s — AB (ref 24–36)

## 2018-06-16 NOTE — Patient Instructions (Signed)
Your procedure is scheduled on: 06/30/18 Fri Report to Same Day Surgery 2nd floor medical mall [facility] Entrance-take elevator on left to 2nd floor.  Check in with surgery information desk.) To find out your arrival time please call [phone] between 1PM - 3PM on 06/29/18 Thurs  Remember: Instructions that are not followed completely may result in serious medical risk, up to and including death, or upon the discretion of your surgeon and anesthesiologist your surgery may need to be rescheduled.    _x___ 1. Do not eat food after midnight the night before your procedure. You may drink clear liquids up to 2 hours before you are scheduled to arrive at the hospital for your procedure.  Do not drink clear liquids within 2 hours of your scheduled arrival to the hospital.  Clear liquids include  --Water or Apple juice without pulp  --Clear carbohydrate beverage such as ClearFast or Gatorade  --Black Coffee or Clear Tea (No milk, no creamers, do not add anything to                  the coffee or Tea Type 1 and type 2 diabetics should only drink water.   ____Ensure clear carbohydrate drink on the way to the hospital for bariatric patients  ____Ensure clear carbohydrate drink 3 hours before surgery for Dr Dwyane Luo patients if physician instructed.   No gum chewing or hard candies.     __x__ 2. No Alcohol for 24 hours before or after surgery.   __x__3. No Smoking or e-cigarettes for 24 prior to surgery.  Do not use any chewable tobacco products for at least 6 hour prior to surgery   ____  4. Bring all medications with you on the day of surgery if instructed.    __x__ 5. Notify your doctor if there is any change in your medical condition     (cold, fever, infections).    x___6. On the morning of surgery brush your teeth with toothpaste and water.  You may rinse your mouth with mouth wash if you wish.  Do not swallow any toothpaste or mouthwash.   Do not wear jewelry, make-up, hairpins,  clips or nail polish.  Do not wear lotions, powders, or perfumes. You may wear deodorant.  Do not shave 48 hours prior to surgery. Men may shave face and neck.  Do not bring valuables to the hospital.    [facility] is not responsible for any belongings or valuables.               Contacts, dentures or bridgework may not be worn into surgery.  Leave your suitcase in the car. After surgery it may be brought to your room.  For patients admitted to the hospital, discharge time is determined by your                       treatment team.  _  Patients discharged the day of surgery will not be allowed to drive home.  You will need someone to drive you home and stay with you the night of your procedure.    Please read over the following fact sheets that you were given:   [facility] Preparing for Surgery and or MRSA Information   _x___ Take anti-hypertensive listed below, cardiac, seizure, asthma,     anti-reflux and psychiatric medicines. These include:  1. carvedilol (COREG) 6.25 MG   2.gabapentin (NEURONTIN) 100 MG capsule  3.  4.  5.  6.  ____Fleets enema or Magnesium Citrate as directed.   _x___ Use CHG Soap or sage wipes as directed on instruction sheet   ____ Use inhalers on the day of surgery and bring to hospital day of surgery  ____ Stop Metformin and Janumet 2 days prior to surgery.    ____ Take 1/2 of usual insulin dose the night before surgery and none on the morning     surgery.   _x___ Follow recommendations from Cardiologist, Pulmonologist or PCP regarding          stopping Aspirin, Coumadin, Plavix ,Eliquis, Effient, or Pradaxa, and Pletal.  X____Stop Anti-inflammatories such as Advil, Aleve, Ibuprofen, Motrin, Naproxen, Naprosyn, Goodies powders or aspirin products. OK to take Tylenol and                          Celebrex.   _x___ Stop supplements until after surgery.  But may continue Vitamin D, Vitamin B,       and multivitamin.   ____ Bring C-Pap to the hospital.

## 2018-06-17 DIAGNOSIS — D509 Iron deficiency anemia, unspecified: Secondary | ICD-10-CM | POA: Diagnosis not present

## 2018-06-17 DIAGNOSIS — D631 Anemia in chronic kidney disease: Secondary | ICD-10-CM | POA: Diagnosis not present

## 2018-06-17 DIAGNOSIS — N186 End stage renal disease: Secondary | ICD-10-CM | POA: Diagnosis not present

## 2018-06-17 DIAGNOSIS — Z992 Dependence on renal dialysis: Secondary | ICD-10-CM | POA: Diagnosis not present

## 2018-06-18 DIAGNOSIS — D631 Anemia in chronic kidney disease: Secondary | ICD-10-CM | POA: Diagnosis not present

## 2018-06-18 DIAGNOSIS — Z992 Dependence on renal dialysis: Secondary | ICD-10-CM | POA: Diagnosis not present

## 2018-06-18 DIAGNOSIS — N186 End stage renal disease: Secondary | ICD-10-CM | POA: Diagnosis not present

## 2018-06-18 DIAGNOSIS — D509 Iron deficiency anemia, unspecified: Secondary | ICD-10-CM | POA: Diagnosis not present

## 2018-06-19 ENCOUNTER — Telehealth: Payer: Self-pay | Admitting: Internal Medicine

## 2018-06-19 DIAGNOSIS — Z992 Dependence on renal dialysis: Secondary | ICD-10-CM | POA: Diagnosis not present

## 2018-06-19 DIAGNOSIS — D509 Iron deficiency anemia, unspecified: Secondary | ICD-10-CM | POA: Diagnosis not present

## 2018-06-19 DIAGNOSIS — N186 End stage renal disease: Secondary | ICD-10-CM | POA: Diagnosis not present

## 2018-06-19 DIAGNOSIS — D631 Anemia in chronic kidney disease: Secondary | ICD-10-CM | POA: Diagnosis not present

## 2018-06-19 NOTE — Telephone Encounter (Signed)
Placed in Dr Darnelle Bos basket for completion.

## 2018-06-19 NOTE — Pre-Procedure Instructions (Signed)
LABS FAXED TO DR [facility]

## 2018-06-19 NOTE — Telephone Encounter (Signed)
A lady from HR brought by patients [employer] assistants form to be completed States that the patient came on Friday but we were out of the office, gave to [place] in nurse box

## 2018-06-20 DIAGNOSIS — Z992 Dependence on renal dialysis: Secondary | ICD-10-CM | POA: Diagnosis not present

## 2018-06-20 DIAGNOSIS — D509 Iron deficiency anemia, unspecified: Secondary | ICD-10-CM | POA: Diagnosis not present

## 2018-06-20 DIAGNOSIS — D631 Anemia in chronic kidney disease: Secondary | ICD-10-CM | POA: Diagnosis not present

## 2018-06-20 DIAGNOSIS — N186 End stage renal disease: Secondary | ICD-10-CM | POA: Diagnosis not present

## 2018-06-20 NOTE — Telephone Encounter (Signed)
Dr End completed his portion of application. Called patient to let her know. She asked me to fax it to [employer] for her. Paper faxed to [phone]. She was appreciative.

## 2018-06-21 DIAGNOSIS — Z992 Dependence on renal dialysis: Secondary | ICD-10-CM | POA: Diagnosis not present

## 2018-06-21 DIAGNOSIS — D631 Anemia in chronic kidney disease: Secondary | ICD-10-CM | POA: Diagnosis not present

## 2018-06-21 DIAGNOSIS — N186 End stage renal disease: Secondary | ICD-10-CM | POA: Diagnosis not present

## 2018-06-21 DIAGNOSIS — D509 Iron deficiency anemia, unspecified: Secondary | ICD-10-CM | POA: Diagnosis not present

## 2018-06-22 ENCOUNTER — Telehealth: Payer: Self-pay | Admitting: Internal Medicine

## 2018-06-22 DIAGNOSIS — D631 Anemia in chronic kidney disease: Secondary | ICD-10-CM | POA: Diagnosis not present

## 2018-06-22 DIAGNOSIS — D509 Iron deficiency anemia, unspecified: Secondary | ICD-10-CM | POA: Diagnosis not present

## 2018-06-22 DIAGNOSIS — N186 End stage renal disease: Secondary | ICD-10-CM | POA: Diagnosis not present

## 2018-06-22 DIAGNOSIS — Z992 Dependence on renal dialysis: Secondary | ICD-10-CM | POA: Diagnosis not present

## 2018-06-22 NOTE — Telephone Encounter (Signed)
It can be held at the discretion of Dr. Delana Meyer.  If he would like to hold it, she should stop the medication 2 days before her procedure and restart it when Dr. Delana Meyer feels that it is safe to do so.  Nelva Bush, MD [facility] Pager: [phone]

## 2018-06-22 NOTE — Telephone Encounter (Signed)
Called patient's daughter, ok per DPR. She verbalized understanding to call Dr Nino Parsley office for advice. Routing to Dr Delana Meyer as well.

## 2018-06-22 NOTE — Telephone Encounter (Signed)
Daughter wants to know if eliquis should be held for upcoming procedue av fistula 12/20 with Dr. Delana Meyer

## 2018-06-22 NOTE — Telephone Encounter (Signed)
ROuting to Dr End for advice.

## 2018-06-23 DIAGNOSIS — Z992 Dependence on renal dialysis: Secondary | ICD-10-CM | POA: Diagnosis not present

## 2018-06-23 DIAGNOSIS — D631 Anemia in chronic kidney disease: Secondary | ICD-10-CM | POA: Diagnosis not present

## 2018-06-23 DIAGNOSIS — N186 End stage renal disease: Secondary | ICD-10-CM | POA: Diagnosis not present

## 2018-06-23 DIAGNOSIS — D509 Iron deficiency anemia, unspecified: Secondary | ICD-10-CM | POA: Diagnosis not present

## 2018-06-24 DIAGNOSIS — D631 Anemia in chronic kidney disease: Secondary | ICD-10-CM | POA: Diagnosis not present

## 2018-06-24 DIAGNOSIS — N186 End stage renal disease: Secondary | ICD-10-CM | POA: Diagnosis not present

## 2018-06-24 DIAGNOSIS — D509 Iron deficiency anemia, unspecified: Secondary | ICD-10-CM | POA: Diagnosis not present

## 2018-06-24 DIAGNOSIS — Z992 Dependence on renal dialysis: Secondary | ICD-10-CM | POA: Diagnosis not present

## 2018-06-25 DIAGNOSIS — N186 End stage renal disease: Secondary | ICD-10-CM | POA: Diagnosis not present

## 2018-06-25 DIAGNOSIS — D631 Anemia in chronic kidney disease: Secondary | ICD-10-CM | POA: Diagnosis not present

## 2018-06-25 DIAGNOSIS — Z992 Dependence on renal dialysis: Secondary | ICD-10-CM | POA: Diagnosis not present

## 2018-06-25 DIAGNOSIS — D509 Iron deficiency anemia, unspecified: Secondary | ICD-10-CM | POA: Diagnosis not present

## 2018-06-26 ENCOUNTER — Telehealth (INDEPENDENT_AMBULATORY_CARE_PROVIDER_SITE_OTHER): Payer: Self-pay

## 2018-06-26 DIAGNOSIS — Z992 Dependence on renal dialysis: Secondary | ICD-10-CM | POA: Diagnosis not present

## 2018-06-26 DIAGNOSIS — N186 End stage renal disease: Secondary | ICD-10-CM | POA: Diagnosis not present

## 2018-06-26 DIAGNOSIS — D631 Anemia in chronic kidney disease: Secondary | ICD-10-CM | POA: Diagnosis not present

## 2018-06-26 DIAGNOSIS — D509 Iron deficiency anemia, unspecified: Secondary | ICD-10-CM | POA: Diagnosis not present

## 2018-06-26 NOTE — Telephone Encounter (Signed)
Patient's daughter called wanting to know when the patient should stop her Eliquis, I gave the patient's the pre-procedure instructions from our surgical sheet that states she should stop her Eliquis three days prior.

## 2018-06-27 DIAGNOSIS — D509 Iron deficiency anemia, unspecified: Secondary | ICD-10-CM | POA: Diagnosis not present

## 2018-06-27 DIAGNOSIS — N186 End stage renal disease: Secondary | ICD-10-CM | POA: Diagnosis not present

## 2018-06-27 DIAGNOSIS — Z992 Dependence on renal dialysis: Secondary | ICD-10-CM | POA: Diagnosis not present

## 2018-06-27 DIAGNOSIS — D631 Anemia in chronic kidney disease: Secondary | ICD-10-CM | POA: Diagnosis not present

## 2018-06-28 DIAGNOSIS — N186 End stage renal disease: Secondary | ICD-10-CM | POA: Diagnosis not present

## 2018-06-28 DIAGNOSIS — Z992 Dependence on renal dialysis: Secondary | ICD-10-CM | POA: Diagnosis not present

## 2018-06-28 DIAGNOSIS — D631 Anemia in chronic kidney disease: Secondary | ICD-10-CM | POA: Diagnosis not present

## 2018-06-28 DIAGNOSIS — D509 Iron deficiency anemia, unspecified: Secondary | ICD-10-CM | POA: Diagnosis not present

## 2018-06-29 DIAGNOSIS — D509 Iron deficiency anemia, unspecified: Secondary | ICD-10-CM | POA: Diagnosis not present

## 2018-06-29 DIAGNOSIS — N186 End stage renal disease: Secondary | ICD-10-CM | POA: Diagnosis not present

## 2018-06-29 DIAGNOSIS — D631 Anemia in chronic kidney disease: Secondary | ICD-10-CM | POA: Diagnosis not present

## 2018-06-29 DIAGNOSIS — Z992 Dependence on renal dialysis: Secondary | ICD-10-CM | POA: Diagnosis not present

## 2018-06-30 ENCOUNTER — Encounter: Payer: Self-pay | Admitting: Internal Medicine

## 2018-06-30 ENCOUNTER — Ambulatory Visit
Admission: RE | Admit: 2018-06-30 | Discharge: 2018-06-30 | Disposition: A | Discharge status: Home / Self Care | Payer: Medicare Other | Attending: Vascular Surgery | Admitting: Vascular Surgery

## 2018-06-30 ENCOUNTER — Other Ambulatory Visit: Payer: Self-pay

## 2018-06-30 ENCOUNTER — Encounter
Admission: RE | Disposition: A | Discharge status: Home / Self Care | Payer: Self-pay | Source: Home / Self Care | Attending: Vascular Surgery

## 2018-06-30 ENCOUNTER — Ambulatory Visit (INDEPENDENT_AMBULATORY_CARE_PROVIDER_SITE_OTHER): Payer: Medicare Other | Admitting: Internal Medicine

## 2018-06-30 VITALS — BP 144/86 | HR 74 | Temp 98.5°F | Wt 199.0 lb

## 2018-06-30 DIAGNOSIS — Z5309 Procedure and treatment not carried out because of other contraindication: Secondary | ICD-10-CM | POA: Insufficient documentation

## 2018-06-30 DIAGNOSIS — N186 End stage renal disease: Secondary | ICD-10-CM | POA: Diagnosis not present

## 2018-06-30 DIAGNOSIS — J209 Acute bronchitis, unspecified: Secondary | ICD-10-CM | POA: Diagnosis not present

## 2018-06-30 DIAGNOSIS — Z992 Dependence on renal dialysis: Secondary | ICD-10-CM | POA: Diagnosis not present

## 2018-06-30 DIAGNOSIS — D631 Anemia in chronic kidney disease: Secondary | ICD-10-CM | POA: Diagnosis not present

## 2018-06-30 DIAGNOSIS — D509 Iron deficiency anemia, unspecified: Secondary | ICD-10-CM | POA: Diagnosis not present

## 2018-06-30 LAB — GLUCOSE, CAPILLARY: Glucose-Capillary: 124 mg/dL — ABNORMAL HIGH (ref 70–99)

## 2018-06-30 SURGERY — ARTERIOVENOUS (AV) FISTULA CREATION
Anesthesia: General | Laterality: Right

## 2018-06-30 MED ORDER — SODIUM CHLORIDE 0.9 % IV SOLN: INTRAVENOUS | Status: DC

## 2018-06-30 MED ORDER — CEFAZOLIN SODIUM-DEXTROSE 2-4 GM/100ML-% IV SOLN: 2.0000 g | INTRAVENOUS | Status: DC

## 2018-06-30 MED ORDER — FAMOTIDINE 20 MG PO TABS: 20.0000 mg | ORAL_TABLET | Freq: Once | ORAL | Status: DC

## 2018-06-30 MED ORDER — ALBUTEROL SULFATE HFA 108 (90 BASE) MCG/ACT IN AERS: 2.0000 | INHALATION_SPRAY | Freq: Four times a day (QID) | RESPIRATORY_TRACT | 0 refills | Status: DC | PRN

## 2018-06-30 MED ORDER — AMOXICILLIN-POT CLAVULANATE 875-125 MG PO TABS: 1.0000 | ORAL_TABLET | Freq: Two times a day (BID) | ORAL | 0 refills | Status: DC

## 2018-06-30 MED ORDER — PREDNISONE 10 MG PO TABS: ORAL_TABLET | ORAL | 0 refills | Status: DC

## 2018-06-30 SURGICAL SUPPLY — 57 items
APPLIER CLIP 11 MED OPEN (CLIP)
APPLIER CLIP 9.375 SM OPEN (CLIP)
BAG DECANTER FOR FLEXI CONT (MISCELLANEOUS) ×3 IMPLANT
BLADE SURG SZ11 CARB STEEL (BLADE) ×3 IMPLANT
BOOT SUTURE AID YELLOW STND (SUTURE) ×3 IMPLANT
BRUSH SCRUB EZ  4% CHG (MISCELLANEOUS) ×2
BRUSH SCRUB EZ 4% CHG (MISCELLANEOUS) ×1 IMPLANT
CANISTER SUCT 1200ML W/VALVE (MISCELLANEOUS) ×3 IMPLANT
CHLORAPREP W/TINT 26ML (MISCELLANEOUS) ×3 IMPLANT
CLIP APPLIE 11 MED OPEN (CLIP) IMPLANT
CLIP APPLIE 9.375 SM OPEN (CLIP) IMPLANT
COVER WAND RF STERILE (DRAPES) ×3 IMPLANT
DERMABOND ADVANCED (GAUZE/BANDAGES/DRESSINGS) ×2
DERMABOND ADVANCED .7 DNX12 (GAUZE/BANDAGES/DRESSINGS) ×1 IMPLANT
DRESSING SURGICEL FIBRLLR 1X2 (HEMOSTASIS) ×1 IMPLANT
DRSG SURGICEL FIBRILLAR 1X2 (HEMOSTASIS) ×3
ELECT CAUTERY BLADE 6.4 (BLADE) ×3 IMPLANT
ELECT REM PT RETURN 9FT ADLT (ELECTROSURGICAL) ×3
ELECTRODE REM PT RTRN 9FT ADLT (ELECTROSURGICAL) ×1 IMPLANT
GEL ULTRASOUND 20GR AQUASONIC (MISCELLANEOUS) IMPLANT
GLOVE BIO SURGEON STRL SZ7 (GLOVE) ×3 IMPLANT
GLOVE INDICATOR 7.5 STRL GRN (GLOVE) ×3 IMPLANT
GLOVE SURG SYN 8.0 (GLOVE) ×3 IMPLANT
GOWN STRL REUS W/ TWL LRG LVL3 (GOWN DISPOSABLE) ×2 IMPLANT
GOWN STRL REUS W/ TWL XL LVL3 (GOWN DISPOSABLE) ×1 IMPLANT
GOWN STRL REUS W/TWL LRG LVL3 (GOWN DISPOSABLE) ×4
GOWN STRL REUS W/TWL XL LVL3 (GOWN DISPOSABLE) ×2
IV NS 500ML (IV SOLUTION) ×2
IV NS 500ML BAXH (IV SOLUTION) ×1 IMPLANT
KIT TURNOVER KIT A (KITS) ×3 IMPLANT
LABEL OR SOLS (LABEL) ×3 IMPLANT
LOOP RED MAXI  1X406MM (MISCELLANEOUS) ×2
LOOP VESSEL MAXI 1X406 RED (MISCELLANEOUS) ×1 IMPLANT
LOOP VESSEL MINI 0.8X406 BLUE (MISCELLANEOUS) ×2 IMPLANT
LOOPS BLUE MINI 0.8X406MM (MISCELLANEOUS) ×4
NEEDLE FILTER BLUNT 18X 1/2SAF (NEEDLE) ×2
NEEDLE FILTER BLUNT 18X1 1/2 (NEEDLE) ×1 IMPLANT
NEEDLE HYPO 30X.5 LL (NEEDLE) IMPLANT
NS IRRIG 500ML POUR BTL (IV SOLUTION) ×3 IMPLANT
PACK EXTREMITY ARMC (MISCELLANEOUS) ×3 IMPLANT
PAD PREP 24X41 OB/GYN DISP (PERSONAL CARE ITEMS) ×3 IMPLANT
PUNCH SURGICAL ROTATE 2.7MM (MISCELLANEOUS) IMPLANT
STOCKINETTE STRL 4IN 9604848 (GAUZE/BANDAGES/DRESSINGS) ×3 IMPLANT
SUT MNCRL+ 5-0 UNDYED PC-3 (SUTURE) ×1 IMPLANT
SUT MONOCRYL 5-0 (SUTURE) ×2
SUT PROLENE 6 0 BV (SUTURE) ×12 IMPLANT
SUT SILK 2 0 (SUTURE) ×2
SUT SILK 2-0 18XBRD TIE 12 (SUTURE) ×1 IMPLANT
SUT SILK 3 0 (SUTURE) ×2
SUT SILK 3-0 18XBRD TIE 12 (SUTURE) ×1 IMPLANT
SUT SILK 4 0 (SUTURE) ×2
SUT SILK 4-0 18XBRD TIE 12 (SUTURE) ×1 IMPLANT
SUT VIC AB 3-0 SH 27 (SUTURE) ×2
SUT VIC AB 3-0 SH 27X BRD (SUTURE) ×1 IMPLANT
SYR 20CC LL (SYRINGE) ×3 IMPLANT
SYR 3ML LL SCALE MARK (SYRINGE) ×3 IMPLANT
TOWEL OR 17X26 4PK STRL BLUE (TOWEL DISPOSABLE) IMPLANT

## 2018-06-30 NOTE — Patient Instructions (Signed)
Acute Bronchitis, Adult Acute bronchitis is when air tubes (bronchi) in the lungs suddenly get swollen. The condition can make it hard to breathe. It can also cause these symptoms:  A cough.  Coughing up clear, yellow, or green mucus.  Wheezing.  Chest congestion.  Shortness of breath.  A fever.  Body aches.  Chills.  A sore throat. Follow these instructions at home:  Medicines  Take over-the-counter and prescription medicines only as told by your doctor.  If you were prescribed an antibiotic medicine, take it as told by your doctor. Do not stop taking the antibiotic even if you start to feel better. General instructions  Rest.  Drink enough fluids to keep your pee (urine) pale yellow.  Avoid smoking and secondhand smoke. If you smoke and you need help quitting, ask your doctor. Quitting will help your lungs heal faster.  Use an inhaler, cool mist vaporizer, or humidifier as told by your doctor.  Keep all follow-up visits as told by your doctor. This is important. How is this prevented? To lower your risk of getting this condition again:  Wash your hands often with soap and water. If you cannot use soap and water, use hand sanitizer.  Avoid contact with people who have cold symptoms.  Try not to touch your hands to your mouth, nose, or eyes.  Make sure to get the flu shot every year. Contact a doctor if:  Your symptoms do not get better in 2 weeks. Get help right away if:  You cough up blood.  You have chest pain.  You have very bad shortness of breath.  You become dehydrated.  You faint (pass out) or keep feeling like you are going to pass out.  You keep throwing up (vomiting).  You have a very bad headache.  Your fever or chills gets worse. This information is not intended to replace advice given to you by your health care provider. Make sure you discuss any questions you have with your health care provider. Document Released: 12/15/2007 Document  Revised: 02/09/2017 Document Reviewed: 12/17/2015 Elsevier Interactive Patient Education  2019 [employer]  

## 2018-06-30 NOTE — Progress Notes (Signed)
HPI  Pt presents to the clinic today with c/o cough. She reports this started 2 weeks ago. The cough is productive of yellow mucous. She has had some wheezing. She denies runny nose, nasal congestion, ear pain, sore throat or shortness of breath. She denies fever, chills or body aches. She has tried Tussin and Tylenol with minimal relief. She has a history of DM 2 but denies allergies or breathing problems. She has not had sick contacts.  Review of Systems      Past Medical History:  Diagnosis Date  . Arthritis   . Atrial fibrillation ([place])   . Back pain   . Cataract   . Diabetes mellitus without complication ([place])   . Dialysis patient ([place])    2/19- changed from hemodialysis to nightly peritoneal  . Dysrhythmia    artial fib  . Gout   . Hypertension   . Renal failure     Family History  Problem Relation Age of Onset  . Hypertension Father     Social History   Socioeconomic History  . Marital status: Single    Spouse name: Not on file  . Number of children: Not on file  . Years of education: Not on file  . Highest education level: Not on file  Occupational History  . Not on file  Social Needs  . Financial resource strain: Not on file  . Food insecurity:    Worry: Not on file    Inability: Not on file  . Transportation needs:    Medical: Not on file    Non-medical: Not on file  Tobacco Use  . Smoking status: Never Smoker  . Smokeless tobacco: Never Used  Substance and Sexual Activity  . Alcohol use: No  . Drug use: No  . Sexual activity: Not on file  Lifestyle  . Physical activity:    Days per week: Not on file    Minutes per session: Not on file  . Stress: Not on file  Relationships  . Social connections:    Talks on phone: Not on file    Gets together: Not on file    Attends religious service: Not on file    Active member of club or organization: Not on file    Attends meetings of clubs or organizations: Not on file    Relationship status: Not on  file  . Intimate partner violence:    Fear of current or ex partner: Not on file    Emotionally abused: Not on file    Physically abused: Not on file    Forced sexual activity: Not on file  Other Topics Concern  . Not on file  Social History Narrative  . Not on file    No Known Allergies   Constitutional:  Denies headache, fatigue, fever or abrupt weight changes.  HEENT:  Denies eye redness, eye pain, pressure behind the eyes, facial pain, nasal congestion, ear pain, ringing in the ears, wax buildup, runny nose or sore throat. Respiratory: Positive cough, wheezing. Denies difficulty breathing or shortness of breath.  Cardiovascular: Denies chest pain, chest tightness, palpitations or swelling in the hands or feet.   No other specific complaints in a complete review of systems (except as listed in HPI above).  Objective:   BP (!) 144/86   Pulse 74   Temp 98.5 F (36.9 C) (Oral)   Wt 199 lb (90.3 kg)   SpO2 94%   BMI 37.60 kg/m   Wt Readings from Last 3 Encounters:  06/30/18 199 lb (90.3 kg)  06/30/18 199 lb (90.3 kg)  06/16/18 200 lb 4.8 oz (90.9 kg)     General: Appears her stated age, in NAD. HEENT: Head: normal shape and size, no sinus tenderness noted;  Throat/Mouth: + PND. Teeth present, mucosa pink and moist, no exudate noted, no lesions or ulcerations noted.  Neck: No cervical lymphadenopathy.  Pulmonary/Chest: Normal effort with bilateral expiratory wheezing and scattered rhonchi throughout. No respiratory distress. No rales noted.       Assessment & Plan:   Acute Bronchitis:  Get some rest and drink plenty of water RX for Pred Taper x 6 days RX for Augmentin BID x 10 days RX for Albuterol 1 puffs Q4-6 hours as needed  RTC as needed or if symptoms persist.   Webb Silversmith, NP

## 2018-06-30 NOTE — Progress Notes (Signed)
Pt in [facility] for procedure with Dr. Delana Meyer. She reports runny nose and productive cough with white and yellow mucus x 1 week; has been using cough drops with little improvement. Denies fever; temp 98.0 this morning. Notified anesthesiologist. Dr. Rosey Bath evaluated patient in [place] and advises to reschedule. Pt advised to have symptoms evaluated with PCP. Dr. Delana Meyer notified; patient discharged to lobby and situation explained to patient's daughter. No further questions at this time.

## 2018-07-01 DIAGNOSIS — Z992 Dependence on renal dialysis: Secondary | ICD-10-CM | POA: Diagnosis not present

## 2018-07-01 DIAGNOSIS — N186 End stage renal disease: Secondary | ICD-10-CM | POA: Diagnosis not present

## 2018-07-01 DIAGNOSIS — D509 Iron deficiency anemia, unspecified: Secondary | ICD-10-CM | POA: Diagnosis not present

## 2018-07-01 DIAGNOSIS — D631 Anemia in chronic kidney disease: Secondary | ICD-10-CM | POA: Diagnosis not present

## 2018-07-02 DIAGNOSIS — D509 Iron deficiency anemia, unspecified: Secondary | ICD-10-CM | POA: Diagnosis not present

## 2018-07-02 DIAGNOSIS — Z992 Dependence on renal dialysis: Secondary | ICD-10-CM | POA: Diagnosis not present

## 2018-07-02 DIAGNOSIS — N186 End stage renal disease: Secondary | ICD-10-CM | POA: Diagnosis not present

## 2018-07-02 DIAGNOSIS — D631 Anemia in chronic kidney disease: Secondary | ICD-10-CM | POA: Diagnosis not present

## 2018-07-03 DIAGNOSIS — D631 Anemia in chronic kidney disease: Secondary | ICD-10-CM | POA: Diagnosis not present

## 2018-07-03 DIAGNOSIS — D509 Iron deficiency anemia, unspecified: Secondary | ICD-10-CM | POA: Diagnosis not present

## 2018-07-03 DIAGNOSIS — Z992 Dependence on renal dialysis: Secondary | ICD-10-CM | POA: Diagnosis not present

## 2018-07-03 DIAGNOSIS — N186 End stage renal disease: Secondary | ICD-10-CM | POA: Diagnosis not present

## 2018-07-04 DIAGNOSIS — D509 Iron deficiency anemia, unspecified: Secondary | ICD-10-CM | POA: Diagnosis not present

## 2018-07-04 DIAGNOSIS — N186 End stage renal disease: Secondary | ICD-10-CM | POA: Diagnosis not present

## 2018-07-04 DIAGNOSIS — D631 Anemia in chronic kidney disease: Secondary | ICD-10-CM | POA: Diagnosis not present

## 2018-07-04 DIAGNOSIS — Z992 Dependence on renal dialysis: Secondary | ICD-10-CM | POA: Diagnosis not present

## 2018-07-04 NOTE — Telephone Encounter (Signed)
Patient assistance forms received without patient application and patient consent . Please resend to : [phone]

## 2018-07-05 DIAGNOSIS — D631 Anemia in chronic kidney disease: Secondary | ICD-10-CM | POA: Diagnosis not present

## 2018-07-05 DIAGNOSIS — Z992 Dependence on renal dialysis: Secondary | ICD-10-CM | POA: Diagnosis not present

## 2018-07-05 DIAGNOSIS — N186 End stage renal disease: Secondary | ICD-10-CM | POA: Diagnosis not present

## 2018-07-05 DIAGNOSIS — D509 Iron deficiency anemia, unspecified: Secondary | ICD-10-CM | POA: Diagnosis not present

## 2018-07-06 DIAGNOSIS — N186 End stage renal disease: Secondary | ICD-10-CM | POA: Diagnosis not present

## 2018-07-06 DIAGNOSIS — D631 Anemia in chronic kidney disease: Secondary | ICD-10-CM | POA: Diagnosis not present

## 2018-07-06 DIAGNOSIS — D509 Iron deficiency anemia, unspecified: Secondary | ICD-10-CM | POA: Diagnosis not present

## 2018-07-06 DIAGNOSIS — Z992 Dependence on renal dialysis: Secondary | ICD-10-CM | POA: Diagnosis not present

## 2018-07-07 DIAGNOSIS — Z992 Dependence on renal dialysis: Secondary | ICD-10-CM | POA: Diagnosis not present

## 2018-07-07 DIAGNOSIS — D509 Iron deficiency anemia, unspecified: Secondary | ICD-10-CM | POA: Diagnosis not present

## 2018-07-07 DIAGNOSIS — D631 Anemia in chronic kidney disease: Secondary | ICD-10-CM | POA: Diagnosis not present

## 2018-07-07 DIAGNOSIS — N186 End stage renal disease: Secondary | ICD-10-CM | POA: Diagnosis not present

## 2018-07-08 DIAGNOSIS — Z992 Dependence on renal dialysis: Secondary | ICD-10-CM | POA: Diagnosis not present

## 2018-07-08 DIAGNOSIS — N186 End stage renal disease: Secondary | ICD-10-CM | POA: Diagnosis not present

## 2018-07-08 DIAGNOSIS — D509 Iron deficiency anemia, unspecified: Secondary | ICD-10-CM | POA: Diagnosis not present

## 2018-07-08 DIAGNOSIS — D631 Anemia in chronic kidney disease: Secondary | ICD-10-CM | POA: Diagnosis not present

## 2018-07-09 DIAGNOSIS — D631 Anemia in chronic kidney disease: Secondary | ICD-10-CM | POA: Diagnosis not present

## 2018-07-09 DIAGNOSIS — N186 End stage renal disease: Secondary | ICD-10-CM | POA: Diagnosis not present

## 2018-07-09 DIAGNOSIS — Z992 Dependence on renal dialysis: Secondary | ICD-10-CM | POA: Diagnosis not present

## 2018-07-09 DIAGNOSIS — D509 Iron deficiency anemia, unspecified: Secondary | ICD-10-CM | POA: Diagnosis not present

## 2018-07-10 DIAGNOSIS — D631 Anemia in chronic kidney disease: Secondary | ICD-10-CM | POA: Diagnosis not present

## 2018-07-10 DIAGNOSIS — Z992 Dependence on renal dialysis: Secondary | ICD-10-CM | POA: Diagnosis not present

## 2018-07-10 DIAGNOSIS — N186 End stage renal disease: Secondary | ICD-10-CM | POA: Diagnosis not present

## 2018-07-10 DIAGNOSIS — D509 Iron deficiency anemia, unspecified: Secondary | ICD-10-CM | POA: Diagnosis not present

## 2018-07-11 ENCOUNTER — Telehealth (INDEPENDENT_AMBULATORY_CARE_PROVIDER_SITE_OTHER): Payer: Self-pay

## 2018-07-11 DIAGNOSIS — Z992 Dependence on renal dialysis: Secondary | ICD-10-CM | POA: Diagnosis not present

## 2018-07-11 DIAGNOSIS — N186 End stage renal disease: Secondary | ICD-10-CM | POA: Diagnosis not present

## 2018-07-11 DIAGNOSIS — D509 Iron deficiency anemia, unspecified: Secondary | ICD-10-CM | POA: Diagnosis not present

## 2018-07-11 DIAGNOSIS — D631 Anemia in chronic kidney disease: Secondary | ICD-10-CM | POA: Diagnosis not present

## 2018-07-11 NOTE — Telephone Encounter (Signed)
Patient's daughter [name] called ready to reschedule the patient's surgery, but wanted to change from a fistula to a graft instead. Ms. Lovena Le wanted to know would she have to bring the patient back in because they had decided on the graft instead. I explained that I would ask Dr. Delana Meyer if she needed to be seen again or if I could just schedule this, Ms. Lovena Le stated she would call back on 07/13/18 to find out.

## 2018-07-12 DIAGNOSIS — D509 Iron deficiency anemia, unspecified: Secondary | ICD-10-CM | POA: Diagnosis not present

## 2018-07-12 DIAGNOSIS — N186 End stage renal disease: Secondary | ICD-10-CM | POA: Diagnosis not present

## 2018-07-12 DIAGNOSIS — N2581 Secondary hyperparathyroidism of renal origin: Secondary | ICD-10-CM | POA: Diagnosis not present

## 2018-07-12 DIAGNOSIS — D631 Anemia in chronic kidney disease: Secondary | ICD-10-CM | POA: Diagnosis not present

## 2018-07-12 DIAGNOSIS — Z992 Dependence on renal dialysis: Secondary | ICD-10-CM | POA: Diagnosis not present

## 2018-07-13 ENCOUNTER — Encounter (INDEPENDENT_AMBULATORY_CARE_PROVIDER_SITE_OTHER): Payer: Self-pay

## 2018-07-13 DIAGNOSIS — D509 Iron deficiency anemia, unspecified: Secondary | ICD-10-CM | POA: Diagnosis not present

## 2018-07-13 DIAGNOSIS — D631 Anemia in chronic kidney disease: Secondary | ICD-10-CM | POA: Diagnosis not present

## 2018-07-13 DIAGNOSIS — Z992 Dependence on renal dialysis: Secondary | ICD-10-CM | POA: Diagnosis not present

## 2018-07-13 DIAGNOSIS — N2581 Secondary hyperparathyroidism of renal origin: Secondary | ICD-10-CM | POA: Diagnosis not present

## 2018-07-13 DIAGNOSIS — N186 End stage renal disease: Secondary | ICD-10-CM | POA: Diagnosis not present

## 2018-07-14 DIAGNOSIS — D631 Anemia in chronic kidney disease: Secondary | ICD-10-CM | POA: Diagnosis not present

## 2018-07-14 DIAGNOSIS — N186 End stage renal disease: Secondary | ICD-10-CM | POA: Diagnosis not present

## 2018-07-14 DIAGNOSIS — N2581 Secondary hyperparathyroidism of renal origin: Secondary | ICD-10-CM | POA: Diagnosis not present

## 2018-07-14 DIAGNOSIS — Z992 Dependence on renal dialysis: Secondary | ICD-10-CM | POA: Diagnosis not present

## 2018-07-14 DIAGNOSIS — D509 Iron deficiency anemia, unspecified: Secondary | ICD-10-CM | POA: Diagnosis not present

## 2018-07-15 DIAGNOSIS — Z992 Dependence on renal dialysis: Secondary | ICD-10-CM | POA: Diagnosis not present

## 2018-07-15 DIAGNOSIS — D509 Iron deficiency anemia, unspecified: Secondary | ICD-10-CM | POA: Diagnosis not present

## 2018-07-15 DIAGNOSIS — N186 End stage renal disease: Secondary | ICD-10-CM | POA: Diagnosis not present

## 2018-07-15 DIAGNOSIS — D631 Anemia in chronic kidney disease: Secondary | ICD-10-CM | POA: Diagnosis not present

## 2018-07-15 DIAGNOSIS — N2581 Secondary hyperparathyroidism of renal origin: Secondary | ICD-10-CM | POA: Diagnosis not present

## 2018-07-16 DIAGNOSIS — D631 Anemia in chronic kidney disease: Secondary | ICD-10-CM | POA: Diagnosis not present

## 2018-07-16 DIAGNOSIS — Z992 Dependence on renal dialysis: Secondary | ICD-10-CM | POA: Diagnosis not present

## 2018-07-16 DIAGNOSIS — D509 Iron deficiency anemia, unspecified: Secondary | ICD-10-CM | POA: Diagnosis not present

## 2018-07-16 DIAGNOSIS — N186 End stage renal disease: Secondary | ICD-10-CM | POA: Diagnosis not present

## 2018-07-16 DIAGNOSIS — N2581 Secondary hyperparathyroidism of renal origin: Secondary | ICD-10-CM | POA: Diagnosis not present

## 2018-07-17 ENCOUNTER — Ambulatory Visit: Payer: Medicare Other | Admitting: Podiatry

## 2018-07-17 DIAGNOSIS — N186 End stage renal disease: Secondary | ICD-10-CM | POA: Diagnosis not present

## 2018-07-17 DIAGNOSIS — D509 Iron deficiency anemia, unspecified: Secondary | ICD-10-CM | POA: Diagnosis not present

## 2018-07-17 DIAGNOSIS — D631 Anemia in chronic kidney disease: Secondary | ICD-10-CM | POA: Diagnosis not present

## 2018-07-17 DIAGNOSIS — Z992 Dependence on renal dialysis: Secondary | ICD-10-CM | POA: Diagnosis not present

## 2018-07-17 DIAGNOSIS — N2581 Secondary hyperparathyroidism of renal origin: Secondary | ICD-10-CM | POA: Diagnosis not present

## 2018-07-18 DIAGNOSIS — N2581 Secondary hyperparathyroidism of renal origin: Secondary | ICD-10-CM | POA: Diagnosis not present

## 2018-07-18 DIAGNOSIS — E119 Type 2 diabetes mellitus without complications: Secondary | ICD-10-CM | POA: Diagnosis not present

## 2018-07-18 DIAGNOSIS — D509 Iron deficiency anemia, unspecified: Secondary | ICD-10-CM | POA: Diagnosis not present

## 2018-07-18 DIAGNOSIS — D631 Anemia in chronic kidney disease: Secondary | ICD-10-CM | POA: Diagnosis not present

## 2018-07-18 DIAGNOSIS — Z992 Dependence on renal dialysis: Secondary | ICD-10-CM | POA: Diagnosis not present

## 2018-07-18 DIAGNOSIS — I259 Chronic ischemic heart disease, unspecified: Secondary | ICD-10-CM | POA: Diagnosis not present

## 2018-07-18 DIAGNOSIS — N186 End stage renal disease: Secondary | ICD-10-CM | POA: Diagnosis not present

## 2018-07-19 ENCOUNTER — Other Ambulatory Visit (INDEPENDENT_AMBULATORY_CARE_PROVIDER_SITE_OTHER): Payer: Self-pay | Admitting: Nurse Practitioner

## 2018-07-19 DIAGNOSIS — D509 Iron deficiency anemia, unspecified: Secondary | ICD-10-CM | POA: Diagnosis not present

## 2018-07-19 DIAGNOSIS — N186 End stage renal disease: Secondary | ICD-10-CM | POA: Diagnosis not present

## 2018-07-19 DIAGNOSIS — D631 Anemia in chronic kidney disease: Secondary | ICD-10-CM | POA: Diagnosis not present

## 2018-07-19 DIAGNOSIS — Z992 Dependence on renal dialysis: Secondary | ICD-10-CM | POA: Diagnosis not present

## 2018-07-19 DIAGNOSIS — N2581 Secondary hyperparathyroidism of renal origin: Secondary | ICD-10-CM | POA: Diagnosis not present

## 2018-07-20 DIAGNOSIS — N2581 Secondary hyperparathyroidism of renal origin: Secondary | ICD-10-CM | POA: Diagnosis not present

## 2018-07-20 DIAGNOSIS — D509 Iron deficiency anemia, unspecified: Secondary | ICD-10-CM | POA: Diagnosis not present

## 2018-07-20 DIAGNOSIS — N186 End stage renal disease: Secondary | ICD-10-CM | POA: Diagnosis not present

## 2018-07-20 DIAGNOSIS — D631 Anemia in chronic kidney disease: Secondary | ICD-10-CM | POA: Diagnosis not present

## 2018-07-20 DIAGNOSIS — Z992 Dependence on renal dialysis: Secondary | ICD-10-CM | POA: Diagnosis not present

## 2018-07-20 NOTE — Telephone Encounter (Signed)
Spoke with patient's daughter, ok per DPR. She states they have been busy and it is possible they did not send their portion to [employer] yet.  She is going to check on that and fax their portion to them in the next day or so. She will call us back if there's anything else needed from [place].  She was appreciative.

## 2018-07-21 ENCOUNTER — Encounter
Admission: RE | Admit: 2018-07-21 | Discharge: 2018-07-21 | Disposition: A | Discharge status: Home / Self Care | Payer: Medicare Other | Source: Ambulatory Visit | Attending: Vascular Surgery | Admitting: Vascular Surgery

## 2018-07-21 ENCOUNTER — Other Ambulatory Visit: Payer: Self-pay

## 2018-07-21 DIAGNOSIS — N2581 Secondary hyperparathyroidism of renal origin: Secondary | ICD-10-CM | POA: Diagnosis not present

## 2018-07-21 DIAGNOSIS — D631 Anemia in chronic kidney disease: Secondary | ICD-10-CM | POA: Diagnosis not present

## 2018-07-21 DIAGNOSIS — N186 End stage renal disease: Secondary | ICD-10-CM | POA: Insufficient documentation

## 2018-07-21 DIAGNOSIS — Z992 Dependence on renal dialysis: Secondary | ICD-10-CM | POA: Diagnosis not present

## 2018-07-21 DIAGNOSIS — D509 Iron deficiency anemia, unspecified: Secondary | ICD-10-CM | POA: Diagnosis not present

## 2018-07-21 DIAGNOSIS — Z01812 Encounter for preprocedural laboratory examination: Secondary | ICD-10-CM | POA: Insufficient documentation

## 2018-07-21 NOTE — Patient Instructions (Signed)
Your procedure is scheduled on: Friday 08/04/2018 Report to [facility]. To find out your arrival time please call [phone] between 1PM - 3PM on Thursday 08/03/2018.  Remember: Instructions that are not followed completely may result in serious medical risk, up to and including death, or upon the discretion of your surgeon and anesthesiologist your surgery may need to be rescheduled.     _X__ 1. Do not eat food after midnight the night before your procedure.                 No gum chewing or hard candies. You may drink clear liquids up to 2 hours                 before you are scheduled to arrive for your surgery- DO not drink clear                 liquids within 2 hours of the start of your surgery.                 Clear Liquids include:  water, apple juice without pulp, clear carbohydrate                 drink such as Clearfast or Gatorade, Black Coffee or Tea (Do not add                 anything to coffee or tea).  __X__2.  On the morning of surgery brush your teeth with toothpaste and water, you                 may rinse your mouth with mouthwash if you wish.  Do not swallow any              toothpaste of mouthwash.     _X__ 3.  No Alcohol for 24 hours before or after surgery.   _X__ 4.  Do Not Smoke or use e-cigarettes For 24 Hours Prior to Your Surgery.                 Do not use any chewable tobacco products for at least 6 hours prior to                 surgery.  ____  5.  Bring all medications with you on the day of surgery if instructed.   __X__  6.  Notify your doctor if there is any change in your medical condition      (cold, fever, infections).     Do not wear jewelry, make-up, hairpins, clips or nail polish. Do not wear lotions, powders, or perfumes.  Do not shave 48 hours prior to surgery. Men may shave face and neck. Do not bring valuables to the hospital.    [facility] is not responsible for any belongings or  valuables.  Contacts, dentures/partials or body piercings may not be worn into surgery. Bring a case for your contacts, glasses or hearing aids, a denture cup will be supplied. Leave your suitcase in the car. After surgery it may be brought to your room. For patients admitted to the hospital, discharge time is determined by your treatment team.   Patients discharged the day of surgery will not be allowed to drive home.   Please read over the following fact sheets that you were given:   MRSA Information  __X__ Take these medicines the morning of surgery with A SIP OF WATER:  1. carvedilol (COREG)   2. gabapentin (NEURONTIN)   3.   4.  5.  6.  ____ Fleet Enema (as directed)   __X__ Use CHG Soap/SAGE wipes as directed  ____ Use inhalers on the day of surgery  ____ Stop metformin/Janumet/Farxiga 2 days prior to surgery    ____ Take 1/2 of usual insulin dose the night before surgery. No insulin the morning          of surgery.   __X__ Stop Blood Thinners Coumadin/Plavix/Xarelto/Pleta/Pradaxa/Eliquis/Effient/Aspirin  According to previous instructions  __X__ Stop Anti-inflammatories 7 days before surgery such as Advil, Ibuprofen, Motrin,  BC or Goodies Powder, Naprosyn, Naproxen, Aleve, Aspirin    __X__ Stop all herbal supplements, fish oil or vitamin E until after surgery.    ____ Bring C-Pap to the hospital.

## 2018-07-22 DIAGNOSIS — D509 Iron deficiency anemia, unspecified: Secondary | ICD-10-CM | POA: Diagnosis not present

## 2018-07-22 DIAGNOSIS — D631 Anemia in chronic kidney disease: Secondary | ICD-10-CM | POA: Diagnosis not present

## 2018-07-22 DIAGNOSIS — N186 End stage renal disease: Secondary | ICD-10-CM | POA: Diagnosis not present

## 2018-07-22 DIAGNOSIS — N2581 Secondary hyperparathyroidism of renal origin: Secondary | ICD-10-CM | POA: Diagnosis not present

## 2018-07-22 DIAGNOSIS — Z992 Dependence on renal dialysis: Secondary | ICD-10-CM | POA: Diagnosis not present

## 2018-07-23 DIAGNOSIS — N186 End stage renal disease: Secondary | ICD-10-CM | POA: Diagnosis not present

## 2018-07-23 DIAGNOSIS — N2581 Secondary hyperparathyroidism of renal origin: Secondary | ICD-10-CM | POA: Diagnosis not present

## 2018-07-23 DIAGNOSIS — D509 Iron deficiency anemia, unspecified: Secondary | ICD-10-CM | POA: Diagnosis not present

## 2018-07-23 DIAGNOSIS — Z992 Dependence on renal dialysis: Secondary | ICD-10-CM | POA: Diagnosis not present

## 2018-07-23 DIAGNOSIS — D631 Anemia in chronic kidney disease: Secondary | ICD-10-CM | POA: Diagnosis not present

## 2018-07-24 DIAGNOSIS — N186 End stage renal disease: Secondary | ICD-10-CM | POA: Diagnosis not present

## 2018-07-24 DIAGNOSIS — Z992 Dependence on renal dialysis: Secondary | ICD-10-CM | POA: Diagnosis not present

## 2018-07-24 DIAGNOSIS — D631 Anemia in chronic kidney disease: Secondary | ICD-10-CM | POA: Diagnosis not present

## 2018-07-24 DIAGNOSIS — N2581 Secondary hyperparathyroidism of renal origin: Secondary | ICD-10-CM | POA: Diagnosis not present

## 2018-07-24 DIAGNOSIS — D509 Iron deficiency anemia, unspecified: Secondary | ICD-10-CM | POA: Diagnosis not present

## 2018-07-25 DIAGNOSIS — D631 Anemia in chronic kidney disease: Secondary | ICD-10-CM | POA: Diagnosis not present

## 2018-07-25 DIAGNOSIS — D509 Iron deficiency anemia, unspecified: Secondary | ICD-10-CM | POA: Diagnosis not present

## 2018-07-25 DIAGNOSIS — Z992 Dependence on renal dialysis: Secondary | ICD-10-CM | POA: Diagnosis not present

## 2018-07-25 DIAGNOSIS — N186 End stage renal disease: Secondary | ICD-10-CM | POA: Diagnosis not present

## 2018-07-25 DIAGNOSIS — N2581 Secondary hyperparathyroidism of renal origin: Secondary | ICD-10-CM | POA: Diagnosis not present

## 2018-07-26 DIAGNOSIS — Z992 Dependence on renal dialysis: Secondary | ICD-10-CM | POA: Diagnosis not present

## 2018-07-26 DIAGNOSIS — N186 End stage renal disease: Secondary | ICD-10-CM | POA: Diagnosis not present

## 2018-07-26 DIAGNOSIS — D509 Iron deficiency anemia, unspecified: Secondary | ICD-10-CM | POA: Diagnosis not present

## 2018-07-26 DIAGNOSIS — D631 Anemia in chronic kidney disease: Secondary | ICD-10-CM | POA: Diagnosis not present

## 2018-07-26 DIAGNOSIS — N2581 Secondary hyperparathyroidism of renal origin: Secondary | ICD-10-CM | POA: Diagnosis not present

## 2018-07-27 ENCOUNTER — Other Ambulatory Visit: Payer: Self-pay | Admitting: Internal Medicine

## 2018-07-27 DIAGNOSIS — N186 End stage renal disease: Secondary | ICD-10-CM | POA: Diagnosis not present

## 2018-07-27 DIAGNOSIS — D631 Anemia in chronic kidney disease: Secondary | ICD-10-CM | POA: Diagnosis not present

## 2018-07-27 DIAGNOSIS — Z992 Dependence on renal dialysis: Secondary | ICD-10-CM | POA: Diagnosis not present

## 2018-07-27 DIAGNOSIS — N2581 Secondary hyperparathyroidism of renal origin: Secondary | ICD-10-CM | POA: Diagnosis not present

## 2018-07-27 DIAGNOSIS — D509 Iron deficiency anemia, unspecified: Secondary | ICD-10-CM | POA: Diagnosis not present

## 2018-07-28 DIAGNOSIS — N186 End stage renal disease: Secondary | ICD-10-CM | POA: Diagnosis not present

## 2018-07-28 DIAGNOSIS — Z992 Dependence on renal dialysis: Secondary | ICD-10-CM | POA: Diagnosis not present

## 2018-07-28 DIAGNOSIS — N2581 Secondary hyperparathyroidism of renal origin: Secondary | ICD-10-CM | POA: Diagnosis not present

## 2018-07-28 DIAGNOSIS — D631 Anemia in chronic kidney disease: Secondary | ICD-10-CM | POA: Diagnosis not present

## 2018-07-28 DIAGNOSIS — D509 Iron deficiency anemia, unspecified: Secondary | ICD-10-CM | POA: Diagnosis not present

## 2018-07-29 DIAGNOSIS — D631 Anemia in chronic kidney disease: Secondary | ICD-10-CM | POA: Diagnosis not present

## 2018-07-29 DIAGNOSIS — N186 End stage renal disease: Secondary | ICD-10-CM | POA: Diagnosis not present

## 2018-07-29 DIAGNOSIS — D509 Iron deficiency anemia, unspecified: Secondary | ICD-10-CM | POA: Diagnosis not present

## 2018-07-29 DIAGNOSIS — Z992 Dependence on renal dialysis: Secondary | ICD-10-CM | POA: Diagnosis not present

## 2018-07-29 DIAGNOSIS — N2581 Secondary hyperparathyroidism of renal origin: Secondary | ICD-10-CM | POA: Diagnosis not present

## 2018-07-30 DIAGNOSIS — D631 Anemia in chronic kidney disease: Secondary | ICD-10-CM | POA: Diagnosis not present

## 2018-07-30 DIAGNOSIS — N186 End stage renal disease: Secondary | ICD-10-CM | POA: Diagnosis not present

## 2018-07-30 DIAGNOSIS — N2581 Secondary hyperparathyroidism of renal origin: Secondary | ICD-10-CM | POA: Diagnosis not present

## 2018-07-30 DIAGNOSIS — Z992 Dependence on renal dialysis: Secondary | ICD-10-CM | POA: Diagnosis not present

## 2018-07-30 DIAGNOSIS — D509 Iron deficiency anemia, unspecified: Secondary | ICD-10-CM | POA: Diagnosis not present

## 2018-07-31 DIAGNOSIS — D509 Iron deficiency anemia, unspecified: Secondary | ICD-10-CM | POA: Diagnosis not present

## 2018-07-31 DIAGNOSIS — N2581 Secondary hyperparathyroidism of renal origin: Secondary | ICD-10-CM | POA: Diagnosis not present

## 2018-07-31 DIAGNOSIS — D631 Anemia in chronic kidney disease: Secondary | ICD-10-CM | POA: Diagnosis not present

## 2018-07-31 DIAGNOSIS — Z992 Dependence on renal dialysis: Secondary | ICD-10-CM | POA: Diagnosis not present

## 2018-07-31 DIAGNOSIS — N186 End stage renal disease: Secondary | ICD-10-CM | POA: Diagnosis not present

## 2018-08-01 DIAGNOSIS — N2581 Secondary hyperparathyroidism of renal origin: Secondary | ICD-10-CM | POA: Diagnosis not present

## 2018-08-01 DIAGNOSIS — Z992 Dependence on renal dialysis: Secondary | ICD-10-CM | POA: Diagnosis not present

## 2018-08-01 DIAGNOSIS — N186 End stage renal disease: Secondary | ICD-10-CM | POA: Diagnosis not present

## 2018-08-01 DIAGNOSIS — D631 Anemia in chronic kidney disease: Secondary | ICD-10-CM | POA: Diagnosis not present

## 2018-08-01 DIAGNOSIS — D509 Iron deficiency anemia, unspecified: Secondary | ICD-10-CM | POA: Diagnosis not present

## 2018-08-02 DIAGNOSIS — N186 End stage renal disease: Secondary | ICD-10-CM | POA: Diagnosis not present

## 2018-08-02 DIAGNOSIS — N2581 Secondary hyperparathyroidism of renal origin: Secondary | ICD-10-CM | POA: Diagnosis not present

## 2018-08-02 DIAGNOSIS — D509 Iron deficiency anemia, unspecified: Secondary | ICD-10-CM | POA: Diagnosis not present

## 2018-08-02 DIAGNOSIS — Z992 Dependence on renal dialysis: Secondary | ICD-10-CM | POA: Diagnosis not present

## 2018-08-02 DIAGNOSIS — D631 Anemia in chronic kidney disease: Secondary | ICD-10-CM | POA: Diagnosis not present

## 2018-08-03 DIAGNOSIS — N2581 Secondary hyperparathyroidism of renal origin: Secondary | ICD-10-CM | POA: Diagnosis not present

## 2018-08-03 DIAGNOSIS — N186 End stage renal disease: Secondary | ICD-10-CM | POA: Diagnosis not present

## 2018-08-03 DIAGNOSIS — D631 Anemia in chronic kidney disease: Secondary | ICD-10-CM | POA: Diagnosis not present

## 2018-08-03 DIAGNOSIS — Z992 Dependence on renal dialysis: Secondary | ICD-10-CM | POA: Diagnosis not present

## 2018-08-03 DIAGNOSIS — D509 Iron deficiency anemia, unspecified: Secondary | ICD-10-CM | POA: Diagnosis not present

## 2018-08-03 MED ORDER — CEFAZOLIN SODIUM-DEXTROSE 1-4 GM/50ML-% IV SOLN: 1.0000 g | Freq: Once | INTRAVENOUS | Status: DC

## 2018-08-04 ENCOUNTER — Encounter
Admission: RE | Disposition: A | Discharge status: Home / Self Care | Payer: Self-pay | Source: Home / Self Care | Attending: Vascular Surgery

## 2018-08-04 ENCOUNTER — Ambulatory Visit: Payer: Medicare Other | Admitting: Anesthesiology

## 2018-08-04 ENCOUNTER — Other Ambulatory Visit: Payer: Self-pay

## 2018-08-04 ENCOUNTER — Encounter: Payer: Self-pay | Admitting: *Deleted

## 2018-08-04 ENCOUNTER — Ambulatory Visit
Admission: RE | Admit: 2018-08-04 | Discharge: 2018-08-04 | Disposition: A | Discharge status: Home / Self Care | Payer: Medicare Other | Attending: Vascular Surgery | Admitting: Vascular Surgery

## 2018-08-04 DIAGNOSIS — I120 Hypertensive chronic kidney disease with stage 5 chronic kidney disease or end stage renal disease: Secondary | ICD-10-CM | POA: Diagnosis not present

## 2018-08-04 DIAGNOSIS — I480 Paroxysmal atrial fibrillation: Secondary | ICD-10-CM | POA: Insufficient documentation

## 2018-08-04 DIAGNOSIS — I5032 Chronic diastolic (congestive) heart failure: Secondary | ICD-10-CM | POA: Insufficient documentation

## 2018-08-04 DIAGNOSIS — N186 End stage renal disease: Secondary | ICD-10-CM | POA: Diagnosis not present

## 2018-08-04 DIAGNOSIS — Z7984 Long term (current) use of oral hypoglycemic drugs: Secondary | ICD-10-CM | POA: Insufficient documentation

## 2018-08-04 DIAGNOSIS — D631 Anemia in chronic kidney disease: Secondary | ICD-10-CM | POA: Diagnosis not present

## 2018-08-04 DIAGNOSIS — I132 Hypertensive heart and chronic kidney disease with heart failure and with stage 5 chronic kidney disease, or end stage renal disease: Secondary | ICD-10-CM | POA: Diagnosis not present

## 2018-08-04 DIAGNOSIS — Z7901 Long term (current) use of anticoagulants: Secondary | ICD-10-CM | POA: Diagnosis not present

## 2018-08-04 DIAGNOSIS — I951 Orthostatic hypotension: Secondary | ICD-10-CM | POA: Diagnosis not present

## 2018-08-04 DIAGNOSIS — E1122 Type 2 diabetes mellitus with diabetic chronic kidney disease: Secondary | ICD-10-CM

## 2018-08-04 DIAGNOSIS — R000 Tachycardia, unspecified: Secondary | ICD-10-CM | POA: Diagnosis not present

## 2018-08-04 DIAGNOSIS — E785 Hyperlipidemia, unspecified: Secondary | ICD-10-CM | POA: Insufficient documentation

## 2018-08-04 DIAGNOSIS — I4891 Unspecified atrial fibrillation: Secondary | ICD-10-CM | POA: Diagnosis not present

## 2018-08-04 DIAGNOSIS — N2581 Secondary hyperparathyroidism of renal origin: Secondary | ICD-10-CM | POA: Diagnosis not present

## 2018-08-04 DIAGNOSIS — Z5309 Procedure and treatment not carried out because of other contraindication: Secondary | ICD-10-CM | POA: Diagnosis not present

## 2018-08-04 DIAGNOSIS — M109 Gout, unspecified: Secondary | ICD-10-CM | POA: Insufficient documentation

## 2018-08-04 DIAGNOSIS — D509 Iron deficiency anemia, unspecified: Secondary | ICD-10-CM | POA: Diagnosis not present

## 2018-08-04 DIAGNOSIS — M1990 Unspecified osteoarthritis, unspecified site: Secondary | ICD-10-CM | POA: Insufficient documentation

## 2018-08-04 DIAGNOSIS — Z79899 Other long term (current) drug therapy: Secondary | ICD-10-CM | POA: Diagnosis not present

## 2018-08-04 DIAGNOSIS — Z992 Dependence on renal dialysis: Secondary | ICD-10-CM | POA: Diagnosis not present

## 2018-08-04 HISTORY — DX: Paroxysmal atrial fibrillation: I48.0

## 2018-08-04 HISTORY — DX: End stage renal disease: N18.6

## 2018-08-04 HISTORY — DX: Unspecified diastolic (congestive) heart failure: I50.30

## 2018-08-04 LAB — TYPE AND SCREEN
ABO/RH(D): B POS
Antibody Screen: NEGATIVE

## 2018-08-04 LAB — GLUCOSE, CAPILLARY
Glucose-Capillary: 127 mg/dL — ABNORMAL HIGH (ref 70–99)
Glucose-Capillary: 111 mg/dL — ABNORMAL HIGH (ref 70–99)

## 2018-08-04 LAB — CREATININE, SERUM
Creatinine, Ser: 11.88 mg/dL — ABNORMAL HIGH (ref 0.44–1.00)
GFR calc Af Amer: 3 mL/min — ABNORMAL LOW (ref >60)
GFR calc non Af Amer: 3 mL/min — ABNORMAL LOW (ref >60)

## 2018-08-04 LAB — POCT I-STAT 4, (NA,K, GLUC, HGB,HCT)
Glucose, Bld: 121 mg/dL — ABNORMAL HIGH (ref 70–99)
HCT: 31 % — ABNORMAL LOW (ref 36.0–46.0)
Hemoglobin: 10.5 g/dL — ABNORMAL LOW (ref 12.0–15.0)
Potassium: 4.1 mmol/L (ref 3.5–5.1)
Sodium: 136 mmol/L (ref 135–145)

## 2018-08-04 LAB — BUN: BUN: 42 mg/dL — ABNORMAL HIGH (ref 8–23)

## 2018-08-04 SURGERY — CANCELLED PROCEDURE
Anesthesia: General | Laterality: Right

## 2018-08-04 MED ORDER — METOPROLOL TARTRATE 25 MG PO TABS
ORAL_TABLET | ORAL | Status: AC
  Filled 2018-08-04: qty 1

## 2018-08-04 MED ORDER — FENTANYL CITRATE (PF) 100 MCG/2ML IJ SOLN
INTRAMUSCULAR | Status: AC
  Filled 2018-08-04: qty 2

## 2018-08-04 MED ORDER — FAMOTIDINE 20 MG PO TABS
20.0000 mg | ORAL_TABLET | Freq: Once | ORAL | Status: AC
  Administered 2018-08-04: 20 mg via ORAL

## 2018-08-04 MED ORDER — METOPROLOL TARTRATE 25 MG PO TABS: 25.0000 mg | ORAL_TABLET | Freq: Two times a day (BID) | ORAL | 6 refills | Status: DC

## 2018-08-04 MED ORDER — ONDANSETRON HCL 4 MG/2ML IJ SOLN
INTRAMUSCULAR | Status: AC
  Filled 2018-08-04: qty 2

## 2018-08-04 MED ORDER — DILTIAZEM HCL-DEXTROSE 100-5 MG/100ML-% IV SOLN (PREMIX)
5.0000 mg/h | INTRAVENOUS | Status: DC
  Filled 2018-08-04: qty 100

## 2018-08-04 MED ORDER — FAMOTIDINE 20 MG PO TABS
ORAL_TABLET | ORAL | Status: AC
  Administered 2018-08-04: 20 mg via ORAL
  Filled 2018-08-04: qty 1

## 2018-08-04 MED ORDER — HEPARIN SODIUM (PORCINE) 5000 UNIT/ML IJ SOLN
INTRAMUSCULAR | Status: AC
  Filled 2018-08-04: qty 1

## 2018-08-04 MED ORDER — PROPOFOL 10 MG/ML IV BOLUS
INTRAVENOUS | Status: AC
  Filled 2018-08-04: qty 20

## 2018-08-04 MED ORDER — METOPROLOL TARTRATE 25 MG PO TABS
25.0000 mg | ORAL_TABLET | Freq: Once | ORAL | Status: AC
  Administered 2018-08-04: 25 mg via ORAL

## 2018-08-04 MED ORDER — PAPAVERINE HCL 30 MG/ML IJ SOLN
INTRAMUSCULAR | Status: AC
  Filled 2018-08-04: qty 2

## 2018-08-04 MED ORDER — CEFAZOLIN SODIUM-DEXTROSE 1-4 GM/50ML-% IV SOLN
INTRAVENOUS | Status: AC
  Filled 2018-08-04: qty 50

## 2018-08-04 MED ORDER — BUPIVACAINE HCL (PF) 0.5 % IJ SOLN
INTRAMUSCULAR | Status: AC
  Filled 2018-08-04: qty 30

## 2018-08-04 MED ORDER — SODIUM CHLORIDE 0.9 % IV SOLN: Freq: Once | INTRAVENOUS | Status: DC

## 2018-08-04 MED ORDER — ONDANSETRON HCL 4 MG/2ML IJ SOLN: 4.0000 mg | Freq: Four times a day (QID) | INTRAMUSCULAR | Status: DC | PRN

## 2018-08-04 MED ORDER — SODIUM CHLORIDE 0.9 % IV SOLN
INTRAVENOUS | Status: DC
  Administered 2018-08-04: 07:00:00 via INTRAVENOUS

## 2018-08-04 MED ORDER — LIDOCAINE HCL (PF) 2 % IJ SOLN
INTRAMUSCULAR | Status: AC
  Filled 2018-08-04: qty 10

## 2018-08-04 MED ORDER — HYDROMORPHONE HCL 1 MG/ML IJ SOLN: 1.0000 mg | Freq: Once | INTRAMUSCULAR | Status: DC | PRN

## 2018-08-04 MED ORDER — METOPROLOL TARTRATE 5 MG/5ML IV SOLN
INTRAVENOUS | Status: DC | PRN
  Administered 2018-08-04: 5 mg via INTRAVENOUS

## 2018-08-04 SURGICAL SUPPLY — 58 items
APPLIER CLIP 11 MED OPEN (CLIP)
APPLIER CLIP 9.375 SM OPEN (CLIP)
BAG COUNTER SPONGE EZ (MISCELLANEOUS) IMPLANT
BAG DECANTER FOR FLEXI CONT (MISCELLANEOUS) IMPLANT
BLADE SURG SZ11 CARB STEEL (BLADE) IMPLANT
BOOT SUTURE AID YELLOW STND (SUTURE) IMPLANT
BRUSH SCRUB EZ  4% CHG (MISCELLANEOUS)
BRUSH SCRUB EZ 4% CHG (MISCELLANEOUS) IMPLANT
CANISTER SUCT 1200ML W/VALVE (MISCELLANEOUS) IMPLANT
CHLORAPREP W/TINT 26ML (MISCELLANEOUS) IMPLANT
CLIP APPLIE 11 MED OPEN (CLIP) IMPLANT
CLIP APPLIE 9.375 SM OPEN (CLIP) IMPLANT
COUNTER SPONGE BAG EZ (MISCELLANEOUS)
COVER WAND RF STERILE (DRAPES) IMPLANT
DERMABOND ADVANCED (GAUZE/BANDAGES/DRESSINGS)
DERMABOND ADVANCED .7 DNX12 (GAUZE/BANDAGES/DRESSINGS) IMPLANT
DRESSING SURGICEL FIBRLLR 1X2 (HEMOSTASIS) IMPLANT
DRSG SURGICEL FIBRILLAR 1X2 (HEMOSTASIS)
ELECT CAUTERY BLADE 6.4 (BLADE) IMPLANT
ELECT REM PT RETURN 9FT ADLT (ELECTROSURGICAL)
ELECTRODE REM PT RTRN 9FT ADLT (ELECTROSURGICAL) IMPLANT
GLOVE BIO SURGEON STRL SZ7 (GLOVE) IMPLANT
GLOVE INDICATOR 7.5 STRL GRN (GLOVE) IMPLANT
GLOVE SURG SYN 8.0 (GLOVE) IMPLANT
GOWN STRL REUS W/ TWL LRG LVL3 (GOWN DISPOSABLE) IMPLANT
GOWN STRL REUS W/ TWL XL LVL3 (GOWN DISPOSABLE) IMPLANT
GOWN STRL REUS W/TWL LRG LVL3 (GOWN DISPOSABLE)
GOWN STRL REUS W/TWL XL LVL3 (GOWN DISPOSABLE)
IV NS 500ML (IV SOLUTION)
IV NS 500ML BAXH (IV SOLUTION) IMPLANT
KIT TURNOVER KIT A (KITS) IMPLANT
LABEL OR SOLS (LABEL) IMPLANT
LOOP RED MAXI  1X406MM (MISCELLANEOUS)
LOOP VESSEL MAXI 1X406 RED (MISCELLANEOUS) IMPLANT
LOOP VESSEL MINI 0.8X406 BLUE (MISCELLANEOUS) IMPLANT
LOOPS BLUE MINI 0.8X406MM (MISCELLANEOUS)
NEEDLE FILTER BLUNT 18X 1/2SAF (NEEDLE)
NEEDLE FILTER BLUNT 18X1 1/2 (NEEDLE) IMPLANT
NS IRRIG 500ML POUR BTL (IV SOLUTION) IMPLANT
PACK EXTREMITY ARMC (MISCELLANEOUS) IMPLANT
PAD PREP 24X41 OB/GYN DISP (PERSONAL CARE ITEMS) IMPLANT
PUNCH SURGICAL ROTATE 2.7MM (MISCELLANEOUS) IMPLANT
STOCKINETTE STRL 4IN 9604848 (GAUZE/BANDAGES/DRESSINGS) IMPLANT
SUT GTX CV-6 30 (SUTURE) IMPLANT
SUT MNCRL+ 5-0 UNDYED PC-3 (SUTURE) IMPLANT
SUT MONOCRYL 5-0 (SUTURE)
SUT PROLENE 6 0 BV (SUTURE) IMPLANT
SUT SILK 2 0 (SUTURE)
SUT SILK 2 0 SH (SUTURE) IMPLANT
SUT SILK 2-0 18XBRD TIE 12 (SUTURE) IMPLANT
SUT SILK 3 0 (SUTURE)
SUT SILK 3-0 18XBRD TIE 12 (SUTURE) IMPLANT
SUT SILK 4 0 (SUTURE)
SUT SILK 4-0 18XBRD TIE 12 (SUTURE) IMPLANT
SUT VIC AB 3-0 SH 27 (SUTURE)
SUT VIC AB 3-0 SH 27X BRD (SUTURE) IMPLANT
SYR 20CC LL (SYRINGE) IMPLANT
SYR 3ML LL SCALE MARK (SYRINGE) IMPLANT

## 2018-08-04 NOTE — Anesthesia Post-op Follow-up Note (Signed)
Anesthesia QCDR form completed.        

## 2018-08-04 NOTE — Consult Note (Signed)
Cardiology Consult    Patient ID: Stacy Pham MRN: [id number], DOB/AGE: 83-Aug-1932   Admit date: 08/04/2018 Date of Consult: 08/04/2018  Primary Physician: Elby Beck, FNP Primary Cardiologist: Nelva Bush, MD Requesting Provider: Eloise Levels, MD  Patient Profile    Stacy Pham is a 85 y.o. female with a history of PAF, HFpEF, HTN, DMII, and ESRD, who is being seen today for the evaluation of recurrent AFib w/ RVR at the request of Dr. Delana Meyer.  Past Medical History   Past Medical History:  Diagnosis Date  . (HFpEF) heart failure with preserved ejection fraction ([place])    a. 10/2017 Echo: EF 60-65%, Gr1 DD, mild MR, mildly dil LA/RA. PASP 25mHg.  . Arthritis   . Back pain   . Cataract   . Diabetes mellitus without complication ([facility]   . [place]   . ESRD (end stage renal disease) ([facility]    a. 2018 - initially on HD but then transitioned to nightly PD in 08/2017.  .Marland KitchenGout   . Hypertension   . PAF (paroxysmal atrial fibrillation) ([facility])    a. CHA2DS2VASc = 5-->eliquis 2.5 BID.    Past Surgical History:  Procedure Laterality Date  . CAPD INSERTION N/A 06/08/2017   Procedure: LAPAROSCOPIC INSERTION CONTINUOUS AMBULATORY PERITONEAL DIALYSIS  (CAPD) CATHETER;  Surgeon: SKatha Cabal MD;  Location: [facility] ORS;  Service: Vascular;  Laterality: N/A;  . CATARACT EXTRACTION, BILATERAL Bilateral   . DIALYSIS/PERMA CATHETER INSERTION N/A 03/18/2017   Procedure: DIALYSIS/PERMA CATHETER INSERTION;  Surgeon: SKatha Cabal MD;  [facility];  Service: Cardiovascular;  Laterality: N/A;  . DIALYSIS/PERMA CATHETER REMOVAL N/A 10/20/2017   Procedure: DIALYSIS/PERMA CATHETER REMOVAL;  Surgeon: DAlgernon Huxley MD;  [facility];  Service: Cardiovascular;  Laterality: N/A;     Allergies  No Known Allergies  History of Present Illness    85y/o ? with a h/o PAF, HTN, HFpEF (EF 60-65%, Gr1 DD 10/2017), DMII, and ESRD.  PAF  [place].  She was apparently noted to be in afib and was started on both HD and eliquis 2.5 BID.  [facility] been followed by Dr. ESaunders Revelsince 08/2017.  She transitioned from HD to PD in 08/2017.  [facility] it was discovered at a routine nephrology visit.  She complained of feeling hot that day.  Fortunately, she converted to sinus rhythm while in the ED and has been maintained on coreg 6.224mBID ever since.  She was last seen in cardiology clinic in 04/2018, @ which point she was doing well.  She lives locally with her dtr and is fairly sedentary.  She is able to bathe herself and gets around her home, but requires a walker.  She is unaware of any recurrent PAF, c/p, or dyspnea.    [facility] reported URI x 1 wk and so case was cancelled and she f/u with PCP.  She has since recovered and AVF was rescheduled for today.  This AM, she took her coreg (held eliquis x 2 days), and presented for AVF.  Upon arrival in the OR, she was noted to be tachycardic and hypertensive.  She was given IV metoprolol with some rate slowing but BP dropped into the 90's.  The case was cancelled and she was transported back out to PACU.  She is currently asymptomatic and denies chest pain, palpitations, dyspnea, pnd, orthopnea, n, v, dizziness, syncope, edema, weight gain, or early satiety.   Home Medications      Prior to Admission medications   Medication Sig Start Date End Date Taking? Authorizing Provider  allopurinol (ZYLOPRIM) 100 MG tablet TAKE 1 TABLET BY MOUTH ONCE DAILY Patient taking differently: Take 100 mg by mouth at bedtime.  02/24/18  Yes Elby Beck, FNP  apixaban (ELIQUIS) 2.5 MG TABS tablet Take 1 tablet (2.5 mg total) by mouth 2 (two) times daily. 03/24/18  Yes End, Harrell Gave, MD    atorvastatin (LIPITOR) 20 MG tablet Take 1 tablet (20 mg total) by mouth every evening. 05/08/18  Yes Elby Beck, FNP  blood glucose meter kit and supplies KIT Please dispense either One Touch or Bayer Contour She must have one of these machines since she is on peritoneal dialysis. Use up to four times daily as directed. (FOR ICD-9 250.00, 250.01). 09/23/17  Yes Elby Beck, FNP  carvedilol (COREG) 6.25 MG tablet Take 1 tablet (6.25 mg total) by mouth 2 (two) times daily. 12/28/17  Yes End, Harrell Gave, MD  cinacalcet (SENSIPAR) 30 MG tablet Take 1 tablet (30 mg total) by mouth daily with supper. Patient taking differently: Take 30 mg by mouth daily. With largest meal 04/28/17  Yes [name], MD  colchicine 0.6 MG tablet Take 1 tablet (0.6 mg total) by mouth daily as needed (for gout flare ups). 05/10/18  Yes Elby Beck, FNP  ferric citrate (AURYXIA) 1 GM 210 MG(Fe) tablet Take 210 mg by mouth 3 (three) times daily with meals.   Yes [provider]  fluticasone (FLONASE) 50 MCG/ACT nasal spray Place 1 spray into both nostrils daily as needed for allergies or rhinitis. 09/07/17  Yes Elby Beck, FNP  gabapentin (NEURONTIN) 100 MG capsule Take 1 capsule (100 mg total) by mouth 3 (three) times daily. 10/12/17  Yes Elby Beck, FNP  glucose blood (ONETOUCH VERIO) test strip Test blood sugar four times daily. Dx 250.00, 250.01, E11.22, N18.6 09/23/17  Yes Elby Beck, FNP  hydroxypropyl methylcellulose / hypromellose (ISOPTO TEARS / GONIOVISC) 2.5 % ophthalmic solution Place 1-2 drops 3 (three) times daily as needed into both eyes for dry eyes.   Yes [provider]  Jonetta Speak LANCETS 06C MISC Test blood sugar four times daily. Dx 250.00, 250.01, E11.22, N18.6 09/23/17  Yes Elby Beck, FNP  acetaminophen (TYLENOL 8 HOUR ARTHRITIS PAIN) 650 MG CR tablet Take 650 mg every 8 (eight) hours as needed by mouth for pain.    [provider]  albuterol (PROVENTIL HFA;VENTOLIN HFA) 108 (90 Base) MCG/ACT inhaler Inhale 2 puffs into the lungs every 6 (six) hours as needed for wheezing or shortness of breath. Patient not taking: Reported on 07/19/2018 06/30/18   Jearld Fenton, NP     Family History    Family History  Problem Relation Age of Onset  . Hypertension Father    She indicated that her mother is deceased. She indicated that her father is deceased.   Social History    Social History   Socioeconomic History  . Marital status: Single    Spouse name: Not on file  . Number of children: Not on file  . Years of education: Not on file  . Highest education level: Not on file  Occupational History  .  Not on file  Social Needs  . Financial resource strain: Not on file  . Food insecurity:    Worry: Not on file    Inability: Not on file  . Transportation needs:    Medical: Not on file    Non-medical: Not on file  Tobacco Use  . Smoking status: Never Smoker  . Smokeless tobacco: Never Used  Substance and Sexual Activity  . Alcohol use: No  . Drug use: No  . Sexual activity: Not on file  Lifestyle  . Physical activity:    Days per week: Not on file    Minutes per session: Not on file  . Stress: Not on file  Relationships  . Social connections:    Talks on phone: Not on file    Gets together: Not on file    Attends religious service: Not on file    Active member of club or organization: Not on file    Attends meetings of clubs or organizations: Not on file    Relationship status: Not on file  . Intimate partner violence:    Fear of current or ex partner: Not on file    Emotionally abused: Not on file    Physically abused: Not on file    Forced sexual activity: Not on file  Other Topics Concern  . Not on file  Social History Narrative   Retired. Lives in [place] with her dtr who takes care of her.  Pt uses a walker to get around, though admits that ambulation over all is limited.      Review of Systems    General:  No chills, fever, night sweats or weight changes.  Cardiovascular:  chest pain, dyspnea on exertion, edema, orthopnea, palpitations, paroxysmal nocturnal dyspnea. Dermatological: No rash, lesions/masses Respiratory: No cough, dyspnea Urologic: No hematuria, dysuria Abdominal:   No nausea, vomiting, diarrhea, bright red blood per rectum, melena, or hematemesis Neurologic:  No visual changes, wkns, changes in mental status. All other systems reviewed and are otherwise negative except as noted above.  Physical Exam    Blood pressure (!) 127/92, pulse (!) 112, temperature (!) 97.2 F (36.2 C), temperature source Oral, resp. rate 19, height _0  (1.549 m), weight 89.8 kg, SpO2 99 %.  General: Pleasant, NAD Psych: Normal affect. Neuro: Alert and oriented X 3. Moves all extremities spontaneously. HEENT: Normal  Neck: Supple without bruits or JVD. Lungs:  Resp regular and unlabored, CTA. Heart: RRR no s3, s4, or murmurs. Abdomen: Soft, non-tender, non-distended, BS + x 4.  Extremities: No clubbing, cyanosis or edema. DP/PT/Radials 2+ and equal bilaterally.  Labs    Troponin [facility] of Care Test)  Lab Results  Component Value Date   WBC 9.5 06/16/2018   HGB 10.5 (L) 08/04/2018   HCT 31.0 (L) 08/04/2018   MCV 103.3 (H) 06/16/2018   PLT 309 06/16/2018    Recent Labs  Lab 08/04/18 0643 08/04/18 0646  NA  --  136  K  --  4.1  BUN 42*  --   CREATININE 11.88*  --   GLUCOSE  --  121*   Lab Results  Component Value Date   CHOL 179 05/03/2018   HDL 46 05/03/2018   LDLCALC 111 (H) 05/03/2018   TRIG 108 05/03/2018    Radiology Studies    No results found.  ECG & Cardiac Imaging    Afib, 117, leftward axis, nonspecific t changes  - personally reviewed.  Assessment & Plan    1.  PAF:  Pt w/ a h/o PAF on coreg and eliquis (2.5 bid).  She presented today for AVF in the setting of progressive renal failure on PD currently.  On arrival, she was  found to be in rapid afib.  She did take her coreg this AM but notes that she sometimes has to hold doses due to soft bp's @ home.  Following IV metoprolol this AM, BP dropped into the 90's.  She is asymptomatic and unaware as to when afib might have recurred but HR was 67 @ preop testing on 1/10.  BP did improved but then dropped back into the low 100's, thus we do not think she'll tolerate IV dilt at this time.  As she is asymptomatic, we will give a dose of oral metoprolol 51m x 1 now and switch her home  blocker from coreg to metoprolol 25 BID in an effort to give uKoreabetter HR lowering and more BP room to work with.  Surgery has been cancelled, thus she may resume eliquis.  We will plan to see her back in clinic within the next week to re-eval rate/rhythm and determine whether or not dccv +/- AAD is appropriate in the future.  Provided that rate stable over the next 1-2 hrs, she may be d/c'd from PACU from our standpoint. Rx sent to her pharmacy for metoprolol.  2.  HTN/relative hypotension:  Switching coreg to lopressor as above.  3.  ESRD:  On PD.  Pending AVF with future plan for HD.  4.  DMII:  Followed by PCP in outpt setting.  Signed, CMurray Hodgkins NP 08/04/2018, 9:35 AM  For questions or updates, please contact   Please consult [email or web address] for contact info under Cardiology/STEMI.

## 2018-08-04 NOTE — OR Nursing (Signed)
Discharge pending discharge order.  Dr. Delana Meyer rep notified of same @ 11:20 am.  Instructions from PACU RN were to monitor patient 30 min postop po metoprolol administration.

## 2018-08-04 NOTE — Transfer of Care (Signed)
Immediate Anesthesia Transfer of Care Note  Patient: Stacy Pham  Procedure(s) Performed: INSERTION OF ARTERIOVENOUS (AV) GORE-TEX GRAFT ARM (Right )  Patient Location: PACU  Anesthesia Type:no anesthetic given  Level of Consciousness: awake, alert , oriented and patient cooperative  Airway & Oxygen Therapy: Patient Spontanous Breathing  Post-op Assessment: Report given to RN and Post -op Vital signs reviewed and stable  Post vital signs: Reviewed and stable  Last Vitals:  Vitals Value Taken Time  BP 96/72 08/04/2018  8:56 AM  Temp    Pulse 111 08/04/2018  8:57 AM  Resp 17 08/04/2018  8:57 AM  SpO2 100 % 08/04/2018  8:57 AM  Vitals shown include unvalidated device data.  Last Pain:  Vitals:   08/04/18 0841  TempSrc:   PainSc: 0-No pain         Complications: No apparent anesthesia complications

## 2018-08-04 NOTE — Anesthesia Preprocedure Evaluation (Signed)
Anesthesia Evaluation  Patient identified by MRN, date of birth, ID band Patient awake    Reviewed: Allergy & Precautions, NPO status , Patient's Chart, lab work & pertinent test results, reviewed documented beta blocker date and time   History of Anesthesia Complications Negative for: history of anesthetic complications  Airway Mallampati: IV       Dental  (+) Upper Dentures, Lower Dentures   Pulmonary neg pulmonary ROS, neg sleep apnea, neg COPD,    Pulmonary exam normal        Cardiovascular hypertension, Pt. on medications and Pt. on home beta blockers (-) Past MI and (-) CHF Normal cardiovascular exam+ dysrhythmias Atrial Fibrillation (-) Valvular Problems/Murmurs     Neuro/Psych neg Seizures negative neurological ROS  negative psych ROS   GI/Hepatic Neg liver ROS, neg GERD  ,  Endo/Other  diabetes  Renal/GU ESRF and DialysisRenal disease     Musculoskeletal  (+) Arthritis ,   Abdominal Normal abdominal exam  (+)   Peds  Hematology  (+) anemia ,   Anesthesia Other Findings   Reproductive/Obstetrics                             Anesthesia Physical  Anesthesia Plan  ASA: IV  Anesthesia Plan: General   Post-op Pain Management:    Induction:   PONV Risk Score and Plan: 3 and Dexamethasone, Ondansetron, Midazolam and Treatment may vary due to age or medical condition  Airway Management Planned: Oral ETT  Additional Equipment:   Intra-op Plan:   Post-operative Plan: Extubation in OR  Informed Consent: I have reviewed the patients History and Physical, chart, labs and discussed the procedure including the risks, benefits and alternatives for the proposed anesthesia with the patient or authorized representative who has indicated his/her understanding and acceptance.       Plan Discussed with: CRNA and Surgeon  Anesthesia Plan Comments:         Anesthesia Quick  Evaluation

## 2018-08-04 NOTE — Discharge Instructions (Signed)
Patient is to stop Coreg (carvediolol) and start metoprolol twice a day. Patient was given Metoprolol 25mg  today and is to take one tonight. Per Dr Rockey Situ patient can take coreg tonight if unable to get prescription for metoprolol.

## 2018-08-04 NOTE — H&P (Signed)
[facility] Admission History & Physical  MRN : [id number]  Stacy Pham is a 85 y.o. (05-15-1931) female who presents with chief complaint of here for dialysis access.  History of Present Illness:   The patient presents to [facility] today for creation of an upper extremity dialysis access. Currently the patient is maintained via acatheter.  The patient has chronic shoulder and elbow pain on the left is been going on for many years and even though she is right-handed feels that a right sided access would be better.  The patient denies redness or swelling at the access site. The patient denies fever or chills at home or while on dialysis.  The patient denies amaurosis fugax or recent TIA symptoms. There are no recent neurological changes noted. The patient denies claudication symptoms or rest pain symptoms. The patient denies history of DVT, PE or superficial thrombophlebitis.  The patient denies recent episodes of angina or shortness of breath.  Vein mapping bilateral upper extremities demonstrates a 3.2 mm cephalic vein at the antecubital fossa bilaterally.  Current Facility-Administered Medications  Medication Dose Route Frequency Provider Last Rate Last Dose  . 0.9 %  sodium chloride infusion   Intravenous Once [place], Amy, MD      . 0.9 %  sodium chloride infusion   Intravenous Continuous Eulogio Ditch E, NP      . ceFAZolin (ANCEF) 1-4 GM/50ML-% IVPB           . ceFAZolin (ANCEF) IVPB 1 g/50 mL premix  1 g Intravenous Once Eulogio Ditch E, NP      . HYDROmorphone (DILAUDID) injection 1 mg  1 mg Intravenous Once PRN Kris Hartmann, NP      . ondansetron (ZOFRAN) injection 4 mg  4 mg Intravenous Q6H PRN Kris Hartmann, NP        Past Medical History:  Diagnosis Date  . Arthritis   . Atrial fibrillation ([place])   . Back pain   . Cataract   . Diabetes mellitus without complication ([place])   . Dialysis patient ([place])    2/19- changed from  hemodialysis to nightly peritoneal  . Dysrhythmia    artial fib  . Gout   . Hypertension   . Renal failure     Past Surgical History:  Procedure Laterality Date  . CAPD INSERTION N/A 06/08/2017   Procedure: LAPAROSCOPIC INSERTION CONTINUOUS AMBULATORY PERITONEAL DIALYSIS  (CAPD) CATHETER;  Surgeon: Katha Cabal, MD;  Location: [facility] ORS;  Service: Vascular;  Laterality: N/A;  . CATARACT EXTRACTION, BILATERAL Bilateral   . DIALYSIS/PERMA CATHETER INSERTION N/A 03/18/2017   Procedure: DIALYSIS/PERMA CATHETER INSERTION;  Surgeon: Katha Cabal, MD;  Location: [place] [facility];  Service: Cardiovascular;  Laterality: N/A;  . DIALYSIS/PERMA CATHETER REMOVAL N/A 10/20/2017   Procedure: DIALYSIS/PERMA CATHETER REMOVAL;  Surgeon: Algernon Huxley, MD;  Location: [place] [facility];  Service: Cardiovascular;  Laterality: N/A;    Social History Social History   Tobacco Use  . Smoking status: Never Smoker  . Smokeless tobacco: Never Used  Substance Use Topics  . Alcohol use: No  . Drug use: No    Family History Family History  Problem Relation Age of Onset  . Hypertension Father   No family history of bleeding/clotting disorders, porphyria or autoimmune disease   No Known Allergies   REVIEW OF SYSTEMS (Negative unless checked)  Constitutional: [] Weight loss  [] Fever  [] Chills Cardiac: [] Chest pain   [] Chest pressure   [] Palpitations   []   Shortness of breath when laying flat   [] Shortness of breath at rest   [] Shortness of breath with exertion. Vascular:  [] Pain in legs with walking   [] Pain in legs at rest   [] Pain in legs when laying flat   [] Claudication   [] Pain in feet when walking  [] Pain in feet at rest  [] Pain in feet when laying flat   [] History of DVT   [] Phlebitis   [] Swelling in legs   [] Varicose veins   [] Non-healing ulcers Pulmonary:   [] Uses home oxygen   [] Productive cough   [] Hemoptysis   [] Wheeze  [] COPD   [] Asthma Neurologic:  [] Dizziness  [] Blackouts    [] Seizures   [] History of stroke   [] History of TIA  [] Aphasia   [] Temporary blindness   [] Dysphagia   [] Weakness or numbness in arms   [] Weakness or numbness in legs Musculoskeletal:  [] Arthritis   [x] Joint swelling   [x] Joint pain   [] Low back pain Hematologic:  [] Easy bruising  [] Easy bleeding   [] Hypercoagulable state   [] Anemic  [] Hepatitis Gastrointestinal:  [] Blood in stool   [] Vomiting blood  [] Gastroesophageal reflux/heartburn   [] Difficulty swallowing. Genitourinary:  [x] Chronic kidney disease   [] Difficult urination  [] Frequent urination  [] Burning with urination   [] Blood in urine Skin:  [] Rashes   [] Ulcers   [] Wounds Psychological:  [] History of anxiety   []  History of major depression.  Physical Examination  Vitals:   08/04/18 0624  BP: (!) 143/70  Pulse: (!) 101  Temp: (!) 97.2 F (36.2 C)  TempSrc: Oral  SpO2: 97%  Weight: 89.8 kg  Height: 5\' 1"  (1.549 m)   Body mass index is 37.41 kg/m. Gen: WD/WN, NAD Head: /AT, No temporalis wasting.  Ear/Nose/Throat: Hearing grossly intact, nares w/o erythema or drainage, oropharynx w/o Erythema/Exudate, Eyes: Sclera non-icteric, conjunctiva clear Neck: Supple, no nuchal rigidity.  No JVD.  Pulmonary:  Good air movement, no increased work of respiration or use of accessory muscles  Cardiac: RRR, normal S1, S2, no Murmurs, rubs or gallops. Vascular: tunneled catheter CD&I Vessel Right Left  Radial Palpable Palpable  Brachial Palpable Palpable  Carotid Palpable, without bruit Palpable, without bruit  Gastrointestinal: soft, non-tender/non-distended. No guarding/reflex. No masses, surgical incisions, or scars. Musculoskeletal: M/S 5/5 throughout.  No deformity or atrophy.  trace edema Neurologic: Sensation grossly intact in extremities.  Symmetrical.  Speech is fluent. Motor exam as listed above. Psychiatric: Judgment intact, Mood & affect appropriate for pt's clinical situation. Dermatologic: No rashes or ulcers noted.  No  cellulitis or open wounds. Lymph : No Cervical, Axillary, or Inguinal lymphadenopathy.      CBC Lab Results  Component Value Date   WBC 9.5 06/16/2018   HGB 10.5 (L) 08/04/2018   HCT 31.0 (L) 08/04/2018   MCV 103.3 (H) 06/16/2018   PLT 309 06/16/2018    BMET    Component Value Date/Time   NA 136 08/04/2018 0646   K 4.1 08/04/2018 0646   CL 92 (L) 06/16/2018 1451   CO2 23 06/16/2018 1451   GLUCOSE 121 (H) 08/04/2018 0646   BUN 42 (H) 08/04/2018 0643   CREATININE 11.88 (H) 08/04/2018 0643   CALCIUM 8.9 06/16/2018 1451   GFRNONAA 3 (L) 08/04/2018 0643   GFRAA 3 (L) 08/04/2018 0643   Estimated Creatinine Clearance: 3.5 mL/min (A) (by C-G formula based on SCr of 11.88 mg/dL (H)).  COAG Lab Results  Component Value Date   INR 1.43 06/16/2018   INR 1.08 06/08/2017   INR 1.09  04/22/2017    Radiology No results found.    Assessment/Plan 1. ESRD (end stage renal disease) ([place]) Recommend:  At this time the patient does not have appropriate extremity access for dialysis  Patient should have a right brachial cephalic fistula created.  Possibility of a graft creation was also discussed.  The risks, benefits and alternative therapies were reviewed in detail with the patient.  All questions were answered.  The patient agrees to proceed with surgery.    2. Hyperlipidemia, unspecified hyperlipidemia type Continue statin as ordered and reviewed, no changes at this time   3. Type 2 diabetes mellitus with chronic kidney disease on chronic dialysis, without long-term current use of insulin (HCC) Continue hypoglycemic medications as already ordered, these medications have been reviewed and there are no changes at this time.  Hgb A1C to be monitored as already arranged by primary service   4. Hypertension, unspecified type Continue antihypertensive medications as already ordered, these medications have been reviewed and there are no changes at this  time.    Hortencia Pilar, MD  08/04/2018 7:30 AM

## 2018-08-05 DIAGNOSIS — N186 End stage renal disease: Secondary | ICD-10-CM | POA: Diagnosis not present

## 2018-08-05 DIAGNOSIS — D631 Anemia in chronic kidney disease: Secondary | ICD-10-CM | POA: Diagnosis not present

## 2018-08-05 DIAGNOSIS — N2581 Secondary hyperparathyroidism of renal origin: Secondary | ICD-10-CM | POA: Diagnosis not present

## 2018-08-05 DIAGNOSIS — D509 Iron deficiency anemia, unspecified: Secondary | ICD-10-CM | POA: Diagnosis not present

## 2018-08-05 DIAGNOSIS — Z992 Dependence on renal dialysis: Secondary | ICD-10-CM | POA: Diagnosis not present

## 2018-08-06 DIAGNOSIS — Z992 Dependence on renal dialysis: Secondary | ICD-10-CM | POA: Diagnosis not present

## 2018-08-06 DIAGNOSIS — D509 Iron deficiency anemia, unspecified: Secondary | ICD-10-CM | POA: Diagnosis not present

## 2018-08-06 DIAGNOSIS — N2581 Secondary hyperparathyroidism of renal origin: Secondary | ICD-10-CM | POA: Diagnosis not present

## 2018-08-06 DIAGNOSIS — N186 End stage renal disease: Secondary | ICD-10-CM | POA: Diagnosis not present

## 2018-08-06 DIAGNOSIS — D631 Anemia in chronic kidney disease: Secondary | ICD-10-CM | POA: Diagnosis not present

## 2018-08-07 DIAGNOSIS — N186 End stage renal disease: Secondary | ICD-10-CM | POA: Diagnosis not present

## 2018-08-07 DIAGNOSIS — N2581 Secondary hyperparathyroidism of renal origin: Secondary | ICD-10-CM | POA: Diagnosis not present

## 2018-08-07 DIAGNOSIS — Z992 Dependence on renal dialysis: Secondary | ICD-10-CM | POA: Diagnosis not present

## 2018-08-07 DIAGNOSIS — D631 Anemia in chronic kidney disease: Secondary | ICD-10-CM | POA: Diagnosis not present

## 2018-08-07 DIAGNOSIS — D509 Iron deficiency anemia, unspecified: Secondary | ICD-10-CM | POA: Diagnosis not present

## 2018-08-07 NOTE — Anesthesia Postprocedure Evaluation (Addendum)
Anesthesia Post Note  Patient: Stacy Pham  Procedure(s) Performed: CANCELLED PROCEDURE (Right )  Patient location during evaluation: PACU Anesthesia Type: General Level of consciousness: awake and alert and oriented Pain management: pain level controlled Vital Signs Assessment: post-procedure vital signs reviewed and stable Respiratory status: spontaneous breathing Cardiovascular status: blood pressure returned to baseline Anesthetic complications: no     Last Vitals:  Vitals:   08/04/18 1117 08/04/18 1150  BP: (!) 113/46 125/68  Pulse: 94 90  Resp: 18 16  Temp:    SpO2: 100% 100%    Last Pain:  Vitals:   08/04/18 1150  TempSrc:   PainSc: 0-No pain       Patient cancelled and cardiology consulted for RVR due to Afib          Foster Frericks

## 2018-08-08 DIAGNOSIS — D631 Anemia in chronic kidney disease: Secondary | ICD-10-CM | POA: Diagnosis not present

## 2018-08-08 DIAGNOSIS — N186 End stage renal disease: Secondary | ICD-10-CM | POA: Diagnosis not present

## 2018-08-08 DIAGNOSIS — D509 Iron deficiency anemia, unspecified: Secondary | ICD-10-CM | POA: Diagnosis not present

## 2018-08-08 DIAGNOSIS — Z992 Dependence on renal dialysis: Secondary | ICD-10-CM | POA: Diagnosis not present

## 2018-08-08 DIAGNOSIS — N2581 Secondary hyperparathyroidism of renal origin: Secondary | ICD-10-CM | POA: Diagnosis not present

## 2018-08-09 DIAGNOSIS — N2581 Secondary hyperparathyroidism of renal origin: Secondary | ICD-10-CM | POA: Diagnosis not present

## 2018-08-09 DIAGNOSIS — Z992 Dependence on renal dialysis: Secondary | ICD-10-CM | POA: Diagnosis not present

## 2018-08-09 DIAGNOSIS — D509 Iron deficiency anemia, unspecified: Secondary | ICD-10-CM | POA: Diagnosis not present

## 2018-08-09 DIAGNOSIS — N186 End stage renal disease: Secondary | ICD-10-CM | POA: Diagnosis not present

## 2018-08-09 DIAGNOSIS — D631 Anemia in chronic kidney disease: Secondary | ICD-10-CM | POA: Diagnosis not present

## 2018-08-10 DIAGNOSIS — N2581 Secondary hyperparathyroidism of renal origin: Secondary | ICD-10-CM | POA: Diagnosis not present

## 2018-08-10 DIAGNOSIS — D509 Iron deficiency anemia, unspecified: Secondary | ICD-10-CM | POA: Diagnosis not present

## 2018-08-10 DIAGNOSIS — N186 End stage renal disease: Secondary | ICD-10-CM | POA: Diagnosis not present

## 2018-08-10 DIAGNOSIS — Z992 Dependence on renal dialysis: Secondary | ICD-10-CM | POA: Diagnosis not present

## 2018-08-10 DIAGNOSIS — D631 Anemia in chronic kidney disease: Secondary | ICD-10-CM | POA: Diagnosis not present

## 2018-08-11 ENCOUNTER — Encounter (INDEPENDENT_AMBULATORY_CARE_PROVIDER_SITE_OTHER): Payer: Self-pay

## 2018-08-11 DIAGNOSIS — D631 Anemia in chronic kidney disease: Secondary | ICD-10-CM | POA: Diagnosis not present

## 2018-08-11 DIAGNOSIS — N2581 Secondary hyperparathyroidism of renal origin: Secondary | ICD-10-CM | POA: Diagnosis not present

## 2018-08-11 DIAGNOSIS — D509 Iron deficiency anemia, unspecified: Secondary | ICD-10-CM | POA: Diagnosis not present

## 2018-08-11 DIAGNOSIS — Z992 Dependence on renal dialysis: Secondary | ICD-10-CM | POA: Diagnosis not present

## 2018-08-11 DIAGNOSIS — N186 End stage renal disease: Secondary | ICD-10-CM | POA: Diagnosis not present

## 2018-08-12 DIAGNOSIS — D509 Iron deficiency anemia, unspecified: Secondary | ICD-10-CM | POA: Diagnosis not present

## 2018-08-12 DIAGNOSIS — Z992 Dependence on renal dialysis: Secondary | ICD-10-CM | POA: Diagnosis not present

## 2018-08-12 DIAGNOSIS — N186 End stage renal disease: Secondary | ICD-10-CM | POA: Diagnosis not present

## 2018-08-13 DIAGNOSIS — D509 Iron deficiency anemia, unspecified: Secondary | ICD-10-CM | POA: Diagnosis not present

## 2018-08-13 DIAGNOSIS — N186 End stage renal disease: Secondary | ICD-10-CM | POA: Diagnosis not present

## 2018-08-13 DIAGNOSIS — Z992 Dependence on renal dialysis: Secondary | ICD-10-CM | POA: Diagnosis not present

## 2018-08-14 DIAGNOSIS — Z992 Dependence on renal dialysis: Secondary | ICD-10-CM | POA: Diagnosis not present

## 2018-08-14 DIAGNOSIS — D509 Iron deficiency anemia, unspecified: Secondary | ICD-10-CM | POA: Diagnosis not present

## 2018-08-14 DIAGNOSIS — N186 End stage renal disease: Secondary | ICD-10-CM | POA: Diagnosis not present

## 2018-08-15 DIAGNOSIS — D509 Iron deficiency anemia, unspecified: Secondary | ICD-10-CM | POA: Diagnosis not present

## 2018-08-15 DIAGNOSIS — Z992 Dependence on renal dialysis: Secondary | ICD-10-CM | POA: Diagnosis not present

## 2018-08-15 DIAGNOSIS — N186 End stage renal disease: Secondary | ICD-10-CM | POA: Diagnosis not present

## 2018-08-16 DIAGNOSIS — N186 End stage renal disease: Secondary | ICD-10-CM | POA: Diagnosis not present

## 2018-08-16 DIAGNOSIS — D509 Iron deficiency anemia, unspecified: Secondary | ICD-10-CM | POA: Diagnosis not present

## 2018-08-16 DIAGNOSIS — Z992 Dependence on renal dialysis: Secondary | ICD-10-CM | POA: Diagnosis not present

## 2018-08-17 ENCOUNTER — Ambulatory Visit (INDEPENDENT_AMBULATORY_CARE_PROVIDER_SITE_OTHER): Payer: Medicare Other | Admitting: Nurse Practitioner

## 2018-08-17 ENCOUNTER — Encounter: Payer: Self-pay | Admitting: Nurse Practitioner

## 2018-08-17 VITALS — BP 110/70 | HR 79 | Ht 62.0 in | Wt 194.0 lb

## 2018-08-17 DIAGNOSIS — N186 End stage renal disease: Secondary | ICD-10-CM

## 2018-08-17 DIAGNOSIS — I480 Paroxysmal atrial fibrillation: Secondary | ICD-10-CM | POA: Diagnosis not present

## 2018-08-17 DIAGNOSIS — E782 Mixed hyperlipidemia: Secondary | ICD-10-CM

## 2018-08-17 DIAGNOSIS — Z992 Dependence on renal dialysis: Secondary | ICD-10-CM | POA: Diagnosis not present

## 2018-08-17 DIAGNOSIS — D509 Iron deficiency anemia, unspecified: Secondary | ICD-10-CM | POA: Diagnosis not present

## 2018-08-17 MED ORDER — METOPROLOL TARTRATE 25 MG PO TABS: 12.5000 mg | ORAL_TABLET | Freq: Two times a day (BID) | ORAL | 6 refills | Status: DC

## 2018-08-17 MED ORDER — AMIODARONE HCL 200 MG PO TABS: ORAL_TABLET | ORAL | 6 refills | Status: DC

## 2018-08-17 NOTE — Patient Instructions (Addendum)
Medication Instructions:  Your physician has recommended you make the following change in your medication:  1- DECREASE Metoprolol Take 0.5 tablets (12.5 mg total) by mouth 2 (two) times daily 2- START Amiodarone Take 2 tablets (400mg  total) twice daily for two weeks, then take 1 tablet (200 mg total) once daily thereafter.  If you need a refill on your cardiac medications before your next appointment, please call your pharmacy.   Lab work: None ordered If you have labs (blood work) drawn today and your tests are completely normal, you will receive your results only by: [name] MyChart Message (if you have MyChart) OR . A paper copy in the mail If you have any lab test that is abnormal or we need to change your treatment, we will call you to review the results.  Testing/Procedures: None ordered   Follow-Up: At [facility], you and your health needs are our priority.  As part of our continuing mission to provide you with exceptional heart care, we have created designated Provider Care Teams.  These Care Teams include your primary Cardiologist (physician) and Advanced Practice Providers (APPs -  Physician Assistants and Nurse Practitioners) who all work together to provide you with the care you need, when you need it. You will need a follow up appointment in 6 weeks. You may see Nelva Bush, MD or Murray Hodgkins, NP

## 2018-08-17 NOTE — Progress Notes (Signed)
Office Visit    Patient Name: Stacy Pham Date of Encounter: 08/17/2018  Primary Care Provider:  Elby Beck, FNP Primary Cardiologist:  Nelva Bush, MD  Chief Complaint    85 year old female with a history of paroxysmal atrial fibrillation, HFpEF, hypertension, type 2 diabetes mellitus, and end-stage renal disease, who presents for follow-up after recent recurrence of A. fib  Past Medical History    Past Medical History:  Diagnosis Date  . (HFpEF) heart failure with preserved ejection fraction ([place])    a. 10/2017 Echo: EF 60-65%, Gr1 DD, mild MR, mildly dil LA/RA. PASP 29mHg.  . Arthritis   . Back pain   . Cataract   . [place]   . D[place] ([place]   . [place] ([place]    a. 2018 - initially on HD but then transitioned to nightly PD in 08/2017.  .Marland KitchenGout   . Hypertension   . PAF (paroxysmal atrial fibrillation) (HCC)    a. CHA2DS2VASc = 5-->eliquis 2.5 BID.   Past Surgical History:  Procedure Laterality Date  . CAPD INSERTION N/A 06/08/2017   Procedure: LAPAROSCOPIC INSERTION CONTINUOUS AMBULATORY PERITONEAL DIALYSIS  (CAPD) CATHETER;  Surgeon: SKatha Cabal MD;  Location: [facility] ORS;  Service: Vascular;  Laterality: N/A;  . CATARACT EXTRACTION, BILATERAL Bilateral   . DIALYSIS/PERMA CATHETER INSERTION N/A 03/18/2017   Procedure: DIALYSIS/PERMA CATHETER INSERTION;  Surgeon: SKatha Cabal MD;  [facility];  Service: Cardiovascular;  Laterality: N/A;  . DIALYSIS/PERMA CATHETER REMOVAL N/A 10/20/2017   Procedure: DIALYSIS/PERMA CATHETER REMOVAL;  Surgeon: DAlgernon Huxley MD;  [facility];  Service: Cardiovascular;  Laterality: N/A;    Allergies  No Known Allergies  History of Present Illness    85year old female with a history of paroxysmal atrial fibrillation, hypertension, HFpEF (EF 60 to 696% grade 1 diastolic dysfunction-April 2019), type 2 diabetes  mellitus, and end-stage renal disease.  She has been on Eliquis since diagnosis of atrial fibrillation in 2018.  In the setting of end-stage renal disease, she was previously on hemodialysis but transition to peritoneal dialysis in February 2019.  She is in the process of having an AV fistula placed so that she may resume hemodialysis at the recommendation of her nephrologist.  She recently presented on January 24 for AV fistula however upon presenting, she was noted to be in rapid atrial fibrillation.  She was also hypertensive.  Case was canceled and we saw her in PACU.  We decided to switch her carvedilol to metoprolol, and arrange for follow-up today.  Of note, she was asymptomatic when she was found to be in atrial fibrillation perioperatively.  She is in sinus rhythm today.  She says she has remained asymptomatic.  She is not particularly active but denies chest pain, dyspnea, palpitations, PND, orthopnea, dizziness, syncope, edema, or early satiety.  She is tentatively scheduled for fistula placement on February 19.  Her daughter is with her today and indicates that because her blood pressure typically runs low, she has been only giving her metoprolol about once every other day.  She does track her heart rate closely and notes 2 occasions where heart rates elevated into the 1 teens and another into the 140s.  Patient was asymptomatic on both occasions.  Home Medications    Prior to Admission medications   Medication Sig Start Date End Date Taking? Authorizing Provider  acetaminophen (TYLENOL 8 HOUR ARTHRITIS PAIN) 650 MG CR  tablet Take 650 mg every 8 (eight) hours as needed by mouth for pain.   Yes [provider]  allopurinol (ZYLOPRIM) 100 MG tablet TAKE 1 TABLET BY MOUTH ONCE DAILY Patient taking differently: Take 100 mg by mouth at bedtime.  02/24/18  Yes Elby Beck, FNP  apixaban (ELIQUIS) 2.5 MG TABS tablet Take 1 tablet (2.5 mg total) by mouth 2 (two) times daily. 03/24/18  Yes  End, Harrell Gave, MD  atorvastatin (LIPITOR) 20 MG tablet Take 1 tablet (20 mg total) by mouth every evening. 05/08/18  Yes Elby Beck, FNP  blood glucose meter kit and supplies KIT Please dispense either One Touch or Bayer Contour She must have one of these machines since she is on peritoneal dialysis. Use up to four times daily as directed. (FOR ICD-9 250.00, 250.01). 09/23/17  Yes Elby Beck, FNP  calcitRIOL (ROCALTROL) 0.25 MCG capsule Take 0.25 mcg by mouth daily.   Yes [provider]  cinacalcet (SENSIPAR) 30 MG tablet Take 1 tablet (30 mg total) by mouth daily with supper. Patient taking differently: Take 30 mg by mouth daily. With largest meal 04/28/17  Yes [name], MD  colchicine 0.6 MG tablet Take 1 tablet (0.6 mg total) by mouth daily as needed (for gout flare ups). 05/10/18  Yes Elby Beck, FNP  ferric citrate (AURYXIA) 1 GM 210 MG(Fe) tablet Take 210 mg by mouth 3 (three) times daily with meals.   Yes [provider]  gabapentin (NEURONTIN) 100 MG capsule Take 1 capsule (100 mg total) by mouth 3 (three) times daily. 10/12/17  Yes Elby Beck, FNP  glucose blood (ONETOUCH VERIO) test strip Test blood sugar four times daily. Dx 250.00, 250.01, E11.22, N18.6 09/23/17  Yes Elby Beck, FNP  hydroxypropyl methylcellulose / hypromellose (ISOPTO TEARS / GONIOVISC) 2.5 % ophthalmic solution Place 1-2 drops 3 (three) times daily as needed into both eyes for dry eyes.   Yes [provider]  metoprolol tartrate (LOPRESSOR) 25 MG tablet Take 1 tablet (25 mg total) by mouth 2 (two) times daily. 08/04/18  Yes Theora Gianotti, NP  [facility] DELICA LANCETS 62H MISC Test blood sugar four times daily. Dx 250.00, 250.01, E11.22, N18.6 09/23/17  Yes Elby Beck, FNP    Review of Systems    She denies chest pain, palpitations, dyspnea, pnd, orthopnea, n, v, dizziness, syncope, edema, weight gain, or early satiety.  She tolerates  peritoneal dialysis well.  All other systems reviewed and are otherwise negative except as noted above.  Physical Exam    VS:  BP 110/70 (BP Location: Right Arm, Patient Position: Sitting, Cuff Size: Normal)   Pulse 79   Ht _0  (1.575 m)   Wt 194 lb (88 kg)   BMI 35.48 kg/m  , BMI Body mass index is 35.48 kg/m. GEN: Well nourished, well developed, in no acute distress. HEENT: normal. Neck: Supple, no JVD, carotid bruits, or masses. Cardiac: RRR, no murmurs, rubs, or gallops. No clubbing, cyanosis, edema.  Radials/DP/PT 2+ and equal bilaterally.  Respiratory:  Respirations regular and unlabored, clear to auscultation bilaterally. GI: Soft, nontender, nondistended, BS + x 4. MS: no deformity or atrophy. Skin: warm and dry, no rash. Neuro:  Strength and sensation are intact. Psych: Normal affect.  Accessory Clinical Findings    ECG personally reviewed by me today -regular sinus rhythm, 79, frequent PACs, left axis, borderline left atrial enlargement- no acute changes.  Assessment & Plan    1.  Paroxysmal atrial fibrillation: Patient recently presented for AV fistula placement and was found to be in rapid atrial fibrillation.  She was asymptomatic.  We switched her carvedilol to metoprolol for improved rate control, especially given report of low blood pressures.  Her daughter has noted improved heart rates though she intermittently does have episodes of tachycardia, which have been asymptomatic.  Because of soft blood pressures, she is only receiving metoprolol 25 mg about once every other day.  Given difficulty in utilizing beta-blocker due to relative hypotension and recurrent tachycardia, it appears that she would be best served with initiation of antiarrhythmic therapy.  We prev discussed this w/ her during brief hosp stay.  She has been compliant with Eliquis.  I am going to add amiodarone 200 mg twice daily x2 weeks and then she will drop to 200 mg daily.  Hopefully this will be an  adequate load to get her through her AV fistula surgery.  Plan to see her back in about 6 weeks, at which point we can follow-up lab work and also arrange for outpatient pulmonary function tests.  We discussed potential side effects of amiodarone at length and patient and family member are comfortable with initiating.  2.  Essential hypertension/relative hypotension: Pressures have been soft and thus have prevented her from receiving metoprolol.  Her daughter says that without metoprolol sometimes pressures get quite high.  In that setting, we have agreed to drop the dose to 12.5 mg twice daily.  It is possible that this will be able to be discontinued altogether following initiation of amiodarone.  3.  End-stage renal disease: She remains on peritoneal dialysis as she awaits AV fistula placement later this month.  4.  Type 2 diabetes mellitus: This is diet controlled with an A1c of 7.0.  5.  Hyperlipidemia: She remains on statin therapy.  Last LDL was 111 in October 2019.  6.  Disposition: Follow-up in approximately 6 weeks for office follow-up and c-Met, TSH, and we can also arrange pulmonary function testing at that time.   Murray Hodgkins, NP 08/17/2018, 5:48 PM

## 2018-08-18 DIAGNOSIS — Z992 Dependence on renal dialysis: Secondary | ICD-10-CM | POA: Diagnosis not present

## 2018-08-18 DIAGNOSIS — N186 End stage renal disease: Secondary | ICD-10-CM | POA: Diagnosis not present

## 2018-08-18 DIAGNOSIS — D509 Iron deficiency anemia, unspecified: Secondary | ICD-10-CM | POA: Diagnosis not present

## 2018-08-19 DIAGNOSIS — D509 Iron deficiency anemia, unspecified: Secondary | ICD-10-CM | POA: Diagnosis not present

## 2018-08-19 DIAGNOSIS — N186 End stage renal disease: Secondary | ICD-10-CM | POA: Diagnosis not present

## 2018-08-19 DIAGNOSIS — Z992 Dependence on renal dialysis: Secondary | ICD-10-CM | POA: Diagnosis not present

## 2018-08-20 DIAGNOSIS — N186 End stage renal disease: Secondary | ICD-10-CM | POA: Diagnosis not present

## 2018-08-20 DIAGNOSIS — D509 Iron deficiency anemia, unspecified: Secondary | ICD-10-CM | POA: Diagnosis not present

## 2018-08-20 DIAGNOSIS — Z992 Dependence on renal dialysis: Secondary | ICD-10-CM | POA: Diagnosis not present

## 2018-08-21 ENCOUNTER — Other Ambulatory Visit (INDEPENDENT_AMBULATORY_CARE_PROVIDER_SITE_OTHER): Payer: Self-pay | Admitting: Nurse Practitioner

## 2018-08-21 ENCOUNTER — Encounter
Admission: RE | Admit: 2018-08-21 | Discharge: 2018-08-21 | Disposition: A | Discharge status: Home / Self Care | Payer: Medicare Other | Source: Ambulatory Visit | Attending: Vascular Surgery | Admitting: Vascular Surgery

## 2018-08-21 ENCOUNTER — Telehealth: Payer: Self-pay | Admitting: Internal Medicine

## 2018-08-21 ENCOUNTER — Other Ambulatory Visit: Payer: Self-pay

## 2018-08-21 ENCOUNTER — Telehealth: Payer: Self-pay | Admitting: Cardiovascular Disease

## 2018-08-21 DIAGNOSIS — N186 End stage renal disease: Secondary | ICD-10-CM | POA: Diagnosis not present

## 2018-08-21 DIAGNOSIS — D509 Iron deficiency anemia, unspecified: Secondary | ICD-10-CM | POA: Diagnosis not present

## 2018-08-21 DIAGNOSIS — Z992 Dependence on renal dialysis: Secondary | ICD-10-CM | POA: Diagnosis not present

## 2018-08-21 DIAGNOSIS — Z01818 Encounter for other preprocedural examination: Secondary | ICD-10-CM | POA: Insufficient documentation

## 2018-08-21 HISTORY — DX: Bronchitis, not specified as acute or chronic: J40

## 2018-08-21 LAB — CBC WITH DIFFERENTIAL/PLATELET
ABS IMMATURE GRANULOCYTES: 0.04 10*3/uL (ref 0.00–0.07)
Basophils Absolute: 0.1 10*3/uL (ref 0.0–0.1)
Basophils Relative: 1 %
Eosinophils Absolute: 0.1 10*3/uL (ref 0.0–0.5)
Eosinophils Relative: 1 %
HCT: 32.6 % — ABNORMAL LOW (ref 36.0–46.0)
HEMOGLOBIN: 9.9 g/dL — AB (ref 12.0–15.0)
Immature Granulocytes: 0 %
LYMPHS ABS: 2.2 10*3/uL (ref 0.7–4.0)
Lymphocytes Relative: 20 %
MCH: 31.6 pg (ref 26.0–34.0)
MCHC: 30.4 g/dL (ref 30.0–36.0)
MCV: 104.2 fL — ABNORMAL HIGH (ref 80.0–100.0)
Monocytes Absolute: 0.9 10*3/uL (ref 0.1–1.0)
Monocytes Relative: 8 %
NEUTROS ABS: 7.6 10*3/uL (ref 1.7–7.7)
Neutrophils Relative %: 70 %
Platelets: 367 10*3/uL (ref 150–400)
RBC: 3.13 MIL/uL — ABNORMAL LOW (ref 3.87–5.11)
RDW: 14.1 % (ref 11.5–15.5)
WBC: 10.9 10*3/uL — ABNORMAL HIGH (ref 4.0–10.5)
nRBC: 0.2 % (ref 0.0–0.2)

## 2018-08-21 LAB — APTT: aPTT: 40 seconds — ABNORMAL HIGH (ref 24–36)

## 2018-08-21 LAB — BASIC METABOLIC PANEL
Anion gap: 13 (ref 5–15)
BUN: 38 mg/dL — ABNORMAL HIGH (ref 8–23)
CO2: 28 mmol/L (ref 22–32)
Calcium: 7.8 mg/dL — ABNORMAL LOW (ref 8.9–10.3)
Chloride: 92 mmol/L — ABNORMAL LOW (ref 98–111)
Creatinine, Ser: 12.23 mg/dL — ABNORMAL HIGH (ref 0.44–1.00)
GFR calc Af Amer: 3 mL/min — ABNORMAL LOW (ref >60)
GFR calc non Af Amer: 2 mL/min — ABNORMAL LOW (ref >60)
Glucose, Bld: 115 mg/dL — ABNORMAL HIGH (ref 70–99)
Potassium: 4.6 mmol/L (ref 3.5–5.1)
Sodium: 133 mmol/L — ABNORMAL LOW (ref 135–145)

## 2018-08-21 LAB — PROTIME-INR
INR: 1.45
Prothrombin Time: 17.5 seconds — ABNORMAL HIGH (ref 11.4–15.2)

## 2018-08-21 MED ORDER — ONDANSETRON HCL 4 MG/2ML IJ SOLN
4.0000 mg | Freq: Once | INTRAMUSCULAR | Status: DC | PRN
  Filled 2018-08-21: qty 2

## 2018-08-21 MED ORDER — FENTANYL CITRATE (PF) 100 MCG/2ML IJ SOLN
25.0000 ug | INTRAMUSCULAR | Status: DC | PRN
  Filled 2018-08-21: qty 0.5

## 2018-08-21 NOTE — Patient Instructions (Signed)
Your procedure is scheduled on: Wednesday, August 30, 2018 Report to Day Surgery on the 2nd floor of the [facility]. To find out your arrival time, please call [phone] between 1PM - 3PM on: Tuesday, August 29, 2018  REMEMBER: Instructions that are not followed completely may result in serious medical risk, up to and including death; or upon the discretion of your surgeon and anesthesiologist your surgery may need to be rescheduled.  Do not eat food after midnight the night before surgery.  No gum chewing, lozengers or hard candies.  You may however, drink water up to 2 hours before you are scheduled to arrive for your surgery. Do not drink anything within 2 hours of the start of your surgery.  No Alcohol for 24 hours before or after surgery.  On the morning of surgery brush your teeth with toothpaste and water, you may rinse your mouth with mouthwash if you wish. Do not swallow any toothpaste or mouthwash.  Notify your doctor if there is any change in your medical condition (cold, fever, infection).  Do not wear jewelry, make-up, hairpins, clips or nail polish.  Do not wear lotions, powders, or perfumes.   Do not shave 48 hours prior to surgery.   Contacts and dentures may not be worn into surgery.  Do not bring valuables to the hospital, including drivers license, insurance or credit cards.  [facility] is not responsible for any belongings or valuables.   TAKE THESE MEDICATIONS THE MORNING OF SURGERY:  1.  Amiodarone 2. Gabapentin 3.  Metoprolol  Use CHG wipes as directed on instruction sheet.  Follow recommendations from Cardiologist regarding stopping Eliquis.  Starting Feb. 12 - Stop Anti-inflammatories (NSAIDS) such as Advil, Aleve, Ibuprofen, Motrin, Naproxen, Naprosyn and Aspirin based products such as Excedrin, Goodys Powder, BC Powder. (May take Tylenol or Acetaminophen if needed.)  Starting Feb. 12 - Stop ANY OVER THE COUNTER supplements until after  surgery. (May continue Vitamin D, Vitamin B, and multivitamin.)  Wear comfortable clothing (specific to your surgery type) to the hospital.  If you are being discharged the day of surgery, you will not be allowed to drive home. You will need a responsible adult to drive you home and stay with you that night.   If you are taking public transportation, you will need to have a responsible adult with you. Please confirm with your physician that it is acceptable to use public transportation.   Please call [phone] if you have any questions about these instructions.

## 2018-08-21 NOTE — Telephone Encounter (Signed)
Medication Samples have been provided to the patient.  Drug name: Eliquis      Strength: 2.5MG         Qty: 2 boxes  LOT: [id number]  Exp.Date: 02/21  Janan Ridge 10:48 AM 08/21/2018

## 2018-08-21 NOTE — Pre-Procedure Instructions (Signed)
Copy and pasted from 08/17/2018 progress note:  Accessory Clinical Findings    ECG personally reviewed by me today -regular sinus rhythm, 79, frequent PACs, left axis, borderline left atrial enlargement- no acute changes.  Murray Hodgkins, NP 08/17/2018, 5:48 PM

## 2018-08-21 NOTE — Telephone Encounter (Signed)
Spoke with patient's daughter. 08/17/18 Thursday evening - Patient took first dose of amiodarone 400 mg. 08/18/18 Friday - HR 40  BP 92/71 - She did not give any more amiodarone or metoprolol that day. 08/19/18 Sat - AM - BP 109/72, HR 72 Took metoprolol Saturday morning only.   Pm - 114/75,  HR 92 08/20/18 Sat -  AM  BP 113/70,   HR 116   PM  BP 104/66,   HR 79 08/21/18 Mon- - AM  BP 102/68,  HR 82.   On Friday morning, patient was dizzy and had blurry double vision. This has subsided now. Patient only took one dose of amiodarone.  Only took metoprolol on Saturday morning and has not taken any since. Denies chest pain or shortness of breath.  Routing to C. Sharolyn Douglas, NP for further advice.

## 2018-08-21 NOTE — Telephone Encounter (Signed)
Pt c/o medication issue:  1. Name of Medication:  Amiodarone and metoprolol   2. How are you currently taking this medication (dosage and times per day)? Holding since Friday   3. Are you having a reaction (difficulty breathing--STAT)?  Dizzy   4. What is your medication issue?  Per daughter BP Friday 90's / 40's and HR 40's              Saturday 109/72     HR  88             114/75       HR     92                Monday 102/68    HR 82

## 2018-08-21 NOTE — Telephone Encounter (Signed)
Patient calling the office for samples of medication:   1.  What medication and dosage are you requesting samples for?  Eliquis 2.5 mg po BID   2.  Are you currently out of this medication? Yes patient assistance approval pending

## 2018-08-21 NOTE — Telephone Encounter (Signed)
   [facility] Pre-operative Risk Assessment    Request for surgical clearance:  1. What type of surgery is being performed? AV GRAFT  2. When is this surgery scheduled?  08/30/18  3. What type of clearance is required (medical clearance vs. Pharmacy clearance to hold med vs. Both)? PHARMACY  4. Are there any medications that need to be held prior to surgery and how long? ELIQUIS   5. Practice name and name of physician performing surgery? [facility]./  [facility]  6. What is your office phone number NOT LISTED   7.   What is your office fax number [phone]  8.   Anesthesia type (None, local, MAC, general) ? NOT LISTED  EKG FAXED TO [facility] Stacy Pham, Stacy Pham 08/21/2018, 2:51 PM  _________________________________________________________________   (provider comments below)

## 2018-08-22 DIAGNOSIS — Z992 Dependence on renal dialysis: Secondary | ICD-10-CM | POA: Diagnosis not present

## 2018-08-22 DIAGNOSIS — N186 End stage renal disease: Secondary | ICD-10-CM | POA: Diagnosis not present

## 2018-08-22 DIAGNOSIS — D509 Iron deficiency anemia, unspecified: Secondary | ICD-10-CM | POA: Diagnosis not present

## 2018-08-22 LAB — TYPE AND SCREEN
ABO/RH(D): B POS
Antibody Screen: NEGATIVE

## 2018-08-22 MED ORDER — AMIODARONE HCL 200 MG PO TABS: ORAL_TABLET | ORAL | 3 refills | Status: DC

## 2018-08-22 NOTE — Pre-Procedure Instructions (Signed)
Met B, CBC, & PT/PTT results sent to Dr. Delana Meyer and Anesthesia for review.

## 2018-08-22 NOTE — Telephone Encounter (Signed)
Gerald Stabs, you recently saw the patient and placed her on amiodarone. It appears patient was started on eliquis 08/04/2018. From your perspective, if patient ok to proceed with AVF on 08/30/2018. I see she converted to sinus rhythm by the time she saw you.   Clinical pharmacist to review how long to hold eliquis

## 2018-08-22 NOTE — Telephone Encounter (Signed)
Stacy Pham was rx as 200 bid x 2 wks.  Not sure why she took 400mg .  Rhythm mgmt is going to be important as her bp rarely tolerates metoprolol according to her dtr.  D/c metoprolol and resume Stacy Pham 200 bid x 2 wks as planned, followed by 200 daily.

## 2018-08-22 NOTE — Telephone Encounter (Signed)
Patient with diagnosis of afib on Eliquis for anticoagulation.    Procedure: AV Graft Date of procedure: 08/30/18  CHADS2-VASc score of  6 (CHF, HTN, AGE, DM2, stroke/tia x 2, CAD, AGE, female)  Patient can hold Eliquis for 3 days prior to procedure.

## 2018-08-22 NOTE — Telephone Encounter (Signed)
Spoke with patient's daughter, ok per DPR. She verbalized understanding to stop the metoprolol and start amiodarone 200 mg by mouth two times a day for 2 weeks and then 200 mg once a day. She will continue to monitor BP and HR and let us know if it is running too low.

## 2018-08-22 NOTE — Pre-Procedure Instructions (Signed)
UNABLE TO GET FAX TO GO THROUGH. LM FOR LAURA AT [facility] TO LOOK AT LABS

## 2018-08-23 DIAGNOSIS — N186 End stage renal disease: Secondary | ICD-10-CM | POA: Diagnosis not present

## 2018-08-23 DIAGNOSIS — D509 Iron deficiency anemia, unspecified: Secondary | ICD-10-CM | POA: Diagnosis not present

## 2018-08-23 DIAGNOSIS — Z992 Dependence on renal dialysis: Secondary | ICD-10-CM | POA: Diagnosis not present

## 2018-08-23 NOTE — Telephone Encounter (Signed)
   Primary Cardiologist: Nelva Bush, MD  Chart reviewed as part of pre-operative protocol coverage. Given past medical history and time since last visit, based on ACC/AHA guidelines, Stacy Pham would be at acceptable risk for the planned procedure without further cardiovascular testing. She does have a h/o paroxysmal atrial fibrillation and was recently placed on amiodarone therapy, which should be continued throughout the perioperative period.  Further, she is on Eliquis with a CHA2DS2VASc of 6.  She may hold her Eliquis beginning 3 days prior to the procedure with a plan to resume postoperatively once felt to be feasible from a surgical standpoint.  Please call with questions.  Murray Hodgkins, NP 08/23/2018, 9:15 AM

## 2018-08-24 DIAGNOSIS — Z992 Dependence on renal dialysis: Secondary | ICD-10-CM | POA: Diagnosis not present

## 2018-08-24 DIAGNOSIS — D509 Iron deficiency anemia, unspecified: Secondary | ICD-10-CM | POA: Diagnosis not present

## 2018-08-24 DIAGNOSIS — N186 End stage renal disease: Secondary | ICD-10-CM | POA: Diagnosis not present

## 2018-08-25 DIAGNOSIS — D509 Iron deficiency anemia, unspecified: Secondary | ICD-10-CM | POA: Diagnosis not present

## 2018-08-25 DIAGNOSIS — N186 End stage renal disease: Secondary | ICD-10-CM | POA: Diagnosis not present

## 2018-08-25 DIAGNOSIS — Z992 Dependence on renal dialysis: Secondary | ICD-10-CM | POA: Diagnosis not present

## 2018-08-26 DIAGNOSIS — D509 Iron deficiency anemia, unspecified: Secondary | ICD-10-CM | POA: Diagnosis not present

## 2018-08-26 DIAGNOSIS — Z992 Dependence on renal dialysis: Secondary | ICD-10-CM | POA: Diagnosis not present

## 2018-08-26 DIAGNOSIS — N186 End stage renal disease: Secondary | ICD-10-CM | POA: Diagnosis not present

## 2018-08-27 DIAGNOSIS — N186 End stage renal disease: Secondary | ICD-10-CM | POA: Diagnosis not present

## 2018-08-27 DIAGNOSIS — D509 Iron deficiency anemia, unspecified: Secondary | ICD-10-CM | POA: Diagnosis not present

## 2018-08-27 DIAGNOSIS — Z992 Dependence on renal dialysis: Secondary | ICD-10-CM | POA: Diagnosis not present

## 2018-08-28 DIAGNOSIS — Z992 Dependence on renal dialysis: Secondary | ICD-10-CM | POA: Diagnosis not present

## 2018-08-28 DIAGNOSIS — N186 End stage renal disease: Secondary | ICD-10-CM | POA: Diagnosis not present

## 2018-08-28 DIAGNOSIS — D509 Iron deficiency anemia, unspecified: Secondary | ICD-10-CM | POA: Diagnosis not present

## 2018-08-29 ENCOUNTER — Telehealth (INDEPENDENT_AMBULATORY_CARE_PROVIDER_SITE_OTHER): Payer: Self-pay

## 2018-08-29 ENCOUNTER — Encounter: Payer: Self-pay | Admitting: Internal Medicine

## 2018-08-29 DIAGNOSIS — N186 End stage renal disease: Secondary | ICD-10-CM | POA: Diagnosis not present

## 2018-08-29 DIAGNOSIS — D509 Iron deficiency anemia, unspecified: Secondary | ICD-10-CM | POA: Diagnosis not present

## 2018-08-29 DIAGNOSIS — Z992 Dependence on renal dialysis: Secondary | ICD-10-CM | POA: Diagnosis not present

## 2018-08-29 NOTE — Telephone Encounter (Signed)
Pt daughter is calling to see the status of pt taking Eliquis before her surgery. Please call and advise.

## 2018-08-29 NOTE — Telephone Encounter (Signed)
Spoke with patient's daughter, ok per DPR. She said she was waiting to here if she was to have patient stop her Eliquis prior to procedure. Procedure is tomorrow and patient has not stopped her Eliquis as they were unaware that was the plan. I apologized for this miscommunication. I advised her to call Dr Nino Parsley office to make them aware and to inquire on next steps. She was understanding though a bit frustrated that no one called her. I was apologetic.

## 2018-08-29 NOTE — Telephone Encounter (Signed)
Patient's daughter called wanting to know what to do about the patient's Eliquis. Patient is having surgery on 08/30/2018 and has taken one dose this morning. Per Hezzie Bump PA and Dr. Lucky Cowboy the patient should hold the second dose today and not take her morning dose. This recommendation was given to Mrs. Lovena Le the patient's daughter.

## 2018-08-29 NOTE — Telephone Encounter (Signed)
This encounter was created in error - please disregard.

## 2018-08-29 NOTE — Telephone Encounter (Signed)
Patient's daughter is concerned about stopping patient's Eliquis prior to the upcoming procedure rescheduled for March 4. SHe would like to know is there any reason or symptoms she should be aware of that would make her continue the Eliquis on March 1st when she would be to not give it prior to surgery. For example, if patient's HR is elevated above a certain amount or anything else.  She is afraid of something happening similarly to before when patient got to the procedure and she was in A. Fib and the cardiologist on call said patient needed to take the Eliquis immediately. Routing to Ignacia Bayley, NP for advice.

## 2018-08-29 NOTE — Telephone Encounter (Signed)
Patient daughter calling back to speak with Anderson Malta about Eliquis and upcoming procedure. She declined to give additional information as they have already been through so much .    Please call.

## 2018-08-30 DIAGNOSIS — D509 Iron deficiency anemia, unspecified: Secondary | ICD-10-CM | POA: Diagnosis not present

## 2018-08-30 DIAGNOSIS — Z992 Dependence on renal dialysis: Secondary | ICD-10-CM | POA: Diagnosis not present

## 2018-08-30 DIAGNOSIS — N186 End stage renal disease: Secondary | ICD-10-CM | POA: Diagnosis not present

## 2018-08-30 NOTE — Telephone Encounter (Signed)
Eliquis does not help her to maintain sinus rhythm.  The amiodarone helps with that.  The eliquis is a blood thinner and must be stopped so that the surgeons can do what they need to do.  She is to continue amiodarone throughout the perioperative period.  The reason for starting the blood thinner as soon as possible following the last time she was found to be in afib, was b/c we thought she might require a cardioversion prior to her next procedure, but she was back in sinus rhythm when I saw her in clinic.

## 2018-08-30 NOTE — Telephone Encounter (Signed)
Patient's surgery has been rescheduled to Wednesday 09/13/2018. Patient's surgery was canceled per Dr. Delana Meyer.

## 2018-08-31 DIAGNOSIS — Z992 Dependence on renal dialysis: Secondary | ICD-10-CM | POA: Diagnosis not present

## 2018-08-31 DIAGNOSIS — N186 End stage renal disease: Secondary | ICD-10-CM | POA: Diagnosis not present

## 2018-08-31 DIAGNOSIS — D509 Iron deficiency anemia, unspecified: Secondary | ICD-10-CM | POA: Diagnosis not present

## 2018-08-31 NOTE — Telephone Encounter (Signed)
Patient's daughter verbalized understanding of Gerald Stabs' recommendations and was appreciative.

## 2018-09-01 DIAGNOSIS — Z992 Dependence on renal dialysis: Secondary | ICD-10-CM | POA: Diagnosis not present

## 2018-09-01 DIAGNOSIS — N186 End stage renal disease: Secondary | ICD-10-CM | POA: Diagnosis not present

## 2018-09-01 DIAGNOSIS — D509 Iron deficiency anemia, unspecified: Secondary | ICD-10-CM | POA: Diagnosis not present

## 2018-09-02 DIAGNOSIS — N186 End stage renal disease: Secondary | ICD-10-CM | POA: Diagnosis not present

## 2018-09-02 DIAGNOSIS — Z992 Dependence on renal dialysis: Secondary | ICD-10-CM | POA: Diagnosis not present

## 2018-09-02 DIAGNOSIS — D509 Iron deficiency anemia, unspecified: Secondary | ICD-10-CM | POA: Diagnosis not present

## 2018-09-03 ENCOUNTER — Other Ambulatory Visit: Payer: Self-pay | Admitting: Family Medicine

## 2018-09-03 DIAGNOSIS — Z992 Dependence on renal dialysis: Principal | ICD-10-CM

## 2018-09-03 DIAGNOSIS — E1122 Type 2 diabetes mellitus with diabetic chronic kidney disease: Secondary | ICD-10-CM

## 2018-09-03 DIAGNOSIS — N186 End stage renal disease: Principal | ICD-10-CM

## 2018-09-03 DIAGNOSIS — D509 Iron deficiency anemia, unspecified: Secondary | ICD-10-CM | POA: Diagnosis not present

## 2018-09-03 NOTE — Progress Notes (Signed)
Labs entered for CPE.  

## 2018-09-04 DIAGNOSIS — D509 Iron deficiency anemia, unspecified: Secondary | ICD-10-CM | POA: Diagnosis not present

## 2018-09-04 DIAGNOSIS — Z992 Dependence on renal dialysis: Secondary | ICD-10-CM | POA: Diagnosis not present

## 2018-09-04 DIAGNOSIS — N186 End stage renal disease: Secondary | ICD-10-CM | POA: Diagnosis not present

## 2018-09-05 DIAGNOSIS — N186 End stage renal disease: Secondary | ICD-10-CM | POA: Diagnosis not present

## 2018-09-05 DIAGNOSIS — D509 Iron deficiency anemia, unspecified: Secondary | ICD-10-CM | POA: Diagnosis not present

## 2018-09-05 DIAGNOSIS — Z992 Dependence on renal dialysis: Secondary | ICD-10-CM | POA: Diagnosis not present

## 2018-09-06 DIAGNOSIS — Z992 Dependence on renal dialysis: Secondary | ICD-10-CM | POA: Diagnosis not present

## 2018-09-06 DIAGNOSIS — D509 Iron deficiency anemia, unspecified: Secondary | ICD-10-CM | POA: Diagnosis not present

## 2018-09-06 DIAGNOSIS — N186 End stage renal disease: Secondary | ICD-10-CM | POA: Diagnosis not present

## 2018-09-07 DIAGNOSIS — Z992 Dependence on renal dialysis: Secondary | ICD-10-CM | POA: Diagnosis not present

## 2018-09-07 DIAGNOSIS — N186 End stage renal disease: Secondary | ICD-10-CM | POA: Diagnosis not present

## 2018-09-07 DIAGNOSIS — D509 Iron deficiency anemia, unspecified: Secondary | ICD-10-CM | POA: Diagnosis not present

## 2018-09-08 ENCOUNTER — Ambulatory Visit (INDEPENDENT_AMBULATORY_CARE_PROVIDER_SITE_OTHER): Payer: Medicare Other | Admitting: Family Medicine

## 2018-09-08 ENCOUNTER — Encounter: Payer: Self-pay | Admitting: Family Medicine

## 2018-09-08 ENCOUNTER — Ambulatory Visit (INDEPENDENT_AMBULATORY_CARE_PROVIDER_SITE_OTHER): Payer: Medicare Other

## 2018-09-08 VITALS — BP 154/88 | HR 102 | Temp 97.5°F | Ht 63.0 in | Wt 190.7 lb

## 2018-09-08 DIAGNOSIS — I480 Paroxysmal atrial fibrillation: Secondary | ICD-10-CM | POA: Diagnosis not present

## 2018-09-08 DIAGNOSIS — M545 Low back pain, unspecified: Secondary | ICD-10-CM

## 2018-09-08 DIAGNOSIS — E1122 Type 2 diabetes mellitus with diabetic chronic kidney disease: Secondary | ICD-10-CM

## 2018-09-08 DIAGNOSIS — Z992 Dependence on renal dialysis: Secondary | ICD-10-CM | POA: Diagnosis not present

## 2018-09-08 DIAGNOSIS — N186 End stage renal disease: Secondary | ICD-10-CM

## 2018-09-08 DIAGNOSIS — Z0000 Encounter for general adult medical examination without abnormal findings: Secondary | ICD-10-CM

## 2018-09-08 DIAGNOSIS — D509 Iron deficiency anemia, unspecified: Secondary | ICD-10-CM | POA: Diagnosis not present

## 2018-09-08 DIAGNOSIS — G8929 Other chronic pain: Secondary | ICD-10-CM | POA: Diagnosis not present

## 2018-09-08 DIAGNOSIS — M792 Neuralgia and neuritis, unspecified: Secondary | ICD-10-CM | POA: Diagnosis not present

## 2018-09-08 DIAGNOSIS — M1A39X0 Chronic gout due to renal impairment, multiple sites, without tophus (tophi): Secondary | ICD-10-CM | POA: Diagnosis not present

## 2018-09-08 LAB — HEMOGLOBIN A1C: HEMOGLOBIN A1C: 6.7 % — AB (ref 4.6–6.5)

## 2018-09-08 MED ORDER — GABAPENTIN 100 MG PO CAPS: 200.0000 mg | ORAL_CAPSULE | Freq: Three times a day (TID) | ORAL | 4 refills | Status: DC

## 2018-09-08 MED ORDER — COLCHICINE 0.6 MG PO TABS: 0.6000 mg | ORAL_TABLET | Freq: Every day | ORAL | 1 refills | Status: DC | PRN

## 2018-09-08 MED ORDER — ALLOPURINOL 100 MG PO TABS: 100.0000 mg | ORAL_TABLET | Freq: Every day | ORAL | 3 refills | Status: DC

## 2018-09-08 MED ORDER — APIXABAN 2.5 MG PO TABS: 2.5000 mg | ORAL_TABLET | Freq: Two times a day (BID) | ORAL | 3 refills | Status: DC

## 2018-09-08 MED ORDER — ATORVASTATIN CALCIUM 20 MG PO TABS: 20.0000 mg | ORAL_TABLET | Freq: Every evening | ORAL | 1 refills | Status: DC

## 2018-09-08 NOTE — Progress Notes (Signed)
PCP notes:   Health maintenance:  Eye exam - addressed Tetanus vaccine - postponed/insurance PNA vaccine - pt declined A1C - completed Foot exam - completed  Abnormal screenings:   Hearing - failed  Hearing Screening   125Hz  250Hz  500Hz  1000Hz  2000Hz  3000Hz  4000Hz  6000Hz  8000Hz   Right ear:   0 0 40  40    Left ear:   40 40 40  40      Visual Acuity Screening   Right eye Left eye Both eyes  Without correction: 20/50 20/70 20/20   With correction:      Fall risk - hx of single fall  Fall Risk  09/08/2018 09/07/2017  Falls in the past year? 1 Yes  Comment - fell out of bed  Number falls in past yr: 0 1  Injury with Fall? 1 No   Mini-Cog score: 16/20  Hearing Screening   125Hz  250Hz  500Hz  1000Hz  2000Hz  3000Hz  4000Hz  6000Hz  8000Hz   Right ear:   0 0 40  40    Left ear:   40 40 40  40      Visual Acuity Screening   Right eye Left eye Both eyes  Without correction: 20/50 20/70 20/20   With correction:      Patient concerns:   Bilateral foot pain that increases when walking   Nurse concerns:  None  Next PCP appt:   09/08/18 @ 1030

## 2018-09-08 NOTE — Patient Instructions (Addendum)
Ms. Shannahan , Thank you for taking time to come for your Medicare Wellness Visit. I appreciate your ongoing commitment to your health goals. Please review the following plan we discussed and let me know if I can assist you in the future.   These are the goals we discussed: Goals    . Follow up with Primary Care Provider     Starting 09/08/2018, I will continue to take medications as prescribed and to keep appointments with PCP as scheduled.        This is a list of the screening recommended for you and due dates:  Health Maintenance  Topic Date Due  . Flu Shot  11/04/2018*  . Eye exam for diabetics  07/11/2020*  . Tetanus Vaccine  07/11/2020*  . Pneumonia vaccines (1 of 2 - PCV13) 07/11/2020*  . Hemoglobin A1C  03/09/2019  . Complete foot exam   09/09/2019  . DEXA scan (bone density measurement)  Completed  . Urine Protein Check  Discontinued  *Topic was postponed. The date shown is not the original due date.    Preventive Care for Adults  A healthy lifestyle and preventive care can promote health and wellness. Preventive health guidelines for adults include the following key practices.  . A routine yearly physical is a good way to check with your health care provider about your health and preventive screening. It is a chance to share any concerns and updates on your health and to receive a thorough exam.  . Visit your dentist for a routine exam and preventive care every 6 months. Brush your teeth twice a day and floss once a day. Good oral hygiene prevents tooth decay and gum disease.  . The frequency of eye exams is based on your age, health, family medical history, use  of contact lenses, and other factors. Follow your health care provider's recommendations for frequency of eye exams.  . Eat a healthy diet. Foods like vegetables, fruits, whole grains, low-fat dairy products, and lean protein foods contain the nutrients you need without too many calories. Decrease your intake of  foods high in solid fats, added sugars, and salt. Eat the right amount of calories for you. Get information about a proper diet from your health care provider, if necessary.  . Regular physical exercise is one of the most important things you can do for your health. Most adults should get at least 150 minutes of moderate-intensity exercise (any activity that increases your heart rate and causes you to sweat) each week. In addition, most adults need muscle-strengthening exercises on 2 or more days a week.  Silver Sneakers may be a benefit available to you. To determine eligibility, you may visit the website: www.silversneakers.com or contact program at 857-229-3356 Mon-Fri between 8AM-8PM.   . Maintain a healthy weight. The body mass index (BMI) is a screening tool to identify possible weight problems. It provides an estimate of body fat based on height and weight. Your health care provider can find your BMI and can help you achieve or maintain a healthy weight.   For adults 20 years and older: ? A BMI below 18.5 is considered underweight. ? A BMI of 18.5 to 24.9 is normal. ? A BMI of 25 to 29.9 is considered overweight. ? A BMI of 30 and above is considered obese.   . Maintain normal blood lipids and cholesterol levels by exercising and minimizing your intake of saturated fat. Eat a balanced diet with plenty of fruit and vegetables. Blood tests for  lipids and cholesterol should begin at age 58 and be repeated every 5 years. If your lipid or cholesterol levels are high, you are over 50, or you are at high risk for heart disease, you may need your cholesterol levels checked more frequently. Ongoing high lipid and cholesterol levels should be treated with medicines if diet and exercise are not working.  . If you smoke, find out from your health care provider how to quit. If you do not use tobacco, please do not start.  . If you choose to drink alcohol, please do not consume more than 2 drinks per  day. One drink is considered to be 12 ounces (355 mL) of beer, 5 ounces (148 mL) of wine, or 1.5 ounces (44 mL) of liquor.  . If you are 74-47 years old, ask your health care provider if you should take aspirin to prevent strokes.  . Use sunscreen. Apply sunscreen liberally and repeatedly throughout the day. You should seek shade when your shadow is shorter than you. Protect yourself by wearing long sleeves, pants, a wide-brimmed hat, and sunglasses year round, whenever you are outdoors.  . Once a month, do a whole body skin exam, using a mirror to look at the skin on your back. Tell your health care provider of new moles, moles that have irregular borders, moles that are larger than a pencil eraser, or moles that have changed in shape or color.

## 2018-09-08 NOTE — Patient Instructions (Addendum)
Good to see you today For pain/numnbess/tingling-  Take Tylenol 2 tablets twice a day If no improvement in a week or two, then start to increase the gabapentin- start with 2 at bedtime, then increase to 2 in the morning then 2 at lunch. Make a change and wait a week to go up again.   Follow up in 6 months

## 2018-09-08 NOTE — Progress Notes (Signed)
Subjective:    Patient ID: Stacy Pham, female    DOB: 03-Sep-1930, 85 y.o.   MRN: [id number]  HPI This is an 33 you female who presents today for follow up of chronic medical problems. She had AWV today.   ESRD- has been on dialysis x 2 years, most recently PD. Is going back to hemodialysis, awaiting catheter placement next week. Surgery has been postponed x 2 due to bronchitis and then afib.   DM type 2- has been well controlled off medication. Last hgba1c 03/17/18- 7.0.   Hard of hearing- listens to TV with volume loud. Able to converse on telephone.   Neuropathy/ back pain- some improvement with increasing acetaminophen to 2 tablets twice a day. Currently taking gabapentin 100 mg tid, saw some improvement at first, but now doesn't seem to be helping. She does stretching exercises. Little walking due to pain. Feet with intermittent numbness and tingling.   ROS- no chest pain, no SOB, no LE edema, no headaches  Past Medical History:  Diagnosis Date  . (HFpEF) heart failure with preserved ejection fraction ([place])    a. 10/2017 Echo: EF 60-65%, Gr1 DD, mild MR, mildly dil LA/RA. PASP 22mmHg.  . Arthritis   . Back pain   . Bronchitis   . Cataract   . Diabetes mellitus without complication ([place])   . Dialysis patient ([place])   . ESRD (end stage renal disease) ([place])    a. 2018 - initially on HD but then transitioned to nightly PD in 08/2017.  [name] Gout   . Hypertension   . PAF (paroxysmal atrial fibrillation) (HCC)    a. CHA2DS2VASc = 5-->eliquis 2.5 BID.   Past Surgical History:  Procedure Laterality Date  . CAPD INSERTION N/A 06/08/2017   Procedure: LAPAROSCOPIC INSERTION CONTINUOUS AMBULATORY PERITONEAL DIALYSIS  (CAPD) CATHETER;  Surgeon: Katha Cabal, MD;  Location: [facility];  Service: Vascular;  Laterality: N/A;  . CATARACT EXTRACTION, BILATERAL Bilateral   . DIALYSIS/PERMA CATHETER INSERTION N/A 03/18/2017   Procedure: DIALYSIS/PERMA CATHETER INSERTION;  Surgeon: Katha Cabal, MD;  Location: [facility];  Service: Cardiovascular;  Laterality: N/A;  . DIALYSIS/PERMA CATHETER REMOVAL N/A 10/20/2017   Procedure: DIALYSIS/PERMA CATHETER REMOVAL;  Surgeon: Algernon Huxley, MD;  Location: [facility];  Service: Cardiovascular;  Laterality: N/A;  . EYE SURGERY     Family History  Problem Relation Age of Onset  . Hypertension Father    Social History   Tobacco Use  . Smoking status: Never Smoker  . Smokeless tobacco: Never Used  Substance Use Topics  . Alcohol use: No  . Drug use: No      Review of Systems Per HPI    Objective:   Physical Exam Vitals signs reviewed.  Constitutional:      General: She is not in acute distress.    Appearance: Normal appearance. She is obese. She is not ill-appearing, toxic-appearing or diaphoretic.  HENT:     Head: Normocephalic and atraumatic.     Nose: Nose normal.     Mouth/Throat:     Mouth: Mucous membranes are moist.     Pharynx: Oropharynx is clear.  Eyes:     Conjunctiva/sclera: Conjunctivae normal.  Neck:     Musculoskeletal: Normal range of motion and neck supple. No neck rigidity or muscular tenderness.  Cardiovascular:     Rate and Rhythm: Normal rate and regular rhythm.     Heart sounds: Normal heart sounds.  Pulmonary:  Effort: Pulmonary effort is normal.     Breath sounds: Normal breath sounds.  Chest:     Breasts:        Right: Normal. No swelling, bleeding, inverted nipple, mass, nipple discharge, skin change or tenderness.        Left: Normal. No swelling, bleeding, inverted nipple, mass, nipple discharge, skin change or tenderness.  Lymphadenopathy:     Cervical: No cervical adenopathy.  Skin:    General: Skin is warm and dry.  Neurological:     Mental Status: She is alert and oriented to person, place, and time.  Psychiatric:        Mood and Affect: Mood normal.        Behavior: Behavior normal.        Thought Content: Thought content normal.        Judgment:  Judgment normal.       BP (!) 154/88 (BP Location: Left Arm, Patient Position: Sitting, Cuff Size: Normal)   Pulse (!) 102   Temp (!) 97.5 F (36.4 C) (Oral)   Ht 5\' 3"  (1.6 m) Comment: shoes  Wt 190 lb 11.2 oz (86.5 kg)   SpO2 99%   BMI 33.78 kg/m  Wt Readings from Last 3 Encounters:  09/08/18 190 lb 11.2 oz (86.5 kg)  09/08/18 190 lb 11.2 oz (86.5 kg)  08/21/18 194 lb (88 kg)   Diabetic Foot Exam - Simple   Simple Foot Form Diabetic Foot exam was performed with the following findings:  Yes 09/08/2018 11:14 AM  Visual Inspection See comments:  Yes Sensation Testing Intact to touch and monofilament testing bilaterally:  Yes Pulse Check Posterior Tibialis and Dorsalis pulse intact bilaterally:  Yes Comments Thickened great toenails bilaterally.  No skin breakdown. Feet warm to touch.  Bilateral toes with hyperpigmentation.         Assessment & Plan:  1. Type 2 diabetes mellitus with chronic kidney disease on chronic dialysis, without long-term current use of insulin (HCC) - hgba1c - atorvastatin (LIPITOR) 20 MG tablet; Take 1 tablet (20 mg total) by mouth every evening.  Dispense: 90 tablet; Refill: 1  2. Neuropathic pain - will start with regular acetaminophen dosing and then increase gabapentin if needed - gabapentin (NEURONTIN) 100 MG capsule; Take 2 capsules (200 mg total) by mouth 3 (three) times daily.  Dispense: 360 capsule; Refill: 4  3. Chronic bilateral low back pain without sciatica - encouraged continued stretching - see #2  4. ESRD (end stage renal disease) ([place]) - she is scheduled for surgery for access to go back to HD  5. Paroxysmal atrial fibrillation (HCC) - continue current meds and cardiology follow up - apixaban (ELIQUIS) 2.5 MG TABS tablet; Take 1 tablet (2.5 mg total) by mouth 2 (two) times daily.  Dispense: 180 tablet; Refill: 3 - metoprolol tartrate (LOPRESSOR) 25 MG tablet  6. Chronic gout due to renal impairment of multiple sites  without tophus - allopurinol (ZYLOPRIM) 100 MG tablet; Take 1 tablet (100 mg total) by mouth at bedtime.  Dispense: 90 tablet; Refill: 3 - colchicine 0.6 MG tablet; Take 1 tablet (0.6 mg total) by mouth daily as needed (for gout flare ups).  Dispense: 10 tablet; Refill: 1  - follow up in 6 months  Clarene Reamer, FNP-BC  [facility], [place]  09/08/2018 5:43 PM

## 2018-09-08 NOTE — Progress Notes (Signed)
Subjective:   Stacy Pham is a 85 y.o. female who presents for Medicare Annual (Subsequent) preventive examination.  Review of Systems:  N/A Cardiac Risk Factors include: advanced age (>57mn, >>11women);obesity (BMI >30kg/m2);diabetes mellitus;dyslipidemia;hypertension     Objective:     Vitals: BP (!) 154/88 (BP Location: Left Arm, Patient Position: Sitting, Cuff Size: Normal)   Pulse (!) 102   Temp (!) 97.5 F (36.4 C) (Oral)   Ht '5\' 3"'  (1.6 m) Comment: shoes  Wt 190 lb 11.2 oz (86.5 kg)   SpO2 99%   BMI 33.78 kg/m   Body mass index is 33.78 kg/m.  Advanced Directives 09/08/2018 09/08/2018 08/21/2018 07/21/2018 02/21/2018 10/20/2017 09/07/2017  Does Patient Have a Medical Advance Directive? Yes No No No No No No  Type of [place] - - - - - -  Would patient like information on creating a medical advance directive? No - Patient declined No - Patient declined No - Patient declined No - Patient declined - No - Patient declined Yes (MAU/Ambulatory/Procedural Areas - Information given)    Tobacco Social History   Tobacco Use  Smoking Status Never Smoker  Smokeless Tobacco Never Used     Counseling given: No   Clinical Intake:  Pre-visit preparation completed: Yes  Pain : No/denies pain Pain Score: 0-No pain(pain when walking)     Nutritional Status: BMI 25 -29 Overweight Nutritional Risks: None Diabetes: Yes CBG done?: No Did pt. bring in CBG monitor from home?: No  How often do you need to have someone help you when you read instructions, pamphlets, or other written materials from your doctor or pharmacy?: 1 - Never What is the last grade level you completed in school?: educated in [place] Interpreter Needed?: No  Comments: pt lives with daughter Information entered by :: LPinson, LPN  Past Medical History:  Diagnosis Date  . (HFpEF) heart failure with preserved ejection fraction ([place]    a. 10/2017 Echo:  EF 60-65%, Gr1 DD, mild MR, mildly dil LA/RA. PASP 431mg.  . Arthritis   . Back pain   . Bronchitis   . Cataract   . Diabetes mellitus without complication ([facility]  . [place] ([facility]  . [place] ([place] stage renal disease) ([facility]   a. 2018 - initially on HD but then transitioned to nightly PD in 08/2017.  . Marland Kitchenout   . Hypertension   . PAF (paroxysmal atrial fibrillation) (HCC)    a. CHA2DS2VASc = 5-->eliquis 2.5 BID.   Past Surgical History:  Procedure Laterality Date  . CAPD INSERTION N/A 06/08/2017   Procedure: LAPAROSCOPIC INSERTION CONTINUOUS AMBULATORY PERITONEAL DIALYSIS  (CAPD) CATHETER;  Surgeon: ScKatha CabalMD;  Location: [facility] ORS;  Service: Vascular;  Laterality: N/A;  . CATARACT EXTRACTION, BILATERAL Bilateral   . DIALYSIS/PERMA CATHETER INSERTION N/A 03/18/2017   Procedure: DIALYSIS/PERMA CATHETER INSERTION;  Surgeon: ScKatha CabalMD;  [facility];  Service: Cardiovascular;  Laterality: N/A;  . DIALYSIS/PERMA CATHETER REMOVAL N/A 10/20/2017   Procedure: DIALYSIS/PERMA CATHETER REMOVAL;  Surgeon: DeAlgernon HuxleyMD;  [facility];  Service: Cardiovascular;  Laterality: N/A;  . EYE SURGERY     Family History  Problem Relation Age of Onset  . Hypertension Father    Social History   Socioeconomic History  . Marital status: Single    Spouse name: Not on file  . Number of children: Not on file  . Years of education:  Not on file  . Highest education level: Not on file  Occupational History  . Not on file  Social Needs  . Financial resource strain: Not on file  . Food insecurity:    Worry: Not on file    Inability: Not on file  . Transportation needs:    Medical: Not on file    Non-medical: Not on file  Tobacco Use  . Smoking status: Never Smoker  . Smokeless tobacco: Never Used  Substance and Sexual Activity  . Alcohol use: No  . Drug use: No  . Sexual activity: Not on file  Lifestyle  . Physical activity:     Days per week: Not on file    Minutes per session: Not on file  . Stress: Not on file  Relationships  . Social connections:    Talks on phone: Not on file    Gets together: Not on file    Attends religious service: Not on file    Active member of club or organization: Not on file    Attends meetings of clubs or organizations: Not on file    Relationship status: Not on file  Other Topics Concern  . Not on file  Social History Narrative   Retired. Lives in [place] with her dtr who takes care of her.  Pt uses a walker to get around, though admits that ambulation over all is limited.    Outpatient Encounter Medications as of 09/08/2018  Medication Sig  . acetaminophen (TYLENOL 8 HOUR ARTHRITIS PAIN) 650 MG CR tablet Take 650 mg every 8 (eight) hours as needed by mouth for pain.  [name] amiodarone (PACERONE) 200 MG tablet Take 1 tablet (200 mg) by mouth two times a day for 2 weeks, then decrease to 1 tablet (200 mg) by mouth once a day.  . blood glucose meter kit and supplies KIT Please dispense either One Touch or Bayer Contour She must have one of these machines since she is on peritoneal dialysis. Use up to four times daily as directed. (FOR ICD-9 250.00, 250.01).  . calcitRIOL (ROCALTROL) 0.25 MCG capsule Take 0.25 mcg by mouth daily.  . cinacalcet (SENSIPAR) 30 MG tablet Take 1 tablet (30 mg total) by mouth daily with supper. (Patient taking differently: Take 30 mg by mouth daily with supper. With largest meal)  . ferric citrate (AURYXIA) 1 GM 210 MG(Fe) tablet Take 210 mg by mouth 3 (three) times daily with meals.  [name] glucose blood (ONETOUCH VERIO) test strip Test blood sugar four times daily. Dx 250.00, 250.01, E11.22, N18.6  . hydroxypropyl methylcellulose / hypromellose (ISOPTO TEARS / GONIOVISC) 2.5 % ophthalmic solution Place 1-2 drops 3 (three) times daily as needed into both eyes for dry eyes.  Glory Rosebush DELICA LANCETS 70W MISC Test blood sugar four times daily. Dx 250.00, 250.01,  E11.22, N18.6  . [DISCONTINUED] allopurinol (ZYLOPRIM) 100 MG tablet TAKE 1 TABLET BY MOUTH ONCE DAILY (Patient taking differently: Take 100 mg by mouth at bedtime. )  . [DISCONTINUED] apixaban (ELIQUIS) 2.5 MG TABS tablet Take 1 tablet (2.5 mg total) by mouth 2 (two) times daily.  . [DISCONTINUED] atorvastatin (LIPITOR) 20 MG tablet Take 1 tablet (20 mg total) by mouth every evening.  . [DISCONTINUED] colchicine 0.6 MG tablet Take 1 tablet (0.6 mg total) by mouth daily as needed (for gout flare ups).  . [DISCONTINUED] gabapentin (NEURONTIN) 100 MG capsule Take 1 capsule (100 mg total) by mouth 3 (three) times daily.   No facility-administered encounter medications on  file as of 09/08/2018.     Activities of Daily Living In your present state of health, do you have any difficulty performing the following activities: 09/08/2018 08/21/2018  Hearing? N N  Vision? N N  Difficulty concentrating or making decisions? N N  Walking or climbing stairs? Y Y  Dressing or bathing? N Y  Doing errands, shopping? Tempie Donning  Preparing Food and eating ? Y -  Comment daughter prepares meals -  Using the Toilet? N -  In the past six months, have you accidently leaked urine? N -  Do you have problems with loss of bowel control? N -  Managing your Medications? Y -  Managing your Finances? Y -  Housekeeping or managing your Housekeeping? Y -  Some recent data might be hidden    Patient Care Team: Elby Beck, FNP as PCP - General (Nurse Practitioner) End, Harrell Gave, MD as PCP - Cardiology (Cardiology)    Assessment:   This is a routine wellness examination for Shakyra.   Hearing Screening   '125Hz'  '250Hz'  '500Hz'  '1000Hz'  '2000Hz'  '3000Hz'  '4000Hz'  '6000Hz'  '8000Hz'   Right ear:   0 0 40  40    Left ear:   40 40 40  40      Visual Acuity Screening   Right eye Left eye Both eyes  Without correction: '20/50 20/70 20/20 '  With correction:      Exercise Activities and Dietary recommendations Current Exercise Habits:  The patient does not participate in regular exercise at present, Exercise limited by: Other - see comments;orthopedic condition(s)(dialysis)  Goals    . Follow up with Primary Care Provider     Starting 09/08/2018, I will continue to take medications as prescribed and to keep appointments with PCP as scheduled.        Fall Risk Fall Risk  09/08/2018 09/07/2017  Falls in the past year? 1 Yes  Comment - fell out of bed  Number falls in past yr: 0 1  Injury with Fall? 1 No   Depression Screen PHQ 2/9 Scores 09/08/2018 09/07/2017 05/18/2017  PHQ - 2 Score 0 0 0  PHQ- 9 Score 0 0 -     Cognitive Function MMSE - Mini Mental State Exam 09/08/2018 09/07/2017  Orientation to time 5 5  Orientation to Place 5 5  Registration 3 3  Attention/ Calculation 0 0  Recall 2 1  Recall-comments unable to recall 1 of 3 words unable to recall 1 of 3 words  Language- name 2 objects 0 0  Language- repeat 1 1  Language- follow 3 step command 0 2  Language- follow 3 step command-comments unable to follow 3 steps of 3 step command unable to follow 1 step of 3 step command  Language- read & follow direction 0 0  Write a sentence 0 0  Copy design 0 0  Total score 16 17     PLEASE NOTE: A Mini-Cog screen was completed. Maximum score is 20. A value of 0 denotes this part of Folstein MMSE was not completed or the patient failed this part of the Mini-Cog screening.   Mini-Cog Screening Orientation to Time - Max 5 pts Orientation to Place - Max 5 pts Registration - Max 3 pts Recall - Max 3 pts Language Repeat - Max 1 pts Language Follow 3 Step Command - Max 3 pts      There is no immunization history on file for this patient.  Screening Tests Health Maintenance  Topic Date Due  .  INFLUENZA VACCINE  11/04/2018 (Originally 02/09/2018)  . OPHTHALMOLOGY EXAM  07/11/2020 (Originally 10/27/1940)  . TETANUS/TDAP  07/11/2020 (Originally 10/27/1949)  . PNA vac Low Risk Adult (1 of 2 - PCV13) 07/11/2020  (Originally 10/26/1997)  . HEMOGLOBIN A1C  03/09/2019  . FOOT EXAM  09/09/2019  . DEXA SCAN  Completed  . URINE MICROALBUMIN  Discontinued      Plan:     I have personally reviewed, addressed, and noted the following in the patient's chart:  A. Medical and social history B. Use of alcohol, tobacco or illicit drugs  C. Current medications and supplements D. Functional ability and status E.  Nutritional status F.  Physical activity G. Advance directives H. List of other physicians I.  Hospitalizations, surgeries, and ER visits in previous 12 months J.  [place] to include hearing, vision, cognitive, depression L. Referrals and appointments - none  In addition, I have reviewed and discussed with patient certain preventive protocols, quality metrics, and best practice recommendations. A written personalized care plan for preventive services as well as general preventive health recommendations were provided to patient.  See attached scanned questionnaire for additional information.   Signed,   Lindell Noe, MHA, BS, LPN Health Coach

## 2018-09-08 NOTE — Progress Notes (Signed)
PCP notes:   Health maintenance:  Foot exam needed per health maintenance  Abnormal screenings:   Hearing - failed  Hearing Screening   125Hz  250Hz  500Hz  1000Hz  2000Hz  3000Hz  4000Hz  6000Hz  8000Hz   Right ear:   0 0 40  40    Left ear:   40 40 40  40      Visual Acuity Screening   Right eye Left eye Both eyes  Without correction: 20/50 20/70 20/20   With correction:      Mini-Cog score: 16/20 MMSE - Mini Mental State Exam 09/08/2018 09/07/2017  Orientation to time 5 5  Orientation to Place 5 5  Registration 3 3  Attention/ Calculation 0 0  Recall 2 1  Recall-comments unable to recall 1 of 3 words unable to recall 1 of 3 words  Language- name 2 objects 0 0  Language- repeat 1 1  Language- follow 3 step command 0 2  Language- follow 3 step command-comments unable to follow 3 steps of 3 step command unable to follow 1 step of 3 step command  Language- read & follow direction 0 0  Write a sentence 0 0  Copy design 0 0  Total score 16 17   Patient concerns:   Pain in bilateral feet when walking  Nurse concerns:  BP elevated. States she cannot take BP medication. Ate salty luncheon meat for breakfast per patient report.

## 2018-09-09 DIAGNOSIS — N186 End stage renal disease: Secondary | ICD-10-CM | POA: Diagnosis not present

## 2018-09-09 DIAGNOSIS — Z992 Dependence on renal dialysis: Secondary | ICD-10-CM | POA: Diagnosis not present

## 2018-09-09 DIAGNOSIS — D509 Iron deficiency anemia, unspecified: Secondary | ICD-10-CM | POA: Diagnosis not present

## 2018-09-10 DIAGNOSIS — N186 End stage renal disease: Secondary | ICD-10-CM | POA: Diagnosis not present

## 2018-09-10 DIAGNOSIS — Z992 Dependence on renal dialysis: Secondary | ICD-10-CM | POA: Diagnosis not present

## 2018-09-10 NOTE — Progress Notes (Signed)
I reviewed health advisor's note, was available for consultation, and agree with documentation and plan.  

## 2018-09-11 DIAGNOSIS — N186 End stage renal disease: Secondary | ICD-10-CM | POA: Diagnosis not present

## 2018-09-11 DIAGNOSIS — Z992 Dependence on renal dialysis: Secondary | ICD-10-CM | POA: Diagnosis not present

## 2018-09-12 DIAGNOSIS — Z992 Dependence on renal dialysis: Secondary | ICD-10-CM | POA: Diagnosis not present

## 2018-09-12 DIAGNOSIS — N186 End stage renal disease: Secondary | ICD-10-CM | POA: Diagnosis not present

## 2018-09-13 ENCOUNTER — Other Ambulatory Visit: Payer: Self-pay

## 2018-09-13 ENCOUNTER — Ambulatory Visit
Admission: RE | Admit: 2018-09-13 | Discharge: 2018-09-13 | Disposition: A | Discharge status: Home / Self Care | Payer: Medicare Other | Attending: Vascular Surgery | Admitting: Vascular Surgery

## 2018-09-13 ENCOUNTER — Encounter
Admission: RE | Disposition: A | Discharge status: Home / Self Care | Payer: Self-pay | Source: Home / Self Care | Attending: Vascular Surgery

## 2018-09-13 ENCOUNTER — Ambulatory Visit: Payer: Medicare Other | Admitting: Anesthesiology

## 2018-09-13 DIAGNOSIS — Z992 Dependence on renal dialysis: Secondary | ICD-10-CM | POA: Diagnosis not present

## 2018-09-13 DIAGNOSIS — E785 Hyperlipidemia, unspecified: Secondary | ICD-10-CM | POA: Insufficient documentation

## 2018-09-13 DIAGNOSIS — Z8249 Family history of ischemic heart disease and other diseases of the circulatory system: Secondary | ICD-10-CM | POA: Diagnosis not present

## 2018-09-13 DIAGNOSIS — Z7901 Long term (current) use of anticoagulants: Secondary | ICD-10-CM | POA: Insufficient documentation

## 2018-09-13 DIAGNOSIS — I132 Hypertensive heart and chronic kidney disease with heart failure and with stage 5 chronic kidney disease, or end stage renal disease: Secondary | ICD-10-CM | POA: Insufficient documentation

## 2018-09-13 DIAGNOSIS — Z9842 Cataract extraction status, left eye: Secondary | ICD-10-CM | POA: Insufficient documentation

## 2018-09-13 DIAGNOSIS — Z9841 Cataract extraction status, right eye: Secondary | ICD-10-CM | POA: Insufficient documentation

## 2018-09-13 DIAGNOSIS — M1990 Unspecified osteoarthritis, unspecified site: Secondary | ICD-10-CM | POA: Insufficient documentation

## 2018-09-13 DIAGNOSIS — E1122 Type 2 diabetes mellitus with diabetic chronic kidney disease: Secondary | ICD-10-CM | POA: Diagnosis not present

## 2018-09-13 DIAGNOSIS — I480 Paroxysmal atrial fibrillation: Secondary | ICD-10-CM | POA: Insufficient documentation

## 2018-09-13 DIAGNOSIS — M109 Gout, unspecified: Secondary | ICD-10-CM | POA: Insufficient documentation

## 2018-09-13 DIAGNOSIS — N186 End stage renal disease: Secondary | ICD-10-CM | POA: Insufficient documentation

## 2018-09-13 DIAGNOSIS — Z79899 Other long term (current) drug therapy: Secondary | ICD-10-CM | POA: Insufficient documentation

## 2018-09-13 DIAGNOSIS — I5032 Chronic diastolic (congestive) heart failure: Secondary | ICD-10-CM | POA: Diagnosis not present

## 2018-09-13 DIAGNOSIS — I509 Heart failure, unspecified: Secondary | ICD-10-CM | POA: Diagnosis not present

## 2018-09-13 HISTORY — PX: AV FISTULA PLACEMENT: SHX1204

## 2018-09-13 LAB — POCT I-STAT 4, (NA,K, GLUC, HGB,HCT)
GLUCOSE: 127 mg/dL — AB (ref 70–99)
HCT: 33 % — ABNORMAL LOW (ref 36.0–46.0)
Hemoglobin: 11.2 g/dL — ABNORMAL LOW (ref 12.0–15.0)
POTASSIUM: 4.2 mmol/L (ref 3.5–5.1)
Sodium: 134 mmol/L — ABNORMAL LOW (ref 135–145)

## 2018-09-13 LAB — GLUCOSE, CAPILLARY: Glucose-Capillary: 97 mg/dL (ref 70–99)

## 2018-09-13 SURGERY — INSERTION OF ARTERIOVENOUS (AV) GORE-TEX GRAFT ARM
Anesthesia: General | Site: Arm Upper | Laterality: Right

## 2018-09-13 MED ORDER — SODIUM CHLORIDE 0.9 % IV SOLN
INTRAVENOUS | Status: DC | PRN
  Administered 2018-09-13: 20 ug/min via INTRAVENOUS

## 2018-09-13 MED ORDER — SODIUM CHLORIDE 0.9 % IV SOLN
INTRAVENOUS | Status: DC | PRN
  Administered 2018-09-13: 50 mL via INTRAMUSCULAR

## 2018-09-13 MED ORDER — CEFAZOLIN SODIUM-DEXTROSE 1-4 GM/50ML-% IV SOLN
INTRAVENOUS | Status: AC
  Filled 2018-09-13: qty 50

## 2018-09-13 MED ORDER — FENTANYL CITRATE (PF) 100 MCG/2ML IJ SOLN
INTRAMUSCULAR | Status: DC | PRN
  Administered 2018-09-13 (×2): 25 ug via INTRAVENOUS

## 2018-09-13 MED ORDER — FENTANYL CITRATE (PF) 100 MCG/2ML IJ SOLN
INTRAMUSCULAR | Status: AC
  Filled 2018-09-13: qty 2

## 2018-09-13 MED ORDER — CEFAZOLIN SODIUM-DEXTROSE 2-4 GM/100ML-% IV SOLN
INTRAVENOUS | Status: AC
  Filled 2018-09-13: qty 100

## 2018-09-13 MED ORDER — FENTANYL CITRATE (PF) 100 MCG/2ML IJ SOLN: 25.0000 ug | INTRAMUSCULAR | Status: DC | PRN

## 2018-09-13 MED ORDER — HYDROCODONE-ACETAMINOPHEN 5-325 MG PO TABS
ORAL_TABLET | ORAL | Status: AC
  Administered 2018-09-13: 1
  Filled 2018-09-13: qty 1

## 2018-09-13 MED ORDER — FAMOTIDINE 20 MG PO TABS
20.0000 mg | ORAL_TABLET | Freq: Once | ORAL | Status: AC
  Administered 2018-09-13: 20 mg via ORAL

## 2018-09-13 MED ORDER — HEPARIN SODIUM (PORCINE) 5000 UNIT/ML IJ SOLN
INTRAMUSCULAR | Status: AC
  Filled 2018-09-13: qty 1

## 2018-09-13 MED ORDER — PHENYLEPHRINE HCL 10 MG/ML IJ SOLN
INTRAMUSCULAR | Status: DC | PRN
  Administered 2018-09-13 (×5): 200 ug via INTRAVENOUS
  Administered 2018-09-13: 100 ug via INTRAVENOUS

## 2018-09-13 MED ORDER — ONDANSETRON HCL 4 MG/2ML IJ SOLN: 4.0000 mg | Freq: Once | INTRAMUSCULAR | Status: DC | PRN

## 2018-09-13 MED ORDER — FAMOTIDINE 20 MG PO TABS
ORAL_TABLET | ORAL | Status: AC
  Administered 2018-09-13: 20 mg via ORAL
  Filled 2018-09-13: qty 1

## 2018-09-13 MED ORDER — BUPIVACAINE HCL (PF) 0.5 % IJ SOLN
INTRAMUSCULAR | Status: AC
  Filled 2018-09-13: qty 30

## 2018-09-13 MED ORDER — LIDOCAINE HCL (CARDIAC) PF 100 MG/5ML IV SOSY
PREFILLED_SYRINGE | INTRAVENOUS | Status: DC | PRN
  Administered 2018-09-13: 80 mg via INTRAVENOUS

## 2018-09-13 MED ORDER — ROCURONIUM BROMIDE 50 MG/5ML IV SOLN
INTRAVENOUS | Status: AC
  Filled 2018-09-13: qty 1

## 2018-09-13 MED ORDER — PROPOFOL 10 MG/ML IV BOLUS
INTRAVENOUS | Status: AC
  Filled 2018-09-13: qty 20

## 2018-09-13 MED ORDER — ONDANSETRON HCL 4 MG/2ML IJ SOLN
INTRAMUSCULAR | Status: DC | PRN
  Administered 2018-09-13: 4 mg via INTRAVENOUS

## 2018-09-13 MED ORDER — HYDROCODONE-ACETAMINOPHEN 5-325 MG PO TABS: 1.0000 | ORAL_TABLET | Freq: Four times a day (QID) | ORAL | 0 refills | Status: DC | PRN

## 2018-09-13 MED ORDER — CHLORHEXIDINE GLUCONATE CLOTH 2 % EX PADS: 6.0000 | MEDICATED_PAD | Freq: Once | CUTANEOUS | Status: DC

## 2018-09-13 MED ORDER — SODIUM CHLORIDE 0.9 % IV SOLN
INTRAVENOUS | Status: DC
  Administered 2018-09-13: 14:00:00 via INTRAVENOUS

## 2018-09-13 MED ORDER — LIDOCAINE HCL (PF) 2 % IJ SOLN
INTRAMUSCULAR | Status: AC
  Filled 2018-09-13: qty 10

## 2018-09-13 MED ORDER — PROPOFOL 10 MG/ML IV BOLUS
INTRAVENOUS | Status: DC | PRN
  Administered 2018-09-13: 100 mg via INTRAVENOUS
  Administered 2018-09-13: 20 mg via INTRAVENOUS
  Administered 2018-09-13: 30 mg via INTRAVENOUS

## 2018-09-13 MED ORDER — BUPIVACAINE HCL (PF) 0.5 % IJ SOLN
INTRAMUSCULAR | Status: DC | PRN
  Administered 2018-09-13: 16 mL

## 2018-09-13 MED ORDER — CEFAZOLIN SODIUM-DEXTROSE 1-4 GM/50ML-% IV SOLN
1.0000 g | INTRAVENOUS | Status: AC
  Administered 2018-09-13: 1 g via INTRAVENOUS

## 2018-09-13 SURGICAL SUPPLY — 62 items
APPLIER CLIP 11 MED OPEN (CLIP)
APPLIER CLIP 9.375 SM OPEN (CLIP)
BAG COUNTER SPONGE EZ (MISCELLANEOUS) ×2 IMPLANT
BAG DECANTER FOR FLEXI CONT (MISCELLANEOUS) ×3 IMPLANT
BLADE SURG SZ11 CARB STEEL (BLADE) ×3 IMPLANT
BOOT SUTURE AID YELLOW STND (SUTURE) ×3 IMPLANT
BRUSH SCRUB EZ  4% CHG (MISCELLANEOUS) ×2
BRUSH SCRUB EZ 4% CHG (MISCELLANEOUS) ×1 IMPLANT
CANISTER SUCT 1200ML W/VALVE (MISCELLANEOUS) ×3 IMPLANT
CHLORAPREP W/TINT 26ML (MISCELLANEOUS) ×9 IMPLANT
CLIP APPLIE 11 MED OPEN (CLIP) IMPLANT
CLIP APPLIE 9.375 SM OPEN (CLIP) IMPLANT
COUNTER SPONGE BAG EZ (MISCELLANEOUS) ×1
COVER WAND RF STERILE (DRAPES) IMPLANT
DERMABOND ADVANCED (GAUZE/BANDAGES/DRESSINGS) ×2
DERMABOND ADVANCED .7 DNX12 (GAUZE/BANDAGES/DRESSINGS) ×1 IMPLANT
DRESSING SURGICEL FIBRLLR 1X2 (HEMOSTASIS) ×1 IMPLANT
DRSG SURGICEL FIBRILLAR 1X2 (HEMOSTASIS) ×3
ELECT CAUTERY BLADE 6.4 (BLADE) ×3 IMPLANT
ELECT REM PT RETURN 9FT ADLT (ELECTROSURGICAL) ×3
ELECTRODE REM PT RTRN 9FT ADLT (ELECTROSURGICAL) ×1 IMPLANT
GLOVE BIO SURGEON STRL SZ 6.5 (GLOVE) ×2 IMPLANT
GLOVE BIO SURGEON STRL SZ7 (GLOVE) ×3 IMPLANT
GLOVE BIO SURGEONS STRL SZ 6.5 (GLOVE) ×1
GLOVE INDICATOR 7.5 STRL GRN (GLOVE) ×3 IMPLANT
GLOVE SURG SYN 8.0 (GLOVE) ×3 IMPLANT
GOWN STRL REUS W/ TWL LRG LVL3 (GOWN DISPOSABLE) ×2 IMPLANT
GOWN STRL REUS W/ TWL XL LVL3 (GOWN DISPOSABLE) ×1 IMPLANT
GOWN STRL REUS W/TWL LRG LVL3 (GOWN DISPOSABLE) ×4
GOWN STRL REUS W/TWL XL LVL3 (GOWN DISPOSABLE) ×2
GRAFT PROPATEN STD WALL 4 7X45 (Vascular Products) ×3 IMPLANT
IV NS 500ML (IV SOLUTION) ×2
IV NS 500ML BAXH (IV SOLUTION) ×1 IMPLANT
KIT TURNOVER KIT A (KITS) ×3 IMPLANT
LABEL OR SOLS (LABEL) ×3 IMPLANT
LOOP RED MAXI  1X406MM (MISCELLANEOUS) ×2
LOOP VESSEL MAXI 1X406 RED (MISCELLANEOUS) ×1 IMPLANT
LOOP VESSEL MINI 0.8X406 BLUE (MISCELLANEOUS) ×2 IMPLANT
LOOPS BLUE MINI 0.8X406MM (MISCELLANEOUS) ×4
NEEDLE FILTER BLUNT 18X 1/2SAF (NEEDLE) ×2
NEEDLE FILTER BLUNT 18X1 1/2 (NEEDLE) ×1 IMPLANT
NS IRRIG 500ML POUR BTL (IV SOLUTION) ×3 IMPLANT
PACK EXTREMITY ARMC (MISCELLANEOUS) ×3 IMPLANT
PAD PREP 24X41 OB/GYN DISP (PERSONAL CARE ITEMS) ×3 IMPLANT
PUNCH SURGICAL ROTATE 2.7MM (MISCELLANEOUS) IMPLANT
STOCKINETTE STRL 4IN 9604848 (GAUZE/BANDAGES/DRESSINGS) ×3 IMPLANT
SUT GTX CV-5 TTC13 DBL (SUTURE) ×3 IMPLANT
SUT GTX CV-6 30 (SUTURE) ×6 IMPLANT
SUT MNCRL+ 5-0 UNDYED PC-3 (SUTURE) ×1 IMPLANT
SUT MONOCRYL 5-0 (SUTURE) ×2
SUT PROLENE 6 0 BV (SUTURE) ×6 IMPLANT
SUT SILK 2 0 (SUTURE) ×2
SUT SILK 2 0 SH (SUTURE) ×3 IMPLANT
SUT SILK 2-0 18XBRD TIE 12 (SUTURE) ×1 IMPLANT
SUT SILK 3 0 (SUTURE) ×2
SUT SILK 3-0 18XBRD TIE 12 (SUTURE) ×1 IMPLANT
SUT SILK 4 0 (SUTURE) ×2
SUT SILK 4-0 18XBRD TIE 12 (SUTURE) ×1 IMPLANT
SUT VIC AB 3-0 SH 27 (SUTURE) ×4
SUT VIC AB 3-0 SH 27X BRD (SUTURE) ×2 IMPLANT
SYR 20CC LL (SYRINGE) ×3 IMPLANT
SYR 3ML LL SCALE MARK (SYRINGE) ×3 IMPLANT

## 2018-09-13 NOTE — Anesthesia Preprocedure Evaluation (Addendum)
Anesthesia Evaluation  Patient identified by MRN, date of birth, ID band Patient awake    Reviewed: Allergy & Precautions, NPO status , Patient's Chart, lab work & pertinent test results  History of Anesthesia Complications Negative for: history of anesthetic complications  Airway Mallampati: III       Dental  (+) Upper Dentures, Lower Dentures   Pulmonary neg sleep apnea, neg COPD,           Cardiovascular hypertension, Pt. on medications and Pt. on home beta blockers +CHF  (-) Past MI + dysrhythmias Atrial Fibrillation (-) Valvular Problems/Murmurs     Neuro/Psych neg Seizures    GI/Hepatic Neg liver ROS, neg GERD  ,  Endo/Other  diabetes  Renal/GU ESRF and DialysisRenal disease     Musculoskeletal   Abdominal   Peds  Hematology   Anesthesia Other Findings Past Medical History: No date: (HFpEF) heart failure with preserved ejection fraction ([place])     Comment:  a. 10/2017 Echo: EF 60-65%, Gr1 DD, mild MR, mildly dil               LA/RA. PASP 9mmHg. No date: Arthritis No date: Back pain No date: Bronchitis No date: Cataract No date: Diabetes mellitus without complication (HCC) No date: Dialysis patient [facility]) No date: ESRD (end stage renal disease) ([place])     Comment:  a. 2018 - initially on HD but then transitioned to               nightly PD in 08/2017. No date: Gout No date: Hypertension No date: PAF (paroxysmal atrial fibrillation) (HCC)     Comment:  a. CHA2DS2VASc = 5-->eliquis 2.5 BID.  Reproductive/Obstetrics                            Anesthesia Physical  Anesthesia Plan  ASA: IV  Anesthesia Plan: General   Post-op Pain Management:    Induction:   PONV Risk Score and Plan: 3 and Dexamethasone, Ondansetron, Midazolam and Treatment may vary due to age or medical condition  Airway Management Planned: Oral ETT and LMA  Additional Equipment:   Intra-op Plan:    Post-operative Plan: Extubation in OR  Informed Consent: I have reviewed the patients History and Physical, chart, labs and discussed the procedure including the risks, benefits and alternatives for the proposed anesthesia with the patient or authorized representative who has indicated his/her understanding and acceptance.       Plan Discussed with: CRNA and Surgeon  Anesthesia Plan Comments:        Anesthesia Quick Evaluation

## 2018-09-13 NOTE — Anesthesia Postprocedure Evaluation (Signed)
Anesthesia Post Note  Patient: Stacy Pham  Procedure(s) Performed: INSERTION OF ARTERIOVENOUS (AV) GORE-TEX GRAFT ARM (Right Arm Upper)  Patient location during evaluation: PACU Anesthesia Type: General Level of consciousness: awake and alert Pain management: pain level controlled Vital Signs Assessment: post-procedure vital signs reviewed and stable Respiratory status: spontaneous breathing, nonlabored ventilation, respiratory function stable and patient connected to nasal cannula oxygen Cardiovascular status: blood pressure returned to baseline and stable Postop Assessment: no apparent nausea or vomiting Anesthetic complications: no     Last Vitals:  Vitals:   09/13/18 1800 09/13/18 1805  BP:  (!) 124/52  Pulse: 76 77  Resp:    Temp:  (!) 36.1 C  SpO2: 93% 95%    Last Pain:  Vitals:   09/13/18 1800  PainSc: [place]

## 2018-09-13 NOTE — Op Note (Signed)
OPERATIVE NOTE   PROCEDURE: right brachial axillary arteriovenous graft placement  PRE-OPERATIVE DIAGNOSIS: End Stage Renal Disease  POST-OPERATIVE DIAGNOSIS: End Stage Renal Disease  SURGEON: Hortencia Pilar  ASSISTANT(S): None  ANESTHESIA: general  ESTIMATED BLOOD LOSS: <50 cc  FINDING(S): 4 mm brachial artery with a 8 mm axillary vein  SPECIMEN(S):  none  INDICATIONS:   Stacy Pham is a 85 y.o. female who presents with end stage renal disease.  The patient is scheduled for right brachial axillary AV graft placement.  The patient is aware the risks include but are not limited to: bleeding, infection, steal syndrome, nerve damage, ischemic monomelic neuropathy, failure to mature, and need for additional procedures.  The patient is aware of the risks of the procedure and elects to proceed forward.  DESCRIPTION: After full informed written consent was obtained from the patient, the patient was brought back to the operating room and placed supine upon the operating table.  Prior to induction, the patient received IV antibiotics.   After obtaining adequate anesthesia, the patient was then prepped and draped in the standard fashion for a right arm access procedure.   A first assistant was required to provide a safe and appropriate environment for executing the surgery.  The assistant was integral in providing retraction, exposure, running suture providing suction and in the closing process.   A linear incision was then created along the medial border of the biceps muscle just proximal to the antecubital crease and the brachial artery which was exposed through. The brachial artery was then looped proximally and distally with Silastic Vesseloops. Side branches were controlled with 4-0 silk ties.  Attention was then turned to the exposure of the axillary vein. Linear incision was then created medial to the proximal portion of the biceps at the level of the anterior axillary  crease. The axillary vein was exposed and again looped proximally and distally with Silastic vessel loops. Associated tributaries were also controlled with Silastic Vesseloops.  The Gore tunneler was then delivered onto the field and a subcutaneous path was made from the arterial incision to the venous incision. A 4-7 tapered PTFE propatent graft by Simeon Craft was then pulled through the subcutaneous tunnel. The arterial 4 mm portion was then approximated to the brachial artery. Brachial artery was controlled proximally and distally with the Silastic Vesseloops. Arteriotomy was made with an 11 blade scalpel and extended with Potts scissors and a 6-0 Prolene stay suture was placed. End graft to side brachial artery anastomosis was then fashioned with running CV 6 suture. Flushing maneuvers were performed suture line was hemostatic and the graft was then assessed for proper position and ease of future cannulation. Heparinized saline was infused into the vein and the graft was clamped with a vascular clamp. With the graft pressurized it was approximated to the axillary vein in its native bed and then marked with a surgical marker. The vein was then delivered into the surgical field and controlled with the Silastic vessel loops. Venotomy was then made with an 11 blade scalpel and extended with Potts scissors and a 6-0 Prolene suture was used as stay suture. The the graft was then sewn to the vein in an end graft to side vein fashion using running CV 6 suture.  Flushing maneuvers were performed and the artery was allowed to forward and back bleed.  Flow was then established through the AV graft  There was good  thrill in the venous outflow, and there was 1+ palpable radial pulse.  At this point, I irrigated out the surgical wounds.  There was no further active bleeding.  The subcutaneous tissue was reapproximated with a running stitch of 3-0 Vicryl.  The skin was then reapproximated with a running subcuticular stitch of  4-0 Vicryl.  The skin was then cleaned, dried, and reinforced with Dermabond.    The patient tolerated this procedure well.   COMPLICATIONS: None  CONDITION: Stacy Pham [facility]  Office: [phone]   09/13/2018, 5:08 PM

## 2018-09-13 NOTE — H&P (Signed)
[facility] Admission History & Physical  MRN : [id number]  Stacy Pham is a 85 y.o. (12-11-1930) female who presents with chief complaint of No chief complaint on file. [name]  History of Present Illness:  The patient presents to [facility] today for creation of an upper extremity dialysis access. Currently the patient is maintained via acatheter.Previously, she had presented to [facility] January 24 for the same operation but was found to be in atrial fib with rapid ventricular response and this was new.  Because of this new diagnosis her surgery was postponed.  She has done well since that time.  The patient has chronic shoulder and elbow pain on the left is been going on for many years and even though she is right-handed feels that a right sided access would be better.  The patient denies redness or swelling at the access site. The patient denies fever or chills at home or while on dialysis.  The patient denies amaurosis fugax or recent TIA symptoms. There are no recent neurological changes noted. The patient denies claudication symptoms or rest pain symptoms. The patient denies history of DVT, PE or superficial thrombophlebitis.  The patient denies recent episodes of angina or shortness of breath.  Vein mapping bilateral upper extremities demonstrates a 3.2 mm cephalic vein at the antecubital fossa bilaterally.   Current Facility-Administered Medications  Medication Dose Route Frequency Provider Last Rate Last Dose  . 0.9 %  sodium chloride infusion   Intravenous Continuous Piscitello, Precious Haws, MD      . ceFAZolin (ANCEF) 1-4 GM/50ML-% IVPB           . ceFAZolin (ANCEF) IVPB 1 g/50 mL premix  1 g Intravenous On Call to Zihlman, [place], NP      . Chlorhexidine Gluconate Cloth 2 % PADS 6 each  6 each Topical Once Kris Hartmann, NP       And  . Chlorhexidine Gluconate Cloth 2 % PADS 6 each  6 each Topical Once Kris Hartmann, NP        Past Medical  History:  Diagnosis Date  . (HFpEF) heart failure with preserved ejection fraction ([place])    a. 10/2017 Echo: EF 60-65%, Gr1 DD, mild MR, mildly dil LA/RA. PASP 57mmHg.  . Arthritis   . Back pain   . Bronchitis   . Cataract   . Diabetes mellitus without complication ([place])   . Dialysis patient ([place])   . ESRD (end stage renal disease) ([place])    a. 2018 - initially on HD but then transitioned to nightly PD in 08/2017.  [name] Gout   . Hypertension   . PAF (paroxysmal atrial fibrillation) (HCC)    a. CHA2DS2VASc = 5-->eliquis 2.5 BID.    Past Surgical History:  Procedure Laterality Date  . CAPD INSERTION N/A 06/08/2017   Procedure: LAPAROSCOPIC INSERTION CONTINUOUS AMBULATORY PERITONEAL DIALYSIS  (CAPD) CATHETER;  Surgeon: Katha Cabal, MD;  Location: [facility] ORS;  Service: Vascular;  Laterality: N/A;  . CATARACT EXTRACTION, BILATERAL Bilateral   . DIALYSIS/PERMA CATHETER INSERTION N/A 03/18/2017   Procedure: DIALYSIS/PERMA CATHETER INSERTION;  Surgeon: Katha Cabal, MD;  Location: [place] [facility];  Service: Cardiovascular;  Laterality: N/A;  . DIALYSIS/PERMA CATHETER REMOVAL N/A 10/20/2017   Procedure: DIALYSIS/PERMA CATHETER REMOVAL;  Surgeon: Algernon Huxley, MD;  Location: [place] [facility];  Service: Cardiovascular;  Laterality: N/A;  . EYE SURGERY      Social History Social History   Tobacco Use  .  Smoking status: Never Smoker  . Smokeless tobacco: Never Used  Substance Use Topics  . Alcohol use: No  . Drug use: No    Family History Family History  Problem Relation Age of Onset  . Hypertension Father     No family history of bleeding or clotting disorders, autoimmune disease or porphyria  No Known Allergies   REVIEW OF SYSTEMS (Negative unless checked)  Constitutional: [] Weight loss  [] Fever  [] Chills Cardiac: [] Chest pain   [] Chest pressure   [] Palpitations   [] Shortness of breath when laying flat   [] Shortness of breath at rest   [x] Shortness of breath with  exertion. Vascular:  [] Pain in legs with walking   [] Pain in legs at rest   [] Pain in legs when laying flat   [] Claudication   [] Pain in feet when walking  [] Pain in feet at rest  [] Pain in feet when laying flat   [] History of DVT   [] Phlebitis   [] Swelling in legs   [] Varicose veins   [] Non-healing ulcers Pulmonary:   [] Uses home oxygen   [] Productive cough   [] Hemoptysis   [] Wheeze  [] COPD   [] Asthma Neurologic:  [] Dizziness  [] Blackouts   [] Seizures   [] History of stroke   [] History of TIA  [] Aphasia   [] Temporary blindness   [] Dysphagia   [] Weakness or numbness in arms   [] Weakness or numbness in legs Musculoskeletal:  [] Arthritis   [] Joint swelling   [] Joint pain   [] Low back pain Hematologic:  [] Easy bruising  [] Easy bleeding   [] Hypercoagulable state   [] Anemic  [] Hepatitis Gastrointestinal:  [] Blood in stool   [] Vomiting blood  [] Gastroesophageal reflux/heartburn   [] Difficulty swallowing. Genitourinary:  [x] Chronic kidney disease   [] Difficult urination  [] Frequent urination  [] Burning with urination   [] Blood in urine Skin:  [] Rashes   [] Ulcers   [] Wounds Psychological:  [] History of anxiety   []  History of major depression.  Physical Examination  Vitals:   09/13/18 1153  BP: 140/71  Pulse: 92  Resp: 18  Temp: (!) 97.4 F (36.3 C)  SpO2: 96%  Weight: 86.5 kg  Height: 5\' 3"  (1.6 m)   Body mass index is 33.78 kg/m. Gen: WD/WN, NAD Head: [place]/AT, No temporalis wasting. Prominent temp pulse not noted. Ear/Nose/Throat: Hearing grossly intact, nares w/o erythema or drainage, oropharynx w/o Erythema/Exudate,  Eyes: Conjunctiva clear, sclera non-icteric Neck: Trachea midline.  No JVD.  Pulmonary:  Good air movement, respirations not labored, no use of accessory muscles.  Cardiac: RRR, normal S1, S2. Vascular: Tunneled catheter clean dry and intact Vessel Right Left  Radial Palpable Palpable  Ulnar Not Palpable Not Palpable  Brachial Palpable Palpable  Carotid Palpable, without  bruit Palpable, without bruit  Gastrointestinal: soft, non-tender/non-distended. No guarding/reflex.  Musculoskeletal: M/S 5/5 throughout.  Extremities without ischemic changes.  No deformity or atrophy.  Neurologic: Sensation grossly intact in extremities.  Symmetrical.  Speech is fluent. Motor exam as listed above. Psychiatric: Judgment intact, Mood & affect appropriate for pt's clinical situation. Dermatologic: No rashes or ulcers noted.  No cellulitis or open wounds. Lymph : No Cervical, Axillary, or Inguinal lymphadenopathy.   CBC Lab Results  Component Value Date   WBC 10.9 (H) 08/21/2018   HGB 11.2 (L) 09/13/2018   HCT 33.0 (L) 09/13/2018   MCV 104.2 (H) 08/21/2018   PLT 367 08/21/2018    BMET    Component Value Date/Time   NA 134 (L) 09/13/2018 1150   K 4.2 09/13/2018 1150   CL 92 (L)  08/21/2018 1320   CO2 28 08/21/2018 1320   GLUCOSE 127 (H) 09/13/2018 1150   BUN 38 (H) 08/21/2018 1320   CREATININE 12.23 (H) 08/21/2018 1320   CALCIUM 7.8 (L) 08/21/2018 1320   GFRNONAA 2 (L) 08/21/2018 1320   GFRAA 3 (L) 08/21/2018 1320   CrCl cannot be calculated (Patient's most recent lab result is older than the maximum 21 days allowed.).  COAG Lab Results  Component Value Date   INR 1.45 08/21/2018   INR 1.43 06/16/2018   INR 1.08 06/08/2017    Radiology No results found.  Assessment/Plan 1. ESRD (end stage renal disease) ([place]) Recommend:  At this time the patient does not have appropriate extremity access for dialysis  Patient should have aright brachial axillary graftcreated.Possibility of a fistula creation was also discussed but the patient and her daughter refused fistula.  The risks, benefits and alternative therapies were reviewed in detail with the patient. All questions were answered. The patient agrees to proceed with surgery.    2. Hyperlipidemia, unspecified hyperlipidemia type Continue statin as ordered and reviewed, no changes at this  time   3. Type 2 diabetes mellitus with chronic kidney disease on chronic dialysis, without long-term current use of insulin (HCC) Continue hypoglycemic medications as already ordered, these medications have been reviewed and there are no changes at this time.  Hgb A1C to be monitored as already arranged by primary service   4. Hypertension, unspecified type Continue antihypertensive medications as already ordered, these medications have been reviewed and there are no changes at this time.   Hortencia Pilar, MD  09/13/2018 2:08 PM

## 2018-09-13 NOTE — Transfer of Care (Signed)
Immediate Anesthesia Transfer of Care Note  Patient: Stacy Pham  Procedure(s) Performed: INSERTION OF ARTERIOVENOUS (AV) GORE-TEX GRAFT ARM (Right Arm Upper)  Patient Location: PACU  Anesthesia Type:General  Level of Consciousness: sedated  Airway & Oxygen Therapy: Patient Spontanous Breathing and Patient connected to face mask oxygen  Post-op Assessment: Report given to RN and Post -op Vital signs reviewed and stable  Post vital signs: Reviewed and stable  Last Vitals:  Vitals Value Taken Time  BP 124/66 09/13/2018  5:09 PM  Temp    Pulse 72 09/13/2018  5:13 PM  Resp 18 09/13/2018  5:13 PM  SpO2 100 % 09/13/2018  5:13 PM  Vitals shown include unvalidated device data.  Last Pain:  Vitals:   09/13/18 1153  PainSc: 0-No pain         Complications: No apparent anesthesia complications

## 2018-09-13 NOTE — Anesthesia Procedure Notes (Signed)
Procedure Name: LMA Insertion Date/Time: 09/13/2018 3:07 PM Performed by: Hedda Slade, CRNA Pre-anesthesia Checklist: Patient identified, Patient being monitored, Timeout performed, Emergency Drugs available and Suction available Patient Re-evaluated:Patient Re-evaluated prior to induction Oxygen Delivery Method: Circle system utilized Preoxygenation: Pre-oxygenation with 100% oxygen Induction Type: IV induction Ventilation: Mask ventilation without difficulty LMA: LMA inserted LMA Size: 3.5 Tube type: Oral Number of attempts: 1 Placement Confirmation: positive ETCO2 and breath sounds checked- equal and bilateral Tube secured with: Tape Dental Injury: Teeth and Oropharynx as per pre-operative assessment

## 2018-09-13 NOTE — Addendum Note (Signed)
Addendum  created 09/13/18 1251 by Alvin Critchley, MD   Clinical Note Signed

## 2018-09-13 NOTE — Anesthesia Post-op Follow-up Note (Signed)
Anesthesia QCDR form completed.        

## 2018-09-13 NOTE — Progress Notes (Signed)
Care assumed. Report received from Charline Bills, RN. Pt awaiting ride home.

## 2018-09-14 ENCOUNTER — Encounter: Payer: Self-pay | Admitting: Vascular Surgery

## 2018-09-14 DIAGNOSIS — Z992 Dependence on renal dialysis: Secondary | ICD-10-CM | POA: Diagnosis not present

## 2018-09-14 DIAGNOSIS — N186 End stage renal disease: Secondary | ICD-10-CM | POA: Diagnosis not present

## 2018-09-15 ENCOUNTER — Telehealth: Payer: Self-pay

## 2018-09-15 ENCOUNTER — Telehealth (INDEPENDENT_AMBULATORY_CARE_PROVIDER_SITE_OTHER): Payer: Self-pay | Admitting: Vascular Surgery

## 2018-09-15 DIAGNOSIS — Z743 Need for continuous supervision: Secondary | ICD-10-CM | POA: Diagnosis not present

## 2018-09-15 DIAGNOSIS — I5032 Chronic diastolic (congestive) heart failure: Secondary | ICD-10-CM | POA: Diagnosis not present

## 2018-09-15 DIAGNOSIS — E1122 Type 2 diabetes mellitus with diabetic chronic kidney disease: Secondary | ICD-10-CM | POA: Diagnosis not present

## 2018-09-15 DIAGNOSIS — I132 Hypertensive heart and chronic kidney disease with heart failure and with stage 5 chronic kidney disease, or end stage renal disease: Secondary | ICD-10-CM | POA: Diagnosis not present

## 2018-09-15 DIAGNOSIS — Z48812 Encounter for surgical aftercare following surgery on the circulatory system: Secondary | ICD-10-CM | POA: Diagnosis not present

## 2018-09-15 DIAGNOSIS — N185 Chronic kidney disease, stage 5: Secondary | ICD-10-CM | POA: Diagnosis not present

## 2018-09-15 DIAGNOSIS — Z992 Dependence on renal dialysis: Secondary | ICD-10-CM | POA: Diagnosis not present

## 2018-09-15 DIAGNOSIS — I480 Paroxysmal atrial fibrillation: Secondary | ICD-10-CM | POA: Diagnosis not present

## 2018-09-15 DIAGNOSIS — N186 End stage renal disease: Secondary | ICD-10-CM | POA: Diagnosis not present

## 2018-09-15 DIAGNOSIS — Z7901 Long term (current) use of anticoagulants: Secondary | ICD-10-CM | POA: Diagnosis not present

## 2018-09-15 NOTE — Telephone Encounter (Signed)
Home health nurse calling because patient is having a hard time affording Eliquis. Patient is paying more that $400 a month.  Home health nurse wants to add Medical social work but needs verbal order.  Please advise. AS, CMA  [place] [phone]  Spoke with [employer]..he said this was fine. If they were unable to help patient can go back to [place].  Called back [facility] nurse and advised. AS, CMA

## 2018-09-15 NOTE — Telephone Encounter (Signed)
Patient was at the hospital for dialysis port placement. Patient advised to follow up with vascular specialist and also has an appointment with cardiologist coming up. No TCM call done.  If we need to reach out to the patient still let me know. Thank you.

## 2018-09-16 DIAGNOSIS — N186 End stage renal disease: Secondary | ICD-10-CM | POA: Diagnosis not present

## 2018-09-16 DIAGNOSIS — Z992 Dependence on renal dialysis: Secondary | ICD-10-CM | POA: Diagnosis not present

## 2018-09-17 DIAGNOSIS — Z992 Dependence on renal dialysis: Secondary | ICD-10-CM | POA: Diagnosis not present

## 2018-09-17 DIAGNOSIS — N186 End stage renal disease: Secondary | ICD-10-CM | POA: Diagnosis not present

## 2018-09-18 DIAGNOSIS — Z992 Dependence on renal dialysis: Secondary | ICD-10-CM | POA: Diagnosis not present

## 2018-09-18 DIAGNOSIS — N186 End stage renal disease: Secondary | ICD-10-CM | POA: Diagnosis not present

## 2018-09-19 DIAGNOSIS — I480 Paroxysmal atrial fibrillation: Secondary | ICD-10-CM | POA: Diagnosis not present

## 2018-09-19 DIAGNOSIS — N186 End stage renal disease: Secondary | ICD-10-CM | POA: Diagnosis not present

## 2018-09-19 DIAGNOSIS — N185 Chronic kidney disease, stage 5: Secondary | ICD-10-CM | POA: Diagnosis not present

## 2018-09-19 DIAGNOSIS — Z992 Dependence on renal dialysis: Secondary | ICD-10-CM | POA: Diagnosis not present

## 2018-09-19 DIAGNOSIS — I132 Hypertensive heart and chronic kidney disease with heart failure and with stage 5 chronic kidney disease, or end stage renal disease: Secondary | ICD-10-CM | POA: Diagnosis not present

## 2018-09-19 DIAGNOSIS — E1122 Type 2 diabetes mellitus with diabetic chronic kidney disease: Secondary | ICD-10-CM | POA: Diagnosis not present

## 2018-09-19 DIAGNOSIS — Z48812 Encounter for surgical aftercare following surgery on the circulatory system: Secondary | ICD-10-CM | POA: Diagnosis not present

## 2018-09-19 DIAGNOSIS — I5032 Chronic diastolic (congestive) heart failure: Secondary | ICD-10-CM | POA: Diagnosis not present

## 2018-09-20 DIAGNOSIS — I5032 Chronic diastolic (congestive) heart failure: Secondary | ICD-10-CM | POA: Diagnosis not present

## 2018-09-20 DIAGNOSIS — N185 Chronic kidney disease, stage 5: Secondary | ICD-10-CM | POA: Diagnosis not present

## 2018-09-20 DIAGNOSIS — E1122 Type 2 diabetes mellitus with diabetic chronic kidney disease: Secondary | ICD-10-CM | POA: Diagnosis not present

## 2018-09-20 DIAGNOSIS — I480 Paroxysmal atrial fibrillation: Secondary | ICD-10-CM | POA: Diagnosis not present

## 2018-09-20 DIAGNOSIS — I132 Hypertensive heart and chronic kidney disease with heart failure and with stage 5 chronic kidney disease, or end stage renal disease: Secondary | ICD-10-CM | POA: Diagnosis not present

## 2018-09-20 DIAGNOSIS — Z992 Dependence on renal dialysis: Secondary | ICD-10-CM | POA: Diagnosis not present

## 2018-09-20 DIAGNOSIS — Z48812 Encounter for surgical aftercare following surgery on the circulatory system: Secondary | ICD-10-CM | POA: Diagnosis not present

## 2018-09-20 DIAGNOSIS — N186 End stage renal disease: Secondary | ICD-10-CM | POA: Diagnosis not present

## 2018-09-21 DIAGNOSIS — Z48812 Encounter for surgical aftercare following surgery on the circulatory system: Secondary | ICD-10-CM | POA: Diagnosis not present

## 2018-09-21 DIAGNOSIS — I480 Paroxysmal atrial fibrillation: Secondary | ICD-10-CM | POA: Diagnosis not present

## 2018-09-21 DIAGNOSIS — I5032 Chronic diastolic (congestive) heart failure: Secondary | ICD-10-CM | POA: Diagnosis not present

## 2018-09-21 DIAGNOSIS — Z992 Dependence on renal dialysis: Secondary | ICD-10-CM | POA: Diagnosis not present

## 2018-09-21 DIAGNOSIS — E1122 Type 2 diabetes mellitus with diabetic chronic kidney disease: Secondary | ICD-10-CM | POA: Diagnosis not present

## 2018-09-21 DIAGNOSIS — I132 Hypertensive heart and chronic kidney disease with heart failure and with stage 5 chronic kidney disease, or end stage renal disease: Secondary | ICD-10-CM | POA: Diagnosis not present

## 2018-09-21 DIAGNOSIS — N186 End stage renal disease: Secondary | ICD-10-CM | POA: Diagnosis not present

## 2018-09-21 DIAGNOSIS — N185 Chronic kidney disease, stage 5: Secondary | ICD-10-CM | POA: Diagnosis not present

## 2018-09-22 ENCOUNTER — Telehealth: Payer: Self-pay | Admitting: Internal Medicine

## 2018-09-22 DIAGNOSIS — Z992 Dependence on renal dialysis: Secondary | ICD-10-CM | POA: Diagnosis not present

## 2018-09-22 DIAGNOSIS — N186 End stage renal disease: Secondary | ICD-10-CM | POA: Diagnosis not present

## 2018-09-22 NOTE — Telephone Encounter (Signed)
Patient daughter calling  Patient is currently taking Eliquis but will be out after today Went to pick up prescription but it is ~$400 Patient daughter wants to reconsider warfarin option Please call to discuss, would like to know something ASAP as patient is out of Eliquis after today

## 2018-09-22 NOTE — Telephone Encounter (Signed)
Spoke with the pt daughter [name]. Robesha sts that she would not be able to get to our office before 5pm to pick up the sampled of Eliquis. Adv her to contact the pt pharmacy to see if they are able to purchase a 3 day supply of Eliquis. That way the pt will be covered until they are able to get to our office on Monday.Robeshsa sts that she will do that. Adv her that an update has been fwd to Dr.End about switching to Coumadin. We will give her a call back with his response and recommendation. Robesha voiced appreciation for the assistance.

## 2018-09-22 NOTE — Telephone Encounter (Signed)
Returned the pt daughter's call x2 lmom x2 Samples of Eliquis will be left at the front desk for pick up. Message routed to Dr.End and his nurse Anderson [place], RN about the patient's readiness to switch from Eliquis to Couamdin due to cost.  Medication Samples have been provided to the patient.  Drug name: Eliquis   Strength: 2.5mg       Qty:2 boxes LOT: [id number]  Exp.Date: 08/2019  Dosing instructions: 1 tabl;et by mouth twice daily

## 2018-09-23 DIAGNOSIS — Z992 Dependence on renal dialysis: Secondary | ICD-10-CM | POA: Diagnosis not present

## 2018-09-23 DIAGNOSIS — N186 End stage renal disease: Secondary | ICD-10-CM | POA: Diagnosis not present

## 2018-09-24 DIAGNOSIS — N186 End stage renal disease: Secondary | ICD-10-CM | POA: Diagnosis not present

## 2018-09-24 DIAGNOSIS — Z992 Dependence on renal dialysis: Secondary | ICD-10-CM | POA: Diagnosis not present

## 2018-09-24 NOTE — Telephone Encounter (Signed)
I will contact the patient and her daughter by phone to discuss pros and cons as well as logistics of switching to warfarin.  If they are agreeable, we will need to arrange for Stacy Pham to become established in the anticoagulation clinic in [place] for ongoing management.  Nelva Bush, MD [facility] Pager: [phone]

## 2018-09-25 DIAGNOSIS — Z992 Dependence on renal dialysis: Secondary | ICD-10-CM | POA: Diagnosis not present

## 2018-09-25 DIAGNOSIS — N186 End stage renal disease: Secondary | ICD-10-CM | POA: Diagnosis not present

## 2018-09-25 NOTE — Telephone Encounter (Signed)
I spoke with Ms. Heyward's daughter, [name], regarding apixaban versus warfarin going forward.  The apixaban is considerably more expensive, they wish to stay with this for now in order to avoid frequent INR checks.  They will contact us if they wish to transition to warfarin with the assistance of our anticoagulation clinic.  Nelva Bush, MD [facility] Pager: [phone]

## 2018-09-26 ENCOUNTER — Telehealth: Payer: Self-pay

## 2018-09-26 DIAGNOSIS — N186 End stage renal disease: Secondary | ICD-10-CM | POA: Diagnosis not present

## 2018-09-26 DIAGNOSIS — Z992 Dependence on renal dialysis: Secondary | ICD-10-CM | POA: Diagnosis not present

## 2018-09-26 NOTE — Telephone Encounter (Signed)
Call attempted, [facility]

## 2018-09-26 NOTE — Telephone Encounter (Signed)
Attempted call to patient, tried number on file, which belongs to pts daughter. She gave me patients number [phone]. Call went straight to VM. LMTCB so gauze level of OV needed.

## 2018-09-27 ENCOUNTER — Telehealth (INDEPENDENT_AMBULATORY_CARE_PROVIDER_SITE_OTHER): Payer: Self-pay | Admitting: Vascular Surgery

## 2018-09-27 DIAGNOSIS — Z992 Dependence on renal dialysis: Secondary | ICD-10-CM | POA: Diagnosis not present

## 2018-09-27 DIAGNOSIS — N186 End stage renal disease: Secondary | ICD-10-CM | POA: Diagnosis not present

## 2018-09-27 NOTE — Telephone Encounter (Signed)
Call to patient to reschedule in regards to COVID-19 restrictions.  ?  Pt is comfortable with rescheduling at this time and denies any new, urgent or worsening sx.  ?  Message routed to cv div c19 pool for rescheduling.    COVID-19 Pre-Screening:  1. Have you been in contact with someone who was sick? no 2. Do you have any of the following symptoms (cough, fever, muscle pain, vomiting, diarrhea, weakness abdominal pain, rash, red eye, bruising or bleeding, joint pain, severe headache)?  no 3. Have you travelled internationally or out of state in the last month?  No  4. Do you need any refills at this time? no  Patient aware of the following: Please be advised that we require, no one but yourself to come to appointment. If necessary, only one visitor may come with you into the building. They will also be asked the same screening questions. You will be contacted at a later time to reschedule. However, this will depend on ongoing evaluation of the Covid-19 situation.  Please call us if any new questions or concerns arise. We are here for advice.  Routing to COVID cancel pool.

## 2018-09-28 ENCOUNTER — Ambulatory Visit: Payer: Medicare Other | Admitting: Cardiovascular Disease

## 2018-09-28 ENCOUNTER — Ambulatory Visit: Payer: Medicare Other | Admitting: Nurse Practitioner

## 2018-09-28 DIAGNOSIS — N186 End stage renal disease: Secondary | ICD-10-CM | POA: Diagnosis not present

## 2018-09-28 DIAGNOSIS — Z992 Dependence on renal dialysis: Secondary | ICD-10-CM | POA: Diagnosis not present

## 2018-09-29 ENCOUNTER — Telehealth (INDEPENDENT_AMBULATORY_CARE_PROVIDER_SITE_OTHER): Payer: Self-pay | Admitting: Vascular Surgery

## 2018-09-29 DIAGNOSIS — N186 End stage renal disease: Secondary | ICD-10-CM | POA: Diagnosis not present

## 2018-09-29 DIAGNOSIS — Z992 Dependence on renal dialysis: Secondary | ICD-10-CM | POA: Diagnosis not present

## 2018-09-29 NOTE — Telephone Encounter (Signed)
Ben-PT with [facility] calling to state that patient decline PT today due to fear of COVID19. Patient does not want to reschedule until after the [place] is gone. AS, CMA

## 2018-09-30 DIAGNOSIS — N186 End stage renal disease: Secondary | ICD-10-CM | POA: Diagnosis not present

## 2018-09-30 DIAGNOSIS — Z992 Dependence on renal dialysis: Secondary | ICD-10-CM | POA: Diagnosis not present

## 2018-10-01 DIAGNOSIS — N186 End stage renal disease: Secondary | ICD-10-CM | POA: Diagnosis not present

## 2018-10-01 DIAGNOSIS — Z992 Dependence on renal dialysis: Secondary | ICD-10-CM | POA: Diagnosis not present

## 2018-10-02 DIAGNOSIS — Z992 Dependence on renal dialysis: Secondary | ICD-10-CM | POA: Diagnosis not present

## 2018-10-02 DIAGNOSIS — N186 End stage renal disease: Secondary | ICD-10-CM | POA: Diagnosis not present

## 2018-10-02 NOTE — Telephone Encounter (Signed)
Patient is aware with the provider to attempt to contact regarding her questions

## 2018-10-02 NOTE — Telephone Encounter (Signed)
Dr. Delana Meyer is aware and will attempt to contact her regarding her questions.

## 2018-10-03 DIAGNOSIS — Z992 Dependence on renal dialysis: Secondary | ICD-10-CM | POA: Diagnosis not present

## 2018-10-03 DIAGNOSIS — N186 End stage renal disease: Secondary | ICD-10-CM | POA: Diagnosis not present

## 2018-10-04 DIAGNOSIS — N186 End stage renal disease: Secondary | ICD-10-CM | POA: Diagnosis not present

## 2018-10-04 DIAGNOSIS — Z992 Dependence on renal dialysis: Secondary | ICD-10-CM | POA: Diagnosis not present

## 2018-10-05 DIAGNOSIS — Z992 Dependence on renal dialysis: Secondary | ICD-10-CM | POA: Diagnosis not present

## 2018-10-05 DIAGNOSIS — N186 End stage renal disease: Secondary | ICD-10-CM | POA: Diagnosis not present

## 2018-10-06 DIAGNOSIS — N186 End stage renal disease: Secondary | ICD-10-CM | POA: Diagnosis not present

## 2018-10-06 DIAGNOSIS — Z992 Dependence on renal dialysis: Secondary | ICD-10-CM | POA: Diagnosis not present

## 2018-10-07 DIAGNOSIS — Z992 Dependence on renal dialysis: Secondary | ICD-10-CM | POA: Diagnosis not present

## 2018-10-07 DIAGNOSIS — N186 End stage renal disease: Secondary | ICD-10-CM | POA: Diagnosis not present

## 2018-10-08 DIAGNOSIS — N186 End stage renal disease: Secondary | ICD-10-CM | POA: Diagnosis not present

## 2018-10-08 DIAGNOSIS — Z992 Dependence on renal dialysis: Secondary | ICD-10-CM | POA: Diagnosis not present

## 2018-10-09 DIAGNOSIS — N186 End stage renal disease: Secondary | ICD-10-CM | POA: Diagnosis not present

## 2018-10-09 DIAGNOSIS — Z992 Dependence on renal dialysis: Secondary | ICD-10-CM | POA: Diagnosis not present

## 2018-10-09 NOTE — Telephone Encounter (Signed)
LMOV to schedule possible evisit

## 2018-10-10 DIAGNOSIS — Z992 Dependence on renal dialysis: Secondary | ICD-10-CM | POA: Diagnosis not present

## 2018-10-10 DIAGNOSIS — N186 End stage renal disease: Secondary | ICD-10-CM | POA: Diagnosis not present

## 2018-10-11 DIAGNOSIS — N186 End stage renal disease: Secondary | ICD-10-CM | POA: Diagnosis not present

## 2018-10-11 DIAGNOSIS — Z992 Dependence on renal dialysis: Secondary | ICD-10-CM | POA: Diagnosis not present

## 2018-10-12 DIAGNOSIS — N186 End stage renal disease: Secondary | ICD-10-CM | POA: Diagnosis not present

## 2018-10-12 DIAGNOSIS — Z992 Dependence on renal dialysis: Secondary | ICD-10-CM | POA: Diagnosis not present

## 2018-10-13 DIAGNOSIS — N186 End stage renal disease: Secondary | ICD-10-CM | POA: Diagnosis not present

## 2018-10-13 DIAGNOSIS — Z992 Dependence on renal dialysis: Secondary | ICD-10-CM | POA: Diagnosis not present

## 2018-10-14 DIAGNOSIS — Z992 Dependence on renal dialysis: Secondary | ICD-10-CM | POA: Diagnosis not present

## 2018-10-14 DIAGNOSIS — N186 End stage renal disease: Secondary | ICD-10-CM | POA: Diagnosis not present

## 2018-10-15 DIAGNOSIS — N186 End stage renal disease: Secondary | ICD-10-CM | POA: Diagnosis not present

## 2018-10-15 DIAGNOSIS — Z992 Dependence on renal dialysis: Secondary | ICD-10-CM | POA: Diagnosis not present

## 2018-10-16 DIAGNOSIS — Z992 Dependence on renal dialysis: Secondary | ICD-10-CM | POA: Diagnosis not present

## 2018-10-16 DIAGNOSIS — N186 End stage renal disease: Secondary | ICD-10-CM | POA: Diagnosis not present

## 2018-10-17 DIAGNOSIS — Z992 Dependence on renal dialysis: Secondary | ICD-10-CM | POA: Diagnosis not present

## 2018-10-17 DIAGNOSIS — N186 End stage renal disease: Secondary | ICD-10-CM | POA: Diagnosis not present

## 2018-10-18 DIAGNOSIS — Z992 Dependence on renal dialysis: Secondary | ICD-10-CM | POA: Diagnosis not present

## 2018-10-18 DIAGNOSIS — N186 End stage renal disease: Secondary | ICD-10-CM | POA: Diagnosis not present

## 2018-10-19 DIAGNOSIS — Z992 Dependence on renal dialysis: Secondary | ICD-10-CM | POA: Diagnosis not present

## 2018-10-19 DIAGNOSIS — N186 End stage renal disease: Secondary | ICD-10-CM | POA: Diagnosis not present

## 2018-10-19 NOTE — Telephone Encounter (Signed)
Attempted to schedule Evisit no ans no vm

## 2018-10-20 DIAGNOSIS — Z992 Dependence on renal dialysis: Secondary | ICD-10-CM | POA: Diagnosis not present

## 2018-10-20 DIAGNOSIS — N186 End stage renal disease: Secondary | ICD-10-CM | POA: Diagnosis not present

## 2018-10-21 DIAGNOSIS — Z992 Dependence on renal dialysis: Secondary | ICD-10-CM | POA: Diagnosis not present

## 2018-10-21 DIAGNOSIS — N186 End stage renal disease: Secondary | ICD-10-CM | POA: Diagnosis not present

## 2018-10-22 DIAGNOSIS — N186 End stage renal disease: Secondary | ICD-10-CM | POA: Diagnosis not present

## 2018-10-22 DIAGNOSIS — Z992 Dependence on renal dialysis: Secondary | ICD-10-CM | POA: Diagnosis not present

## 2018-10-23 DIAGNOSIS — N186 End stage renal disease: Secondary | ICD-10-CM | POA: Diagnosis not present

## 2018-10-23 DIAGNOSIS — Z992 Dependence on renal dialysis: Secondary | ICD-10-CM | POA: Diagnosis not present

## 2018-10-23 NOTE — Telephone Encounter (Addendum)
Recall for 3 months put into Epic appointment. Removing from Covid pool.

## 2018-10-24 DIAGNOSIS — N186 End stage renal disease: Secondary | ICD-10-CM | POA: Diagnosis not present

## 2018-10-24 DIAGNOSIS — Z992 Dependence on renal dialysis: Secondary | ICD-10-CM | POA: Diagnosis not present

## 2018-10-25 DIAGNOSIS — Z992 Dependence on renal dialysis: Secondary | ICD-10-CM | POA: Diagnosis not present

## 2018-10-25 DIAGNOSIS — N186 End stage renal disease: Secondary | ICD-10-CM | POA: Diagnosis not present

## 2018-10-26 DIAGNOSIS — N186 End stage renal disease: Secondary | ICD-10-CM | POA: Diagnosis not present

## 2018-10-26 DIAGNOSIS — Z992 Dependence on renal dialysis: Secondary | ICD-10-CM | POA: Diagnosis not present

## 2018-10-27 DIAGNOSIS — N186 End stage renal disease: Secondary | ICD-10-CM | POA: Diagnosis not present

## 2018-10-27 DIAGNOSIS — Z992 Dependence on renal dialysis: Secondary | ICD-10-CM | POA: Diagnosis not present

## 2018-10-28 DIAGNOSIS — Z992 Dependence on renal dialysis: Secondary | ICD-10-CM | POA: Diagnosis not present

## 2018-10-28 DIAGNOSIS — N186 End stage renal disease: Secondary | ICD-10-CM | POA: Diagnosis not present

## 2018-10-29 DIAGNOSIS — N186 End stage renal disease: Secondary | ICD-10-CM | POA: Diagnosis not present

## 2018-10-29 DIAGNOSIS — Z992 Dependence on renal dialysis: Secondary | ICD-10-CM | POA: Diagnosis not present

## 2018-10-30 DIAGNOSIS — N186 End stage renal disease: Secondary | ICD-10-CM | POA: Diagnosis not present

## 2018-10-30 DIAGNOSIS — Z992 Dependence on renal dialysis: Secondary | ICD-10-CM | POA: Diagnosis not present

## 2018-10-31 DIAGNOSIS — Z992 Dependence on renal dialysis: Secondary | ICD-10-CM | POA: Diagnosis not present

## 2018-10-31 DIAGNOSIS — N186 End stage renal disease: Secondary | ICD-10-CM | POA: Diagnosis not present

## 2018-11-01 DIAGNOSIS — Z992 Dependence on renal dialysis: Secondary | ICD-10-CM | POA: Diagnosis not present

## 2018-11-01 DIAGNOSIS — N186 End stage renal disease: Secondary | ICD-10-CM | POA: Diagnosis not present

## 2018-11-02 DIAGNOSIS — N186 End stage renal disease: Secondary | ICD-10-CM | POA: Diagnosis not present

## 2018-11-02 DIAGNOSIS — Z992 Dependence on renal dialysis: Secondary | ICD-10-CM | POA: Diagnosis not present

## 2018-11-03 DIAGNOSIS — Z992 Dependence on renal dialysis: Secondary | ICD-10-CM | POA: Diagnosis not present

## 2018-11-03 DIAGNOSIS — N186 End stage renal disease: Secondary | ICD-10-CM | POA: Diagnosis not present

## 2018-11-04 DIAGNOSIS — N186 End stage renal disease: Secondary | ICD-10-CM | POA: Diagnosis not present

## 2018-11-04 DIAGNOSIS — Z992 Dependence on renal dialysis: Secondary | ICD-10-CM | POA: Diagnosis not present

## 2018-11-05 DIAGNOSIS — Z992 Dependence on renal dialysis: Secondary | ICD-10-CM | POA: Diagnosis not present

## 2018-11-05 DIAGNOSIS — N186 End stage renal disease: Secondary | ICD-10-CM | POA: Diagnosis not present

## 2018-11-06 DIAGNOSIS — N186 End stage renal disease: Secondary | ICD-10-CM | POA: Diagnosis not present

## 2018-11-06 DIAGNOSIS — Z992 Dependence on renal dialysis: Secondary | ICD-10-CM | POA: Diagnosis not present

## 2018-11-07 DIAGNOSIS — N186 End stage renal disease: Secondary | ICD-10-CM | POA: Diagnosis not present

## 2018-11-07 DIAGNOSIS — Z992 Dependence on renal dialysis: Secondary | ICD-10-CM | POA: Diagnosis not present

## 2018-11-08 DIAGNOSIS — Z992 Dependence on renal dialysis: Secondary | ICD-10-CM | POA: Diagnosis not present

## 2018-11-08 DIAGNOSIS — N186 End stage renal disease: Secondary | ICD-10-CM | POA: Diagnosis not present

## 2018-11-09 DIAGNOSIS — Z992 Dependence on renal dialysis: Secondary | ICD-10-CM | POA: Diagnosis not present

## 2018-11-09 DIAGNOSIS — N186 End stage renal disease: Secondary | ICD-10-CM | POA: Diagnosis not present

## 2018-11-10 DIAGNOSIS — N2581 Secondary hyperparathyroidism of renal origin: Secondary | ICD-10-CM | POA: Diagnosis not present

## 2018-11-10 DIAGNOSIS — N186 End stage renal disease: Secondary | ICD-10-CM | POA: Diagnosis not present

## 2018-11-10 DIAGNOSIS — Z992 Dependence on renal dialysis: Secondary | ICD-10-CM | POA: Diagnosis not present

## 2018-11-10 DIAGNOSIS — D509 Iron deficiency anemia, unspecified: Secondary | ICD-10-CM | POA: Diagnosis not present

## 2018-11-11 DIAGNOSIS — N2581 Secondary hyperparathyroidism of renal origin: Secondary | ICD-10-CM | POA: Diagnosis not present

## 2018-11-11 DIAGNOSIS — Z992 Dependence on renal dialysis: Secondary | ICD-10-CM | POA: Diagnosis not present

## 2018-11-11 DIAGNOSIS — D509 Iron deficiency anemia, unspecified: Secondary | ICD-10-CM | POA: Diagnosis not present

## 2018-11-11 DIAGNOSIS — N186 End stage renal disease: Secondary | ICD-10-CM | POA: Diagnosis not present

## 2018-11-12 DIAGNOSIS — Z992 Dependence on renal dialysis: Secondary | ICD-10-CM | POA: Diagnosis not present

## 2018-11-12 DIAGNOSIS — N186 End stage renal disease: Secondary | ICD-10-CM | POA: Diagnosis not present

## 2018-11-12 DIAGNOSIS — D509 Iron deficiency anemia, unspecified: Secondary | ICD-10-CM | POA: Diagnosis not present

## 2018-11-12 DIAGNOSIS — N2581 Secondary hyperparathyroidism of renal origin: Secondary | ICD-10-CM | POA: Diagnosis not present

## 2018-11-13 DIAGNOSIS — N186 End stage renal disease: Secondary | ICD-10-CM | POA: Diagnosis not present

## 2018-11-13 DIAGNOSIS — N2581 Secondary hyperparathyroidism of renal origin: Secondary | ICD-10-CM | POA: Diagnosis not present

## 2018-11-13 DIAGNOSIS — Z992 Dependence on renal dialysis: Secondary | ICD-10-CM | POA: Diagnosis not present

## 2018-11-13 DIAGNOSIS — D509 Iron deficiency anemia, unspecified: Secondary | ICD-10-CM | POA: Diagnosis not present

## 2018-11-14 DIAGNOSIS — N2581 Secondary hyperparathyroidism of renal origin: Secondary | ICD-10-CM | POA: Diagnosis not present

## 2018-11-14 DIAGNOSIS — N186 End stage renal disease: Secondary | ICD-10-CM | POA: Diagnosis not present

## 2018-11-14 DIAGNOSIS — Z992 Dependence on renal dialysis: Secondary | ICD-10-CM | POA: Diagnosis not present

## 2018-11-14 DIAGNOSIS — D509 Iron deficiency anemia, unspecified: Secondary | ICD-10-CM | POA: Diagnosis not present

## 2018-11-15 DIAGNOSIS — N186 End stage renal disease: Secondary | ICD-10-CM | POA: Diagnosis not present

## 2018-11-15 DIAGNOSIS — Z992 Dependence on renal dialysis: Secondary | ICD-10-CM | POA: Diagnosis not present

## 2018-11-15 DIAGNOSIS — N2581 Secondary hyperparathyroidism of renal origin: Secondary | ICD-10-CM | POA: Diagnosis not present

## 2018-11-15 DIAGNOSIS — D509 Iron deficiency anemia, unspecified: Secondary | ICD-10-CM | POA: Diagnosis not present

## 2018-11-16 DIAGNOSIS — N186 End stage renal disease: Secondary | ICD-10-CM | POA: Diagnosis not present

## 2018-11-16 DIAGNOSIS — Z992 Dependence on renal dialysis: Secondary | ICD-10-CM | POA: Diagnosis not present

## 2018-11-16 DIAGNOSIS — D509 Iron deficiency anemia, unspecified: Secondary | ICD-10-CM | POA: Diagnosis not present

## 2018-11-16 DIAGNOSIS — N2581 Secondary hyperparathyroidism of renal origin: Secondary | ICD-10-CM | POA: Diagnosis not present

## 2018-11-17 DIAGNOSIS — Z992 Dependence on renal dialysis: Secondary | ICD-10-CM | POA: Diagnosis not present

## 2018-11-17 DIAGNOSIS — N2581 Secondary hyperparathyroidism of renal origin: Secondary | ICD-10-CM | POA: Diagnosis not present

## 2018-11-17 DIAGNOSIS — N186 End stage renal disease: Secondary | ICD-10-CM | POA: Diagnosis not present

## 2018-11-17 DIAGNOSIS — D509 Iron deficiency anemia, unspecified: Secondary | ICD-10-CM | POA: Diagnosis not present

## 2018-11-18 DIAGNOSIS — N186 End stage renal disease: Secondary | ICD-10-CM | POA: Diagnosis not present

## 2018-11-18 DIAGNOSIS — N2581 Secondary hyperparathyroidism of renal origin: Secondary | ICD-10-CM | POA: Diagnosis not present

## 2018-11-18 DIAGNOSIS — D509 Iron deficiency anemia, unspecified: Secondary | ICD-10-CM | POA: Diagnosis not present

## 2018-11-18 DIAGNOSIS — Z992 Dependence on renal dialysis: Secondary | ICD-10-CM | POA: Diagnosis not present

## 2018-11-19 DIAGNOSIS — Z992 Dependence on renal dialysis: Secondary | ICD-10-CM | POA: Diagnosis not present

## 2018-11-19 DIAGNOSIS — N2581 Secondary hyperparathyroidism of renal origin: Secondary | ICD-10-CM | POA: Diagnosis not present

## 2018-11-19 DIAGNOSIS — D509 Iron deficiency anemia, unspecified: Secondary | ICD-10-CM | POA: Diagnosis not present

## 2018-11-19 DIAGNOSIS — N186 End stage renal disease: Secondary | ICD-10-CM | POA: Diagnosis not present

## 2018-11-20 DIAGNOSIS — Z992 Dependence on renal dialysis: Secondary | ICD-10-CM | POA: Diagnosis not present

## 2018-11-20 DIAGNOSIS — N186 End stage renal disease: Secondary | ICD-10-CM | POA: Diagnosis not present

## 2018-11-20 DIAGNOSIS — D509 Iron deficiency anemia, unspecified: Secondary | ICD-10-CM | POA: Diagnosis not present

## 2018-11-20 DIAGNOSIS — N2581 Secondary hyperparathyroidism of renal origin: Secondary | ICD-10-CM | POA: Diagnosis not present

## 2018-11-21 DIAGNOSIS — Z992 Dependence on renal dialysis: Secondary | ICD-10-CM | POA: Diagnosis not present

## 2018-11-21 DIAGNOSIS — N186 End stage renal disease: Secondary | ICD-10-CM | POA: Diagnosis not present

## 2018-11-21 DIAGNOSIS — N2581 Secondary hyperparathyroidism of renal origin: Secondary | ICD-10-CM | POA: Diagnosis not present

## 2018-11-21 DIAGNOSIS — D509 Iron deficiency anemia, unspecified: Secondary | ICD-10-CM | POA: Diagnosis not present

## 2018-11-22 DIAGNOSIS — N186 End stage renal disease: Secondary | ICD-10-CM | POA: Diagnosis not present

## 2018-11-22 DIAGNOSIS — Z992 Dependence on renal dialysis: Secondary | ICD-10-CM | POA: Diagnosis not present

## 2018-11-22 DIAGNOSIS — D509 Iron deficiency anemia, unspecified: Secondary | ICD-10-CM | POA: Diagnosis not present

## 2018-11-22 DIAGNOSIS — N2581 Secondary hyperparathyroidism of renal origin: Secondary | ICD-10-CM | POA: Diagnosis not present

## 2018-11-23 DIAGNOSIS — N2581 Secondary hyperparathyroidism of renal origin: Secondary | ICD-10-CM | POA: Diagnosis not present

## 2018-11-23 DIAGNOSIS — N186 End stage renal disease: Secondary | ICD-10-CM | POA: Diagnosis not present

## 2018-11-23 DIAGNOSIS — D509 Iron deficiency anemia, unspecified: Secondary | ICD-10-CM | POA: Diagnosis not present

## 2018-11-23 DIAGNOSIS — Z992 Dependence on renal dialysis: Secondary | ICD-10-CM | POA: Diagnosis not present

## 2018-11-24 DIAGNOSIS — N2581 Secondary hyperparathyroidism of renal origin: Secondary | ICD-10-CM | POA: Diagnosis not present

## 2018-11-24 DIAGNOSIS — N186 End stage renal disease: Secondary | ICD-10-CM | POA: Diagnosis not present

## 2018-11-24 DIAGNOSIS — Z992 Dependence on renal dialysis: Secondary | ICD-10-CM | POA: Diagnosis not present

## 2018-11-24 DIAGNOSIS — D509 Iron deficiency anemia, unspecified: Secondary | ICD-10-CM | POA: Diagnosis not present

## 2018-11-25 DIAGNOSIS — N2581 Secondary hyperparathyroidism of renal origin: Secondary | ICD-10-CM | POA: Diagnosis not present

## 2018-11-25 DIAGNOSIS — D509 Iron deficiency anemia, unspecified: Secondary | ICD-10-CM | POA: Diagnosis not present

## 2018-11-25 DIAGNOSIS — Z992 Dependence on renal dialysis: Secondary | ICD-10-CM | POA: Diagnosis not present

## 2018-11-25 DIAGNOSIS — N186 End stage renal disease: Secondary | ICD-10-CM | POA: Diagnosis not present

## 2018-11-26 DIAGNOSIS — N186 End stage renal disease: Secondary | ICD-10-CM | POA: Diagnosis not present

## 2018-11-26 DIAGNOSIS — Z992 Dependence on renal dialysis: Secondary | ICD-10-CM | POA: Diagnosis not present

## 2018-11-26 DIAGNOSIS — D509 Iron deficiency anemia, unspecified: Secondary | ICD-10-CM | POA: Diagnosis not present

## 2018-11-26 DIAGNOSIS — N2581 Secondary hyperparathyroidism of renal origin: Secondary | ICD-10-CM | POA: Diagnosis not present

## 2018-11-27 DIAGNOSIS — N186 End stage renal disease: Secondary | ICD-10-CM | POA: Diagnosis not present

## 2018-11-27 DIAGNOSIS — N2581 Secondary hyperparathyroidism of renal origin: Secondary | ICD-10-CM | POA: Diagnosis not present

## 2018-11-27 DIAGNOSIS — Z992 Dependence on renal dialysis: Secondary | ICD-10-CM | POA: Diagnosis not present

## 2018-11-27 DIAGNOSIS — D509 Iron deficiency anemia, unspecified: Secondary | ICD-10-CM | POA: Diagnosis not present

## 2018-11-28 DIAGNOSIS — N186 End stage renal disease: Secondary | ICD-10-CM | POA: Diagnosis not present

## 2018-11-28 DIAGNOSIS — Z992 Dependence on renal dialysis: Secondary | ICD-10-CM | POA: Diagnosis not present

## 2018-11-28 DIAGNOSIS — D509 Iron deficiency anemia, unspecified: Secondary | ICD-10-CM | POA: Diagnosis not present

## 2018-11-28 DIAGNOSIS — N2581 Secondary hyperparathyroidism of renal origin: Secondary | ICD-10-CM | POA: Diagnosis not present

## 2018-11-29 DIAGNOSIS — Z992 Dependence on renal dialysis: Secondary | ICD-10-CM | POA: Diagnosis not present

## 2018-11-29 DIAGNOSIS — N186 End stage renal disease: Secondary | ICD-10-CM | POA: Diagnosis not present

## 2018-11-29 DIAGNOSIS — D509 Iron deficiency anemia, unspecified: Secondary | ICD-10-CM | POA: Diagnosis not present

## 2018-11-29 DIAGNOSIS — N2581 Secondary hyperparathyroidism of renal origin: Secondary | ICD-10-CM | POA: Diagnosis not present

## 2018-11-30 DIAGNOSIS — N2581 Secondary hyperparathyroidism of renal origin: Secondary | ICD-10-CM | POA: Diagnosis not present

## 2018-11-30 DIAGNOSIS — D509 Iron deficiency anemia, unspecified: Secondary | ICD-10-CM | POA: Diagnosis not present

## 2018-11-30 DIAGNOSIS — N186 End stage renal disease: Secondary | ICD-10-CM | POA: Diagnosis not present

## 2018-11-30 DIAGNOSIS — Z992 Dependence on renal dialysis: Secondary | ICD-10-CM | POA: Diagnosis not present

## 2018-12-01 DIAGNOSIS — N186 End stage renal disease: Secondary | ICD-10-CM | POA: Diagnosis not present

## 2018-12-01 DIAGNOSIS — N2581 Secondary hyperparathyroidism of renal origin: Secondary | ICD-10-CM | POA: Diagnosis not present

## 2018-12-01 DIAGNOSIS — D509 Iron deficiency anemia, unspecified: Secondary | ICD-10-CM | POA: Diagnosis not present

## 2018-12-01 DIAGNOSIS — Z992 Dependence on renal dialysis: Secondary | ICD-10-CM | POA: Diagnosis not present

## 2018-12-02 DIAGNOSIS — N186 End stage renal disease: Secondary | ICD-10-CM | POA: Diagnosis not present

## 2018-12-02 DIAGNOSIS — Z992 Dependence on renal dialysis: Secondary | ICD-10-CM | POA: Diagnosis not present

## 2018-12-02 DIAGNOSIS — N2581 Secondary hyperparathyroidism of renal origin: Secondary | ICD-10-CM | POA: Diagnosis not present

## 2018-12-02 DIAGNOSIS — D509 Iron deficiency anemia, unspecified: Secondary | ICD-10-CM | POA: Diagnosis not present

## 2018-12-03 DIAGNOSIS — N2581 Secondary hyperparathyroidism of renal origin: Secondary | ICD-10-CM | POA: Diagnosis not present

## 2018-12-03 DIAGNOSIS — Z992 Dependence on renal dialysis: Secondary | ICD-10-CM | POA: Diagnosis not present

## 2018-12-03 DIAGNOSIS — D509 Iron deficiency anemia, unspecified: Secondary | ICD-10-CM | POA: Diagnosis not present

## 2018-12-03 DIAGNOSIS — N186 End stage renal disease: Secondary | ICD-10-CM | POA: Diagnosis not present

## 2018-12-04 DIAGNOSIS — N2581 Secondary hyperparathyroidism of renal origin: Secondary | ICD-10-CM | POA: Diagnosis not present

## 2018-12-04 DIAGNOSIS — D509 Iron deficiency anemia, unspecified: Secondary | ICD-10-CM | POA: Diagnosis not present

## 2018-12-04 DIAGNOSIS — N186 End stage renal disease: Secondary | ICD-10-CM | POA: Diagnosis not present

## 2018-12-04 DIAGNOSIS — Z992 Dependence on renal dialysis: Secondary | ICD-10-CM | POA: Diagnosis not present

## 2018-12-05 DIAGNOSIS — D509 Iron deficiency anemia, unspecified: Secondary | ICD-10-CM | POA: Diagnosis not present

## 2018-12-05 DIAGNOSIS — E119 Type 2 diabetes mellitus without complications: Secondary | ICD-10-CM | POA: Diagnosis not present

## 2018-12-05 DIAGNOSIS — N2581 Secondary hyperparathyroidism of renal origin: Secondary | ICD-10-CM | POA: Diagnosis not present

## 2018-12-05 DIAGNOSIS — N186 End stage renal disease: Secondary | ICD-10-CM | POA: Diagnosis not present

## 2018-12-05 DIAGNOSIS — Z992 Dependence on renal dialysis: Secondary | ICD-10-CM | POA: Diagnosis not present

## 2018-12-06 DIAGNOSIS — Z992 Dependence on renal dialysis: Secondary | ICD-10-CM | POA: Diagnosis not present

## 2018-12-06 DIAGNOSIS — N186 End stage renal disease: Secondary | ICD-10-CM | POA: Diagnosis not present

## 2018-12-06 DIAGNOSIS — D509 Iron deficiency anemia, unspecified: Secondary | ICD-10-CM | POA: Diagnosis not present

## 2018-12-06 DIAGNOSIS — N2581 Secondary hyperparathyroidism of renal origin: Secondary | ICD-10-CM | POA: Diagnosis not present

## 2018-12-07 DIAGNOSIS — Z992 Dependence on renal dialysis: Secondary | ICD-10-CM | POA: Diagnosis not present

## 2018-12-07 DIAGNOSIS — N2581 Secondary hyperparathyroidism of renal origin: Secondary | ICD-10-CM | POA: Diagnosis not present

## 2018-12-07 DIAGNOSIS — N186 End stage renal disease: Secondary | ICD-10-CM | POA: Diagnosis not present

## 2018-12-07 DIAGNOSIS — D509 Iron deficiency anemia, unspecified: Secondary | ICD-10-CM | POA: Diagnosis not present

## 2018-12-08 DIAGNOSIS — D509 Iron deficiency anemia, unspecified: Secondary | ICD-10-CM | POA: Diagnosis not present

## 2018-12-08 DIAGNOSIS — Z992 Dependence on renal dialysis: Secondary | ICD-10-CM | POA: Diagnosis not present

## 2018-12-08 DIAGNOSIS — N186 End stage renal disease: Secondary | ICD-10-CM | POA: Diagnosis not present

## 2018-12-08 DIAGNOSIS — N2581 Secondary hyperparathyroidism of renal origin: Secondary | ICD-10-CM | POA: Diagnosis not present

## 2018-12-09 DIAGNOSIS — Z992 Dependence on renal dialysis: Secondary | ICD-10-CM | POA: Diagnosis not present

## 2018-12-09 DIAGNOSIS — D509 Iron deficiency anemia, unspecified: Secondary | ICD-10-CM | POA: Diagnosis not present

## 2018-12-09 DIAGNOSIS — N2581 Secondary hyperparathyroidism of renal origin: Secondary | ICD-10-CM | POA: Diagnosis not present

## 2018-12-09 DIAGNOSIS — N186 End stage renal disease: Secondary | ICD-10-CM | POA: Diagnosis not present

## 2018-12-10 DIAGNOSIS — Z992 Dependence on renal dialysis: Secondary | ICD-10-CM | POA: Diagnosis not present

## 2018-12-10 DIAGNOSIS — N186 End stage renal disease: Secondary | ICD-10-CM | POA: Diagnosis not present

## 2018-12-10 DIAGNOSIS — N2581 Secondary hyperparathyroidism of renal origin: Secondary | ICD-10-CM | POA: Diagnosis not present

## 2018-12-10 DIAGNOSIS — D509 Iron deficiency anemia, unspecified: Secondary | ICD-10-CM | POA: Diagnosis not present

## 2018-12-11 DIAGNOSIS — Z992 Dependence on renal dialysis: Secondary | ICD-10-CM | POA: Diagnosis not present

## 2018-12-11 DIAGNOSIS — N186 End stage renal disease: Secondary | ICD-10-CM | POA: Diagnosis not present

## 2018-12-12 DIAGNOSIS — Z992 Dependence on renal dialysis: Secondary | ICD-10-CM | POA: Diagnosis not present

## 2018-12-12 DIAGNOSIS — N186 End stage renal disease: Secondary | ICD-10-CM | POA: Diagnosis not present

## 2018-12-13 DIAGNOSIS — Z992 Dependence on renal dialysis: Secondary | ICD-10-CM | POA: Diagnosis not present

## 2018-12-13 DIAGNOSIS — N186 End stage renal disease: Secondary | ICD-10-CM | POA: Diagnosis not present

## 2018-12-14 DIAGNOSIS — Z992 Dependence on renal dialysis: Secondary | ICD-10-CM | POA: Diagnosis not present

## 2018-12-14 DIAGNOSIS — N186 End stage renal disease: Secondary | ICD-10-CM | POA: Diagnosis not present

## 2018-12-15 ENCOUNTER — Emergency Department
Admission: EM | Admit: 2018-12-15 | Discharge: 2018-12-15 | Disposition: A | Discharge status: Home / Self Care | Payer: Medicare Other | Attending: Emergency Medicine | Admitting: Emergency Medicine

## 2018-12-15 ENCOUNTER — Other Ambulatory Visit: Payer: Self-pay

## 2018-12-15 ENCOUNTER — Emergency Department: Payer: Medicare Other

## 2018-12-15 DIAGNOSIS — Z992 Dependence on renal dialysis: Secondary | ICD-10-CM | POA: Insufficient documentation

## 2018-12-15 DIAGNOSIS — Y9301 Activity, walking, marching and hiking: Secondary | ICD-10-CM | POA: Insufficient documentation

## 2018-12-15 DIAGNOSIS — Z7901 Long term (current) use of anticoagulants: Secondary | ICD-10-CM | POA: Insufficient documentation

## 2018-12-15 DIAGNOSIS — N186 End stage renal disease: Secondary | ICD-10-CM | POA: Insufficient documentation

## 2018-12-15 DIAGNOSIS — I509 Heart failure, unspecified: Secondary | ICD-10-CM | POA: Diagnosis not present

## 2018-12-15 DIAGNOSIS — S0990XA Unspecified injury of head, initial encounter: Secondary | ICD-10-CM | POA: Diagnosis present

## 2018-12-15 DIAGNOSIS — R51 Headache: Secondary | ICD-10-CM | POA: Diagnosis not present

## 2018-12-15 DIAGNOSIS — S01312A Laceration without foreign body of left ear, initial encounter: Secondary | ICD-10-CM | POA: Diagnosis not present

## 2018-12-15 DIAGNOSIS — Y999 Unspecified external cause status: Secondary | ICD-10-CM | POA: Diagnosis not present

## 2018-12-15 DIAGNOSIS — W010XXA Fall on same level from slipping, tripping and stumbling without subsequent striking against object, initial encounter: Secondary | ICD-10-CM | POA: Insufficient documentation

## 2018-12-15 DIAGNOSIS — E1122 Type 2 diabetes mellitus with diabetic chronic kidney disease: Secondary | ICD-10-CM | POA: Diagnosis not present

## 2018-12-15 DIAGNOSIS — S01311A Laceration without foreign body of right ear, initial encounter: Secondary | ICD-10-CM | POA: Insufficient documentation

## 2018-12-15 DIAGNOSIS — Z79899 Other long term (current) drug therapy: Secondary | ICD-10-CM | POA: Diagnosis not present

## 2018-12-15 DIAGNOSIS — W19XXXA Unspecified fall, initial encounter: Secondary | ICD-10-CM

## 2018-12-15 DIAGNOSIS — I132 Hypertensive heart and chronic kidney disease with heart failure and with stage 5 chronic kidney disease, or end stage renal disease: Secondary | ICD-10-CM | POA: Insufficient documentation

## 2018-12-15 DIAGNOSIS — Y92003 Bedroom of unspecified non-institutional (private) residence as the place of occurrence of the external cause: Secondary | ICD-10-CM | POA: Diagnosis not present

## 2018-12-15 MED ORDER — LIDOCAINE-EPINEPHRINE-TETRACAINE (LET) SOLUTION
3.0000 mL | Freq: Once | NASAL | Status: AC
  Administered 2018-12-15: 3 mL via TOPICAL
  Filled 2018-12-15: qty 3

## 2018-12-15 MED ORDER — CEPHALEXIN 500 MG PO CAPS: 500.0000 mg | ORAL_CAPSULE | Freq: Two times a day (BID) | ORAL | 0 refills | Status: AC

## 2018-12-15 NOTE — ED Triage Notes (Signed)
Patient has family visiting and they are sleeping in her bedroom floor.  Patient tripped and fell into the wall.  Patient reports pain to right ear.  Patient with laceration to right ear, no active bleeding at this time.  Patient denies loss of consciousness.

## 2018-12-15 NOTE — ED Provider Notes (Signed)
[facility] Emergency Department Provider Note  ____________________________________________  Time seen: Approximately 8:25 PM  I have reviewed the triage vital signs and the nursing notes.   HISTORY  Chief Complaint Fall and Ear Injury    HPI Stacy Pham is a 85 y.o. female presents to the emergency department with a right ear laceration after patient reportedly lost her balance and fell against a wall.  Patient states that her great grandchildren are visiting and wanted to sleep on her bedroom floor so that they could watch TV and be close to her.  Patient denies loss of consciousness.  She denies any blurry vision, vertigo or nausea.  Patient states that she is only here for ear laceration.  She has been able to ambulate since fall occurred.  No other alleviating measures have been attempted.        Past Medical History:  Diagnosis Date  . (HFpEF) heart failure with preserved ejection fraction ([place])    a. 10/2017 Echo: EF 60-65%, Gr1 DD, mild MR, mildly dil LA/RA. PASP 37mHg.  . Arthritis   . Back pain   . Bronchitis   . Cataract   . Diabetes mellitus without complication ([place]   . Dialysis patient ([place]   . ESRD (end stage renal disease) ([place]    a. 2018 - initially on HD but then transitioned to nightly PD in 08/2017.  .Marland KitchenGout   . Hypertension   . PAF (paroxysmal atrial fibrillation) (HCC)    a. CHA2DS2VASc = 5-->eliquis 2.5 BID.    Patient Active Problem List   Diagnosis Date Noted  . Hyperlipidemia 05/03/2018  . Chronic heart failure with preserved ejection fraction ([place] 12/28/2017  . 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 (HA[place] 08/25/2017  . ESRD (end stage renal disease) ([place] 08/25/2017  . Paroxysmal atrial fibrillation ([place] 05/21/2017  . Hypertension 05/21/2017  . Type 2 diabetes mellitus with chronic kidney disease on chronic dialysis, without long-term current use of insulin ([place] 05/21/2017  . Stage 5 chronic kidney disease on  chronic dialysis ([place] 05/21/2017  . Acute hyperkalemia 04/27/2017    Past Surgical History:  Procedure Laterality Date  . AV FISTULA PLACEMENT Right 09/13/2018   Procedure: INSERTION OF ARTERIOVENOUS (AV) GORE-TEX GRAFT ARM;  Surgeon: SKatha Cabal MD;  Location: [facility] ORS;  Service: Vascular;  Laterality: Right;  . CAPD INSERTION N/A 06/08/2017   Procedure: LAPAROSCOPIC INSERTION CONTINUOUS AMBULATORY PERITONEAL DIALYSIS  (CAPD) CATHETER;  Surgeon: SKatha Cabal MD;  Location: [facility] ORS;  Service: Vascular;  Laterality: N/A;  . CATARACT EXTRACTION, BILATERAL Bilateral   . DIALYSIS/PERMA CATHETER INSERTION N/A 03/18/2017   Procedure: DIALYSIS/PERMA CATHETER INSERTION;  Surgeon: SKatha Cabal MD;  [facility];  Service: Cardiovascular;  Laterality: N/A;  . DIALYSIS/PERMA CATHETER REMOVAL N/A 10/20/2017   Procedure: DIALYSIS/PERMA CATHETER REMOVAL;  Surgeon: DAlgernon Huxley MD;  [facility];  Service: Cardiovascular;  Laterality: N/A;  . EYE SURGERY      Prior to Admission medications   Medication Sig Start Date End Date Taking? Authorizing Provider  acetaminophen (TYLENOL 8 HOUR ARTHRITIS PAIN) 650 MG CR tablet Take 650 mg every 8 (eight) hours as needed by mouth for pain.    [provider]  allopurinol (ZYLOPRIM) 100 MG tablet Take 1 tablet (100 mg total) by mouth at bedtime. 09/08/18   GElby Beck FNP  amiodarone (PACERONE) 200 MG tablet Take 1 tablet (200 mg) by mouth two times a day for 2  weeks, then decrease to 1 tablet (200 mg) by mouth once a day. 08/22/18   Theora Gianotti, NP  apixaban (ELIQUIS) 2.5 MG TABS tablet Take 1 tablet (2.5 mg total) by mouth 2 (two) times daily. 09/08/18   Elby Beck, FNP  atorvastatin (LIPITOR) 20 MG tablet Take 1 tablet (20 mg total) by mouth every evening. 09/08/18   Elby Beck, FNP  blood glucose meter kit and supplies KIT Please dispense either One Touch or Bayer  Contour She must have one of these machines since she is on peritoneal dialysis. Use up to four times daily as directed. (FOR ICD-9 250.00, 250.01). 09/23/17   Elby Beck, FNP  calcitRIOL (ROCALTROL) 0.25 MCG capsule Take 0.25 mcg by mouth daily.    [provider]  cephALEXin (KEFLEX) 500 MG capsule Take 1 capsule (500 mg total) by mouth 2 (two) times daily for 7 days. 12/15/18 12/22/18  Lannie Fields, PA-C  cinacalcet (SENSIPAR) 30 MG tablet Take 1 tablet (30 mg total) by mouth daily with supper. Patient taking differently: Take 30 mg by mouth daily with supper. With largest meal 04/28/17   [name], MD  colchicine 0.6 MG tablet Take 1 tablet (0.6 mg total) by mouth daily as needed (for gout flare ups). 09/08/18   Elby Beck, FNP  ferric citrate (AURYXIA) 1 GM 210 MG(Fe) tablet Take 210 mg by mouth 3 (three) times daily with meals.    [provider]  gabapentin (NEURONTIN) 100 MG capsule Take 2 capsules (200 mg total) by mouth 3 (three) times daily. 09/08/18   Elby Beck, FNP  glucose blood (ONETOUCH VERIO) test strip Test blood sugar four times daily. Dx 250.00, 250.01, E11.22, N18.6 09/23/17   Elby Beck, FNP  HYDROcodone-acetaminophen (NORCO) 5-325 MG tablet Take 1-2 tablets by mouth every 6 (six) hours as needed for moderate pain or severe pain. 09/13/18   Schnier, Dolores Lory, MD  hydroxypropyl methylcellulose / hypromellose (ISOPTO TEARS / GONIOVISC) 2.5 % ophthalmic solution Place 1-2 drops 3 (three) times daily as needed into both eyes for dry eyes.    [provider]  metoprolol tartrate (LOPRESSOR) 25 MG tablet  08/25/18   [provider]  [facility] DELICA LANCETS 67E MISC Test blood sugar four times daily. Dx 250.00, 250.01, E11.22, N18.6 09/23/17   Elby Beck, FNP    Allergies Patient has no known allergies.  Family History  Problem Relation Age of Onset  . Hypertension Father     Social History Social History    Tobacco Use  . Smoking status: Never Smoker  . Smokeless tobacco: Never Used  Substance Use Topics  . Alcohol use: No  . Drug use: No     Review of Systems  Constitutional: No fever/chills Eyes: No visual changes. No discharge ENT: No upper respiratory complaints. Cardiovascular: no chest pain. Respiratory: no cough. No SOB. Gastrointestinal: No abdominal pain.  No nausea, no vomiting.  No diarrhea.  No constipation. Musculoskeletal: Negative for musculoskeletal pain. Skin: Patient has left ear laceration.  Neurological: Negative for headaches, focal weakness or numbness.  ____________________________________________   PHYSICAL EXAM:  VITAL SIGNS: ED Triage Vitals  Enc Vitals Group     BP 12/15/18 1928 (!) 145/68     Pulse Rate 12/15/18 1928 60     Resp 12/15/18 1928 18     Temp 12/15/18 1928 97.8 F (36.6 C)     Temp Source 12/15/18 1928 Oral     SpO2  12/15/18 1928 98 %     Weight 12/15/18 1925 192 lb (87.1 kg)     Height 12/15/18 1925 5' (1.524 m)     Head Circumference --      Peak Flow --      Pain Score --      Pain Loc --      Pain Edu? --      Excl. in [place]? --      Constitutional: Alert and oriented. Well appearing and in no acute distress. Eyes: Conjunctivae are normal. PERRL. EOMI. Head: Atraumatic. ENT:      Ears: Patient has laceration along antihelix with extension into the concha.      Nose: No congestion/rhinnorhea.      Mouth/Throat: Mucous membranes are moist.  Neck: No stridor. FROM.  Cardiovascular: Normal rate, regular rhythm. Normal S1 and S2.  Good peripheral circulation. Respiratory: Normal respiratory effort without tachypnea or retractions. Lungs CTAB. Good air entry to the bases with no decreased or absent breath sounds. Gastrointestinal: Bowel sounds 4 quadrants. Soft and nontender to palpation. No guarding or rigidity. No palpable masses. No distention. No CVA tenderness. Musculoskeletal: Full range of motion to all extremities.  No gross deformities appreciated. Neurologic:  Normal speech and language. No gross focal neurologic deficits are appreciated.  Skin:  Skin is warm, dry and intact. No rash noted. Psychiatric: Mood and affect are normal. Speech and behavior are normal. Patient exhibits appropriate insight and judgement.   ____________________________________________   LABS (all labs ordered are listed, but only abnormal results are displayed)  Labs Reviewed - No data to display ____________________________________________  EKG   ____________________________________________  RADIOLOGY I personally viewed and evaluated these images as part of my medical decision making, as well as reviewing the written report by the radiologist.  Ct Head Wo Contrast  Result Date: 12/15/2018 CLINICAL DATA:  Pain status post fall EXAM: CT HEAD WITHOUT CONTRAST TECHNIQUE: Contiguous axial images were obtained from the base of the skull through the vertex without intravenous contrast. COMPARISON:  None. FINDINGS: Brain: No evidence of acute infarction, hemorrhage, hydrocephalus, extra-axial collection or mass lesion/mass effect. Vascular: No hyperdense vessel or unexpected calcification. Skull: Normal. Negative for fracture or focal lesion. Sinuses/Orbits: No acute finding. The patient is status post bilateral cataract surgery. Other: None. IMPRESSION: No acute intracranial abnormality detected. Electronically Signed   By: Constance Holster M.D.   On: 12/15/2018 20:28    ____________________________________________    PROCEDURES  Procedure(s) performed:    Procedures  LACERATION REPAIR Performed by: Lannie Fields Authorized by: Lannie Fields Consent: Verbal consent obtained. Risks and benefits: risks, benefits and alternatives were discussed Consent given by: patient Patient identity confirmed: provided demographic data Prepped and Draped in normal sterile fashion Wound explored  Laceration Location: Left  ear   Laceration Length: 1 cm  No Foreign Bodies seen or palpated  Anesthesia: LET  Anesthetic total: 3 ml  Irrigation method: syringe Amount of cleaning: standard  Skin closure: 5-0 Ethilon   Number of sutures: 6  Technique: Simple Interrupted   Patient tolerance: Patient tolerated the procedure well with no immediate complications.   Medications  lidocaine-EPINEPHrine-tetracaine (LET) solution (3 mLs Topical Given 12/15/18 2004)     ____________________________________________   INITIAL IMPRESSION / ASSESSMENT AND PLAN / ED COURSE  Pertinent labs & imaging results that were available during my care of the patient were reviewed by me and considered in my medical decision making (see chart for details).  Review of the  [place] CSRS was performed in accordance of the [place] prior to dispensing any controlled drugs.         Assessment and Plan: Fall:  Patient presents to the emergency department with a right ear laceration after patient tripped and lost her balance after she tripped over her grandkids this morning and fell against a wall.  CT head reveals no evidence of intracranial hemorrhage or skull fracture.  Patient underwent laceration repair without complication.  She was advised to have sutures removed by primary care in 1 week.  All patient questions were answered.    ____________________________________________  FINAL CLINICAL IMPRESSION(S) / ED DIAGNOSES  Final diagnoses:  Fall, initial encounter      NEW MEDICATIONS STARTED DURING THIS VISIT:  ED Discharge Orders         Ordered    cephALEXin (KEFLEX) 500 MG capsule  2 times daily     12/15/18 2104              This chart was dictated using voice recognition software/Dragon. Despite best efforts to proofread, errors can occur which can change the meaning. Any change was purely unintentional.    Lannie Fields, PA-C 12/15/18 2140    Arta Silence, MD 12/15/18 2218

## 2018-12-16 DIAGNOSIS — Z992 Dependence on renal dialysis: Secondary | ICD-10-CM | POA: Diagnosis not present

## 2018-12-16 DIAGNOSIS — N186 End stage renal disease: Secondary | ICD-10-CM | POA: Diagnosis not present

## 2018-12-17 DIAGNOSIS — N186 End stage renal disease: Secondary | ICD-10-CM | POA: Diagnosis not present

## 2018-12-17 DIAGNOSIS — Z992 Dependence on renal dialysis: Secondary | ICD-10-CM | POA: Diagnosis not present

## 2018-12-18 ENCOUNTER — Other Ambulatory Visit: Payer: Self-pay

## 2018-12-18 ENCOUNTER — Ambulatory Visit (INDEPENDENT_AMBULATORY_CARE_PROVIDER_SITE_OTHER): Payer: Medicare Other | Admitting: Vascular Surgery

## 2018-12-18 VITALS — BP 153/74 | HR 76 | Resp 10 | Ht 65.0 in | Wt 193.0 lb

## 2018-12-18 DIAGNOSIS — Z7901 Long term (current) use of anticoagulants: Secondary | ICD-10-CM

## 2018-12-18 DIAGNOSIS — T829XXS Unspecified complication of cardiac and vascular prosthetic device, implant and graft, sequela: Secondary | ICD-10-CM

## 2018-12-18 DIAGNOSIS — N186 End stage renal disease: Secondary | ICD-10-CM

## 2018-12-18 DIAGNOSIS — I480 Paroxysmal atrial fibrillation: Secondary | ICD-10-CM

## 2018-12-18 DIAGNOSIS — Z992 Dependence on renal dialysis: Secondary | ICD-10-CM

## 2018-12-18 DIAGNOSIS — Z79899 Other long term (current) drug therapy: Secondary | ICD-10-CM

## 2018-12-18 DIAGNOSIS — I10 Essential (primary) hypertension: Secondary | ICD-10-CM

## 2018-12-18 DIAGNOSIS — E785 Hyperlipidemia, unspecified: Secondary | ICD-10-CM

## 2018-12-18 DIAGNOSIS — I120 Hypertensive chronic kidney disease with stage 5 chronic kidney disease or end stage renal disease: Secondary | ICD-10-CM | POA: Diagnosis not present

## 2018-12-18 NOTE — Progress Notes (Signed)
MRN : [id number]  Stacy Pham is a 85 y.o. (June 30, 1931) female who presents with chief complaint of  Chief Complaint  Patient presents with   Follow-up  .  History of Present Illness:  The patient is seen for evaluation of dialysis access.  The patient has a history of multiple failed accesses.  There have been accesses in both arms and in the thighs.    Current access is via a peritoneal catheter which is functioning well.    The patient denies amaurosis fugax or recent TIA symptoms. There are no recent neurological changes noted. The patient denies claudication symptoms or rest pain symptoms. The patient denies history of DVT, PE or superficial thrombophlebitis. The patient denies recent episodes of angina or shortness of breath.   Current Meds  Medication Sig   acetaminophen (TYLENOL 8 HOUR ARTHRITIS PAIN) 650 MG CR tablet Take 650 mg every 8 (eight) hours as needed by mouth for pain.   allopurinol (ZYLOPRIM) 100 MG tablet Take 1 tablet (100 mg total) by mouth at bedtime.   amiodarone (PACERONE) 200 MG tablet Take 1 tablet (200 mg) by mouth two times a day for 2 weeks, then decrease to 1 tablet (200 mg) by mouth once a day.   apixaban (ELIQUIS) 2.5 MG TABS tablet Take 1 tablet (2.5 mg total) by mouth 2 (two) times daily.   atorvastatin (LIPITOR) 20 MG tablet Take 1 tablet (20 mg total) by mouth every evening.   blood glucose meter kit and supplies KIT Please dispense either One Touch or Bayer Contour She must have one of these machines since she is on peritoneal dialysis. Use up to four times daily as directed. (FOR ICD-9 250.00, 250.01).   calcitRIOL (ROCALTROL) 0.25 MCG capsule Take 0.25 mcg by mouth daily.   cephALEXin (KEFLEX) 500 MG capsule Take 1 capsule (500 mg total) by mouth 2 (two) times daily for 7 days.   cinacalcet (SENSIPAR) 30 MG tablet Take 1 tablet (30 mg total) by mouth daily with supper. (Patient taking differently: Take 30 mg by mouth daily with  supper. With largest meal)   colchicine 0.6 MG tablet Take 1 tablet (0.6 mg total) by mouth daily as needed (for gout flare ups).   ferric citrate (AURYXIA) 1 GM 210 MG(Fe) tablet Take 210 mg by mouth 3 (three) times daily with meals.   gabapentin (NEURONTIN) 100 MG capsule Take 2 capsules (200 mg total) by mouth 3 (three) times daily.   glucose blood (ONETOUCH VERIO) test strip Test blood sugar four times daily. Dx 250.00, 250.01, E11.22, N18.6   HYDROcodone-acetaminophen (NORCO) 5-325 MG tablet Take 1-2 tablets by mouth every 6 (six) hours as needed for moderate pain or severe pain.   hydroxypropyl methylcellulose / hypromellose (ISOPTO TEARS / GONIOVISC) 2.5 % ophthalmic solution Place 1-2 drops 3 (three) times daily as needed into both eyes for dry eyes.   metoprolol tartrate (LOPRESSOR) 25 MG tablet    ONETOUCH DELICA LANCETS 83F MISC Test blood sugar four times daily. Dx 250.00, 250.01, E11.22, N18.6    Past Medical History:  Diagnosis Date   (HFpEF) heart failure with preserved ejection fraction ([place])    a. 10/2017 Echo: EF 60-65%, Gr1 DD, mild MR, mildly dil LA/RA. PASP 60mHg.   Arthritis    Back pain    Bronchitis    Cataract    Diabetes mellitus without complication ([facility]    Dialysis patient ([place]    ESRD (end stage renal disease) ([place]    a.  2018 - initially on HD but then transitioned to nightly PD in 08/2017.   Gout    Hypertension    PAF (paroxysmal atrial fibrillation) (HCC)    a. CHA2DS2VASc = 5-->eliquis 2.5 BID.    Past Surgical History:  Procedure Laterality Date   AV FISTULA PLACEMENT Right 09/13/2018   Procedure: INSERTION OF ARTERIOVENOUS (AV) GORE-TEX GRAFT ARM;  Surgeon: Katha Cabal, MD;  Location: [facility] ORS;  Service: Vascular;  Laterality: Right;   CAPD INSERTION N/A 06/08/2017   Procedure: LAPAROSCOPIC INSERTION CONTINUOUS AMBULATORY PERITONEAL DIALYSIS  (CAPD) CATHETER;  Surgeon: Katha Cabal, MD;  Location: [facility] ORS;   Service: Vascular;  Laterality: N/A;   CATARACT EXTRACTION, BILATERAL Bilateral    DIALYSIS/PERMA CATHETER INSERTION N/A 03/18/2017   Procedure: DIALYSIS/PERMA CATHETER INSERTION;  Surgeon: Katha Cabal, MD;  Location: [place] [facility];  Service: Cardiovascular;  Laterality: N/A;   DIALYSIS/PERMA CATHETER REMOVAL N/A 10/20/2017   Procedure: DIALYSIS/PERMA CATHETER REMOVAL;  Surgeon: Algernon Huxley, MD;  Location: [place] [facility] LAB;  Service: Cardiovascular;  Laterality: N/A;   EYE SURGERY      Social History Social History   Tobacco Use   Smoking status: Never Smoker   Smokeless tobacco: Never Used  Substance Use Topics   Alcohol use: No   Drug use: No    Family History Family History  Problem Relation Age of Onset   Hypertension Father     No Known Allergies   REVIEW OF SYSTEMS (Negative unless checked)  Constitutional: '[]' Weight loss  '[]' Fever  '[]' Chills Cardiac: '[]' Chest pain   '[]' Chest pressure   '[]' Palpitations   '[]' Shortness of breath when laying flat   '[]' Shortness of breath with exertion. Vascular:  '[]' Pain in legs with walking   '[]' Pain in legs at rest  '[]' History of DVT   '[]' Phlebitis   '[]' Swelling in legs   '[]' Varicose veins   '[]' Non-healing ulcers Pulmonary:   '[]' Uses home oxygen   '[]' Productive cough   '[]' Hemoptysis   '[]' Wheeze  '[]' COPD   '[]' Asthma Neurologic:  '[]' Dizziness   '[]' Seizures   '[]' History of stroke   '[]' History of TIA  '[]' Aphasia   '[]' Vissual changes   '[]' Weakness or numbness in arm   '[]' Weakness or numbness in leg Musculoskeletal:   '[]' Joint swelling   '[]' Joint pain   '[]' Low back pain Hematologic:  '[]' Easy bruising  '[]' Easy bleeding   '[]' Hypercoagulable state   '[]' Anemic Gastrointestinal:  '[]' Diarrhea   '[]' Vomiting  '[]' Gastroesophageal reflux/heartburn   '[]' Difficulty swallowing. Genitourinary:  '[x]' Chronic kidney disease   '[]' Difficult urination  '[]' Frequent urination   '[]' Blood in urine Skin:  '[]' Rashes   '[]' Ulcers  Psychological:  '[]' History of anxiety   '[]'  History of major  depression.  Physical Examination  Vitals:   12/18/18 1330  BP: (!) 153/74  Pulse: 76  Resp: 10  Weight: 193 lb (87.5 kg)  Height: '5\' 5"'  (1.651 m)   Body mass index is 32.12 kg/m. Gen: WD/WN, NAD Head: [place]/AT, No temporalis wasting.  Ear/Nose/Throat: Hearing grossly intact, nares w/o erythema or drainage Eyes: PER, EOMI, sclera nonicteric.  Neck: Supple, no large masses.   Pulmonary:  Good air movement, no audible wheezing bilaterally, no use of accessory muscles.  Cardiac: RRR, no JVD Vascular:  Right arm brach ax graft good thrill good bruit Vessel Right Left  Radial Palpable Palpable  Gastrointestinal: Non-distended. No guarding/no peritoneal signs.  Musculoskeletal: M/S 5/5 throughout.  No deformity or atrophy.  Neurologic: CN 2-12 intact. Symmetrical.  Speech is fluent. Motor exam as listed above.  Psychiatric: Judgment intact, Mood & affect appropriate for pt's clinical situation. Dermatologic: No rashes or ulcers noted.  No changes consistent with cellulitis. Lymph : No lichenification or skin changes of chronic lymphedema.  CBC Lab Results  Component Value Date   WBC 10.9 (H) 08/21/2018   HGB 11.2 (L) 09/13/2018   HCT 33.0 (L) 09/13/2018   MCV 104.2 (H) 08/21/2018   PLT 367 08/21/2018    BMET    Component Value Date/Time   NA 134 (L) 09/13/2018 1150   K 4.2 09/13/2018 1150   CL 92 (L) 08/21/2018 1320   CO2 28 08/21/2018 1320   GLUCOSE 127 (H) 09/13/2018 1150   BUN 38 (H) 08/21/2018 1320   CREATININE 12.23 (H) 08/21/2018 1320   CALCIUM 7.8 (L) 08/21/2018 1320   GFRNONAA 2 (L) 08/21/2018 1320   GFRAA 3 (L) 08/21/2018 1320   CrCl cannot be calculated (Patient's most recent lab result is older than the maximum 21 days allowed.).  COAG Lab Results  Component Value Date   INR 1.45 08/21/2018   INR 1.43 06/16/2018   INR 1.08 06/08/2017    Radiology Ct Head Wo Contrast  Result Date: 12/15/2018 CLINICAL DATA:  Pain status post fall EXAM: CT HEAD WITHOUT  CONTRAST TECHNIQUE: Contiguous axial images were obtained from the base of the skull through the vertex without intravenous contrast. COMPARISON:  None. FINDINGS: Brain: No evidence of acute infarction, hemorrhage, hydrocephalus, extra-axial collection or mass lesion/mass effect. Vascular: No hyperdense vessel or unexpected calcification. Skull: Normal. Negative for fracture or focal lesion. Sinuses/Orbits: No acute finding. The patient is status post bilateral cataract surgery. Other: None. IMPRESSION: No acute intracranial abnormality detected. Electronically Signed   By: Constance Holster M.D.   On: 12/15/2018 20:28     Assessment/Plan 1. Complication from renal dialysis device, sequela Recommend:  The patient is doing well and currently has adequate dialysis access. The patient's dialysis center is not reporting any access issues. Flow pattern is stable when compared to the prior ultrasound.  The patient should have a duplex ultrasound of the dialysis access in 6 months. The patient will follow-up with me in the office after each ultrasound   - VAS [place] (AVF, AVG); Future  2. Stage 5 chronic kidney disease on chronic dialysis ([place]) See #1  3. Paroxysmal atrial fibrillation (HCC) Continue antiarrhythmia medications as already ordered, these medications have been reviewed and there are no changes at this time.  Continue anticoagulation as ordered by Cardiology Service   4. Hypertension, unspecified type Continue antihypertensive medications as already ordered, these medications have been reviewed and there are no changes at this time.   5. Hyperlipidemia, unspecified hyperlipidemia type Continue statin as ordered and reviewed, no changes at this time    Hortencia Pilar, MD  12/18/2018 1:50 PM

## 2018-12-19 DIAGNOSIS — Z992 Dependence on renal dialysis: Secondary | ICD-10-CM | POA: Diagnosis not present

## 2018-12-19 DIAGNOSIS — N186 End stage renal disease: Secondary | ICD-10-CM | POA: Diagnosis not present

## 2018-12-20 ENCOUNTER — Encounter (INDEPENDENT_AMBULATORY_CARE_PROVIDER_SITE_OTHER): Payer: Self-pay | Admitting: Vascular Surgery

## 2018-12-20 DIAGNOSIS — Z992 Dependence on renal dialysis: Secondary | ICD-10-CM | POA: Diagnosis not present

## 2018-12-20 DIAGNOSIS — T829XXA Unspecified complication of cardiac and vascular prosthetic device, implant and graft, initial encounter: Secondary | ICD-10-CM | POA: Insufficient documentation

## 2018-12-20 DIAGNOSIS — N186 End stage renal disease: Secondary | ICD-10-CM | POA: Diagnosis not present

## 2018-12-21 DIAGNOSIS — Z992 Dependence on renal dialysis: Secondary | ICD-10-CM | POA: Diagnosis not present

## 2018-12-21 DIAGNOSIS — N186 End stage renal disease: Secondary | ICD-10-CM | POA: Diagnosis not present

## 2018-12-22 ENCOUNTER — Encounter: Payer: Self-pay | Admitting: Family Medicine

## 2018-12-22 ENCOUNTER — Other Ambulatory Visit: Payer: Self-pay

## 2018-12-22 ENCOUNTER — Ambulatory Visit (INDEPENDENT_AMBULATORY_CARE_PROVIDER_SITE_OTHER): Payer: Medicare Other | Admitting: Family Medicine

## 2018-12-22 VITALS — BP 142/80 | HR 70 | Temp 97.8°F | Wt 193.0 lb

## 2018-12-22 DIAGNOSIS — Z4802 Encounter for removal of sutures: Secondary | ICD-10-CM

## 2018-12-22 DIAGNOSIS — Z992 Dependence on renal dialysis: Secondary | ICD-10-CM | POA: Diagnosis not present

## 2018-12-22 DIAGNOSIS — I10 Essential (primary) hypertension: Secondary | ICD-10-CM | POA: Diagnosis not present

## 2018-12-22 DIAGNOSIS — M792 Neuralgia and neuritis, unspecified: Secondary | ICD-10-CM | POA: Diagnosis not present

## 2018-12-22 DIAGNOSIS — I480 Paroxysmal atrial fibrillation: Secondary | ICD-10-CM | POA: Diagnosis not present

## 2018-12-22 DIAGNOSIS — N186 End stage renal disease: Secondary | ICD-10-CM | POA: Diagnosis not present

## 2018-12-22 MED ORDER — METOPROLOL TARTRATE 25 MG PO TABS: 25.0000 mg | ORAL_TABLET | Freq: Every day | ORAL | 1 refills | Status: DC

## 2018-12-22 NOTE — Progress Notes (Signed)
Subjective:    Patient ID: Stacy Pham, female    DOB: 1930-10-10, 85 y.o.   MRN: [id number]  HPI  This is an 85 yo female, accompanied by her  She presents today for staple removal. She fell 12/15/18 and want to ER and had right ear laceration repair. She had negative head CT.   On gabapentin 200 mg TID for peripheral neuropathy, no relief, feels like pain in feet worse.    Past Medical History:  Diagnosis Date  . (HFpEF) heart failure with preserved ejection fraction ([place])    a. 10/2017 Echo: EF 60-65%, Gr1 DD, mild MR, mildly dil LA/RA. PASP 61mmHg.  . Arthritis   . Back pain   . Bronchitis   . Cataract   . Diabetes mellitus without complication ([place])   . Dialysis patient ([place])   . ESRD (end stage renal disease) ([place])    a. 2018 - initially on HD but then transitioned to nightly PD in 08/2017.  [name] Gout   . Hypertension   . PAF (paroxysmal atrial fibrillation) (HCC)    a. CHA2DS2VASc = 5-->eliquis 2.5 BID.   Past Surgical History:  Procedure Laterality Date  . AV FISTULA PLACEMENT Right 09/13/2018   Procedure: INSERTION OF ARTERIOVENOUS (AV) GORE-TEX GRAFT ARM;  Surgeon: Katha Cabal, MD;  Location: [facility] ORS;  Service: Vascular;  Laterality: Right;  . CAPD INSERTION N/A 06/08/2017   Procedure: LAPAROSCOPIC INSERTION CONTINUOUS AMBULATORY PERITONEAL DIALYSIS  (CAPD) CATHETER;  Surgeon: Katha Cabal, MD;  Location: [facility] ORS;  Service: Vascular;  Laterality: N/A;  . CATARACT EXTRACTION, BILATERAL Bilateral   . DIALYSIS/PERMA CATHETER INSERTION N/A 03/18/2017   Procedure: DIALYSIS/PERMA CATHETER INSERTION;  Surgeon: Katha Cabal, MD;  Location: [place] [facility];  Service: Cardiovascular;  Laterality: N/A;  . DIALYSIS/PERMA CATHETER REMOVAL N/A 10/20/2017   Procedure: DIALYSIS/PERMA CATHETER REMOVAL;  Surgeon: Algernon Huxley, MD;  Location: [place] [facility];  Service: Cardiovascular;  Laterality: N/A;  . EYE SURGERY     Family History  Problem Relation Age of  Onset  . Hypertension Father    Social History   Tobacco Use  . Smoking status: Never Smoker  . Smokeless tobacco: Never Used  Substance Use Topics  . Alcohol use: No  . Drug use: No      Review of Systems Per HPI    Objective:   Physical Exam Vitals signs reviewed.  Constitutional:      General: She is not in acute distress.    Appearance: She is obese. She is not ill-appearing, toxic-appearing or diaphoretic.  HENT:     Head: Normocephalic and atraumatic.     Ears:     Comments: Repaired laceration of right external ear. Edges well appoximated. Area of scabbing removed with small amount bleeding. Six sutures removed. Small dressing applied.  Eyes:     Conjunctiva/sclera: Conjunctivae normal.  Cardiovascular:     Rate and Rhythm: Normal rate.  Pulmonary:     Effort: Pulmonary effort is normal.  Neurological:     Mental Status: She is alert and oriented to person, place, and time.  Psychiatric:        Mood and Affect: Mood normal.        Behavior: Behavior normal.        Thought Content: Thought content normal.        Judgment: Judgment normal.       BP (!) 142/80   Pulse 70   Temp 97.8 F (36.6 C) (  Oral)   Wt 193 lb (87.5 kg)   SpO2 97%   BMI 32.12 kg/m  Wt Readings from Last 3 Encounters:  12/22/18 193 lb (87.5 kg)  12/18/18 193 lb (87.5 kg)  12/15/18 192 lb (87.1 kg)       Assessment & Plan:  1. Stage 5 chronic kidney disease on chronic dialysis ([place]) - continued follow up with nephrology, she is transitioning from PD back to HD  2. Visit for suture removal - Follow up instructions provided to patient  3. Hypertension, unspecified type - metoprolol tartrate (LOPRESSOR) 25 MG tablet; Take 1 tablet (25 mg total) by mouth daily.  Dispense: 90 tablet; Refill: 1  4. Neuropathic pain - patient feels like it is worse on gabapentin, will wean over the next couple of weeks and re evaluate at follow up  5. Paroxysmal atrial fibrillation (HCC) -  metoprolol tartrate (LOPRESSOR) 25 MG tablet; Take 1 tablet (25 mg total) by mouth daily.  Dispense: 90 tablet; Refill: 1  - follow up in 3 months  Clarene Reamer, FNP-BC  [facility], [place]  12/22/2018 11:50 AM

## 2018-12-22 NOTE — Patient Instructions (Signed)
We are going to wean your Gabapentin  Decrease to 2 tablets a day for 1 week, then 1 tablet a day for 1 week, then stop.

## 2018-12-23 DIAGNOSIS — Z992 Dependence on renal dialysis: Secondary | ICD-10-CM | POA: Diagnosis not present

## 2018-12-23 DIAGNOSIS — N186 End stage renal disease: Secondary | ICD-10-CM | POA: Diagnosis not present

## 2018-12-24 DIAGNOSIS — N186 End stage renal disease: Secondary | ICD-10-CM | POA: Diagnosis not present

## 2018-12-24 DIAGNOSIS — Z992 Dependence on renal dialysis: Secondary | ICD-10-CM | POA: Diagnosis not present

## 2018-12-25 DIAGNOSIS — Z992 Dependence on renal dialysis: Secondary | ICD-10-CM | POA: Diagnosis not present

## 2018-12-25 DIAGNOSIS — N186 End stage renal disease: Secondary | ICD-10-CM | POA: Diagnosis not present

## 2018-12-26 ENCOUNTER — Other Ambulatory Visit: Payer: Self-pay | Admitting: Nephrology

## 2018-12-26 DIAGNOSIS — N186 End stage renal disease: Secondary | ICD-10-CM | POA: Diagnosis not present

## 2018-12-26 DIAGNOSIS — Z992 Dependence on renal dialysis: Secondary | ICD-10-CM | POA: Diagnosis not present

## 2018-12-28 DIAGNOSIS — D631 Anemia in chronic kidney disease: Secondary | ICD-10-CM | POA: Diagnosis not present

## 2018-12-28 DIAGNOSIS — D509 Iron deficiency anemia, unspecified: Secondary | ICD-10-CM | POA: Diagnosis not present

## 2018-12-28 DIAGNOSIS — Z992 Dependence on renal dialysis: Secondary | ICD-10-CM | POA: Diagnosis not present

## 2018-12-28 DIAGNOSIS — N2581 Secondary hyperparathyroidism of renal origin: Secondary | ICD-10-CM | POA: Diagnosis not present

## 2018-12-28 DIAGNOSIS — N186 End stage renal disease: Secondary | ICD-10-CM | POA: Diagnosis not present

## 2018-12-30 DIAGNOSIS — N186 End stage renal disease: Secondary | ICD-10-CM | POA: Diagnosis not present

## 2018-12-30 DIAGNOSIS — D509 Iron deficiency anemia, unspecified: Secondary | ICD-10-CM | POA: Diagnosis not present

## 2018-12-30 DIAGNOSIS — Z992 Dependence on renal dialysis: Secondary | ICD-10-CM | POA: Diagnosis not present

## 2018-12-30 DIAGNOSIS — D631 Anemia in chronic kidney disease: Secondary | ICD-10-CM | POA: Diagnosis not present

## 2018-12-30 DIAGNOSIS — N2581 Secondary hyperparathyroidism of renal origin: Secondary | ICD-10-CM | POA: Diagnosis not present

## 2018-12-31 DIAGNOSIS — D631 Anemia in chronic kidney disease: Secondary | ICD-10-CM | POA: Diagnosis not present

## 2018-12-31 DIAGNOSIS — N186 End stage renal disease: Secondary | ICD-10-CM | POA: Diagnosis not present

## 2018-12-31 DIAGNOSIS — D509 Iron deficiency anemia, unspecified: Secondary | ICD-10-CM | POA: Diagnosis not present

## 2018-12-31 DIAGNOSIS — N2581 Secondary hyperparathyroidism of renal origin: Secondary | ICD-10-CM | POA: Diagnosis not present

## 2018-12-31 DIAGNOSIS — Z992 Dependence on renal dialysis: Secondary | ICD-10-CM | POA: Diagnosis not present

## 2019-01-02 DIAGNOSIS — N2581 Secondary hyperparathyroidism of renal origin: Secondary | ICD-10-CM | POA: Diagnosis not present

## 2019-01-02 DIAGNOSIS — D509 Iron deficiency anemia, unspecified: Secondary | ICD-10-CM | POA: Diagnosis not present

## 2019-01-02 DIAGNOSIS — Z992 Dependence on renal dialysis: Secondary | ICD-10-CM | POA: Diagnosis not present

## 2019-01-02 DIAGNOSIS — N186 End stage renal disease: Secondary | ICD-10-CM | POA: Diagnosis not present

## 2019-01-02 DIAGNOSIS — D631 Anemia in chronic kidney disease: Secondary | ICD-10-CM | POA: Diagnosis not present

## 2019-01-03 DIAGNOSIS — N186 End stage renal disease: Secondary | ICD-10-CM | POA: Diagnosis not present

## 2019-01-03 DIAGNOSIS — D509 Iron deficiency anemia, unspecified: Secondary | ICD-10-CM | POA: Diagnosis not present

## 2019-01-03 DIAGNOSIS — D631 Anemia in chronic kidney disease: Secondary | ICD-10-CM | POA: Diagnosis not present

## 2019-01-03 DIAGNOSIS — Z992 Dependence on renal dialysis: Secondary | ICD-10-CM | POA: Diagnosis not present

## 2019-01-03 DIAGNOSIS — N2581 Secondary hyperparathyroidism of renal origin: Secondary | ICD-10-CM | POA: Diagnosis not present

## 2019-01-04 DIAGNOSIS — Z992 Dependence on renal dialysis: Secondary | ICD-10-CM | POA: Diagnosis not present

## 2019-01-04 DIAGNOSIS — N186 End stage renal disease: Secondary | ICD-10-CM | POA: Diagnosis not present

## 2019-01-04 DIAGNOSIS — D509 Iron deficiency anemia, unspecified: Secondary | ICD-10-CM | POA: Diagnosis not present

## 2019-01-04 DIAGNOSIS — N2581 Secondary hyperparathyroidism of renal origin: Secondary | ICD-10-CM | POA: Diagnosis not present

## 2019-01-04 DIAGNOSIS — D631 Anemia in chronic kidney disease: Secondary | ICD-10-CM | POA: Diagnosis not present

## 2019-01-09 DIAGNOSIS — D631 Anemia in chronic kidney disease: Secondary | ICD-10-CM | POA: Diagnosis not present

## 2019-01-09 DIAGNOSIS — N186 End stage renal disease: Secondary | ICD-10-CM | POA: Diagnosis not present

## 2019-01-09 DIAGNOSIS — N2581 Secondary hyperparathyroidism of renal origin: Secondary | ICD-10-CM | POA: Diagnosis not present

## 2019-01-09 DIAGNOSIS — Z992 Dependence on renal dialysis: Secondary | ICD-10-CM | POA: Diagnosis not present

## 2019-01-09 DIAGNOSIS — D509 Iron deficiency anemia, unspecified: Secondary | ICD-10-CM | POA: Diagnosis not present

## 2019-01-10 DIAGNOSIS — N2581 Secondary hyperparathyroidism of renal origin: Secondary | ICD-10-CM | POA: Diagnosis not present

## 2019-01-10 DIAGNOSIS — N186 End stage renal disease: Secondary | ICD-10-CM | POA: Diagnosis not present

## 2019-01-10 DIAGNOSIS — D509 Iron deficiency anemia, unspecified: Secondary | ICD-10-CM | POA: Diagnosis not present

## 2019-01-10 DIAGNOSIS — D631 Anemia in chronic kidney disease: Secondary | ICD-10-CM | POA: Diagnosis not present

## 2019-01-10 DIAGNOSIS — Z992 Dependence on renal dialysis: Secondary | ICD-10-CM | POA: Diagnosis not present

## 2019-01-11 DIAGNOSIS — Z992 Dependence on renal dialysis: Secondary | ICD-10-CM | POA: Diagnosis not present

## 2019-01-11 DIAGNOSIS — D509 Iron deficiency anemia, unspecified: Secondary | ICD-10-CM | POA: Diagnosis not present

## 2019-01-11 DIAGNOSIS — N2581 Secondary hyperparathyroidism of renal origin: Secondary | ICD-10-CM | POA: Diagnosis not present

## 2019-01-11 DIAGNOSIS — D631 Anemia in chronic kidney disease: Secondary | ICD-10-CM | POA: Diagnosis not present

## 2019-01-11 DIAGNOSIS — N186 End stage renal disease: Secondary | ICD-10-CM | POA: Diagnosis not present

## 2019-01-18 ENCOUNTER — Observation Stay
Admission: EM | Admit: 2019-01-18 | Discharge: 2019-01-19 | Disposition: A | Discharge status: Home / Self Care | Payer: Medicare Other | Attending: Internal Medicine | Admitting: Internal Medicine

## 2019-01-18 ENCOUNTER — Emergency Department: Payer: Medicare Other

## 2019-01-18 ENCOUNTER — Other Ambulatory Visit: Payer: Self-pay

## 2019-01-18 ENCOUNTER — Encounter: Payer: Self-pay | Admitting: Emergency Medicine

## 2019-01-18 DIAGNOSIS — I480 Paroxysmal atrial fibrillation: Secondary | ICD-10-CM | POA: Insufficient documentation

## 2019-01-18 DIAGNOSIS — Z8249 Family history of ischemic heart disease and other diseases of the circulatory system: Secondary | ICD-10-CM | POA: Insufficient documentation

## 2019-01-18 DIAGNOSIS — M549 Dorsalgia, unspecified: Secondary | ICD-10-CM | POA: Diagnosis not present

## 2019-01-18 DIAGNOSIS — I10 Essential (primary) hypertension: Secondary | ICD-10-CM | POA: Diagnosis not present

## 2019-01-18 DIAGNOSIS — R0989 Other specified symptoms and signs involving the circulatory and respiratory systems: Secondary | ICD-10-CM | POA: Diagnosis not present

## 2019-01-18 DIAGNOSIS — D631 Anemia in chronic kidney disease: Secondary | ICD-10-CM | POA: Insufficient documentation

## 2019-01-18 DIAGNOSIS — Z7901 Long term (current) use of anticoagulants: Secondary | ICD-10-CM | POA: Insufficient documentation

## 2019-01-18 DIAGNOSIS — I132 Hypertensive heart and chronic kidney disease with heart failure and with stage 5 chronic kidney disease, or end stage renal disease: Secondary | ICD-10-CM | POA: Diagnosis not present

## 2019-01-18 DIAGNOSIS — N2581 Secondary hyperparathyroidism of renal origin: Secondary | ICD-10-CM | POA: Diagnosis not present

## 2019-01-18 DIAGNOSIS — I288 Other diseases of pulmonary vessels: Secondary | ICD-10-CM | POA: Diagnosis not present

## 2019-01-18 DIAGNOSIS — I120 Hypertensive chronic kidney disease with stage 5 chronic kidney disease or end stage renal disease: Secondary | ICD-10-CM | POA: Diagnosis not present

## 2019-01-18 DIAGNOSIS — R262 Difficulty in walking, not elsewhere classified: Secondary | ICD-10-CM | POA: Diagnosis not present

## 2019-01-18 DIAGNOSIS — E875 Hyperkalemia: Secondary | ICD-10-CM | POA: Diagnosis not present

## 2019-01-18 DIAGNOSIS — I5032 Chronic diastolic (congestive) heart failure: Secondary | ICD-10-CM | POA: Insufficient documentation

## 2019-01-18 DIAGNOSIS — Z992 Dependence on renal dialysis: Secondary | ICD-10-CM | POA: Insufficient documentation

## 2019-01-18 DIAGNOSIS — Z79899 Other long term (current) drug therapy: Secondary | ICD-10-CM | POA: Insufficient documentation

## 2019-01-18 DIAGNOSIS — R531 Weakness: Secondary | ICD-10-CM | POA: Diagnosis not present

## 2019-01-18 DIAGNOSIS — R29898 Other symptoms and signs involving the musculoskeletal system: Secondary | ICD-10-CM | POA: Diagnosis not present

## 2019-01-18 DIAGNOSIS — R001 Bradycardia, unspecified: Secondary | ICD-10-CM | POA: Diagnosis not present

## 2019-01-18 DIAGNOSIS — E785 Hyperlipidemia, unspecified: Secondary | ICD-10-CM | POA: Diagnosis not present

## 2019-01-18 DIAGNOSIS — M109 Gout, unspecified: Secondary | ICD-10-CM | POA: Insufficient documentation

## 2019-01-18 DIAGNOSIS — E1122 Type 2 diabetes mellitus with diabetic chronic kidney disease: Secondary | ICD-10-CM | POA: Diagnosis not present

## 2019-01-18 DIAGNOSIS — M1990 Unspecified osteoarthritis, unspecified site: Secondary | ICD-10-CM | POA: Diagnosis not present

## 2019-01-18 DIAGNOSIS — N186 End stage renal disease: Secondary | ICD-10-CM

## 2019-01-18 DIAGNOSIS — I700 Atherosclerosis of aorta: Secondary | ICD-10-CM | POA: Diagnosis not present

## 2019-01-18 DIAGNOSIS — M6281 Muscle weakness (generalized): Secondary | ICD-10-CM | POA: Diagnosis not present

## 2019-01-18 DIAGNOSIS — R200 Anesthesia of skin: Secondary | ICD-10-CM | POA: Diagnosis not present

## 2019-01-18 DIAGNOSIS — M79676 Pain in unspecified toe(s): Secondary | ICD-10-CM | POA: Diagnosis not present

## 2019-01-18 DIAGNOSIS — Z1159 Encounter for screening for other viral diseases: Secondary | ICD-10-CM | POA: Insufficient documentation

## 2019-01-18 LAB — POTASSIUM
Potassium: 3.9 mmol/L (ref 3.5–5.1)
Potassium: 4.3 mmol/L (ref 3.5–5.1)
Potassium: 4.6 mmol/L (ref 3.5–5.1)

## 2019-01-18 LAB — CBC WITH DIFFERENTIAL/PLATELET
Abs Immature Granulocytes: 0.03 10*3/uL (ref 0.00–0.07)
Basophils Absolute: 0.1 10*3/uL (ref 0.0–0.1)
Basophils Relative: 1 %
Eosinophils Absolute: 0.1 10*3/uL (ref 0.0–0.5)
Eosinophils Relative: 1 %
HCT: 28.5 % — ABNORMAL LOW (ref 36.0–46.0)
Hemoglobin: 8.9 g/dL — ABNORMAL LOW (ref 12.0–15.0)
Immature Granulocytes: 0 %
Lymphocytes Relative: 21 %
Lymphs Abs: 1.7 10*3/uL (ref 0.7–4.0)
MCH: 33 pg (ref 26.0–34.0)
MCHC: 31.2 g/dL (ref 30.0–36.0)
MCV: 105.6 fL — ABNORMAL HIGH (ref 80.0–100.0)
Monocytes Absolute: 0.6 10*3/uL (ref 0.1–1.0)
Monocytes Relative: 8 %
Neutro Abs: 5.5 10*3/uL (ref 1.7–7.7)
Neutrophils Relative %: 69 %
Platelets: 360 10*3/uL (ref 150–400)
RBC: 2.7 MIL/uL — ABNORMAL LOW (ref 3.87–5.11)
RDW: 13.8 % (ref 11.5–15.5)
WBC: 8.1 10*3/uL (ref 4.0–10.5)
nRBC: 0 % (ref 0.0–0.2)

## 2019-01-18 LAB — COMPREHENSIVE METABOLIC PANEL
ALT: 18 U/L (ref 0–44)
AST: 21 U/L (ref 15–41)
Albumin: 3.8 g/dL (ref 3.5–5.0)
Alkaline Phosphatase: 106 U/L (ref 38–126)
Anion gap: 20 — ABNORMAL HIGH (ref 5–15)
BUN: 70 mg/dL — ABNORMAL HIGH (ref 8–23)
CO2: 21 mmol/L — ABNORMAL LOW (ref 22–32)
Calcium: 6.8 mg/dL — ABNORMAL LOW (ref 8.9–10.3)
Chloride: 96 mmol/L — ABNORMAL LOW (ref 98–111)
Creatinine, Ser: 18.3 mg/dL — ABNORMAL HIGH (ref 0.44–1.00)
GFR calc Af Amer: 2 mL/min — ABNORMAL LOW (ref >60)
GFR calc non Af Amer: 2 mL/min — ABNORMAL LOW (ref >60)
Glucose, Bld: 124 mg/dL — ABNORMAL HIGH (ref 70–99)
Potassium: 7.3 mmol/L (ref 3.5–5.1)
Sodium: 137 mmol/L (ref 135–145)
Total Bilirubin: 0.9 mg/dL (ref 0.3–1.2)
Total Protein: 7.6 g/dL (ref 6.5–8.1)

## 2019-01-18 LAB — GLUCOSE, CAPILLARY
Glucose-Capillary: 113 mg/dL — ABNORMAL HIGH (ref 70–99)
Glucose-Capillary: 124 mg/dL — ABNORMAL HIGH (ref 70–99)
Glucose-Capillary: 78 mg/dL (ref 70–99)
Glucose-Capillary: 98 mg/dL (ref 70–99)

## 2019-01-18 LAB — SARS CORONAVIRUS 2 BY RT PCR (HOSPITAL ORDER, PERFORMED IN ~~LOC~~ HOSPITAL LAB): SARS Coronavirus 2: NEGATIVE

## 2019-01-18 LAB — URIC ACID: Uric Acid, Serum: 8.1 mg/dL — ABNORMAL HIGH (ref 2.5–7.1)

## 2019-01-18 LAB — MRSA PCR SCREENING: MRSA by PCR: NEGATIVE

## 2019-01-18 MED ORDER — SODIUM BICARBONATE 8.4 % IV SOLN
50.0000 meq | Freq: Once | INTRAVENOUS | Status: AC
  Administered 2019-01-18: 50 meq via INTRAVENOUS
  Filled 2019-01-18: qty 50

## 2019-01-18 MED ORDER — METOPROLOL TARTRATE 25 MG PO TABS
25.0000 mg | ORAL_TABLET | Freq: Every day | ORAL | Status: DC
  Administered 2019-01-19: 09:00:00 25 mg via ORAL
  Filled 2019-01-18: qty 1

## 2019-01-18 MED ORDER — APIXABAN 2.5 MG PO TABS
2.5000 mg | ORAL_TABLET | Freq: Two times a day (BID) | ORAL | Status: DC
  Administered 2019-01-18 – 2019-01-19 (×2): 2.5 mg via ORAL
  Filled 2019-01-18 (×2): qty 1

## 2019-01-18 MED ORDER — SODIUM CHLORIDE 0.9 % IV SOLN: 1.0000 g | Freq: Once | INTRAVENOUS | Status: DC

## 2019-01-18 MED ORDER — INSULIN ASPART 100 UNIT/ML ~~LOC~~ SOLN: 0.0000 [IU] | Freq: Every day | SUBCUTANEOUS | Status: DC

## 2019-01-18 MED ORDER — PREDNISONE 20 MG PO TABS
20.0000 mg | ORAL_TABLET | Freq: Every day | ORAL | Status: DC
  Administered 2019-01-19: 20 mg via ORAL
  Filled 2019-01-18: qty 1

## 2019-01-18 MED ORDER — CALCITRIOL 0.25 MCG PO CAPS
0.2500 ug | ORAL_CAPSULE | Freq: Every day | ORAL | Status: DC
  Filled 2019-01-18: qty 1

## 2019-01-18 MED ORDER — COLCHICINE 0.6 MG PO TABS: 0.6000 mg | ORAL_TABLET | Freq: Every day | ORAL | Status: DC | PRN

## 2019-01-18 MED ORDER — EPOETIN ALFA 10000 UNIT/ML IJ SOLN: 10000.0000 [IU] | INTRAMUSCULAR | Status: DC

## 2019-01-18 MED ORDER — SODIUM CHLORIDE 0.9 % IV SOLN
1.0000 g | Freq: Once | INTRAVENOUS | Status: DC
  Filled 2019-01-18: qty 10

## 2019-01-18 MED ORDER — CALCIUM GLUCONATE-NACL 1-0.675 GM/50ML-% IV SOLN
1.0000 g | Freq: Once | INTRAVENOUS | Status: DC
  Filled 2019-01-18: qty 50

## 2019-01-18 MED ORDER — CALCIUM GLUCONATE-NACL 1-0.675 GM/50ML-% IV SOLN
1.0000 g | Freq: Once | INTRAVENOUS | Status: AC
  Administered 2019-01-18: 12:00:00 1000 mg via INTRAVENOUS
  Filled 2019-01-18: qty 50

## 2019-01-18 MED ORDER — INSULIN ASPART 100 UNIT/ML ~~LOC~~ SOLN: 10.0000 [IU] | Freq: Once | SUBCUTANEOUS | Status: DC

## 2019-01-18 MED ORDER — ATORVASTATIN CALCIUM 20 MG PO TABS
20.0000 mg | ORAL_TABLET | Freq: Every evening | ORAL | Status: DC
  Administered 2019-01-18: 19:00:00 20 mg via ORAL
  Filled 2019-01-18: qty 1

## 2019-01-18 MED ORDER — ACETAMINOPHEN ER 650 MG PO TBCR: 650.0000 mg | EXTENDED_RELEASE_TABLET | Freq: Three times a day (TID) | ORAL | Status: DC | PRN

## 2019-01-18 MED ORDER — CINACALCET HCL 30 MG PO TABS
30.0000 mg | ORAL_TABLET | Freq: Every day | ORAL | Status: DC
  Filled 2019-01-18: qty 1

## 2019-01-18 MED ORDER — SODIUM BICARBONATE 8.4 % IV SOLN: 50.0000 meq | Freq: Once | INTRAVENOUS | Status: DC

## 2019-01-18 MED ORDER — COLCHICINE 0.6 MG PO TABS
0.6000 mg | ORAL_TABLET | Freq: Once | ORAL | Status: DC
  Filled 2019-01-18: qty 1

## 2019-01-18 MED ORDER — DEXTROSE 50 % IV SOLN: 25.0000 g | Freq: Once | INTRAVENOUS | Status: DC

## 2019-01-18 MED ORDER — FERRIC CITRATE 1 GM 210 MG(FE) PO TABS
210.0000 mg | ORAL_TABLET | Freq: Three times a day (TID) | ORAL | Status: DC
  Administered 2019-01-19: 210 mg via ORAL
  Filled 2019-01-18 (×4): qty 1

## 2019-01-18 MED ORDER — AMIODARONE HCL 200 MG PO TABS
200.0000 mg | ORAL_TABLET | Freq: Two times a day (BID) | ORAL | Status: DC
  Administered 2019-01-18 – 2019-01-19 (×2): 200 mg via ORAL
  Filled 2019-01-18 (×2): qty 1

## 2019-01-18 MED ORDER — ALLOPURINOL 100 MG PO TABS
100.0000 mg | ORAL_TABLET | Freq: Every day | ORAL | Status: DC
  Administered 2019-01-18: 100 mg via ORAL
  Filled 2019-01-18 (×3): qty 1

## 2019-01-18 MED ORDER — HEPARIN SODIUM (PORCINE) 5000 UNIT/ML IJ SOLN: 5000.0000 [IU] | Freq: Three times a day (TID) | INTRAMUSCULAR | Status: DC

## 2019-01-18 MED ORDER — INSULIN ASPART 100 UNIT/ML ~~LOC~~ SOLN
0.0000 [IU] | Freq: Three times a day (TID) | SUBCUTANEOUS | Status: DC
  Filled 2019-01-18: qty 1

## 2019-01-18 MED ORDER — ACETAMINOPHEN 325 MG PO TABS: 650.0000 mg | ORAL_TABLET | Freq: Three times a day (TID) | ORAL | Status: DC | PRN

## 2019-01-18 MED ORDER — CHLORHEXIDINE GLUCONATE 4 % EX LIQD
6.0000 "application " | Freq: Every day | CUTANEOUS | Status: DC
  Administered 2019-01-19: 6 via TOPICAL

## 2019-01-18 MED ORDER — GABAPENTIN 100 MG PO CAPS
200.0000 mg | ORAL_CAPSULE | Freq: Three times a day (TID) | ORAL | Status: DC
  Administered 2019-01-18 – 2019-01-19 (×3): 200 mg via ORAL
  Filled 2019-01-18 (×3): qty 2

## 2019-01-18 NOTE — Progress Notes (Signed)
Post Hd Assessment    01/18/19 1645  Neurological  Level of Consciousness Alert  Orientation Level Oriented X4  Respiratory  Respiratory Pattern Regular;Unlabored  Chest Assessment Chest expansion symmetrical  Bilateral Breath Sounds Diminished  Cough None  Cardiac  Pulse Regular  Heart Sounds S1, S2  ECG Monitor Yes  Cardiac Rhythm SB  Vascular  R Radial Pulse +2  L Radial Pulse +2  Edema Generalized  Psychosocial  Psychosocial (WDL) WDL

## 2019-01-18 NOTE — Progress Notes (Signed)
[facility]  ROUNDING NOTE   Subjective:   Ms. Shelise Maron admitted to [facility] on 01/18/2019 for Hyperkalemia [E87.5] End stage renal disease on dialysis [facility]) [N18.6, Z99.2]  Patient's last hemodialysis was 7/2. She has missed two outpatient treatments. She missed because she had a fall and was having toe pain. She is concerned that she has an acute gout flare.   Objective:  Vital signs in last 24 hours:  Temp:  [98.4 F (36.9 C)-98.6 F (37 C)] 98.4 F (36.9 C) (07/09 1237) Pulse Rate:  [51-54] 54 (07/09 1400) Resp:  [15-19] 15 (07/09 1400) BP: (149-193)/(57-74) 149/68 (07/09 1400) SpO2:  [93 %-98 %] 97 % (07/09 1400) Weight:  [86.2 kg] 86.2 kg (07/09 1237)  Weight change:  Filed Weights   01/18/19 1030 01/18/19 1237  Weight: 86.2 kg 86.2 kg    Intake/Output: No intake/output data recorded.   Intake/Output this shift:  No intake/output data recorded.  Physical Exam: General: NAD,   Head: Normocephalic, atraumatic. Moist oral mucosal membranes  Eyes: Anicteric, PERRL  Neck: Supple, trachea midline  Lungs:  Clear to auscultation  Heart: Regular rate and rhythm  Abdomen:  Soft, nontender,   Extremities:  + peripheral edema.  Neurologic: Nonfocal, moving all four extremities  Skin: No lesions  Access: Right AVG, PD catheter    Basic Metabolic Panel: Recent Labs  Lab 01/18/19 1045  NA 137  K 7.3*  CL 96*  CO2 21*  GLUCOSE 124*  BUN 70*  CREATININE 18.30*  CALCIUM 6.8*    Liver Function Tests: Recent Labs  Lab 01/18/19 1045  AST 21  ALT 18  ALKPHOS 106  BILITOT 0.9  PROT 7.6  ALBUMIN 3.8   No results for input(s): LIPASE, AMYLASE in the last 168 hours. No results for input(s): AMMONIA in the last 168 hours.  CBC: Recent Labs  Lab 01/18/19 1045  WBC 8.1  NEUTROABS 5.5  HGB 8.9*  HCT 28.5*  MCV 105.6*  PLT 360    Cardiac Enzymes: No results for input(s): CKTOTAL, CKMB, CKMBINDEX, TROPONINI in the last 168  hours.  BNP: Invalid input(s): POCBNP  CBG: Recent Labs  Lab 01/18/19 1047  [place]*    Microbiology: Results for orders placed or performed in visit on 05/18/17  Nasal culture     Status: None   Collection Time: 05/18/17 11:37 AM  Result Value Ref Range Status   MICRO NUMBER: [id number]  Final   SPECIMEN QUALITY: ADEQUATE  Final   SOURCE: NOSE  Final   STATUS: FINAL  Final   RESULT: Growth of normal oropharyngeal flora.  Final   COMMENT:   Final    We received a specimen from the nose/NP site with an order for an aerobic or aerobic/anaerobic culture. This body site is not appropriate for the test requested, therefore an aerobic nasopharyngeal/nasal culture was performed. If this is not what you  intended to order, please contact your local client service representative immediately so that we can adjust our billing appropriately. You may also inquire about alternative or additional testing.     Coagulation Studies: No results for input(s): LABPROT, INR in the last 72 hours.  Urinalysis: No results for input(s): COLORURINE, LABSPEC, PHURINE, GLUCOSEU, HGBUR, BILIRUBINUR, KETONESUR, PROTEINUR, UROBILINOGEN, NITRITE, LEUKOCYTESUR in the last 72 hours.  Invalid input(s): APPERANCEUR    Imaging: Dg Chest 1 View  Result Date: 01/18/2019 CLINICAL DATA:  Lower extremity weakness.  Chronic renal failure EXAM: CHEST  1 VIEW COMPARISON:  March 18, 2017 FINDINGS: There is cardiomegaly with mild pulmonary venous hypertension. There is mild pulmonary edema without airspace consolidation or volume loss. No appreciable pleural effusions. No adenopathy. There is aortic atherosclerosis. No bone lesions. IMPRESSION: Pulmonary vascular congestion with suspected degree of volume overload. No consolidation or pleural effusion. No adenopathy.  Aortic Atherosclerosis (ICD10-I70.0). Electronically Signed   By: Lowella Grip III M.D.   On: 01/18/2019 10:53     Medications:    .  allopurinol  100 mg Oral QHS  . amiodarone  200 mg Oral BID  . apixaban  2.5 mg Oral BID  . atorvastatin  20 mg Oral QPM  . calcitRIOL  0.25 mcg Oral Daily  . [START ON 01/19/2019] chlorhexidine  6 application Topical [id number]  . cinacalcet  30 mg Oral Q supper  . ferric citrate  210 mg Oral TID WC  . gabapentin  200 mg Oral TID  . insulin aspart  0-5 Units Subcutaneous QHS  . insulin aspart  0-9 Units Subcutaneous TID WC  . metoprolol tartrate  25 mg Oral Daily  . predniSONE  20 mg Oral Q breakfast   acetaminophen  Assessment/ Plan:  Ms. Maebry Obrien is a 85 y.o. black female with end stage renal disease on hemodialysis (recently changed from peritoneal dialysis), hypertension, gout, hyperlipidemia, diabetes mellitus type II, atrial fibrillation who presents to [facility] on 01/18/2019 for Hyperkalemia [E87.5] End stage renal disease on dialysis ([place]) [N18.6, Z99.2]  [facility] TTS [facility] left AVG 88kg   1. End Stage Renal Disease with hyperkalemia: missed two dialysis treatments. Last hemodialysis treatment was 7/2.  Emergent hemodialysis today for hyperkalemia.   2. Hypertension: 160/74. Home regimen of carvedilol. Placed on metoprolol during this admission.   3. Anemia of chronic kidney disease: hemoglobin 8.9. Macrocytic.  - EPO with HD treatment today  4. Secondary Hyperparathyroidism: labs from outpatient PTH 442, calcium 7.5, phosphorus 4.7. All at goal.  - Hold cinacalcet due to hypocalcemia - Discontinue calcitriol  - Auryxia with meals.   5. Acute gout flare - prednisone, allopurinol and colchicine   LOS: 0 Markeita Alicia 7/9/20202:12 PM

## 2019-01-18 NOTE — H&P (Addendum)
[place] [facility] [place] NAME: Stacy Pham    MR#:  [id number]  DATE OF BIRTH:  03/16/1931  DATE OF ADMISSION:  01/18/2019  PRIMARY CARE PHYSICIAN: Elby Beck, FNP   REQUESTING/REFERRING PHYSICIAN: Lenise Arena, MD  CHIEF COMPLAINT:   Chief Complaint  Patient presents with   Weakness    HISTORY OF PRESENT ILLNESS:  85 y.o. female with pertinent past medical history of CHF with preserved EF, PAF on anticoagulation, DM, ESRD on HD TThS, and hypertension presenting to the ED with c/o worsening weakness in bilateral legs worse in left leg since Tuesday.  Patient report that she was getting out of the car and she fell. Denies hitting her head or any injuries form the fall. She is also reporting numbness in her right upper extremities and fingers since her perm cath was placed. Denies associated altered sensorium, speech abnormality, cranial nerve deficit,  focal motor or sensory deficits, diplopia, nausea or vomiting, syncope or LOC, dizziness , headache or other focal neurological deficit. Patient state that she missed HD today due to fall.  On arrival to the ED, he was afebrile with blood pressure 193/74 mm Hg and pulse rate 52 beats/min. There were no focal neurological deficits; he was alert and oriented x4, and he did not demonstrate any memory deficits. Initial labs revealed hgb 8.9, hyperkalemia 7.3. Chest xray showed pulmonary vascular congestion with suspected degree of volume overload. She will be admitted under hospitalist service for further management.  PAST MEDICAL HISTORY:   Past Medical History:  Diagnosis Date   (HFpEF) heart failure with preserved ejection fraction ([place])    a. 10/2017 Echo: EF 60-65%, Gr1 DD, mild MR, mildly dil LA/RA. PASP 69mHg.   Arthritis    Back pain    Bronchitis    Cataract    Diabetes mellitus without complication ([place]    [place] patient ([facility]    ESRD (end stage renal disease)  ([place]    a. 2018 - initially on HD but then transitioned to nightly PD in 08/2017.   Gout    Hypertension    PAF (paroxysmal atrial fibrillation) (HCC)    a. CHA2DS2VASc = 5-->eliquis 2.5 BID.    PAST SURGICAL HISTORY:   Past Surgical History:  Procedure Laterality Date   AV FISTULA PLACEMENT Right 09/13/2018   Procedure: INSERTION OF ARTERIOVENOUS (AV) GORE-TEX GRAFT ARM;  Surgeon: SKatha Cabal MD;  Location: [facility] ORS;  Service: Vascular;  Laterality: Right;   CAPD INSERTION N/A 06/08/2017   Procedure: LAPAROSCOPIC INSERTION CONTINUOUS AMBULATORY PERITONEAL DIALYSIS  (CAPD) CATHETER;  Surgeon: SKatha Cabal MD;  Location: [facility] ORS;  Service: Vascular;  Laterality: N/A;   CATARACT EXTRACTION, BILATERAL Bilateral    DIALYSIS/PERMA CATHETER INSERTION N/A 03/18/2017   Procedure: DIALYSIS/PERMA CATHETER INSERTION;  Surgeon: SKatha Cabal MD;  [place];  Service: Cardiovascular;  Laterality: N/A;   DIALYSIS/PERMA CATHETER REMOVAL N/A 10/20/2017   Procedure: DIALYSIS/PERMA CATHETER REMOVAL;  Surgeon: DAlgernon Huxley MD;  [facility];  Service: Cardiovascular;  Laterality: N/A;   EYE SURGERY      SOCIAL HISTORY:   Social History   Tobacco Use   Smoking status: Never Smoker   Smokeless tobacco: Never Used  Substance Use Topics   Alcohol use: No    FAMILY HISTORY:   Family History  Problem Relation Age of Onset   Hypertension Father     DRUG ALLERGIES:  No  Known Allergies  REVIEW OF SYSTEMS:   Review of Systems  Constitutional: Negative for chills, fever, malaise/fatigue and weight loss.  HENT: Negative for congestion, hearing loss and sore throat.   Eyes: Negative for blurred vision and double vision.  Respiratory: Negative for cough, shortness of breath and wheezing.   Cardiovascular: Negative for chest pain, palpitations, orthopnea and leg swelling.  Gastrointestinal: Negative for abdominal pain, diarrhea,  nausea and vomiting.  Genitourinary: Negative for dysuria and urgency.  Musculoskeletal: Positive for back pain and joint pain. Negative for myalgias.       Left toe gout  Skin: Negative for rash.  Neurological: Positive for weakness. Negative for dizziness, sensory change, speech change, focal weakness and headaches.       Bilateral leg weakness left >right  Psychiatric/Behavioral: Negative for depression.   MEDICATIONS AT HOME:   Prior to Admission medications   Medication Sig Start Date End Date Taking? Authorizing Provider  allopurinol (ZYLOPRIM) 100 MG tablet Take 1 tablet (100 mg total) by mouth at bedtime. 09/08/18  Yes Elby Beck, FNP  amiodarone (PACERONE) 200 MG tablet Take 1 tablet (200 mg) by mouth two times a day for 2 weeks, then decrease to 1 tablet (200 mg) by mouth once a day. 08/22/18  Yes Theora Gianotti, NP  apixaban (ELIQUIS) 2.5 MG TABS tablet Take 1 tablet (2.5 mg total) by mouth 2 (two) times daily. 09/08/18  Yes Elby Beck, FNP  atorvastatin (LIPITOR) 20 MG tablet Take 1 tablet (20 mg total) by mouth every evening. 09/08/18  Yes Elby Beck, FNP  calcitRIOL (ROCALTROL) 0.25 MCG capsule Take 0.25 mcg by mouth daily.   Yes [provider]  cinacalcet (SENSIPAR) 30 MG tablet Take 1 tablet (30 mg total) by mouth daily with supper. Patient taking differently: Take 30 mg by mouth daily with supper. With largest meal 04/28/17  Yes [name], MD  colchicine 0.6 MG tablet Take 1 tablet (0.6 mg total) by mouth daily as needed (for gout flare ups). 09/08/18  Yes Elby Beck, FNP  gabapentin (NEURONTIN) 100 MG capsule Take 2 capsules (200 mg total) by mouth 3 (three) times daily. 09/08/18  Yes Elby Beck, FNP  metoprolol tartrate (LOPRESSOR) 25 MG tablet Take 1 tablet (25 mg total) by mouth daily. 12/22/18  Yes Elby Beck, FNP  acetaminophen (TYLENOL 8 HOUR ARTHRITIS PAIN) 650 MG CR tablet Take 650 mg every 8 (eight)  hours as needed by mouth for pain.    [provider]  blood glucose meter kit and supplies KIT Please dispense either One Touch or Bayer Contour She must have one of these machines since she is on peritoneal dialysis. Use up to four times daily as directed. (FOR ICD-9 250.00, 250.01). 09/23/17   Elby Beck, FNP  ferric citrate (AURYXIA) 1 GM 210 MG(Fe) tablet Take 210 mg by mouth 3 (three) times daily with meals.    [provider]  glucose blood (ONETOUCH VERIO) test strip Test blood sugar four times daily. Dx 250.00, 250.01, E11.22, N18.6 09/23/17   Elby Beck, FNP  HYDROcodone-acetaminophen (NORCO) 5-325 MG tablet Take 1-2 tablets by mouth every 6 (six) hours as needed for moderate pain or severe pain. 09/13/18   Schnier, Dolores Lory, MD  hydroxypropyl methylcellulose / hypromellose (ISOPTO TEARS / GONIOVISC) 2.5 % ophthalmic solution Place 1-2 drops 3 (three) times daily as needed into both eyes for dry eyes.    [provider]  [place]  33G MISC Test blood sugar four times daily. Dx 250.00, 250.01, E11.22, N18.6 09/23/17   Elby Beck, FNP      VITAL SIGNS:  Blood pressure (!) 193/74, pulse (!) 52, temperature 98.6 F (37 C), temperature source Oral, resp. rate 18, height 5' 3" (1.6 m), weight 86.2 kg, SpO2 97 %.  PHYSICAL EXAMINATION:   Physical Exam  GENERAL:  85 y.o.-year-old patient lying in the bed with no acute distress.  EYES: Pupils equal, round, reactive to light and accommodation. No scleral icterus. Extraocular muscles intact.  HEENT: Head atraumatic, normocephalic. Oropharynx and nasopharynx clear.  NECK:  Supple, no jugular venous distention. No thyroid enlargement, no tenderness.  LUNGS: Normal breath sounds bilaterally, no wheezing, rales,rhonchi or crepitation. No use of accessory muscles of respiration.  CARDIOVASCULAR: S1, S2 normal. No murmurs, rubs, or gallops.  ABDOMEN: Soft, nontender, nondistended. Bowel  sounds present. No organomegaly or mass.  EXTREMITIES: No pedal edema, cyanosis, or clubbing. No rash or lesions. + pedal pulses MUSCULOSKELETAL: Normal bulk, and power was 5+ grip and elbow, knee, and ankle flexion and extension bilaterally. Left great toe gout NEUROLOGIC:Alert and oriented x 3. CN 2-12 intact. Sensation to light touch and cold stimuli intact bilaterally. Finger to nose nl. Babinski is downgoing. DTR's (biceps, patellar, and achilles) 2+ and symmetric throughout. Gait not tested due to safety concern. PSYCHIATRIC: The patient is alert and oriented x 3.  SKIN: No obvious rash, lesion, or ulcer.   DATA REVIEWED:  LABORATORY PANEL:   CBC Recent Labs  Lab 01/18/19 1045  WBC 8.1  HGB 8.9*  HCT 28.5*  PLT 360   ------------------------------------------------------------------------------------------------------------------  Chemistries  Recent Labs  Lab 01/18/19 1045  NA 137  K 7.3*  CL 96*  CO2 21*  GLUCOSE 124*  BUN 70*  CREATININE 18.30*  CALCIUM 6.8*  AST 21  ALT 18  ALKPHOS 106  BILITOT 0.9   ------------------------------------------------------------------------------------------------------------------  Cardiac Enzymes No results for input(s): TROPONINI in the last 168 hours. ------------------------------------------------------------------------------------------------------------------  RADIOLOGY:  Dg Chest 1 View  Result Date: 01/18/2019 CLINICAL DATA:  Lower extremity weakness.  Chronic renal failure EXAM: CHEST  1 VIEW COMPARISON:  March 18, 2017 FINDINGS: There is cardiomegaly with mild pulmonary venous hypertension. There is mild pulmonary edema without airspace consolidation or volume loss. No appreciable pleural effusions. No adenopathy. There is aortic atherosclerosis. No bone lesions. IMPRESSION: Pulmonary vascular congestion with suspected degree of volume overload. No consolidation or pleural effusion. No adenopathy.  Aortic  Atherosclerosis (ICD10-I70.0). Electronically Signed   By: Lowella Grip III M.D.   On: 01/18/2019 10:53    EKG:  EKG: normal EKG, normal sinus rhythm, unchanged from previous tracings. Vent. rate 55 BPM PR interval * ms QRS duration 123 ms QT/QTc 527/505 ms P-R-T axes 42 -55 61 IMPRESSION AND PLAN:   85 y.o. female with pertinent past medical history of CHF with preserved EF, PAF on anticoagulation, DM, ESRD on HD TThS, and hypertension presenting to the ED with c/o worsening weakness in bilateral legs worse in left leg since Tuesday.  1. Bilateral leg weakness - Likely secondary to hyperkalemia and Gout? - Admit to telemetry unit - PT eval  2. Hyperkalemia - Received calcium gluconate in the ED - Pending HD - Recheck labs  3. Left great toe Gout - Check uric acid level - Give colchine - Will give one time Prednisone - PT eval as above  4. ESRD - on HD [facility] - Nephrology consult for HD  5. Afib-  on anticoagulation - Continue Eliquis  6. Chronic Diastolic CHF - Pending HD  7.  HLD = + Goal LDL<100 - Atorvastatin 66m PO qhs  8. HTN  + Goal BP <140/80 - Continue home meds   9. DM  - Start SSI  All the records are reviewed and case discussed with ED provider. Management plans discussed with the patient, family and they are in agreement.  CODE STATUS:FULL  TOTAL TIME TAKING CARE OF THIS PATIENT: 40 minutes.    on 01/18/2019 at 12:29 PM   ERufina Falco DNP, FNP-BC [facility] Nurse Practitioner Between 7am to 6pm - Pager -[phone] After 6pm go to [email or web address] - password [email or web address]  Office  [phone] CC: Primary care physician; GElby Beck FNP

## 2019-01-18 NOTE — ED Notes (Signed)
Date and time results received: 01/18/19 1147 (use smartphrase ".now" to insert current time)  Test: K+ Critical Value: >7.3  Name of Provider Notified: Lenise Herald  Orders Received? Or Actions Taken?: EDP acknowledged

## 2019-01-18 NOTE — ED Provider Notes (Signed)
[facility] Emergency Department Provider Note       Time seen: ----------------------------------------- 10:29 AM on 01/18/2019 -----------------------------------------   I have reviewed the triage vital signs and the nursing notes.  HISTORY   Chief Complaint Weakness    HPI Stacy Pham is a 85 y.o. female with a history of heart failure, arthritis, diabetes, end-stage renal disease on dialysis, hypertension, atrial fibrillation who presents to the ED for weakness.  Patient arrives by EMS with worsening weakness in her legs, worse in the left leg.  Patient states she has missed her last 2 dialysis treatments.  She missed her treatments because of weakness.  Past Medical History:  Diagnosis Date  . (HFpEF) heart failure with preserved ejection fraction ([place])    a. 10/2017 Echo: EF 60-65%, Gr1 DD, mild MR, mildly dil LA/RA. PASP 15mmHg.  . Arthritis   . Back pain   . Bronchitis   . Cataract   . Diabetes mellitus without complication ([place])   . Dialysis patient ([place])   . ESRD (end stage renal disease) ([place])    a. 2018 - initially on HD but then transitioned to nightly PD in 08/2017.  [name] Gout   . Hypertension   . PAF (paroxysmal atrial fibrillation) (HCC)    a. CHA2DS2VASc = 5-->eliquis 2.5 BID.    Patient Active Problem List   Diagnosis Date Noted  . Complication from renal dialysis device 12/20/2018  . Hyperlipidemia 05/03/2018  . Chronic heart failure with preserved ejection fraction ([place]) 12/28/2017  . 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 ESRD (end stage renal disease) ([place]) 08/25/2017  . Paroxysmal atrial fibrillation ([place]) 05/21/2017  . Hypertension 05/21/2017  . Type 2 diabetes mellitus with chronic kidney disease on chronic dialysis, without long-term current use of insulin ([place]) 05/21/2017  . Stage 5 chronic kidney disease on chronic dialysis ([place]) 05/21/2017  . Acute hyperkalemia 04/27/2017    Past Surgical  History:  Procedure Laterality Date  . AV FISTULA PLACEMENT Right 09/13/2018   Procedure: INSERTION OF ARTERIOVENOUS (AV) GORE-TEX GRAFT ARM;  Surgeon: Katha Cabal, MD;  Location: [facility] ORS;  Service: Vascular;  Laterality: Right;  . CAPD INSERTION N/A 06/08/2017   Procedure: LAPAROSCOPIC INSERTION CONTINUOUS AMBULATORY PERITONEAL DIALYSIS  (CAPD) CATHETER;  Surgeon: Katha Cabal, MD;  Location: [facility] ORS;  Service: Vascular;  Laterality: N/A;  . CATARACT EXTRACTION, BILATERAL Bilateral   . DIALYSIS/PERMA CATHETER INSERTION N/A 03/18/2017   Procedure: DIALYSIS/PERMA CATHETER INSERTION;  Surgeon: Katha Cabal, MD;  Location: [place] CV LAB;  Service: Cardiovascular;  Laterality: N/A;  . DIALYSIS/PERMA CATHETER REMOVAL N/A 10/20/2017   Procedure: DIALYSIS/PERMA CATHETER REMOVAL;  Surgeon: Algernon Huxley, MD;  Location: [place] [facility];  Service: Cardiovascular;  Laterality: N/A;  . EYE SURGERY      Allergies Patient has no known allergies.  Social History Social History   Tobacco Use  . Smoking status: Never Smoker  . Smokeless tobacco: Never Used  Substance Use Topics  . Alcohol use: No  . Drug use: No   Review of Systems Constitutional: Negative for fever. Cardiovascular: Negative for chest pain. Respiratory: Negative for shortness of breath. Gastrointestinal: Negative for abdominal pain, vomiting and diarrhea. Musculoskeletal: Negative for back pain. Skin: Negative for rash. Neurological: Positive for generalized weakness  All systems negative/normal/unremarkable except as stated in the HPI  ____________________________________________   PHYSICAL EXAM:  VITAL SIGNS: ED Triage Vitals  Enc Vitals Group     BP  Pulse      Resp      Temp      Temp src      SpO2      Weight      Height      Head Circumference      Peak Flow      Pain Score      Pain Loc      Pain Edu?      Excl. in [place]?    Constitutional: Alert and oriented. Well  appearing and in no distress. Eyes: Conjunctivae are normal. Normal extraocular movements. ENT      Head: Normocephalic and atraumatic.      Nose: No congestion/rhinnorhea.      Mouth/Throat: Mucous membranes are moist.      Neck: No stridor. Cardiovascular: Normal rate, regular rhythm. No murmurs, rubs, or gallops. Respiratory: Normal respiratory effort without tachypnea nor retractions. Breath sounds are clear and equal bilaterally. No wheezes/rales/rhonchi. Gastrointestinal: Soft and nontender. Normal bowel sounds Musculoskeletal: Nontender with normal range of motion in extremities.  Chronic appearing bilateral edema is noted Neurologic:  Normal speech and language. No gross focal neurologic deficits are appreciated.  Skin:  Skin is warm, dry and intact. No rash noted. Psychiatric: Mood and affect are normal. Speech and behavior are normal.  ____________________________________________  EKG: Interpreted by me.  Sinus rhythm with a rate of 55 bpm, prolonged PR interval, left axis deviation, wide QRS, long QT  ____________________________________________  ED COURSE:  As part of my medical decision making, I reviewed the following data within the [place] History obtained from family if available, nursing notes, old chart and ekg, as well as notes from prior ED visits. Patient presented for weakness, we will assess with labs and imaging as indicated at this time.   Procedures  Stacy Pham was evaluated in Emergency Department on 01/18/2019 for the symptoms described in the history of present illness. She was evaluated in the context of the global COVID-19 pandemic, which necessitated consideration that the patient might be at risk for infection with the SARS-CoV-2 virus that causes COVID-19. Institutional protocols and algorithms that pertain to the evaluation of patients at risk for COVID-19 are in a state of rapid change based on information released by regulatory  bodies including the CDC and federal and state organizations. These policies and algorithms were followed during the patient's care in the ED.  ____________________________________________   LABS (pertinent positives/negatives)  Labs Reviewed  CBC WITH DIFFERENTIAL/PLATELET - Abnormal; Notable for the following components:      Result Value   RBC 2.70 (*)    Hemoglobin 8.9 (*)    HCT 28.5 (*)    MCV 105.6 (*)    All other components within normal limits  COMPREHENSIVE METABOLIC PANEL - Abnormal; Notable for the following components:   Potassium 7.3 (*)    Chloride 96 (*)    CO2 21 (*)    Glucose, Bld 124 (*)    BUN 70 (*)    Creatinine, Ser 18.30 (*)    Calcium 6.8 (*)    GFR calc non Af Amer 2 (*)    GFR calc Af Amer 2 (*)    Anion gap 20 (*)    All other components within normal limits  GLUCOSE, CAPILLARY - Abnormal; Notable for the following components:   Glucose-Capillary 113 (*)    All other components within normal limits  CBG MONITORING, ED   CRITICAL CARE Performed by: Laurence Aly  Total critical care time: 30 minutes  Critical care time was exclusive of separately billable procedures and treating other patients.  Critical care was necessary to treat or prevent imminent or life-threatening deterioration.  Critical care was time spent personally by me on the following activities: development of treatment plan with patient and/or surrogate as well as nursing, discussions with consultants, evaluation of patient's response to treatment, examination of patient, obtaining history from patient or surrogate, ordering and performing treatments and interventions, ordering and review of laboratory studies, ordering and review of radiographic studies, pulse oximetry and re-evaluation of patient's condition.  RADIOLOGY  Chest x-ray IMPRESSION: Pulmonary vascular congestion with suspected degree of volume overload. No consolidation or pleural effusion.  No  adenopathy.  Aortic Atherosclerosis (ICD10-I70.0). ____________________________________________   DIFFERENTIAL DIAGNOSIS   Dehydration, electrolyte abnormality, end-stage renal disease, occult infection  FINAL ASSESSMENT AND PLAN  Weakness, end-stage renal disease, severe hyperkalemia   Plan: The patient had presented for worsening weakness. Patient's labs did reveal severe hyperkalemia with a potassium of 7.3. Patient's imaging revealed some pulmonary vascular congestion with some degree of volume overload.  Initially she was given sodium bicarb and calcium based on her peak T waves on her EKG.  We then waited for labs which confirmed hyperkalemia.  I gave her additional calcium and bicarb for same.  I have arranged for insulin and D50 as well.  I will discuss with nephrology for emergent dialysis.   Laurence Aly, MD    Note: This note was generated in part or whole with voice recognition software. Voice recognition is usually quite accurate but there are transcription errors that can and very often do occur. I apologize for any typographical errors that were not detected and corrected.     Earleen Newport, MD 01/18/19 1149

## 2019-01-18 NOTE — Progress Notes (Signed)
HD TX started    01/18/19 1245  Vital Signs  Pulse Rate Source Monitor  BP (!) 155/68  BP Location Left Arm  BP Method Automatic  Patient Position (if appropriate) Sitting  Oxygen Therapy  O2 Device Room Air  Pulse Oximetry Type Continuous  During Hemodialysis Assessment  Blood Flow Rate (mL/min) 400 mL/min  Arterial Pressure (mmHg) -250 mmHg  Venous Pressure (mmHg) 240 mmHg  Transmembrane Pressure (mmHg) 70 mmHg  Ultrafiltration Rate (mL/min) 1000 mL/min  Dialysate Flow Rate (mL/min) 800 ml/min  Conductivity: Machine  14  HD Safety Checks Performed Yes  Dialysis Fluid Bolus Normal Saline  Bolus Amount (mL) 250 mL  Intra-Hemodialysis Comments Tx initiated  Fistula / Graft Right Upper arm Arteriovenous vein graft  No Placement Date or Time found.   Placed prior to admission: Yes  Orientation: Right  Access Location: Upper arm  Access Type: Arteriovenous vein graft  Status Accessed  Needle Size 15 g  Drainage Description None

## 2019-01-18 NOTE — ED Notes (Signed)
Report given to dialysis nurse.

## 2019-01-18 NOTE — ED Notes (Signed)
Pt going to dialysis - insulin, dextrose and bicarb cancelled and calcium stopped

## 2019-01-18 NOTE — Progress Notes (Signed)
Pre HD Assessment    01/18/19 1240  Neurological  Level of Consciousness Alert  Orientation Level Oriented X4  Respiratory  Respiratory Pattern Regular;Unlabored  Chest Assessment Chest expansion symmetrical  Bilateral Breath Sounds Diminished;Fine crackles  Cough None  Cardiac  Pulse Regular  Heart Sounds S1, S2  ECG Monitor Yes  Cardiac Rhythm SB  Vascular  R Radial Pulse +2  L Radial Pulse +2  Edema Generalized  Psychosocial  Psychosocial (WDL) WDL

## 2019-01-18 NOTE — ED Triage Notes (Signed)
Patient from home via [facility]. Patient reports worsening weakness in bilateral legs, worse in left leg, since Tuesday. Patient reports she was getting in the car to go to dialysis when her leg gave out and she fell. Patient denies injury from fall. Patient also reports numbness in left leg that has been ongoing "for a long time". Patient is T, TH, S dialysis. Last treatment was last Thursday, 7/2. Patient alert and oriented upon arrival.

## 2019-01-18 NOTE — Progress Notes (Signed)
Pre Hd TX    01/18/19 1237  Hand-Off documentation  Report given to (Full Name) Beatris Ship, RN   Report received from (Full Name) Mauricia Area, RN   Vital Signs  Temp 98.4 F (36.9 C)  Temp Source Oral  Pulse Rate (!) 54  Pulse Rate Source Monitor  Resp 18  BP (!) 153/66  BP Location Left Arm  BP Method Automatic  Patient Position (if appropriate) Sitting  Oxygen Therapy  SpO2 98 %  O2 Device Room Air  Pulse Oximetry Type Continuous  Pain Assessment  Pain Scale 0-10  Pain Score 0  Dialysis Weight  Weight 86.2 kg  Type of Weight Pre-Dialysis  Time-Out for Hemodialysis  What Procedure? HD  Pt Identifiers(min of two) First/Last Name;MRN/Account#  Correct Site? Yes  Correct Side? Yes  Correct Procedure? Yes  Consents Verified? Yes  Rad Studies Available? N/A  Engineer, civil (consulting) Number 3  Station Number  (Bedside ICU 12)  UF/Alarm Test Passed  Conductivity: Meter 14  Conductivity: Machine  14  pH 7.4  Reverse Osmosis WRO3  Normal Saline Lot Number [id number]  Dialyzer Lot Number [id number]  Disposable Set Lot Number [id number]  Machine Temperature 98.6 F (37 C)  Musician and Audible Yes  Blood Lines Intact and Secured Yes  Pre Treatment Patient Checks  Vascular access used during treatment Graft  Patient is receiving dialysis in a chair Yes  Hepatitis B Surface Antigen Results Negative  Date Hepatitis B Surface Antigen Drawn  (11/15/18)  Hepatitis B Surface Antibody 11  Date Hepatitis B Surface Antibody Drawn 11/15/18  Hemodialysis Consent Verified Yes  Hemodialysis Standing Orders Initiated Yes  ECG (Telemetry) Monitor On Yes  Prime Ordered Normal Saline  Length of  DialysisTreatment -hour(s) 3 Hour(s)  Dialysis Treatment Comments Na 140  Dialyzer Elisio 17H NR  Dialysate 2K;2.5 Ca  Dialysate Flow Ordered 800 (Verbal Order change)  Blood Flow Rate Ordered 400 mL/min  Ultrafiltration Goal 3 Liters  Education / Care Plan  Dialysis Education  Provided Yes  Documented Education in Care Plan Yes  Fistula / Graft Right Upper arm Arteriovenous vein graft  No Placement Date or Time found.   Placed prior to admission: Yes  Orientation: Right  Access Location: Upper arm  Access Type: Arteriovenous vein graft  Site Condition No complications  Fistula / Graft Assessment Present;Thrill;Bruit  Status Patent  Drainage Description None

## 2019-01-18 NOTE — ED Notes (Signed)
ED TO INPATIENT HANDOFF REPORT  ED Nurse Name and Phone #: Dick Hark [phone]  S Name/Age/Gender Stacy Pham 86 y.o. female Room/Bed: ED06A/ED06A  Code Status   Code Status: Prior  Home/SNF/Other Home Patient oriented to: self, place, time and situation Is this baseline? Yes   Triage Complete: Triage complete  Chief Complaint weakness;hypertension  Triage Note Patient from home via [facility]. Patient reports worsening weakness in bilateral legs, worse in left leg, since Tuesday. Patient reports she was getting in the car to go to dialysis when her leg gave out and she fell. Patient denies injury from fall. Patient also reports numbness in left leg that has been ongoing "for a long time". Patient is T, TH, S dialysis. Last treatment was last Thursday, 7/2. Patient alert and oriented upon arrival.    Allergies No Known Allergies  Level of Care/Admitting Diagnosis ED Disposition    ED Disposition Condition [place]: [place] [[id number]]  Level of Care: Telemetry [5]  Covid Evaluation: Person Under Investigation (PUI)  Diagnosis: Hyperkalemia [[id number]]  Admitting Physician: Lang Snow [phone]  Attending Physician: Rufina Falco ACHIENG [[id number]]  PT Class (Do Not Modify): Observation [104]  PT Acc Code (Do Not Modify): Observation [10022]       B Medical/Surgery History Past Medical History:  Diagnosis Date  . (HFpEF) heart failure with preserved ejection fraction ([place])    a. 10/2017 Echo: EF 60-65%, Gr1 DD, mild MR, mildly dil LA/RA. PASP 47mmHg.  . Arthritis   . Back pain   . Bronchitis   . Cataract   . Diabetes mellitus without complication ([place])   . Dialysis patient ([place])   . ESRD (end stage renal disease) ([place])    a. 2018 - initially on HD but then transitioned to nightly PD in 08/2017.  [name] Gout   . Hypertension   . PAF (paroxysmal atrial fibrillation) ([facility])    a. CHA2DS2VASc = 5-->eliquis 2.5 BID.    Past Surgical History:  Procedure Laterality Date  . AV FISTULA PLACEMENT Right 09/13/2018   Procedure: INSERTION OF ARTERIOVENOUS (AV) GORE-TEX GRAFT ARM;  Surgeon: Katha Cabal, MD;  Location: [facility] ORS;  Service: Vascular;  Laterality: Right;  . CAPD INSERTION N/A 06/08/2017   Procedure: LAPAROSCOPIC INSERTION CONTINUOUS AMBULATORY PERITONEAL DIALYSIS  (CAPD) CATHETER;  Surgeon: Katha Cabal, MD;  Location: [facility] ORS;  Service: Vascular;  Laterality: N/A;  . CATARACT EXTRACTION, BILATERAL Bilateral   . DIALYSIS/PERMA CATHETER INSERTION N/A 03/18/2017   Procedure: DIALYSIS/PERMA CATHETER INSERTION;  Surgeon: Katha Cabal, MD;  Location: [place] CV LAB;  Service: Cardiovascular;  Laterality: N/A;  . DIALYSIS/PERMA CATHETER REMOVAL N/A 10/20/2017   Procedure: DIALYSIS/PERMA CATHETER REMOVAL;  Surgeon: Algernon Huxley, MD;  Location: [place] CV LAB;  Service: Cardiovascular;  Laterality: N/A;  . EYE SURGERY       A IV Location/Drains/Wounds Patient Lines/Drains/Airways Status   Active Line/Drains/Airways    Name:   Placement date:   Placement time:   Site:   Days:   Peripheral IV 01/18/19 Left Hand   01/18/19    1113    Hand   less than 1   Peripheral IV 01/18/19 Left Antecubital   01/18/19    1113    Antecubital   less than 1   Airway   09/13/18    1507     127   Incision (Closed) 09/13/18 Arm Right   09/13/18    1637  127          Intake/Output Last 24 hours No intake or output data in the 24 hours ending 01/18/19 1234  Labs/Imaging Results for orders placed or performed during the hospital encounter of 01/18/19 (from the past 48 hour(s))  CBC with Differential     Status: Abnormal   Collection Time: 01/18/19 10:45 AM  Result Value Ref Range   WBC 8.1 4.0 - 10.5 K/uL   RBC 2.70 (L) 3.87 - 5.11 MIL/uL   Hemoglobin 8.9 (L) 12.0 - 15.0 g/dL   HCT 28.5 (L) 36.0 - 46.0 %   MCV 105.6 (H) 80.0 - 100.0 fL   MCH 33.0 26.0 - 34.0 pg   MCHC 31.2 30.0 - 36.0  g/dL   RDW 13.8 11.5 - 15.5 %   Platelets 360 150 - 400 K/uL   nRBC 0.0 0.0 - 0.2 %   Neutrophils Relative % 69 %   Neutro Abs 5.5 1.7 - 7.7 K/uL   Lymphocytes Relative 21 %   Lymphs Abs 1.7 0.7 - 4.0 K/uL   Monocytes Relative 8 %   Monocytes Absolute 0.6 0.1 - 1.0 K/uL   Eosinophils Relative 1 %   Eosinophils Absolute 0.1 0.0 - 0.5 K/uL   Basophils Relative 1 %   Basophils Absolute 0.1 0.0 - 0.1 K/uL   Immature Granulocytes 0 %   Abs Immature Granulocytes 0.03 0.00 - 0.07 K/uL    Comment: Performed at [facility], [address] [place], [place] [address]  Comprehensive metabolic panel     Status: Abnormal   Collection Time: 01/18/19 10:45 AM  Result Value Ref Range   Sodium 137 135 - 145 mmol/L   Potassium 7.3 (HH) 3.5 - 5.1 mmol/L    Comment: CRITICAL RESULT CALLED TO, READ BACK BY AND VERIFIED WITH STEPHANIE WOOD RN AT 1145 ON 01/18/2019 SNG RESULTS CONFIRMED BY MANUAL DILUTION SNG CORRECTED ON 07/09 AT 1154: PREVIOUSLY REPORTED AS 7.3 CRITICAL RESULT CALLED TO, READ BACK BY AND VERIFIED WITH STEPHANIE WOOD RN AT 1145 ON 01/18/2019 SNG    Chloride 96 (L) 98 - 111 mmol/L   CO2 21 (L) 22 - 32 mmol/L   Glucose, Bld 124 (H) 70 - 99 mg/dL   BUN 70 (H) 8 - 23 mg/dL   Creatinine, Ser 18.30 (H) 0.44 - 1.00 mg/dL   Calcium 6.8 (L) 8.9 - 10.3 mg/dL   Total Protein 7.6 6.5 - 8.1 g/dL   Albumin 3.8 3.5 - 5.0 g/dL   AST 21 15 - 41 U/L   ALT 18 0 - 44 U/L   Alkaline Phosphatase 106 38 - 126 U/L   Total Bilirubin 0.9 0.3 - 1.2 mg/dL   GFR calc non Af Amer 2 (L) >60 mL/min   GFR calc Af Amer 2 (L) >60 mL/min   Anion gap 20 (H) 5 - 15    Comment: Performed at [facility], [address] [place], [place] [address]  Uric acid     Status: Abnormal   Collection Time: 01/18/19 10:45 AM  Result Value Ref Range   Uric Acid, Serum 8.1 (H) 2.5 - 7.1 mg/dL    Comment: Performed at [facility], [address] [place], [place] [address]  Glucose, capillary      Status: Abnormal   Collection Time: 01/18/19 10:47 AM  Result Value Ref Range   Glucose-Capillary 113 (H) 70 - 99 mg/dL   Dg Chest 1 View  Result Date: 01/18/2019 CLINICAL DATA:  Lower extremity weakness.  Chronic renal failure EXAM: CHEST  1 VIEW COMPARISON:  March 18, 2017 FINDINGS: There is cardiomegaly with mild pulmonary venous hypertension. There is mild pulmonary edema without airspace consolidation or volume loss. No appreciable pleural effusions. No adenopathy. There is aortic atherosclerosis. No bone lesions. IMPRESSION: Pulmonary vascular congestion with suspected degree of volume overload. No consolidation or pleural effusion. No adenopathy.  Aortic Atherosclerosis (ICD10-I70.0). Electronically Signed   By: Lowella Grip III M.D.   On: 01/18/2019 10:53    Pending Labs Unresulted Labs (From admission, onward)    Start     Ordered   01/18/19 1205  SARS Coronavirus 2 [facility] order, Performed in [facility] hospital lab)  (Novel Coronavirus, N[facility] Order))  ONCE - STAT,   STAT     01/18/19 1204   Signed and Held  Potassium  Now then every 4 hours,   STAT    Comments: Until normal twice.    Signed and Held   Signed and Held  Na and K (sodium & potassium), rand urine  ONCE - STAT,   STAT     Signed and Held   Signed and Held  Sodium, urine, random  ONCE - STAT,   STAT     Signed and Held   Signed and Held  Creatinine, urine, random  ONCE - STAT,   STAT     Signed and Held   Signed and Held  Osmolality, urine  ONCE - STAT,   STAT     Signed and Held          Vitals/Pain Today's Vitals   01/18/19 1030  BP: (!) 193/74  Pulse: (!) 52  Resp: 18  Temp: 98.6 F (37 C)  TempSrc: Oral  SpO2: 97%  Weight: 86.2 kg  Height: 5\' 3"  (1.6 m)  PainSc: 0-No pain    Isolation Precautions Airborne and Contact precautions  Medications Medications  calcium gluconate 1 g/ 50 mL sodium chloride IVPB (1,000 mg Intravenous New Bag/Given 01/18/19 1226)  sodium bicarbonate  injection 50 mEq (has no administration in time range)  insulin aspart (novoLOG) injection 10 Units (has no administration in time range)  dextrose 50 % solution 25 g (has no administration in time range)  calcium gluconate 1 g/ 50 mL sodium chloride IVPB (has no administration in time range)  colchicine tablet 0.6 mg (has no administration in time range)  predniSONE (DELTASONE) tablet 20 mg (has no administration in time range)  sodium bicarbonate injection 50 mEq (50 mEq Intravenous Given 01/18/19 1134)    Mobility walks with device Moderate fall risk   Focused Assessments renal   R Recommendations: See Admitting Provider Note  Report given to:   Additional Notes:

## 2019-01-18 NOTE — Progress Notes (Signed)
PT Cancellation Note  Patient Details Name: Stacy Pham MRN: [id number] DOB: 09-Sep-1930   Cancelled Treatment:    Reason Eval/Treat Not Completed: Patient not medically ready PT's K+ critically high, will hold PT today and allow dialysis, etc to bring this down to appropriate levels.    Kreg Shropshire, DPT 01/18/2019, 1:09 PM

## 2019-01-18 NOTE — Progress Notes (Signed)
Post HD Tx    01/18/19 1645  Hand-Off documentation  Report given to (Full Name) Lennart Pall, RN   Report received from (Full Name) Beatris Ship, RN   Vital Signs  Temp 98.5 F (36.9 C)  Temp Source Oral  Pulse Rate 60  Pulse Rate Source Monitor  Resp 18  BP (!) 165/50  BP Location Left Arm  BP Method Automatic  Patient Position (if appropriate) Sitting  Pain Assessment  Pain Scale 0-10  Pain Score 0  Post-Hemodialysis Assessment  Rinseback Volume (mL) 250 mL  KECN 69.8 V  Duration of HD Treatment -hour(s) 3.5 hour(s)  Hemodialysis Intake (mL) 500 mL  UF Total -Machine (mL) 3241 mL  Net UF (mL) 2741 mL  Tolerated HD Treatment Yes  AVG/AVF Arterial Site Held (minutes) 10 minutes  AVG/AVF Venous Site Held (minutes) 5 minutes  Fistula / Graft Right Upper arm Arteriovenous vein graft  No Placement Date or Time found.   Placed prior to admission: Yes  Orientation: Right  Access Location: Upper arm  Access Type: Arteriovenous vein graft  Site Condition No complications  Fistula / Graft Assessment Present;Thrill;Bruit  Status Deaccessed  Drainage Description None

## 2019-01-18 NOTE — Progress Notes (Signed)
HD Tx completed   01/18/19 1636  Vital Signs  Pulse Rate (!) 57  Pulse Rate Source Monitor  Resp 19  BP (!) 151/60  BP Location Left Arm  BP Method Automatic  Patient Position (if appropriate) Sitting  During Hemodialysis Assessment  HD Safety Checks Performed Yes  KECN 69.8 KECN  Dialysis Fluid Bolus Normal Saline  Bolus Amount (mL) 250 mL  Intra-Hemodialysis Comments Tx completed

## 2019-01-19 DIAGNOSIS — E875 Hyperkalemia: Secondary | ICD-10-CM | POA: Diagnosis not present

## 2019-01-19 DIAGNOSIS — I4891 Unspecified atrial fibrillation: Secondary | ICD-10-CM | POA: Diagnosis not present

## 2019-01-19 DIAGNOSIS — D631 Anemia in chronic kidney disease: Secondary | ICD-10-CM | POA: Diagnosis not present

## 2019-01-19 DIAGNOSIS — N2581 Secondary hyperparathyroidism of renal origin: Secondary | ICD-10-CM | POA: Diagnosis not present

## 2019-01-19 DIAGNOSIS — M109 Gout, unspecified: Secondary | ICD-10-CM | POA: Diagnosis not present

## 2019-01-19 DIAGNOSIS — I120 Hypertensive chronic kidney disease with stage 5 chronic kidney disease or end stage renal disease: Secondary | ICD-10-CM | POA: Diagnosis not present

## 2019-01-19 DIAGNOSIS — N186 End stage renal disease: Secondary | ICD-10-CM | POA: Diagnosis not present

## 2019-01-19 LAB — GLUCOSE, CAPILLARY
Glucose-Capillary: 94 mg/dL (ref 70–99)
Glucose-Capillary: 113 mg/dL — ABNORMAL HIGH (ref 70–99)

## 2019-01-19 LAB — POTASSIUM
Potassium: 4.7 mmol/L (ref 3.5–5.1)
Potassium: 4.9 mmol/L (ref 3.5–5.1)

## 2019-01-19 MED ORDER — PREDNISONE 20 MG PO TABS: 20.0000 mg | ORAL_TABLET | Freq: Every day | ORAL | 0 refills | Status: DC

## 2019-01-19 MED ORDER — AMIODARONE HCL 200 MG PO TABS: 200.0000 mg | ORAL_TABLET | Freq: Two times a day (BID) | ORAL | Status: DC

## 2019-01-19 NOTE — Evaluation (Signed)
Physical Therapy Evaluation Patient Details Name: Stacy Pham MRN: [id number] DOB: 1930/11/23 Today's Date: 01/19/2019   History of Present Illness  85 y.o. female with pertinent past medical history of CHF with preserved EF, PAF on anticoagulation, DM, ESRD on HD TThS, and hypertension presenting to the ED with c/o worsening weakness in bilateral legs.  Pt admitted with hypercalemia, post dialysis this is greatly improved for   Clinical Impression  Pt did well with PT exam and showed independence with bed mobility and transfers.  She displayed good confidence in standing and was able to maintain static balance w/o AD.  She did have some issues with manipulation of walker in tighter spaces, hitting obstacles on 2 separate occasions, gait training cues for walker use and cadence as well as cuing for appropriate walker manipulation.  She reports being near her baseline and confident about being able to manage in the home.  Vitals remained stable t/o the eval and gait training.    Follow Up Recommendations No PT follow up    Equipment Recommendations  None recommended by PT    Recommendations for Other Services       Precautions / Restrictions Precautions Precautions: Fall Restrictions Weight Bearing Restrictions: No      Mobility  Bed Mobility Overal bed mobility: Independent                Transfers Overall transfer level: Independent Equipment used: Rolling walker (2 wheeled)             General transfer comment: Pt with only minimal hesitancy with getting to EOB, showed good balance and stability  Ambulation/Gait Ambulation/Gait assistance: Modified independent (Device/Increase time) Gait Distance (Feet): 150 Feet Assistive device: Rolling walker (2 wheeled)       General Gait Details: Pt able to ambulate with consistent cadence and speed, did have some issues lacking awareness of walker and ran into obstacles X 2 with no LOBs or significant safety issues.   Pt's O2 remained in the mid 90s t/o the effort.  Stairs Stairs: Yes Stairs assistance: Supervision Stair Management: One rail Right Number of Stairs: 8 General stair comments: Pt was able to negotiate up/down steps w/ only CGA   Wheelchair Mobility    Modified Rankin (Stroke Patients Only)       Balance Overall balance assessment: Modified Independent                                           Pertinent Vitals/Pain Pain Assessment: No/denies pain    Home Living Family/patient expects to be discharged to:: Private residence Living Arrangements: Children;Other relatives Available Help at Discharge: Family;Available 24 hours/day   Home Access: Level entry     Home Layout: Multi-level Home Equipment: Walker - 2 wheels;Cane - single point      Prior Function Level of Independence: Independent               Hand Dominance        Extremity/Trunk Assessment   Upper Extremity Assessment Upper Extremity Assessment: Overall WFL for tasks assessed    Lower Extremity Assessment Lower Extremity Assessment: Overall WFL for tasks assessed       Communication   Communication: No difficulties  Cognition Arousal/Alertness: Awake/alert Behavior During Therapy: WFL for tasks assessed/performed Overall Cognitive Status: Within Functional Limits for tasks assessed  General Comments      Exercises     Assessment/Plan    PT Assessment Patent does not need any further PT services  PT Problem List         PT Treatment Interventions      PT Goals (Current goals can be found in the Care Plan section)  Acute Rehab PT Goals Patient Stated Goal: go home PT Goal Formulation: All assessment and education complete, DC therapy    Frequency     Barriers to discharge        Co-evaluation               AM-PAC PT "6 Clicks" Mobility  Outcome Measure Help needed turning from your back  to your side while in a flat bed without using bedrails?: None Help needed moving from lying on your back to sitting on the side of a flat bed without using bedrails?: None Help needed moving to and from a bed to a chair (including a wheelchair)?: None Help needed standing up from a chair using your arms (e.g., wheelchair or bedside chair)?: None Help needed to walk in hospital room?: A Little Help needed climbing 3-5 steps with a railing? : A Little 6 Click Score: 22    End of Session Equipment Utilized During Treatment: Gait belt Activity Tolerance: Patient tolerated treatment well Patient left: with chair alarm set;with call bell/phone within reach Nurse Communication: Mobility status PT Visit Diagnosis: Difficulty in walking, not elsewhere classified (R26.2);Muscle weakness (generalized) (M62.81)    Time: 3500-9381 PT Time Calculation (min) (ACUTE ONLY): 24 min   Charges:   PT Evaluation $PT Eval Low Complexity: 1 Low PT Treatments $Gait Training: 8-22 mins        Kreg Shropshire, DPT 01/19/2019, 10:33 AM

## 2019-01-19 NOTE — Progress Notes (Signed)
[facility]  ROUNDING NOTE   Subjective:   Emergent hemodialysis treatment yesterday due to volume overload and hyperkalemia.   Today patient is seated in chair. No complaints. Worked with PT   Objective:  Vital signs in last 24 hours:  Temp:  [97.9 F (36.6 C)-99.1 F (37.3 C)] 98.5 F (36.9 C) (07/10 0722) Pulse Rate:  [53-65] 59 (07/10 0722) Resp:  [13-20] 16 (07/10 0722) BP: (75-166)/(44-99) 150/75 (07/10 0722) SpO2:  [95 %-99 %] 95 % (07/10 0722) Weight:  [85 kg] 85 kg (07/09 2200)  Weight change:  Filed Weights   01/18/19 1030 01/18/19 1237 01/18/19 2200  Weight: 86.2 kg 86.2 kg 85 kg    Intake/Output: I/O last 3 completed shifts: In: -  Out: 2741 [Other:2741]   Intake/Output this shift:  No intake/output data recorded.  Physical Exam: General: NAD,   Head: Normocephalic, atraumatic. Moist oral mucosal membranes  Eyes: Anicteric, PERRL  Neck: Supple, trachea midline  Lungs:  Clear to auscultation  Heart: Regular rate and rhythm  Abdomen:  Soft, nontender,   Extremities:  + peripheral edema.  Neurologic: Nonfocal, moving all four extremities  Skin: No lesions  Access: Right AVG, PD catheter    Basic Metabolic Panel: Recent Labs  Lab 01/18/19 1045 01/18/19 1427 01/18/19 1910 01/18/19 2223 01/19/19 0154 01/19/19 0553  NA 137  --   --   --   --   --   K 7.3* 3.9 4.3 4.6 4.7 4.9  CL 96*  --   --   --   --   --   CO2 21*  --   --   --   --   --   GLUCOSE 124*  --   --   --   --   --   BUN 70*  --   --   --   --   --   CREATININE 18.30*  --   --   --   --   --   CALCIUM 6.8*  --   --   --   --   --     Liver Function Tests: Recent Labs  Lab 01/18/19 1045  AST 21  ALT 18  ALKPHOS 106  BILITOT 0.9  PROT 7.6  ALBUMIN 3.8   No results for input(s): LIPASE, AMYLASE in the last 168 hours. No results for input(s): AMMONIA in the last 168 hours.  CBC: Recent Labs  Lab 01/18/19 1045  WBC 8.1  NEUTROABS 5.5  HGB 8.9*  HCT  28.5*  MCV 105.6*  PLT 360    Cardiac Enzymes: No results for input(s): CKTOTAL, CKMB, CKMBINDEX, TROPONINI in the last 168 hours.  BNP: Invalid input(s): POCBNP  CBG: Recent Labs  Lab 01/18/19 1825 01/18/19 2109 01/18/19 2155 01/19/19 0724 01/19/19 1111  GLUCAP 124* 78 98 94 113*    Microbiology: Results for orders placed or performed during the hospital encounter of 01/18/19  SARS Coronavirus 2 [facility] order, Performed in [place] hospital lab)     Status: None   Collection Time: 01/18/19  4:39 PM   Specimen: Nasopharyngeal Swab  Result Value Ref Range Status   SARS Coronavirus 2 NEGATIVE NEGATIVE Final    Comment: (NOTE) If result is NEGATIVE SARS-CoV-2 target nucleic acids are NOT DETECTED. The SARS-CoV-2 RNA is generally detectable in upper and lower  respiratory specimens during the acute phase of infection. The lowest  concentration of SARS-CoV-2 viral copies this assay can detect is 250  copies /  mL. A negative result does not preclude SARS-CoV-2 infection  and should not be used as the sole basis for treatment or other  patient management decisions.  A negative result may occur with  improper specimen collection / handling, submission of specimen other  than nasopharyngeal swab, presence of viral mutation(s) within the  areas targeted by this assay, and inadequate number of viral copies  (<250 copies / mL). A negative result must be combined with clinical  observations, patient history, and epidemiological information. If result is POSITIVE SARS-CoV-2 target nucleic acids are DETECTED. The SARS-CoV-2 RNA is generally detectable in upper and lower  respiratory specimens dur ing the acute phase of infection.  Positive  results are indicative of active infection with SARS-CoV-2.  Clinical  correlation with patient history and other diagnostic information is  necessary to determine patient infection status.  Positive results do  not rule out bacterial  infection or co-infection with other viruses. If result is PRESUMPTIVE POSTIVE SARS-CoV-2 nucleic acids MAY BE PRESENT.   A presumptive positive result was obtained on the submitted specimen  and confirmed on repeat testing.  While 2019 novel coronavirus  (SARS-CoV-2) nucleic acids may be present in the submitted sample  additional confirmatory testing may be necessary for epidemiological  and / or clinical management purposes  to differentiate between  SARS-CoV-2 and other Sarbecovirus currently known to infect humans.  If clinically indicated additional testing with an alternate test  methodology [id number]) is advised. The SARS-CoV-2 RNA is generally  detectable in upper and lower respiratory sp ecimens during the acute  phase of infection. The expected result is Negative. Fact Sheet for Patients:  StrictlyIdeas.no Fact Sheet for Healthcare Providers: BankingDealers.co.za This test is not yet approved or cleared by the [place] FDA and has been authorized for detection and/or diagnosis of SARS-CoV-2 by FDA under an Emergency Use Authorization (EUA).  This EUA will remain in effect (meaning this test can be used) for the duration of the COVID-19 declaration under Section 564(b)(1) of the Act, 21 U.S.C. section 360bbb-3(b)(1), unless the authorization is terminated or revoked sooner. Performed at [facility], [address] [place], [place] [address]   MRSA PCR Screening     Status: None   Collection Time: 01/18/19  6:53 PM   Specimen: Nasal Mucosa; Nasopharyngeal  Result Value Ref Range Status   MRSA by PCR NEGATIVE NEGATIVE Final    Comment:        The GeneXpert MRSA Assay (FDA approved for NASAL specimens only), is one component of a comprehensive MRSA colonization surveillance program. It is not intended to diagnose MRSA infection nor to guide or monitor treatment for MRSA infections. Performed at [facility], [address] [place],  [address]     Coagulation Studies: No results for input(s): LABPROT, INR in the last 72 hours.  Urinalysis: No results for input(s): COLORURINE, LABSPEC, PHURINE, GLUCOSEU, HGBUR, BILIRUBINUR, KETONESUR, PROTEINUR, UROBILINOGEN, NITRITE, LEUKOCYTESUR in the last 72 hours.  Invalid input(s): APPERANCEUR    Imaging: Dg Chest 1 View  Result Date: 01/18/2019 CLINICAL DATA:  Lower extremity weakness.  Chronic renal failure EXAM: CHEST  1 VIEW COMPARISON:  March 18, 2017 FINDINGS: There is cardiomegaly with mild pulmonary venous hypertension. There is mild pulmonary edema without airspace consolidation or volume loss. No appreciable pleural effusions. No adenopathy. There is aortic atherosclerosis. No bone lesions. IMPRESSION: Pulmonary vascular congestion with suspected degree of volume overload. No consolidation or pleural effusion. No adenopathy.  Aortic Atherosclerosis (ICD10-I70.0).  Electronically Signed   By: Lowella Grip III M.D.   On: 01/18/2019 10:53     Medications:    . allopurinol  100 mg Oral QHS  . amiodarone  200 mg Oral BID  . apixaban  2.5 mg Oral BID  . atorvastatin  20 mg Oral QPM  . chlorhexidine  6 application Topical U4383  . epoetin (EPOGEN/PROCRIT) injection  10,000 Units Intravenous Q T,Th,Sa-HD  . ferric citrate  210 mg Oral TID WC  . gabapentin  200 mg Oral TID  . insulin aspart  0-5 Units Subcutaneous QHS  . insulin aspart  0-9 Units Subcutaneous TID WC  . metoprolol tartrate  25 mg Oral Daily  . predniSONE  20 mg Oral Q breakfast   acetaminophen  Assessment/ Plan:  [name] is a 85 y.o. black female with end stage renal disease on hemodialysis (recently changed from peritoneal dialysis), hypertension, gout, hyperlipidemia, diabetes mellitus type II, atrial fibrillation who presents to [facility] on 01/18/2019 for Hyperkalemia [E87.5] End stage renal disease on dialysis ([facility] [place] [facility]) [N18.6, Z99.2]  [facility]  TTS [facility] left AVG 88kg   1. End Stage Renal Disease with hyperkalemia: missed two dialysis treatments.  Emergent hemodialysis yesterday for hyperkalemia.  Next treatment for tomorrow.   2. Hypertension:   Home regimen of carvedilol.   3. Anemia of chronic kidney disease: hemoglobin 8.9. Macrocytic.  - EPO with HD treatments  4. Secondary Hyperparathyroidism: labs from outpatient PTH 442, calcium 7.5, phosphorus 4.7. All at goal.  - Hold cinacalcet due to hypocalcemia - Discontinue calcitriol  - Auryxia with meals.      LOS: 1 Stacy Pham 7/10/20201:27 PM

## 2019-01-19 NOTE — Discharge Instructions (Signed)
Weakness °Weakness is a lack of strength. You may feel weak all over your body (generalized), or you may feel weak in one specific part of your body (focal). Common causes of weakness include: °· Infection and immune system disorders. °· Physical exhaustion. °· Internal bleeding or other blood loss that results in a lack of red blood cells (anemia). °· Dehydration. °· An imbalance in mineral (electrolyte) levels, such as potassium. °· Heart disease, circulation problems, or stroke. °Other causes include: °· Some medicines or cancer treatment. °· Stress, anxiety, or depression. °· Nervous system disorders. °· Thyroid disorders. °· Loss of muscle strength because of age or inactivity. °· Poor sleep quality or sleep disorders. °The cause of your weakness may not be known. Some causes of weakness can be serious, so it is important to see your health care provider. °Follow these instructions at home: °Activity °· Rest as needed. °· Try to get enough sleep. Most adults need 7-8 hours of quality sleep each night. Talk to your health care provider about how much sleep you need each night. °· Do exercises, such as arm curls and leg raises, for 30 minutes at least 2 days a week or as told by your health care provider. This helps build muscle strength. °· Consider working with a physical therapist or trainer who can develop an exercise plan to help you gain muscle strength. °General instructions ° °· Take over-the-counter and prescription medicines only as told by your health care provider. °· Eat a healthy, well-balanced diet. This includes: °? Proteins to build muscles, such as lean meats and fish. °? Fresh fruits and vegetables. °? Carbohydrates to boost energy, such as whole grains. °· Drink enough fluid to keep your urine pale yellow. °· Keep all follow-up visits as told by your health care provider. This is important. °Contact a health care provider if your weakness: °· Does not improve or gets worse. °· Affects your  ability to think clearly. °· Affects your ability to do your normal daily activities. °Get help right away if you: °· Develop sudden weakness, especially on one side of your face or body. °· Have chest pain. °· Have trouble breathing or shortness of breath. °· Have problems with your vision. °· Have trouble talking or swallowing. °· Have trouble standing or walking. °· Are light-headed or lose consciousness. °Summary °· Weakness is a lack of strength. You may feel weak all over your body or just in one specific part of your body. °· Weakness can be caused by a variety of things. In some cases, the cause may be unknown. °· Rest as needed, and try to get enough sleep. Most adults need 7-8 hours of quality sleep each night. °· Eat a healthy, well-balanced diet. °This information is not intended to replace advice given to you by your health care provider. Make sure you discuss any questions you have with your health care provider. °Document Released: 06/28/2005 Document Revised: 02/01/2018 Document Reviewed: 02/01/2018 °Elsevier Patient Education © 2020 [employer] ° °

## 2019-01-19 NOTE — Discharge Summary (Signed)
[facility] [place] NAME: Stacy Pham    MR#:  [id number]  DATE OF BIRTH:  1930/10/21  DATE OF ADMISSION:  01/18/2019 ADMITTING PHYSICIAN: Lang Snow, NP  DATE OF DISCHARGE: 01/19/2019  PRIMARY CARE PHYSICIAN: Elby Beck, FNP    ADMISSION DIAGNOSIS:  Hyperkalemia [E87.5] End stage renal disease on dialysis ([place]) [N18.6, Z99.2]  DISCHARGE DIAGNOSIS:  Active Problems:   Hyperkalemia   SECONDARY DIAGNOSIS:   Past Medical History:  Diagnosis Date  . (HFpEF) heart failure with preserved ejection fraction ([place])    a. 10/2017 Echo: EF 60-65%, Gr1 DD, mild MR, mildly dil LA/RA. PASP 54mHg.  . Arthritis   . Back pain   . Bronchitis   . Cataract   . Diabetes mellitus without complication ([facility]   . Dialysis patient ([facility]   . ESRD (end stage renal disease) ([facility]    a. 2018 - initially on HD but then transitioned to nightly PD in 08/2017.  .Marland KitchenGout   . Hypertension   . PAF (paroxysmal atrial fibrillation) (HCC)    a. CHA2DS2VASc = 5-->eliquis 2.5 BID.    HOSPITAL COURSE:   85year old female with a history of diastolic heart failure, end-stage renal disease on hemodialysis and PAF who presented to the emergency room due to foot pain.  1.  Foot pain due to gout flare: Patient symptoms have improved.  She will be continued on allopurinol and colchicine.  She will continue prednisone for 3 more days.  2.  Hyperkalemia: Patient received calcium gluconate in the ER and underwent emergent dialysis.  Her potassium level has improved.  3.  End-stage renal disease on hemodialysis: Patient will continue with outpatient schedule which is Tuesday, Thursday and Saturday.   4.  PAF: Patient will continue amiodarone, metoprolol and Eliquis  5.  Chronic diastolic heart failure without signs of exacerbation  .  Diabetes: Continue ADA diet and outpatient regimen.   DISCHARGE CONDITIONS AND DIET:  Patient is stable for discharge  renal diabetic diet  CONSULTS OBTAINED:    DRUG ALLERGIES:  No Known Allergies  DISCHARGE MEDICATIONS:   Allergies as of 01/19/2019   No Known Allergies     Medication List    TAKE these medications   allopurinol 100 MG tablet Commonly known as: ZYLOPRIM Take 1 tablet (100 mg total) by mouth at bedtime.   amiodarone 200 MG tablet Commonly known as: PACERONE Take 1 tablet (200 mg total) by mouth 2 (two) times daily. Take 1 tablet (200 mg) by mouth two times a day for 2 weeks, then decrease to 1 tablet (200 mg) by mouth once a day.   apixaban 2.5 MG Tabs tablet Commonly known as: Eliquis Take 1 tablet (2.5 mg total) by mouth 2 (two) times daily.   atorvastatin 20 MG tablet Commonly known as: LIPITOR Take 1 tablet (20 mg total) by mouth every evening.   blood glucose meter kit and supplies Kit Please dispense either One Touch or Bayer Contour She must have one of these machines since she is on peritoneal dialysis. Use up to four times daily as directed. (FOR ICD-9 250.00, 250.01).   calcitRIOL 0.25 MCG capsule Commonly known as: ROCALTROL Take 0.25 mcg by mouth daily.   cinacalcet 30 MG tablet Commonly known as: SENSIPAR Take 1 tablet (30 mg total) by mouth daily with supper. What changed: additional instructions   colchicine 0.6 MG tablet Take 1 tablet (0.6 mg total) by mouth daily as needed (for  gout flare ups).   ferric citrate 1 GM 210 MG(Fe) tablet Commonly known as: AURYXIA Take 210 mg by mouth 3 (three) times daily with meals.   gabapentin 100 MG capsule Commonly known as: NEURONTIN Take 2 capsules (200 mg total) by mouth 3 (three) times daily.   metoprolol tartrate 25 MG tablet Commonly known as: LOPRESSOR Take 1 tablet (25 mg total) by mouth daily.   predniSONE 20 MG tablet Commonly known as: DELTASONE Take 1 tablet (20 mg total) by mouth daily with breakfast for 2 days. Start taking on: January 20, 2019   Tylenol 8 Hour Arthritis Pain 650 MG CR  tablet Generic drug: acetaminophen Take 650 mg every 8 (eight) hours as needed by mouth for pain.         Today   CHIEF COMPLAINT:  Patient is doing well no shortness of breath.  Toe pain has improved.   VITAL SIGNS:  Blood pressure (!) 150/75, pulse (!) 59, temperature 98.5 F (36.9 C), temperature source Oral, resp. rate 16, height 5' 4" (1.626 m), weight 85 kg, SpO2 95 %.   REVIEW OF SYSTEMS:  Review of Systems  Constitutional: Negative.  Negative for chills, fever and malaise/fatigue.  HENT: Negative.  Negative for ear discharge, ear pain, hearing loss, nosebleeds and sore throat.   Eyes: Negative.  Negative for blurred vision and pain.  Respiratory: Negative.  Negative for cough, hemoptysis, shortness of breath and wheezing.   Cardiovascular: Negative.  Negative for chest pain, palpitations and leg swelling.  Gastrointestinal: Negative.  Negative for abdominal pain, blood in stool, diarrhea, nausea and vomiting.  Genitourinary: Negative.  Negative for dysuria.  Musculoskeletal: Negative.  Negative for back pain.  Skin: Negative.   Neurological: Negative for dizziness, tremors, speech change, focal weakness, seizures and headaches.  Endo/Heme/Allergies: Negative.  Does not bruise/bleed easily.  Psychiatric/Behavioral: Negative.  Negative for depression, hallucinations and suicidal ideas.     PHYSICAL EXAMINATION:  GENERAL:  85 y.o.-year-old patient lying in the bed with no acute distress.  NECK:  Supple, no jugular venous distention. No thyroid enlargement, no tenderness.  LUNGS: Normal breath sounds bilaterally, no wheezing, rales,rhonchi  No use of accessory muscles of respiration.  CARDIOVASCULAR: S1, S2 normal. No murmurs, rubs, or gallops.  ABDOMEN: Soft, non-tender, non-distended. Bowel sounds present. No organomegaly or mass.  EXTREMITIES: No pedal edema, cyanosis, or clubbing.  PSYCHIATRIC: The patient is alert and oriented x 3.  SKIN: No obvious rash,  lesion, or ulcer.   DATA REVIEW:   CBC Recent Labs  Lab 01/18/19 1045  WBC 8.1  HGB 8.9*  HCT 28.5*  PLT 360    Chemistries  Recent Labs  Lab 01/18/19 1045  01/19/19 0553  NA 137  --   --   K 7.3*   < > 4.9  CL 96*  --   --   CO2 21*  --   --   GLUCOSE 124*  --   --   BUN 70*  --   --   CREATININE 18.30*  --   --   CALCIUM 6.8*  --   --   AST 21  --   --   ALT 18  --   --   ALKPHOS 106  --   --   BILITOT 0.9  --   --    < > = values in this interval not displayed.    Cardiac Enzymes No results for input(s): TROPONINI in the last 168 hours.  Microbiology Results  @  [id number]@  RADIOLOGY:  Dg Chest 1 View  Result Date: 01/18/2019 CLINICAL DATA:  Lower extremity weakness.  Chronic renal failure EXAM: CHEST  1 VIEW COMPARISON:  March 18, 2017 FINDINGS: There is cardiomegaly with mild pulmonary venous hypertension. There is mild pulmonary edema without airspace consolidation or volume loss. No appreciable pleural effusions. No adenopathy. There is aortic atherosclerosis. No bone lesions. IMPRESSION: Pulmonary vascular congestion with suspected degree of volume overload. No consolidation or pleural effusion. No adenopathy.  Aortic Atherosclerosis (ICD10-I70.0). Electronically Signed   By: Lowella Grip III M.D.   On: 01/18/2019 10:53      Allergies as of 01/19/2019   No Known Allergies     Medication List    TAKE these medications   allopurinol 100 MG tablet Commonly known as: ZYLOPRIM Take 1 tablet (100 mg total) by mouth at bedtime.   amiodarone 200 MG tablet Commonly known as: PACERONE Take 1 tablet (200 mg total) by mouth 2 (two) times daily. Take 1 tablet (200 mg) by mouth two times a day for 2 weeks, then decrease to 1 tablet (200 mg) by mouth once a day.   apixaban 2.5 MG Tabs tablet Commonly known as: Eliquis Take 1 tablet (2.5 mg total) by mouth 2 (two) times daily.   atorvastatin 20 MG tablet Commonly known as: LIPITOR Take 1 tablet (20  mg total) by mouth every evening.   blood glucose meter kit and supplies Kit Please dispense either One Touch or Bayer Contour She must have one of these machines since she is on peritoneal dialysis. Use up to four times daily as directed. (FOR ICD-9 250.00, 250.01).   calcitRIOL 0.25 MCG capsule Commonly known as: ROCALTROL Take 0.25 mcg by mouth daily.   cinacalcet 30 MG tablet Commonly known as: SENSIPAR Take 1 tablet (30 mg total) by mouth daily with supper. What changed: additional instructions   colchicine 0.6 MG tablet Take 1 tablet (0.6 mg total) by mouth daily as needed (for gout flare ups).   ferric citrate 1 GM 210 MG(Fe) tablet Commonly known as: AURYXIA Take 210 mg by mouth 3 (three) times daily with meals.   gabapentin 100 MG capsule Commonly known as: NEURONTIN Take 2 capsules (200 mg total) by mouth 3 (three) times daily.   metoprolol tartrate 25 MG tablet Commonly known as: LOPRESSOR Take 1 tablet (25 mg total) by mouth daily.   predniSONE 20 MG tablet Commonly known as: DELTASONE Take 1 tablet (20 mg total) by mouth daily with breakfast for 2 days. Start taking on: January 20, 2019   Tylenol 8 Hour Arthritis Pain 650 MG CR tablet Generic drug: acetaminophen Take 650 mg every 8 (eight) hours as needed by mouth for pain.         Management plans discussed with the patient and she is in agreement. Stable for discharge home  Patient should follow up with pcp  CODE STATUS:     Code Status Orders  (From admission, onward)         Start     Ordered   01/18/19 1352  Full code  Continuous     01/18/19 1351        Code Status History    Date Active Date Inactive Code Status Order ID Comments User Context   04/27/2017 0912 04/28/2017 1721 Full Code [id number]  [name], MD Inpatient   Advance Care Planning Activity      TOTAL TIME TAKING CARE OF THIS PATIENT: 38 minutes.  Note: This dictation was prepared with Dragon dictation along with  smaller phrase technology. Any transcriptional errors that result from this process are unintentional.  Bettey Costa M.D on 01/19/2019 at 10:19 AM  Between 7am to 6pm - Pager - [phone] After 6pm go to [email or web address] - password [email or web address] [facility]  Office  [phone]  CC: Primary care physician; Elby Beck, FNP

## 2019-01-19 NOTE — Plan of Care (Signed)
  Problem: Clinical Measurements: Goal: Ability to maintain clinical measurements within normal limits will improve Outcome: Progressing   Problem: Safety: Goal: Ability to remain free from injury will improve Outcome: Progressing   

## 2019-01-20 ENCOUNTER — Encounter: Payer: Self-pay | Admitting: Family Medicine

## 2019-01-20 DIAGNOSIS — N2581 Secondary hyperparathyroidism of renal origin: Secondary | ICD-10-CM | POA: Diagnosis not present

## 2019-01-20 DIAGNOSIS — D509 Iron deficiency anemia, unspecified: Secondary | ICD-10-CM | POA: Diagnosis not present

## 2019-01-20 DIAGNOSIS — N186 End stage renal disease: Secondary | ICD-10-CM | POA: Diagnosis not present

## 2019-01-20 DIAGNOSIS — D631 Anemia in chronic kidney disease: Secondary | ICD-10-CM | POA: Diagnosis not present

## 2019-01-20 DIAGNOSIS — Z992 Dependence on renal dialysis: Secondary | ICD-10-CM | POA: Diagnosis not present

## 2019-01-21 NOTE — Progress Notes (Signed)
Cardiology Office Note Date:  01/22/2019  Patient ID:  Stacy, Pham 11/24/1930, MRN [id number] PCP:  Elby Beck, FNP  Cardiologist:  Dr. Saunders Revel, MD    Chief Complaint: Follow up Afib  History of Present Illness: Stacy Pham is a 85 y.o. female with history of PAF on Eliquis, ESRD on HD (TTS), HFpEF, anemia of chronic disease, DM2, HTN, and gout who presents for follow up of her Afib.   Patient has been managed on Eliquis since diagnosis of A. fib in 2018.  She was previously on hemodialysis though transitioned to PD in 08/2017.  Echo from 10/2017 showed an EF of 60 to 54%, grade 1 diastolic dysfunction, mild MR, PASP 40 mmHg.  More recently, it was recommended she transition back to hemodialysis by her nephrologist.  She was scheduled to undergo AV fistula placement in 07/2018 though upon presentation she was noted to be in A. fib with RVR and hypertensive.  Case was canceled and she was seen in the PACU.  Her carvedilol was transitioned to metoprolol.  Of note, the patient was asymptomatic in this setting.  She subsequently underwent AV fistula placement on 09/13/2018.  Patient was recently admitted to [facility] in early 01/2019 for gout flare of the foot. Initial potassium was elevated at 7.3. CXR showed pulmonary vascular congestion with suspected degree of volume overload. She was treated by IM and underwent HD.  It appears that she maintains sinus rhythm during the admission.  She comes in doing well from a cardiac perspective.  No chest pain, shortness of breath, palpitations, dizziness, recently, or syncope.  No falls, BRBPR, or melena.  No lower extremity swelling, abdominal distention, orthopnea, PND, early satiety.  Her main concerns today are noncardiac and related to 1) while she was prescribed prednisone time of discharge for her gout and 2) chronic low back pain for which she takes Tylenol.  She also states her blood pressure seems to be trending on the high side during  hemodialysis.  Labs:  01/2019 - Potassium 4.9, uric acid 8.1, AST/ALT normal, albumin 3.8, HGB 8.9 08/2018 - A1c 6.7 04/2018 - LDL 111 02/2018 - TSH normal   Past Medical History:  Diagnosis Date  . (HFpEF) heart failure with preserved ejection fraction ([place])    a. 10/2017 Echo: EF 60-65%, Gr1 DD, mild MR, mildly dil LA/RA. PASP 48mHg.  . Arthritis   . Back pain   . Bronchitis   . Cataract   . Diabetes mellitus without complication ([facility]   . Dialysis patient ([facility]   . ESRD (end stage renal disease) ([facility]    a. 2018 - initially on HD but then transitioned to nightly PD in 08/2017.  .Marland KitchenGout   . Hypertension   . PAF (paroxysmal atrial fibrillation) (HCC)    a. CHA2DS2VASc = 5-->eliquis 2.5 BID.    Past Surgical History:  Procedure Laterality Date  . AV FISTULA PLACEMENT Right 09/13/2018   Procedure: INSERTION OF ARTERIOVENOUS (AV) GORE-TEX GRAFT ARM;  Surgeon: SKatha Cabal MD;  Location: [facility] ORS;  Service: Vascular;  Laterality: Right;  . CAPD INSERTION N/A 06/08/2017   Procedure: LAPAROSCOPIC INSERTION CONTINUOUS AMBULATORY PERITONEAL DIALYSIS  (CAPD) CATHETER;  Surgeon: SKatha Cabal MD;  Location: [facility] ORS;  Service: Vascular;  Laterality: N/A;  . CATARACT EXTRACTION, BILATERAL Bilateral   . DIALYSIS/PERMA CATHETER INSERTION N/A 03/18/2017   Procedure: DIALYSIS/PERMA CATHETER INSERTION;  Surgeon: SKatha Cabal MD;  [facility];  Service: Cardiovascular;  Laterality: N/A;  .  DIALYSIS/PERMA CATHETER REMOVAL N/A 10/20/2017   Procedure: DIALYSIS/PERMA CATHETER REMOVAL;  Surgeon: Algernon Huxley, MD;  Location: [facility];  Service: Cardiovascular;  Laterality: N/A;  . EYE SURGERY      Current Meds  Medication Sig  . acetaminophen (TYLENOL 8 HOUR ARTHRITIS PAIN) 650 MG CR tablet Take 650 mg every 8 (eight) hours as needed by mouth for pain.  [name] allopurinol (ZYLOPRIM) 100 MG tablet Take 1 tablet (100 mg total) by mouth at bedtime.  [name]  amiodarone (PACERONE) 200 MG tablet Take 1 tablet (200 mg total) by mouth 2 (two) times daily. Take 1 tablet (200 mg) by mouth two times a day for 2 weeks, then decrease to 1 tablet (200 mg) by mouth once a day.  [name] apixaban (ELIQUIS) 2.5 MG TABS tablet Take 1 tablet (2.5 mg total) by mouth 2 (two) times daily.  [name] atorvastatin (LIPITOR) 20 MG tablet Take 1 tablet (20 mg total) by mouth every evening.  . blood glucose meter kit and supplies KIT Please dispense either One Touch or Bayer Contour She must have one of these machines since she is on peritoneal dialysis. Use up to four times daily as directed. (FOR ICD-9 250.00, 250.01).  . calcitRIOL (ROCALTROL) 0.25 MCG capsule Take 0.25 mcg by mouth daily.  . cinacalcet (SENSIPAR) 30 MG tablet Take 1 tablet (30 mg total) by mouth daily with supper. (Patient taking differently: Take 30 mg by mouth daily with supper. With largest meal)  . colchicine 0.6 MG tablet Take 1 tablet (0.6 mg total) by mouth daily as needed (for gout flare ups).  . ferric citrate (AURYXIA) 1 GM 210 MG(Fe) tablet Take 210 mg by mouth 3 (three) times daily with meals.  . gabapentin (NEURONTIN) 100 MG capsule Take 2 capsules (200 mg total) by mouth 3 (three) times daily.  . metoprolol tartrate (LOPRESSOR) 25 MG tablet Take 1 tablet (25 mg total) by mouth daily.    Allergies:   Patient has no known allergies.   Social History:  The patient  reports that she has never smoked. She has never used smokeless tobacco. She reports that she does not drink alcohol or use drugs.   Family History:  The patient's family history includes Hypertension in her father.  ROS:   Review of Systems  Constitutional: Negative for chills, diaphoresis, fever, malaise/fatigue and weight loss.  HENT: Negative for congestion.   Eyes: Negative for discharge and redness.  Respiratory: Negative for cough, hemoptysis, sputum production, shortness of breath and wheezing.   Cardiovascular: Negative for chest  pain, palpitations, orthopnea, claudication, leg swelling and PND.  Gastrointestinal: Negative for abdominal pain, blood in stool, heartburn, melena, nausea and vomiting.  Genitourinary: Negative for hematuria.  Musculoskeletal: Positive for back pain and joint pain. Negative for falls and myalgias.  Skin: Negative for rash.  Neurological: Negative for dizziness, tingling, tremors, sensory change, speech change, focal weakness, loss of consciousness and weakness.  Endo/Heme/Allergies: Does not bruise/bleed easily.  Psychiatric/Behavioral: Negative for substance abuse. The patient is not nervous/anxious.   All other systems reviewed and are negative.    PHYSICAL EXAM:  VS:  BP (!) 150/70 (BP Location: Left Arm, Patient Position: Sitting, Cuff Size: Normal)   Pulse (!) 59   Temp (!) 97 F (36.1 C)   Ht _0  (1.6 m)   Wt 190 lb 8 oz (86.4 kg)   SpO2 97%   BMI 33.75 kg/m  BMI: Body mass index is 33.75 kg/m.  Physical Exam  Constitutional: She is oriented to person, place, and time. She appears well-developed and well-nourished.  HENT:  Head: Normocephalic and atraumatic.  Eyes: Right eye exhibits no discharge. Left eye exhibits no discharge.  Neck: Normal range of motion. No JVD present.  Cardiovascular: Regular rhythm, S1 normal, S2 normal and normal heart sounds. Bradycardia present. Exam reveals no distant heart sounds, no friction rub, no midsystolic click and no opening snap.  No murmur heard. Pulmonary/Chest: Effort normal and breath sounds normal. No respiratory distress. She has no decreased breath sounds. She has no wheezes. She has no rales. She exhibits no tenderness.  Abdominal: Soft. She exhibits no distension. There is no abdominal tenderness.  Musculoskeletal:        General: No edema.  Neurological: She is alert and oriented to person, place, and time.  Skin: Skin is warm and dry. No cyanosis. Nails show no clubbing.  Psychiatric: She has a normal mood and affect.  Her speech is normal and behavior is normal. Judgment and thought content normal.     EKG:  Was ordered and interpreted by me today. Shows sinus bradycardia, 59 bpm, no acute ST-T changes  Recent Labs: 02/21/2018: TSH 3.308 03/17/2018: Magnesium 1.6 01/18/2019: ALT 18; BUN 70; Creatinine, Ser 18.30; Hemoglobin 8.9; Platelets 360; Sodium 137 01/19/2019: Potassium 4.9  05/03/2018: Chol/HDL Ratio 3.9; Cholesterol, Total 179; HDL 46; LDL Calculated 111; Triglycerides 108   Estimated Creatinine Clearance: 2.3 mL/min (A) (by C-G formula based on SCr of 18.3 mg/dL (H)).   Wt Readings from Last 3 Encounters:  01/22/19 190 lb 8 oz (86.4 kg)  01/18/19 187 lb 4.8 oz (85 kg)  12/22/18 193 lb (87.5 kg)     Other studies reviewed: Additional studies/records reviewed today include: summarized above  ASSESSMENT AND PLAN:  1. PAF: Maintaining sinus rhythm with a mildly bradycardic heart rate.  She has been taking Lopressor 25 mg once daily, presumably in the setting of issues with relative hypotension surrounding her dialysis.  We will change her to Lopressor 12.5 mg twice daily for more consistent dosing/BP control.  Continue amiodarone 200 mg daily.  Recent LFT and TSH normal.  Schedule PFTs given recently started amiodarone.  She will need routine follow up of her LFT, thyroid function, as well as regular eye exams.  CHADS2VASc at least 6 (HTN, age x 2, DM, vascular disease, female).  Continue Eliquis 2.5 mg twice daily as she meets reduced dosing criteria with age and renal function.  2. HFpEF: She appears euvolemic and well compensated.  Volume managed by hemodialysis.  3. ESRD: HD per nephrology.   4. Hypertension: Blood pressure is mildly elevated today at 150/70, though she has issues with relative hypotension during HD.  However, she does state her blood pressure has been trending up with hemodialysis which would certainly be a new finding for her.  In this setting, we will call her dialysis center  to request recent BP readings with follow-up recommendation of antihypertensives after we have this information.  For now, we will change her Lopressor from 25 mg daily to 12.5 mg twice daily as outlined above.    5. Anemia of chronic disease: Most recent hemoglobin stable.  Follow-up with PCP as directed later this week.  6. Gout: Follow-up with PCP.  7. Chronic low back pain: Follow-up with PCP.  Disposition: F/u with Dr. Saunders Revel or an APP in 6 months.  Current medicines are reviewed at length with the patient today.  The patient did not have any concerns  regarding medicines.  Signed, Christell Faith, PA-C 01/22/2019 8:42 AM     [facility] [address] [place], [place] [address] [phone]

## 2019-01-22 ENCOUNTER — Other Ambulatory Visit: Payer: Self-pay

## 2019-01-22 ENCOUNTER — Ambulatory Visit (INDEPENDENT_AMBULATORY_CARE_PROVIDER_SITE_OTHER): Payer: Medicare Other | Admitting: Physician Assistant

## 2019-01-22 ENCOUNTER — Telehealth: Payer: Self-pay | Admitting: Physician Assistant

## 2019-01-22 ENCOUNTER — Encounter: Payer: Self-pay | Admitting: Physician Assistant

## 2019-01-22 VITALS — BP 150/70 | HR 59 | Temp 97.0°F | Ht 63.0 in | Wt 190.5 lb

## 2019-01-22 DIAGNOSIS — D638 Anemia in other chronic diseases classified elsewhere: Secondary | ICD-10-CM | POA: Diagnosis not present

## 2019-01-22 DIAGNOSIS — Z992 Dependence on renal dialysis: Secondary | ICD-10-CM | POA: Diagnosis not present

## 2019-01-22 DIAGNOSIS — M5442 Lumbago with sciatica, left side: Secondary | ICD-10-CM

## 2019-01-22 DIAGNOSIS — M5441 Lumbago with sciatica, right side: Secondary | ICD-10-CM | POA: Diagnosis not present

## 2019-01-22 DIAGNOSIS — I5032 Chronic diastolic (congestive) heart failure: Secondary | ICD-10-CM | POA: Diagnosis not present

## 2019-01-22 DIAGNOSIS — I10 Essential (primary) hypertension: Secondary | ICD-10-CM

## 2019-01-22 DIAGNOSIS — Z01812 Encounter for preprocedural laboratory examination: Secondary | ICD-10-CM | POA: Diagnosis not present

## 2019-01-22 DIAGNOSIS — G8929 Other chronic pain: Secondary | ICD-10-CM | POA: Diagnosis not present

## 2019-01-22 DIAGNOSIS — Z79899 Other long term (current) drug therapy: Secondary | ICD-10-CM

## 2019-01-22 DIAGNOSIS — N186 End stage renal disease: Secondary | ICD-10-CM

## 2019-01-22 DIAGNOSIS — I480 Paroxysmal atrial fibrillation: Secondary | ICD-10-CM

## 2019-01-22 DIAGNOSIS — M109 Gout, unspecified: Secondary | ICD-10-CM

## 2019-01-22 MED ORDER — METOPROLOL TARTRATE 25 MG PO TABS: 12.5000 mg | ORAL_TABLET | Freq: Two times a day (BID) | ORAL | Status: DC

## 2019-01-22 MED ORDER — AMLODIPINE BESYLATE 5 MG PO TABS: 5.0000 mg | ORAL_TABLET | Freq: Every day | ORAL | 2 refills | Status: DC

## 2019-01-22 NOTE — Telephone Encounter (Addendum)
Called the patient to give Christell Faith, PA recommendation.  Pt made aware of Ryan's recommendation. Rx sent to the pt pharmacy. Pt verbalized understanding and voiced appreciation for the call.

## 2019-01-22 NOTE — Telephone Encounter (Signed)
I called and spoke with the hemo nurse at [facility] in [place] to obtain BP readings per the request of Christell Faith, PA.  The patient was seen by him in clinic this morning.  Per nursing at HD-   Sat (01/20/19): Pre treatment- (sitting) 158/74 (standing) 162/75  During treatment- 135/60, 152/68, 166/68  Post treatment-  (sitting) 173/86 (standing) 191/85  Patient was in the hospital 1 week prior to her last treatment.  Weight dropped and calcium normalized. Per nursing at HD center, they dropped her target weight 1.5 kg.   Nursing states she also missed some treatments as well.   Tues (01/09/19): Pre treatment- (sitting) 210/96 (standing) 231/105  During treatment- 181/94, 177/83, 188/90  Post treatment-  (sitting) 181/85 (standing) 199/94  To Christell Faith, PA to review.

## 2019-01-22 NOTE — Patient Instructions (Addendum)
Medication Instructions:  - Your physician has recommended you make the following change in your medication:   1) Change metoprolol tartrate 25 mg- take 1/2 tablet (12.5 mg) by mouth once twice daily  If you need a refill on your cardiac medications before your next appointment, please call your pharmacy.   Lab work: - none ordered  If you have labs (blood work) drawn today and your tests are completely normal, you will receive your results only by: [name] MyChart Message (if you have MyChart) OR . A paper copy in the mail If you have any lab test that is abnormal or we need to change your treatment, we will call you to review the results.  Testing/Procedures: - Your physician has recommended that you have a pulmonary function test. Pulmonary Function Tests are a group of tests that measure how well air moves in and out of your lungs.  Wednesday 02/21/19  - Arrive at [place] at [facility] @ 4:00 pm  - see attached instruction sheet  You will need a pre-testing COVID swab prior - Friday 02/16/2019 (from 10:30 am-12:30 pm) - Drive up to the Entrance at [employer] at [facility], you do not have to leave your vehicle   Follow-Up: At [facility], you and your health needs are our priority.  As part of our continuing mission to provide you with exceptional heart care, we have created designated Provider Care Teams.  These Care Teams include your primary Cardiologist (physician) and Advanced Practice Providers (APPs -  Physician Assistants and Nurse Practitioners) who all work together to provide you with the care you need, when you need it.  You will need a follow up appointment in 6 months (January 2021). Please call our office 2 months in advance to schedule this appointment. (Call in early November 2020 to schedule)  You may see Nelva Bush, MD or one of the following Advanced Practice Providers on your designated Care Team:   Murray Hodgkins, NP Christell Faith, PA-C . Marrianne Mood,  PA-C  Any Other Special Instructions Will Be Listed Below (If Applicable). - N/A

## 2019-01-22 NOTE — Telephone Encounter (Signed)
BPs reviewed and do demonstrate significant hypertension pre-and post dialysis.  Please start the patient on amlodipine 5 mg daily.

## 2019-01-22 NOTE — Addendum Note (Signed)
Addended by: Lamar Laundry on: 01/22/2019 11:14 AM   Modules accepted: Orders

## 2019-01-22 NOTE — Telephone Encounter (Signed)
Debbie, please see message below. I responded with extract information to the patient. I was not sure if there were any other details that I missed. Thank you.

## 2019-01-23 ENCOUNTER — Telehealth: Payer: Self-pay

## 2019-01-23 DIAGNOSIS — D631 Anemia in chronic kidney disease: Secondary | ICD-10-CM | POA: Diagnosis not present

## 2019-01-23 DIAGNOSIS — N2581 Secondary hyperparathyroidism of renal origin: Secondary | ICD-10-CM | POA: Diagnosis not present

## 2019-01-23 DIAGNOSIS — N186 End stage renal disease: Secondary | ICD-10-CM | POA: Diagnosis not present

## 2019-01-23 DIAGNOSIS — D509 Iron deficiency anemia, unspecified: Secondary | ICD-10-CM | POA: Diagnosis not present

## 2019-01-23 DIAGNOSIS — Z992 Dependence on renal dialysis: Secondary | ICD-10-CM | POA: Diagnosis not present

## 2019-01-23 NOTE — Telephone Encounter (Signed)
Transition Care Management Follow-up Telephone Call  Note: I spoke with patient and her daughter.  Patient became very tired on the phone and asked to not complete the review of her meds and allergies.  All of the rest of the TCM call was completed with those exceptions due to patient and daughter request.  They were just returning from dialysis.    Date discharged?01/19/19   How have you been since you were released from the hospital? I am here, that's the best I can say.     Do you understand why you were in the hospital? Yes,    Do you understand the discharge instructions? Yes   Where were you discharged to? Home    Items Reviewed:  Medications reviewed: No at patients request due to fatigue.   Allergies reviewed: No at patients request due to fatigue.   Dietary changes reviewed: Yes, no changes  Referrals reviewed: patient is in need of a walker and states she did not qualify for home health services/PT, nursing at discharge.  May need to re-eval this need on Friday with PCP, sounds like patient could benefit at least from an equipment support perspective and nursing. Will fyi to provider.    Functional Questionnaire:   Activities of Daily Living (ADLs):   She states they are independent in the following: dressing, bathing, toileting, feeding self.  States they require assistance with the following: needs walker for ambulation   Any transportation issues/concerns?: No   Any patient concerns?No, she did mention that her prednisone was not at the pharmacy when she went to pick it up after discharge.  I informed them that it didn't appear to be sent in until yesterday, daughter is going to try again and let us know if it was not received.    Confirmed importance and date/time of follow-up visits scheduled Yes  Provider Appointment booked with  Tor Netters, NP for 01/26/19  Confirmed with patient if condition begins to worsen call PCP or go to the ER.  Patient was  given the office number and encouraged to call back with question or concerns.  : Yes.

## 2019-01-24 NOTE — Telephone Encounter (Signed)
Noted  

## 2019-01-25 ENCOUNTER — Telehealth: Payer: Self-pay

## 2019-01-25 DIAGNOSIS — Z992 Dependence on renal dialysis: Secondary | ICD-10-CM | POA: Diagnosis not present

## 2019-01-25 DIAGNOSIS — N186 End stage renal disease: Secondary | ICD-10-CM | POA: Diagnosis not present

## 2019-01-25 DIAGNOSIS — N2581 Secondary hyperparathyroidism of renal origin: Secondary | ICD-10-CM | POA: Diagnosis not present

## 2019-01-25 DIAGNOSIS — D631 Anemia in chronic kidney disease: Secondary | ICD-10-CM | POA: Diagnosis not present

## 2019-01-25 DIAGNOSIS — D509 Iron deficiency anemia, unspecified: Secondary | ICD-10-CM | POA: Diagnosis not present

## 2019-01-25 NOTE — Telephone Encounter (Signed)
[name] (DPR signed) left v/m that pt was ordered prednisone when in hospital but is not at pharmacy. Pt has HFU 01/26/19 at 12 noon with Glenda Chroman FNP.

## 2019-01-25 NOTE — Telephone Encounter (Signed)
[place] Night - Client Nonclinical Telephone Record AccessNurse Client [facility] Night - Client Client [place] Physician Tor Netters- NP Contact Type Call Who Is Calling Patient / Member / Family / Caregiver Caller Name Declined to provide Caller Phone Number [phone] Patient Name Stacy Pham Patient DOB 1930-10-25 Call Type Message Only Information Provided Reason for Call Request to Speak to a Physician Initial Comment Caller states her prescription was not at the pharmacy and she is needing this called in. ( Prednisone ) Additional Comment Call Closed By: Memory Argue Transaction Date/Time: 01/24/2019 6:20:34 PM (ET)

## 2019-01-25 NOTE — Telephone Encounter (Signed)
Please call patient and let them know that according to the hospital discharge note, the prednisone was supposed to be started on 01/20/2019, at this point, it can wait until hospital follow up tomorrow to see if it is needed.

## 2019-01-26 ENCOUNTER — Ambulatory Visit (INDEPENDENT_AMBULATORY_CARE_PROVIDER_SITE_OTHER): Payer: Medicare Other | Admitting: Family Medicine

## 2019-01-26 ENCOUNTER — Other Ambulatory Visit: Payer: Self-pay

## 2019-01-26 VITALS — BP 154/60 | HR 58 | Temp 97.7°F | Resp 20 | Ht 63.0 in

## 2019-01-26 DIAGNOSIS — E1122 Type 2 diabetes mellitus with diabetic chronic kidney disease: Secondary | ICD-10-CM

## 2019-01-26 DIAGNOSIS — M79671 Pain in right foot: Secondary | ICD-10-CM | POA: Diagnosis not present

## 2019-01-26 DIAGNOSIS — Z992 Dependence on renal dialysis: Secondary | ICD-10-CM | POA: Diagnosis not present

## 2019-01-26 DIAGNOSIS — I5032 Chronic diastolic (congestive) heart failure: Secondary | ICD-10-CM | POA: Diagnosis not present

## 2019-01-26 DIAGNOSIS — L608 Other nail disorders: Secondary | ICD-10-CM | POA: Diagnosis not present

## 2019-01-26 DIAGNOSIS — N186 End stage renal disease: Secondary | ICD-10-CM | POA: Diagnosis not present

## 2019-01-26 DIAGNOSIS — M79672 Pain in left foot: Secondary | ICD-10-CM

## 2019-01-26 DIAGNOSIS — R296 Repeated falls: Secondary | ICD-10-CM

## 2019-01-26 NOTE — Patient Instructions (Signed)
Good to see you today  I will put in a referral for physical therapy to evaluate your walker and whether a new one is indicated  I have put in a referral to podiatry to evaluate foot pain  If you are not able to get help with transportation once you are going to the new dialysis center, please let me know

## 2019-01-26 NOTE — Progress Notes (Signed)
Subjective:    Patient ID: Stacy Pham, female    DOB: 1930/09/24, 85 y.o.   MRN: [id number]  HPI This is an 85 yo female who presents today for hospital follow up. Was admitted 7/9-7/10/20 to observation with end stage renal disease with hyperkalemia and gout flare. She went to the ER after missing 2 days of dialysis and with toe pain (gout treated with prednisone and colchicine).  Today she reports continued left great toe pain and continued burning of both feet. Has been wearing sneakers, no problems wearing shoes.   Falls- fell coming into office today, was using 4 prong cane with a missing end cap. She lost her strength and started to sit down, a patient in the waiting room stopped her from falling to the floor. She denies any pain or injury. She fell last month and cut her ear requiring stitches. She denies any additional falls. She uses a walker at home but reports that it is broken and she requests a new walker.   ESRD on dialysis- she is currently getting changed to a dialysis center closer to her home. She has bene having difficulty with transportation and takes Lyft to appointments. Her daughter (with whom she lives) arranges this for her. She has difficulty getting Lyft on Saturdays. She has been worried about public transportation and catching COVID.   Left great toe pain- has had gout in the past, was treated as above. Is continuously maintained on allopurinol 100 mg daily. Uric acid level 01/18/19- 8.1.   Past Medical History:  Diagnosis Date  . (HFpEF) heart failure with preserved ejection fraction ([place])    a. 10/2017 Echo: EF 60-65%, Gr1 DD, mild MR, mildly dil LA/RA. PASP 52mmHg.  . Arthritis   . Back pain   . Bronchitis   . Cataract   . Diabetes mellitus without complication ([place])   . Dialysis patient ([place])   . ESRD (end stage renal disease) ([place])    a. 2018 - initially on HD but then transitioned to nightly PD in 08/2017.  [name] Gout   . Hypertension   . PAF (paroxysmal  atrial fibrillation) ([facility])    a. CHA2DS2VASc = 5-->eliquis 2.5 BID.   Past Surgical History:  Procedure Laterality Date  . AV FISTULA PLACEMENT Right 09/13/2018   Procedure: INSERTION OF ARTERIOVENOUS (AV) GORE-TEX GRAFT ARM;  Surgeon: Katha Cabal, MD;  Location: [facility] ORS;  Service: Vascular;  Laterality: Right;  . CAPD INSERTION N/A 06/08/2017   Procedure: LAPAROSCOPIC INSERTION CONTINUOUS AMBULATORY PERITONEAL DIALYSIS  (CAPD) CATHETER;  Surgeon: Katha Cabal, MD;  Location: [facility] ORS;  Service: Vascular;  Laterality: N/A;  . CATARACT EXTRACTION, BILATERAL Bilateral   . DIALYSIS/PERMA CATHETER INSERTION N/A 03/18/2017   Procedure: DIALYSIS/PERMA CATHETER INSERTION;  Surgeon: Katha Cabal, MD;  Location: [facility];  Service: Cardiovascular;  Laterality: N/A;  . DIALYSIS/PERMA CATHETER REMOVAL N/A 10/20/2017   Procedure: DIALYSIS/PERMA CATHETER REMOVAL;  Surgeon: Algernon Huxley, MD;  Location: [facility];  Service: Cardiovascular;  Laterality: N/A;  . EYE SURGERY     Family History  Problem Relation Age of Onset  . Hypertension Father    Social History   Tobacco Use  . Smoking status: Never Smoker  . Smokeless tobacco: Never Used  Substance Use Topics  . Alcohol use: No  . Drug use: No      Review of Systems Per HPI    Objective:   Physical Exam Vitals signs reviewed.  Constitutional:  General: She is not in acute distress.    Appearance: Normal appearance. She is obese. She is not ill-appearing, toxic-appearing or diaphoretic.  HENT:     Head: Normocephalic and atraumatic.  Eyes:     Conjunctiva/sclera: Conjunctivae normal.  Neck:     Musculoskeletal: Normal range of motion and neck supple.  Cardiovascular:     Rate and Rhythm: Normal rate and regular rhythm.     Heart sounds: Normal heart sounds.  Pulmonary:     Effort: Pulmonary effort is normal.     Breath sounds: Normal breath sounds.  Musculoskeletal:     Left foot:  Normal range of motion. No tenderness, bony tenderness or swelling.     Comments: Left foot without swelling, redness, tenderness to palpation. Toes discolored and thickened. Sensation intact. DP/PT pulses palpable.    Skin:    General: Skin is warm and dry.  Neurological:     Mental Status: She is alert and oriented to person, place, and time.  Psychiatric:        Mood and Affect: Mood normal.        Behavior: Behavior normal.        Thought Content: Thought content normal.        Judgment: Judgment normal.       BP (!) 154/60   Pulse (!) 58   Temp 97.7 F (36.5 C)   Resp 20   Ht 5\' 3"  (1.6 m)   SpO2 99%   BMI 33.75 kg/m  Wt Readings from Last 3 Encounters:  01/22/19 190 lb 8 oz (86.4 kg)  01/18/19 187 lb 4.8 oz (85 kg)  12/22/18 193 lb (87.5 kg)       Assessment & Plan:  1. Multiple falls - concerned about recent falls and whether or not she has appropriate equipment for ambulation in and out of the home. RN called and spoke with patient's daughter and she and the patient are open to having [place] come in and evaluate  - Ambulatory referral to [place]  2. Stage 5 chronic kidney disease on chronic dialysis [facility]) - She has had difficulty with transportation recently but the patient reports that she has been transferred to a closer dialysis center and they are going to help her with transportation.  Discussed this with the patient and if there are further difficulties encouraged her to let us know so that we can provide assistance as well. - Ambulatory referral to Home Health  3. Chronic heart failure with preserved ejection fraction ([place]) - Ambulatory referral to [place] follow-up with cardiology  4. Pain in both feet -I do not think she is currently having persistent gout flare, she has some baseline neuropathy and we discussed referral to podiatry and she is open to this - Ambulatory referral to Podiatry -She did have elevated uric acid level and is  currently on allopurinol 100 mg daily.  I have reached out to her nephrologist to see if the dose can be increased  5. Acquired dysmorphic toenail - Ambulatory referral to Podiatry  6. Type 2 diabetes mellitus with chronic kidney disease on chronic dialysis, without long-term current use of insulin ([place]) - Ambulatory referral to Podiatry  -Follow-up is on file  Clarene Reamer, FNP-BC  [facility] [place], [place]  01/27/2019 9:03 AM

## 2019-01-26 NOTE — Telephone Encounter (Signed)
Patient was seen today 01/26/2019

## 2019-01-27 ENCOUNTER — Encounter: Payer: Self-pay | Admitting: Family Medicine

## 2019-01-27 DIAGNOSIS — N186 End stage renal disease: Secondary | ICD-10-CM | POA: Diagnosis not present

## 2019-01-27 DIAGNOSIS — N2581 Secondary hyperparathyroidism of renal origin: Secondary | ICD-10-CM | POA: Diagnosis not present

## 2019-01-27 DIAGNOSIS — D631 Anemia in chronic kidney disease: Secondary | ICD-10-CM | POA: Diagnosis not present

## 2019-01-27 DIAGNOSIS — D509 Iron deficiency anemia, unspecified: Secondary | ICD-10-CM | POA: Diagnosis not present

## 2019-01-27 DIAGNOSIS — Z992 Dependence on renal dialysis: Secondary | ICD-10-CM | POA: Diagnosis not present

## 2019-01-30 DIAGNOSIS — D509 Iron deficiency anemia, unspecified: Secondary | ICD-10-CM | POA: Diagnosis not present

## 2019-01-30 DIAGNOSIS — Z992 Dependence on renal dialysis: Secondary | ICD-10-CM | POA: Diagnosis not present

## 2019-01-30 DIAGNOSIS — N186 End stage renal disease: Secondary | ICD-10-CM | POA: Diagnosis not present

## 2019-01-30 DIAGNOSIS — D631 Anemia in chronic kidney disease: Secondary | ICD-10-CM | POA: Diagnosis not present

## 2019-01-30 DIAGNOSIS — N2581 Secondary hyperparathyroidism of renal origin: Secondary | ICD-10-CM | POA: Diagnosis not present

## 2019-02-01 ENCOUNTER — Telehealth (INDEPENDENT_AMBULATORY_CARE_PROVIDER_SITE_OTHER): Payer: Self-pay

## 2019-02-01 DIAGNOSIS — D509 Iron deficiency anemia, unspecified: Secondary | ICD-10-CM | POA: Diagnosis not present

## 2019-02-01 DIAGNOSIS — D631 Anemia in chronic kidney disease: Secondary | ICD-10-CM | POA: Diagnosis not present

## 2019-02-01 DIAGNOSIS — N2581 Secondary hyperparathyroidism of renal origin: Secondary | ICD-10-CM | POA: Diagnosis not present

## 2019-02-01 DIAGNOSIS — Z992 Dependence on renal dialysis: Secondary | ICD-10-CM | POA: Diagnosis not present

## 2019-02-01 DIAGNOSIS — N186 End stage renal disease: Secondary | ICD-10-CM | POA: Diagnosis not present

## 2019-02-01 NOTE — Telephone Encounter (Signed)
Spoke with the patients daughter and she is scheduled for surgery on 02/16/2019 and will do pre-op on 02/12/2019 @ 1:00 pm and Covid testing on 02/15/2019 @ 8:00 am at [place]. The pre-surgical instructions will be mailed out to the patient.

## 2019-02-02 DIAGNOSIS — Z992 Dependence on renal dialysis: Secondary | ICD-10-CM | POA: Diagnosis not present

## 2019-02-02 DIAGNOSIS — D631 Anemia in chronic kidney disease: Secondary | ICD-10-CM | POA: Diagnosis not present

## 2019-02-02 DIAGNOSIS — D509 Iron deficiency anemia, unspecified: Secondary | ICD-10-CM | POA: Diagnosis not present

## 2019-02-02 DIAGNOSIS — N186 End stage renal disease: Secondary | ICD-10-CM | POA: Diagnosis not present

## 2019-02-02 DIAGNOSIS — N2581 Secondary hyperparathyroidism of renal origin: Secondary | ICD-10-CM | POA: Diagnosis not present

## 2019-02-03 DIAGNOSIS — Z992 Dependence on renal dialysis: Secondary | ICD-10-CM | POA: Diagnosis not present

## 2019-02-03 DIAGNOSIS — D509 Iron deficiency anemia, unspecified: Secondary | ICD-10-CM | POA: Diagnosis not present

## 2019-02-03 DIAGNOSIS — N186 End stage renal disease: Secondary | ICD-10-CM | POA: Diagnosis not present

## 2019-02-03 DIAGNOSIS — N2581 Secondary hyperparathyroidism of renal origin: Secondary | ICD-10-CM | POA: Diagnosis not present

## 2019-02-03 DIAGNOSIS — D631 Anemia in chronic kidney disease: Secondary | ICD-10-CM | POA: Diagnosis not present

## 2019-02-05 ENCOUNTER — Telehealth: Payer: Self-pay | Admitting: Family Medicine

## 2019-02-05 ENCOUNTER — Telehealth: Payer: Self-pay | Admitting: Internal Medicine

## 2019-02-05 DIAGNOSIS — I5032 Chronic diastolic (congestive) heart failure: Secondary | ICD-10-CM

## 2019-02-05 DIAGNOSIS — I480 Paroxysmal atrial fibrillation: Secondary | ICD-10-CM

## 2019-02-05 DIAGNOSIS — N186 End stage renal disease: Secondary | ICD-10-CM

## 2019-02-05 DIAGNOSIS — I10 Essential (primary) hypertension: Secondary | ICD-10-CM

## 2019-02-05 MED ORDER — METOPROLOL TARTRATE 25 MG PO TABS: 12.5000 mg | ORAL_TABLET | Freq: Two times a day (BID) | ORAL | 2 refills | Status: DC

## 2019-02-05 NOTE — Telephone Encounter (Signed)
Daughter, ok per DPR, calling to clarify patient's metoprolol and amlodipine. She wanted to make sure patient should be taking both or not. Advised her that from [place] on 7/13 and telephone note from 7/13 that patient should be taking both. She verbalized understanding that patient should take both and how they are prescribed. Refill for metoprolol sent to pharmacy.

## 2019-02-05 NOTE — Telephone Encounter (Signed)
Called patient and spoke with daughter. Order was placed for [facility] for the [facility] Therapist to order a Research officer, political party for the patient. The Therapist came there today and he told them he doesn't order walkers for patients. Please place a New DME order  for a New walker for the patient, hers is broken and she is only using a 3 prong cane right now.HH order mentioned a Electronics engineer.Please call patients daughter when New order can be picked up or whatever you do to order these.

## 2019-02-05 NOTE — Telephone Encounter (Signed)
Patients daughter calling in regarding her medication changes for BP medications (metoprolol and amlodipine). She would like to know if she should take both or just one. Please advise

## 2019-02-05 NOTE — Telephone Encounter (Signed)
Order for Rollator walker placed. Order printed, can we fax to home health supply store? Please also notify patient. Placed in [facility] in box.

## 2019-02-06 DIAGNOSIS — N186 End stage renal disease: Secondary | ICD-10-CM | POA: Diagnosis not present

## 2019-02-06 DIAGNOSIS — D509 Iron deficiency anemia, unspecified: Secondary | ICD-10-CM | POA: Diagnosis not present

## 2019-02-06 DIAGNOSIS — Z992 Dependence on renal dialysis: Secondary | ICD-10-CM | POA: Diagnosis not present

## 2019-02-06 DIAGNOSIS — N2581 Secondary hyperparathyroidism of renal origin: Secondary | ICD-10-CM | POA: Diagnosis not present

## 2019-02-06 DIAGNOSIS — D631 Anemia in chronic kidney disease: Secondary | ICD-10-CM | POA: Diagnosis not present

## 2019-02-06 NOTE — Telephone Encounter (Signed)
Clarification, HH was ordered to assess equipment needs in patient's home. It was never expected that they would order the equipment.

## 2019-02-06 NOTE — Telephone Encounter (Signed)
Spoken to patient's daughter and she stated that she will call patient's insurance to see where they can sent this to. She will call back.

## 2019-02-07 ENCOUNTER — Encounter (INDEPENDENT_AMBULATORY_CARE_PROVIDER_SITE_OTHER): Payer: Self-pay

## 2019-02-07 NOTE — Telephone Encounter (Signed)
Patient has been rescheduled to 02/23/2019 from 02/16/2019 due to scheduling issues. Patient will also have her pre-op and Covid testing rescheduled as follows, pre-op is on 02/16/2019 @ 1:45 pm and Covid test is on 02/19/2019 between 12:30-2:30 pm. This new information will be mailed to the patient.

## 2019-02-08 DIAGNOSIS — Z992 Dependence on renal dialysis: Secondary | ICD-10-CM | POA: Diagnosis not present

## 2019-02-08 DIAGNOSIS — D509 Iron deficiency anemia, unspecified: Secondary | ICD-10-CM | POA: Diagnosis not present

## 2019-02-08 DIAGNOSIS — N186 End stage renal disease: Secondary | ICD-10-CM | POA: Diagnosis not present

## 2019-02-08 DIAGNOSIS — D631 Anemia in chronic kidney disease: Secondary | ICD-10-CM | POA: Diagnosis not present

## 2019-02-08 DIAGNOSIS — N2581 Secondary hyperparathyroidism of renal origin: Secondary | ICD-10-CM | POA: Diagnosis not present

## 2019-02-09 DIAGNOSIS — N186 End stage renal disease: Secondary | ICD-10-CM | POA: Diagnosis not present

## 2019-02-09 DIAGNOSIS — Z992 Dependence on renal dialysis: Secondary | ICD-10-CM | POA: Diagnosis not present

## 2019-02-10 DIAGNOSIS — D631 Anemia in chronic kidney disease: Secondary | ICD-10-CM | POA: Diagnosis not present

## 2019-02-10 DIAGNOSIS — D509 Iron deficiency anemia, unspecified: Secondary | ICD-10-CM | POA: Diagnosis not present

## 2019-02-10 DIAGNOSIS — Z992 Dependence on renal dialysis: Secondary | ICD-10-CM | POA: Diagnosis not present

## 2019-02-10 DIAGNOSIS — N186 End stage renal disease: Secondary | ICD-10-CM | POA: Diagnosis not present

## 2019-02-10 DIAGNOSIS — N2581 Secondary hyperparathyroidism of renal origin: Secondary | ICD-10-CM | POA: Diagnosis not present

## 2019-02-11 ENCOUNTER — Other Ambulatory Visit (INDEPENDENT_AMBULATORY_CARE_PROVIDER_SITE_OTHER): Payer: Self-pay | Admitting: Nurse Practitioner

## 2019-02-12 ENCOUNTER — Other Ambulatory Visit: Payer: Medicare Other

## 2019-02-13 DIAGNOSIS — D509 Iron deficiency anemia, unspecified: Secondary | ICD-10-CM | POA: Diagnosis not present

## 2019-02-13 DIAGNOSIS — N2581 Secondary hyperparathyroidism of renal origin: Secondary | ICD-10-CM | POA: Diagnosis not present

## 2019-02-13 DIAGNOSIS — D631 Anemia in chronic kidney disease: Secondary | ICD-10-CM | POA: Diagnosis not present

## 2019-02-13 DIAGNOSIS — Z992 Dependence on renal dialysis: Secondary | ICD-10-CM | POA: Diagnosis not present

## 2019-02-13 DIAGNOSIS — N186 End stage renal disease: Secondary | ICD-10-CM | POA: Diagnosis not present

## 2019-02-15 ENCOUNTER — Other Ambulatory Visit: Payer: Medicare Other

## 2019-02-15 ENCOUNTER — Other Ambulatory Visit (INDEPENDENT_AMBULATORY_CARE_PROVIDER_SITE_OTHER): Payer: Self-pay | Admitting: Nurse Practitioner

## 2019-02-15 DIAGNOSIS — Z992 Dependence on renal dialysis: Secondary | ICD-10-CM | POA: Diagnosis not present

## 2019-02-15 DIAGNOSIS — N186 End stage renal disease: Secondary | ICD-10-CM | POA: Diagnosis not present

## 2019-02-15 DIAGNOSIS — N2581 Secondary hyperparathyroidism of renal origin: Secondary | ICD-10-CM | POA: Diagnosis not present

## 2019-02-15 DIAGNOSIS — D631 Anemia in chronic kidney disease: Secondary | ICD-10-CM | POA: Diagnosis not present

## 2019-02-15 DIAGNOSIS — D509 Iron deficiency anemia, unspecified: Secondary | ICD-10-CM | POA: Diagnosis not present

## 2019-02-16 ENCOUNTER — Encounter
Admission: RE | Admit: 2019-02-16 | Discharge: 2019-02-16 | Disposition: A | Discharge status: Home / Self Care | Payer: Medicare Other | Source: Ambulatory Visit | Attending: Vascular Surgery | Admitting: Vascular Surgery

## 2019-02-16 ENCOUNTER — Other Ambulatory Visit: Payer: Self-pay

## 2019-02-16 ENCOUNTER — Other Ambulatory Visit: Payer: Medicare Other

## 2019-02-16 DIAGNOSIS — Z20828 Contact with and (suspected) exposure to other viral communicable diseases: Secondary | ICD-10-CM | POA: Diagnosis not present

## 2019-02-16 DIAGNOSIS — Z01812 Encounter for preprocedural laboratory examination: Secondary | ICD-10-CM | POA: Insufficient documentation

## 2019-02-16 DIAGNOSIS — N186 End stage renal disease: Secondary | ICD-10-CM | POA: Diagnosis not present

## 2019-02-16 LAB — BASIC METABOLIC PANEL
Anion gap: 19 — ABNORMAL HIGH (ref 5–15)
BUN: 36 mg/dL — ABNORMAL HIGH (ref 8–23)
CO2: 24 mmol/L (ref 22–32)
Calcium: 9.7 mg/dL (ref 8.9–10.3)
Chloride: 95 mmol/L — ABNORMAL LOW (ref 98–111)
Creatinine, Ser: 7.91 mg/dL — ABNORMAL HIGH (ref 0.44–1.00)
GFR calc Af Amer: 5 mL/min — ABNORMAL LOW (ref >60)
GFR calc non Af Amer: 4 mL/min — ABNORMAL LOW (ref >60)
Glucose, Bld: 108 mg/dL — ABNORMAL HIGH (ref 70–99)
Potassium: 4.8 mmol/L (ref 3.5–5.1)
Sodium: 138 mmol/L (ref 135–145)

## 2019-02-16 LAB — CBC WITH DIFFERENTIAL/PLATELET
Abs Immature Granulocytes: 0.06 10*3/uL (ref 0.00–0.07)
Basophils Absolute: 0.1 10*3/uL (ref 0.0–0.1)
Basophils Relative: 1 %
Eosinophils Absolute: 0.3 10*3/uL (ref 0.0–0.5)
Eosinophils Relative: 2 %
HCT: 43.4 % (ref 36.0–46.0)
Hemoglobin: 13.4 g/dL (ref 12.0–15.0)
Immature Granulocytes: 1 %
Lymphocytes Relative: 26 %
Lymphs Abs: 2.8 10*3/uL (ref 0.7–4.0)
MCH: 33 pg (ref 26.0–34.0)
MCHC: 30.9 g/dL (ref 30.0–36.0)
MCV: 106.9 fL — ABNORMAL HIGH (ref 80.0–100.0)
Monocytes Absolute: 1.1 10*3/uL — ABNORMAL HIGH (ref 0.1–1.0)
Monocytes Relative: 10 %
Neutro Abs: 6.3 10*3/uL (ref 1.7–7.7)
Neutrophils Relative %: 60 %
Platelets: 392 10*3/uL (ref 150–400)
RBC: 4.06 MIL/uL (ref 3.87–5.11)
RDW: 15.3 % (ref 11.5–15.5)
WBC: 10.6 10*3/uL — ABNORMAL HIGH (ref 4.0–10.5)
nRBC: 0 % (ref 0.0–0.2)

## 2019-02-16 LAB — PROTIME-INR
INR: 1.5 — ABNORMAL HIGH (ref 0.8–1.2)
Prothrombin Time: 17.9 seconds — ABNORMAL HIGH (ref 11.4–15.2)

## 2019-02-16 LAB — TYPE AND SCREEN
ABO/RH(D): B POS
Antibody Screen: NEGATIVE

## 2019-02-16 LAB — APTT: aPTT: 37 seconds — ABNORMAL HIGH (ref 24–36)

## 2019-02-16 NOTE — Patient Instructions (Signed)
Your procedure is scheduled on: 02-23-19 FRIDAY Report to Same Day Surgery 2nd floor medical mall [facility] Entrance-take elevator on left to 2nd floor.  Check in with surgery information desk.) To find out your arrival time please call [phone] between 1PM - 3PM on 02-22-19 THURSDAY  Remember: Instructions that are not followed completely may result in serious medical risk, up to and including death, or upon the discretion of your surgeon and anesthesiologist your surgery may need to be rescheduled.    _x___ 1. Do not eat food after midnight the night before your procedure. NO GUM OR CANDY AFTER MIDNIGHT. You may drink WATER up to 2 hours before you are scheduled to arrive at the hospital for your procedure.  Do not drink WATER within 2 hours of your scheduled arrival to the hospital.  Type 1 and type 2 diabetics should only drink water.   ____Ensure clear carbohydrate drink on the way to the hospital for bariatric patients  ____Ensure clear carbohydrate drink 3 hours before surgery.     __x__ 2. No Alcohol for 24 hours before or after surgery.   __x__3. No Smoking or e-cigarettes for 24 prior to surgery.  Do not use any chewable tobacco products for at least 6 hour prior to surgery   ____  4. Bring all medications with you on the day of surgery if instructed.    __x__ 5. Notify your doctor if there is any change in your medical condition     (cold, fever, infections).    x___6. On the morning of surgery brush your teeth with toothpaste and water.  You may rinse your mouth with mouth wash if you wish.  Do not swallow any toothpaste or mouthwash.   Do not wear jewelry, make-up, hairpins, clips or nail polish.  Do not wear lotions, powders, or perfumes. You may wear deodorant.  Do not shave 48 hours prior to surgery. Men may shave face and neck.  Do not bring valuables to the hospital.    [facility] is not responsible for any belongings or valuables.               Contacts,  dentures or bridgework may not be worn into surgery.  Leave your suitcase in the car. After surgery it may be brought to your room.  For patients admitted to the hospital, discharge time is determined by your treatment team.  _  Patients discharged the day of surgery will not be allowed to drive home.  You will need someone to drive you home and stay with you the night of your procedure.    Please read over the following fact sheets that you were given:   [facility] Preparing for Surgery   _x___ Take anti-hypertensive listed below, cardiac, seizure, asthma, anti-reflux and psychiatric medicines. These include:  1. I WILL CALL YOUR DAUGHTER AND REVIEW MEDICINES WITH HER AND LET HER KNOW WHAT MEDICINE SHE NEEDS TO GIVE YOU DAY OF SURGERY  2.  3.  4.  5.  6.  ____Fleets enema or Magnesium Citrate as directed.   _x___ Use CHG Soap or sage wipes as directed on instruction sheet   ____ Use inhalers on the day of surgery and bring to hospital day of surgery  ____ Stop Metformin and Janumet 2 days prior to surgery.    ____ Take 1/2 of usual insulin dose the night before surgery and none on the morning surgery.   _x___ Follow recommendations from Cardiologist, Pulmonologist or PCP regarding  stopping Aspirin, Coumadin, Plavix ,Eliquis, Effient, or Pradaxa, and Pletal-CALL DR SCHNIER'S OFFICE REGARDING WHEN TO STOP YOUR ELIQUIS  X____Stop Anti-inflammatories such as Advil, Aleve, Ibuprofen, Motrin, Naproxen, Naprosyn, Goodies powders or aspirin products NOW-OK to take Tylenol   ____ Stop supplements until after surgery.    ____ Bring C-Pap to the hospital.

## 2019-02-17 DIAGNOSIS — N2581 Secondary hyperparathyroidism of renal origin: Secondary | ICD-10-CM | POA: Diagnosis not present

## 2019-02-17 DIAGNOSIS — Z992 Dependence on renal dialysis: Secondary | ICD-10-CM | POA: Diagnosis not present

## 2019-02-17 DIAGNOSIS — D631 Anemia in chronic kidney disease: Secondary | ICD-10-CM | POA: Diagnosis not present

## 2019-02-17 DIAGNOSIS — D509 Iron deficiency anemia, unspecified: Secondary | ICD-10-CM | POA: Diagnosis not present

## 2019-02-17 DIAGNOSIS — N186 End stage renal disease: Secondary | ICD-10-CM | POA: Diagnosis not present

## 2019-02-19 ENCOUNTER — Other Ambulatory Visit: Payer: Self-pay

## 2019-02-19 ENCOUNTER — Other Ambulatory Visit
Admission: RE | Admit: 2019-02-19 | Discharge: 2019-02-19 | Disposition: A | Discharge status: Home / Self Care | Payer: Medicare Other | Source: Ambulatory Visit | Attending: Physician Assistant | Admitting: Physician Assistant

## 2019-02-19 DIAGNOSIS — Z20828 Contact with and (suspected) exposure to other viral communicable diseases: Secondary | ICD-10-CM | POA: Diagnosis not present

## 2019-02-19 DIAGNOSIS — Z01812 Encounter for preprocedural laboratory examination: Secondary | ICD-10-CM | POA: Diagnosis not present

## 2019-02-19 DIAGNOSIS — N186 End stage renal disease: Secondary | ICD-10-CM | POA: Diagnosis not present

## 2019-02-19 LAB — SARS CORONAVIRUS 2 (TAT 6-24 HRS): SARS Coronavirus 2: NEGATIVE

## 2019-02-19 NOTE — Pre-Procedure Instructions (Signed)
[name], PA-C  Physician Assistant Certified  Cardiology  Progress Notes    Signed  Encounter Date:  01/22/2019          Signed      Expand All Collapse All    Show:Clear all _0 Manual_1 Template_2 Copied  Added by: _3 [name], PA-C  _4 Hover for details    Cardiology Office Note Date:  01/22/2019  Patient ID:  Stacy Pham, Stacy Pham [birth year], MRN [id number] PCP:  Elby Beck, FNP        Cardiologist:  Dr. Saunders Revel, MD    Chief Complaint: Follow up Afib  History of Present Illness: Stacy Pham is a 85 y.o. female with history of PAF on Eliquis, ESRD on HD (TTS), HFpEF, anemia of chronic disease, DM2, HTN, and gout who presents for follow up of her Afib.   Patient has been managed on Eliquis since diagnosis of A. fib in 2018.  She was previously on hemodialysis though transitioned to PD in 08/2017.  Echo from 10/2017 showed an EF of 60 to 24%, grade 1 diastolic dysfunction, mild MR, PASP 40 mmHg.  More recently, it was recommended she transition back to hemodialysis by her nephrologist.  She was scheduled to undergo AV fistula placement in 07/2018 though upon presentation she was noted to be in A. fib with RVR and hypertensive.  Case was canceled and she was seen in the PACU.  Her carvedilol was transitioned to metoprolol.  Of note, the patient was asymptomatic in this setting.  She subsequently underwent AV fistula placement on 09/13/2018.  Patient was recently admitted to [facility] in early 01/2019 for gout flare of the foot. Initial potassium was elevated at 7.3. CXR showed pulmonary vascular congestion with suspected degree of volume overload. She was treated by IM and underwent HD.  It appears that she maintains sinus rhythm during the admission.  She comes in doing well from a cardiac perspective.  No chest pain, shortness of breath, palpitations, dizziness, recently, or syncope.  No falls, BRBPR, or melena.  No lower extremity swelling, abdominal distention, orthopnea,  PND, early satiety.  Her main concerns today are noncardiac and related to 1) while she was prescribed prednisone time of discharge for her gout and 2) chronic low back pain for which she takes Tylenol.  She also states her blood pressure seems to be trending on the high side during hemodialysis.  Labs:  01/2019 - Potassium 4.9, uric acid 8.1, AST/ALT normal, albumin 3.8, HGB 8.9 08/2018 - A1c 6.7 04/2018 - LDL 111 02/2018 - TSH normal       Past Medical History:  Diagnosis Date   (HFpEF) heart failure with preserved ejection fraction ([place])    a. 10/2017 Echo: EF 60-65%, Gr1 DD, mild MR, mildly dil LA/RA. PASP 104mHg.   Arthritis    Back pain    Bronchitis    Cataract    Diabetes mellitus without complication ([place]    [place]    ESRD ([place] stage renal disease) ([place]    a. 2018 - initially on HD but then transitioned to nightly PD in 08/2017.   Gout    Hypertension    PAF (paroxysmal atrial fibrillation) (HCC)    a. CHA2DS2VASc = 5-->eliquis 2.5 BID.         Past Surgical History:  Procedure Laterality Date   AV FISTULA PLACEMENT Right 09/13/2018   Procedure: INSERTION OF ARTERIOVENOUS (AV) GORE-TEX GRAFT ARM;  Surgeon: SKatha Cabal MD;  Location: [facility] ORS;  Service: Vascular;  Laterality: Right;   CAPD INSERTION N/A 06/08/2017   Procedure: LAPAROSCOPIC INSERTION CONTINUOUS AMBULATORY PERITONEAL DIALYSIS  (CAPD) CATHETER;  Surgeon: Katha Cabal, MD;  Location: [facility] ORS;  Service: Vascular;  Laterality: N/A;   CATARACT EXTRACTION, BILATERAL Bilateral    DIALYSIS/PERMA CATHETER INSERTION N/A 03/18/2017   Procedure: DIALYSIS/PERMA CATHETER INSERTION;  Surgeon: Katha Cabal, MD;  Location: [place] [facility];  Service: Cardiovascular;  Laterality: N/A;   DIALYSIS/PERMA CATHETER REMOVAL N/A 10/20/2017   Procedure: DIALYSIS/PERMA CATHETER REMOVAL;  Surgeon: Algernon Huxley, MD;  Location: [place] [facility];  Service:  Cardiovascular;  Laterality: N/A;   EYE SURGERY      Active Medications      Current Meds  Medication Sig   acetaminophen (TYLENOL 8 HOUR ARTHRITIS PAIN) 650 MG CR tablet Take 650 mg every 8 (eight) hours as needed by mouth for pain.   allopurinol (ZYLOPRIM) 100 MG tablet Take 1 tablet (100 mg total) by mouth at bedtime.   amiodarone (PACERONE) 200 MG tablet Take 1 tablet (200 mg total) by mouth 2 (two) times daily. Take 1 tablet (200 mg) by mouth two times a day for 2 weeks, then decrease to 1 tablet (200 mg) by mouth once a day.   apixaban (ELIQUIS) 2.5 MG TABS tablet Take 1 tablet (2.5 mg total) by mouth 2 (two) times daily.   atorvastatin (LIPITOR) 20 MG tablet Take 1 tablet (20 mg total) by mouth every evening.   blood glucose meter kit and supplies KIT Please dispense either One Touch or Bayer Contour She must have one of these machines since she is on peritoneal dialysis. Use up to four times daily as directed. (FOR ICD-9 250.00, 250.01).   calcitRIOL (ROCALTROL) 0.25 MCG capsule Take 0.25 mcg by mouth daily.   cinacalcet (SENSIPAR) 30 MG tablet Take 1 tablet (30 mg total) by mouth daily with supper. (Patient taking differently: Take 30 mg by mouth daily with supper. With largest meal)   colchicine 0.6 MG tablet Take 1 tablet (0.6 mg total) by mouth daily as needed (for gout flare ups).   ferric citrate (AURYXIA) 1 GM 210 MG(Fe) tablet Take 210 mg by mouth 3 (three) times daily with meals.   gabapentin (NEURONTIN) 100 MG capsule Take 2 capsules (200 mg total) by mouth 3 (three) times daily.   metoprolol tartrate (LOPRESSOR) 25 MG tablet Take 1 tablet (25 mg total) by mouth daily.      Allergies:   Patient has no known allergies.   Social History:  The patient  reports that she has never smoked. She has never used smokeless tobacco. She reports that she does not drink alcohol or use drugs.   Family History:  The patient's family history includes Hypertension in her  father.  ROS:   Review of Systems  Constitutional: Negative for chills, diaphoresis, fever, malaise/fatigue and weight loss.  HENT: Negative for congestion.   Eyes: Negative for discharge and redness.  Respiratory: Negative for cough, hemoptysis, sputum production, shortness of breath and wheezing.   Cardiovascular: Negative for chest pain, palpitations, orthopnea, claudication, leg swelling and PND.  Gastrointestinal: Negative for abdominal pain, blood in stool, heartburn, melena, nausea and vomiting.  Genitourinary: Negative for hematuria.  Musculoskeletal: Positive for back pain and joint pain. Negative for falls and myalgias.  Skin: Negative for rash.  Neurological: Negative for dizziness, tingling, tremors, sensory change, speech change, focal weakness, loss of consciousness and weakness.  Endo/Heme/Allergies: Does not bruise/bleed easily.  Psychiatric/Behavioral: Negative for substance  abuse. The patient is not nervous/anxious.   All other systems reviewed and are negative.    PHYSICAL EXAM:  VS:  BP (!) 150/70 (BP Location: Left Arm, Patient Position: Sitting, Cuff Size: Normal)    Pulse (!) 59    Temp (!) 97 F (36.1 C)    Ht '5\' 3"'$  (1.6 m)    Wt 190 lb 8 oz (86.4 kg)    SpO2 97%    BMI 33.75 kg/m  BMI: Body mass index is 33.75 kg/m.  Physical Exam  Constitutional: She is oriented to person, place, and time. She appears well-developed and well-nourished.  HENT:  Head: Normocephalic and atraumatic.  Eyes: Right eye exhibits no discharge. Left eye exhibits no discharge.  Neck: Normal range of motion. No JVD present.  Cardiovascular: Regular rhythm, S1 normal, S2 normal and normal heart sounds. Bradycardia present. Exam reveals no distant heart sounds, no friction rub, no midsystolic click and no opening snap.  No murmur heard. Pulmonary/Chest: Effort normal and breath sounds normal. No respiratory distress. She has no decreased breath sounds. She has no wheezes. She has no  rales. She exhibits no tenderness.  Abdominal: Soft. She exhibits no distension. There is no abdominal tenderness.  Musculoskeletal:        General: No edema.  Neurological: She is alert and oriented to person, place, and time.  Skin: Skin is warm and dry. No cyanosis. Nails show no clubbing.  Psychiatric: She has a normal mood and affect. Her speech is normal and behavior is normal. Judgment and thought content normal.     EKG:  Was ordered and interpreted by me today. Shows sinus bradycardia, 59 bpm, no acute ST-T changes  Recent Labs: 02/21/2018: TSH 3.308 03/17/2018: Magnesium 1.6 01/18/2019: ALT 18; BUN 70; Creatinine, Ser 18.30; Hemoglobin 8.9; Platelets 360; Sodium 137 01/19/2019: Potassium 4.9  05/03/2018: Chol/HDL Ratio 3.9; Cholesterol, Total 179; HDL 46; LDL Calculated 111; Triglycerides 108   Estimated Creatinine Clearance: 2.3 mL/min (A) (by C-G formula based on SCr of 18.3 mg/dL (H)).      Wt Readings from Last 3 Encounters:  01/22/19 190 lb 8 oz (86.4 kg)  01/18/19 187 lb 4.8 oz (85 kg)  12/22/18 193 lb (87.5 kg)     Other studies reviewed: Additional studies/records reviewed today include: summarized above  ASSESSMENT AND PLAN:  1. PAF: Maintaining sinus rhythm with a mildly bradycardic heart rate.  She has been taking Lopressor 25 mg once daily, presumably in the setting of issues with relative hypotension surrounding her dialysis.  We will change her to Lopressor 12.5 mg twice daily for more consistent dosing/BP control.  Continue amiodarone 200 mg daily.  Recent LFT and TSH normal.  Schedule PFTs given recently started amiodarone.  She will need routine follow up of her LFT, thyroid function, as well as regular eye exams.  CHADS2VASc at least 6 (HTN, age x 2, DM, vascular disease, female).  Continue Eliquis 2.5 mg twice daily as she meets reduced dosing criteria with age and renal function.  2. HFpEF: She appears euvolemic and well compensated.  Volume  managed by hemodialysis.  3. ESRD: HD per nephrology.   4. Hypertension: Blood pressure is mildly elevated today at 150/70, though she has issues with relative hypotension during HD.  However, she does state her blood pressure has been trending up with hemodialysis which would certainly be a new finding for her.  In this setting, we will call her dialysis center to request recent BP readings with follow-up  recommendation of antihypertensives after we have this information.  For now, we will change her Lopressor from 25 mg daily to 12.5 mg twice daily as outlined above.    5. Anemia of chronic disease: Most recent hemoglobin stable.  Follow-up with PCP as directed later this week.  6. Gout: Follow-up with PCP.  7. Chronic low back pain: Follow-up with PCP.  Disposition: F/u with Dr. Saunders Revel or an APP in 6 months.  Current medicines are reviewed at length with the patient today.  The patient did not have any concerns regarding medicines.  Signed, Christell Faith, PA-C 01/22/2019 8:42 AM     [facility] [place] [address] [place], [place] [address] [phone]        Electronically signed by [name], PA-C at 01/22/2019 9:41 AM   Office Visit on 01/22/2019     Detailed Report     Note shared with patient

## 2019-02-21 ENCOUNTER — Ambulatory Visit: Payer: Medicare Other | Attending: Physician Assistant

## 2019-02-21 DIAGNOSIS — Z992 Dependence on renal dialysis: Secondary | ICD-10-CM | POA: Diagnosis not present

## 2019-02-21 DIAGNOSIS — N2581 Secondary hyperparathyroidism of renal origin: Secondary | ICD-10-CM | POA: Diagnosis not present

## 2019-02-21 DIAGNOSIS — D631 Anemia in chronic kidney disease: Secondary | ICD-10-CM | POA: Diagnosis not present

## 2019-02-21 DIAGNOSIS — N186 End stage renal disease: Secondary | ICD-10-CM | POA: Diagnosis not present

## 2019-02-21 DIAGNOSIS — D509 Iron deficiency anemia, unspecified: Secondary | ICD-10-CM | POA: Diagnosis not present

## 2019-02-22 DIAGNOSIS — Z992 Dependence on renal dialysis: Secondary | ICD-10-CM | POA: Diagnosis not present

## 2019-02-22 DIAGNOSIS — N2581 Secondary hyperparathyroidism of renal origin: Secondary | ICD-10-CM | POA: Diagnosis not present

## 2019-02-22 DIAGNOSIS — N186 End stage renal disease: Secondary | ICD-10-CM | POA: Diagnosis not present

## 2019-02-22 DIAGNOSIS — D631 Anemia in chronic kidney disease: Secondary | ICD-10-CM | POA: Diagnosis not present

## 2019-02-22 DIAGNOSIS — D509 Iron deficiency anemia, unspecified: Secondary | ICD-10-CM | POA: Diagnosis not present

## 2019-02-22 MED ORDER — CEFAZOLIN SODIUM-DEXTROSE 1-4 GM/50ML-% IV SOLN
1.0000 g | INTRAVENOUS | Status: AC
  Administered 2019-02-23: 1 g via INTRAVENOUS

## 2019-02-23 ENCOUNTER — Ambulatory Visit: Payer: Medicare Other | Admitting: Anesthesiology

## 2019-02-23 ENCOUNTER — Encounter: Payer: Self-pay | Admitting: *Deleted

## 2019-02-23 ENCOUNTER — Encounter
Admission: RE | Disposition: A | Discharge status: Home / Self Care | Payer: Self-pay | Source: Home / Self Care | Attending: Vascular Surgery

## 2019-02-23 ENCOUNTER — Ambulatory Visit
Admission: RE | Admit: 2019-02-23 | Discharge: 2019-02-23 | Disposition: A | Discharge status: Home / Self Care | Payer: Medicare Other | Attending: Vascular Surgery | Admitting: Vascular Surgery

## 2019-02-23 ENCOUNTER — Other Ambulatory Visit: Payer: Self-pay

## 2019-02-23 DIAGNOSIS — M1990 Unspecified osteoarthritis, unspecified site: Secondary | ICD-10-CM | POA: Insufficient documentation

## 2019-02-23 DIAGNOSIS — M109 Gout, unspecified: Secondary | ICD-10-CM | POA: Insufficient documentation

## 2019-02-23 DIAGNOSIS — N186 End stage renal disease: Secondary | ICD-10-CM | POA: Insufficient documentation

## 2019-02-23 DIAGNOSIS — I480 Paroxysmal atrial fibrillation: Secondary | ICD-10-CM | POA: Diagnosis not present

## 2019-02-23 DIAGNOSIS — Z79899 Other long term (current) drug therapy: Secondary | ICD-10-CM | POA: Insufficient documentation

## 2019-02-23 DIAGNOSIS — E1140 Type 2 diabetes mellitus with diabetic neuropathy, unspecified: Secondary | ICD-10-CM | POA: Diagnosis not present

## 2019-02-23 DIAGNOSIS — I5032 Chronic diastolic (congestive) heart failure: Secondary | ICD-10-CM | POA: Insufficient documentation

## 2019-02-23 DIAGNOSIS — Z7901 Long term (current) use of anticoagulants: Secondary | ICD-10-CM | POA: Diagnosis not present

## 2019-02-23 DIAGNOSIS — Z992 Dependence on renal dialysis: Secondary | ICD-10-CM

## 2019-02-23 DIAGNOSIS — E1122 Type 2 diabetes mellitus with diabetic chronic kidney disease: Secondary | ICD-10-CM | POA: Insufficient documentation

## 2019-02-23 DIAGNOSIS — I132 Hypertensive heart and chronic kidney disease with heart failure and with stage 5 chronic kidney disease, or end stage renal disease: Secondary | ICD-10-CM | POA: Diagnosis not present

## 2019-02-23 DIAGNOSIS — Z4902 Encounter for fitting and adjustment of peritoneal dialysis catheter: Secondary | ICD-10-CM | POA: Insufficient documentation

## 2019-02-23 DIAGNOSIS — I509 Heart failure, unspecified: Secondary | ICD-10-CM | POA: Diagnosis not present

## 2019-02-23 DIAGNOSIS — T82898A Other specified complication of vascular prosthetic devices, implants and grafts, initial encounter: Secondary | ICD-10-CM

## 2019-02-23 DIAGNOSIS — I120 Hypertensive chronic kidney disease with stage 5 chronic kidney disease or end stage renal disease: Secondary | ICD-10-CM

## 2019-02-23 DIAGNOSIS — E785 Hyperlipidemia, unspecified: Secondary | ICD-10-CM | POA: Diagnosis not present

## 2019-02-23 HISTORY — PX: REMOVAL OF A DIALYSIS CATHETER: SHX6053

## 2019-02-23 LAB — GLUCOSE, CAPILLARY: Glucose-Capillary: 171 mg/dL — ABNORMAL HIGH (ref 70–99)

## 2019-02-23 LAB — POCT I-STAT 4, (NA,K, GLUC, HGB,HCT)
Glucose, Bld: 133 mg/dL — ABNORMAL HIGH (ref 70–99)
HCT: 47 % — ABNORMAL HIGH (ref 36.0–46.0)
Hemoglobin: 16 g/dL — ABNORMAL HIGH (ref 12.0–15.0)
Potassium: 4.1 mmol/L (ref 3.5–5.1)
Sodium: 135 mmol/L (ref 135–145)

## 2019-02-23 SURGERY — REMOVAL, DIALYSIS CATHETER
Anesthesia: General | Site: Abdomen

## 2019-02-23 MED ORDER — LIDOCAINE HCL (CARDIAC) PF 100 MG/5ML IV SOSY
PREFILLED_SYRINGE | INTRAVENOUS | Status: DC | PRN
  Administered 2019-02-23: 60 mg via INTRAVENOUS

## 2019-02-23 MED ORDER — SODIUM CHLORIDE 0.45 % IV BOLUS: 100.0000 mL | Freq: Once | INTRAVENOUS | Status: DC

## 2019-02-23 MED ORDER — PROPOFOL 10 MG/ML IV BOLUS
INTRAVENOUS | Status: DC | PRN
  Administered 2019-02-23 (×2): 30 mg via INTRAVENOUS
  Administered 2019-02-23: 70 mg via INTRAVENOUS
  Administered 2019-02-23: 20 mg via INTRAVENOUS
  Administered 2019-02-23 (×2): 30 mg via INTRAVENOUS
  Administered 2019-02-23 (×2): 70 mg via INTRAVENOUS

## 2019-02-23 MED ORDER — FENTANYL CITRATE (PF) 100 MCG/2ML IJ SOLN
INTRAMUSCULAR | Status: DC | PRN
  Administered 2019-02-23 (×2): 25 ug via INTRAVENOUS

## 2019-02-23 MED ORDER — FAMOTIDINE 20 MG PO TABS
20.0000 mg | ORAL_TABLET | Freq: Once | ORAL | Status: AC
  Administered 2019-02-23: 20 mg via ORAL

## 2019-02-23 MED ORDER — BUPIVACAINE LIPOSOME 1.3 % IJ SUSP
INTRAMUSCULAR | Status: DC | PRN
  Administered 2019-02-23: 09:00:00 21 mL

## 2019-02-23 MED ORDER — DEXAMETHASONE SODIUM PHOSPHATE 10 MG/ML IJ SOLN
INTRAMUSCULAR | Status: DC | PRN
  Administered 2019-02-23: 4 mg via INTRAVENOUS

## 2019-02-23 MED ORDER — HYDROCODONE-ACETAMINOPHEN 5-325 MG PO TABS: 1.0000 | ORAL_TABLET | Freq: Four times a day (QID) | ORAL | 0 refills | Status: DC | PRN

## 2019-02-23 MED ORDER — BUPIVACAINE HCL 0.5 % IJ SOLN: INTRAMUSCULAR | Status: DC | PRN

## 2019-02-23 MED ORDER — BACITRACIN 500 UNIT/GM EX OINT
TOPICAL_OINTMENT | CUTANEOUS | Status: DC | PRN
  Administered 2019-02-23: 1 via TOPICAL

## 2019-02-23 MED ORDER — PHENYLEPHRINE HCL (PRESSORS) 10 MG/ML IV SOLN
INTRAVENOUS | Status: AC
  Filled 2019-02-23: qty 1

## 2019-02-23 MED ORDER — ONDANSETRON HCL 4 MG/2ML IJ SOLN
INTRAMUSCULAR | Status: AC
  Filled 2019-02-23: qty 2

## 2019-02-23 MED ORDER — EPHEDRINE SULFATE 50 MG/ML IJ SOLN
INTRAMUSCULAR | Status: DC | PRN
  Administered 2019-02-23: 15 mg via INTRAVENOUS
  Administered 2019-02-23: 10 mg via INTRAVENOUS

## 2019-02-23 MED ORDER — LIDOCAINE HCL (PF) 2 % IJ SOLN
INTRAMUSCULAR | Status: AC
  Filled 2019-02-23: qty 10

## 2019-02-23 MED ORDER — PROPOFOL 10 MG/ML IV BOLUS
INTRAVENOUS | Status: AC
  Filled 2019-02-23: qty 20

## 2019-02-23 MED ORDER — FENTANYL CITRATE (PF) 100 MCG/2ML IJ SOLN
INTRAMUSCULAR | Status: AC
  Filled 2019-02-23: qty 2

## 2019-02-23 MED ORDER — OXYCODONE HCL 5 MG/5ML PO SOLN: 5.0000 mg | Freq: Once | ORAL | Status: DC | PRN

## 2019-02-23 MED ORDER — FENTANYL CITRATE (PF) 100 MCG/2ML IJ SOLN: 25.0000 ug | INTRAMUSCULAR | Status: DC | PRN

## 2019-02-23 MED ORDER — BUPIVACAINE LIPOSOME 1.3 % IJ SUSP
INTRAMUSCULAR | Status: AC
  Filled 2019-02-23: qty 20

## 2019-02-23 MED ORDER — DEXAMETHASONE SODIUM PHOSPHATE 10 MG/ML IJ SOLN
INTRAMUSCULAR | Status: AC
  Filled 2019-02-23: qty 1

## 2019-02-23 MED ORDER — SODIUM CHLORIDE 0.9 % IV SOLN
INTRAVENOUS | Status: DC
  Administered 2019-02-23 (×2): via INTRAVENOUS

## 2019-02-23 MED ORDER — PHENYLEPHRINE HCL (PRESSORS) 10 MG/ML IV SOLN
INTRAVENOUS | Status: DC | PRN
  Administered 2019-02-23: 100 ug via INTRAVENOUS
  Administered 2019-02-23 (×3): 200 ug via INTRAVENOUS

## 2019-02-23 MED ORDER — CHLORHEXIDINE GLUCONATE CLOTH 2 % EX PADS: 6.0000 | MEDICATED_PAD | Freq: Once | CUTANEOUS | Status: DC

## 2019-02-23 MED ORDER — VASOPRESSIN 20 UNIT/ML IV SOLN
INTRAVENOUS | Status: AC
  Filled 2019-02-23: qty 1

## 2019-02-23 MED ORDER — BUPIVACAINE HCL (PF) 0.5 % IJ SOLN
INTRAMUSCULAR | Status: AC
  Filled 2019-02-23: qty 30

## 2019-02-23 MED ORDER — CEFAZOLIN SODIUM-DEXTROSE 1-4 GM/50ML-% IV SOLN
INTRAVENOUS | Status: AC
  Filled 2019-02-23: qty 50

## 2019-02-23 MED ORDER — FAMOTIDINE 20 MG PO TABS
ORAL_TABLET | ORAL | Status: AC
  Administered 2019-02-23: 07:00:00 20 mg via ORAL
  Filled 2019-02-23: qty 1

## 2019-02-23 MED ORDER — VASOPRESSIN 20 UNIT/ML IV SOLN
INTRAVENOUS | Status: DC | PRN
  Administered 2019-02-23 (×5): 2 [IU] via INTRAVENOUS

## 2019-02-23 MED ORDER — OXYCODONE HCL 5 MG PO TABS: 5.0000 mg | ORAL_TABLET | Freq: Once | ORAL | Status: DC | PRN

## 2019-02-23 MED ORDER — ONDANSETRON HCL 4 MG/2ML IJ SOLN
INTRAMUSCULAR | Status: DC | PRN
  Administered 2019-02-23: 4 mg via INTRAVENOUS

## 2019-02-23 MED ORDER — BACITRACIN ZINC 500 UNIT/GM EX OINT
TOPICAL_OINTMENT | CUTANEOUS | Status: AC
  Filled 2019-02-23: qty 28.35

## 2019-02-23 MED ORDER — EPHEDRINE SULFATE 50 MG/ML IJ SOLN
INTRAMUSCULAR | Status: AC
  Filled 2019-02-23: qty 1

## 2019-02-23 SURGICAL SUPPLY — 33 items
BLADE SURG 15 STRL LF DISP TIS (BLADE) ×1 IMPLANT
BLADE SURG 15 STRL SS (BLADE) ×2
CANISTER SUCT 1200ML W/VALVE (MISCELLANEOUS) ×3 IMPLANT
CHLORAPREP W/TINT 26 (MISCELLANEOUS) ×3 IMPLANT
COVER WAND RF STERILE (DRAPES) ×3 IMPLANT
DERMABOND ADVANCED (GAUZE/BANDAGES/DRESSINGS) ×2
DERMABOND ADVANCED .7 DNX12 (GAUZE/BANDAGES/DRESSINGS) ×1 IMPLANT
DRAPE LAPAROTOMY 100X77 ABD (DRAPES) ×3 IMPLANT
DRSG TEGADERM 2-3/8X2-3/4 SM (GAUZE/BANDAGES/DRESSINGS) ×2 IMPLANT
ELECT REM PT RETURN 9FT ADLT (ELECTROSURGICAL) ×3
ELECTRODE REM PT RTRN 9FT ADLT (ELECTROSURGICAL) ×1 IMPLANT
GLOVE SURG SYN 8.0 (GLOVE) ×3 IMPLANT
GLOVE SURG SYN 8.0 PF PI (GLOVE) ×1 IMPLANT
GOWN STRL REUS W/ TWL LRG LVL3 (GOWN DISPOSABLE) ×1 IMPLANT
GOWN STRL REUS W/ TWL XL LVL3 (GOWN DISPOSABLE) ×1 IMPLANT
GOWN STRL REUS W/TWL LRG LVL3 (GOWN DISPOSABLE) ×2
GOWN STRL REUS W/TWL XL LVL3 (GOWN DISPOSABLE) ×2
KIT TURNOVER KIT A (KITS) ×3 IMPLANT
LABEL OR SOLS (LABEL) ×3 IMPLANT
MICROPUNCTURE 5FR NT-U-SST (MISCELLANEOUS) ×3
NDL HYPO 25X1 1.5 SAFETY (NEEDLE) ×1 IMPLANT
NEEDLE HYPO 25X1 1.5 SAFETY (NEEDLE) ×3 IMPLANT
NS IRRIG 500ML POUR BTL (IV SOLUTION) ×3 IMPLANT
PACK BASIN MINOR ARMC (MISCELLANEOUS) ×3 IMPLANT
SET MICROPUNCTURE 5FR NT-U-SST (MISCELLANEOUS) IMPLANT
SPONGE GAUZE 2X2 8PLY STER LF (GAUZE/BANDAGES/DRESSINGS) ×1
SPONGE GAUZE 2X2 8PLY STRL LF (GAUZE/BANDAGES/DRESSINGS) ×2 IMPLANT
SUT MNCRL+ 5-0 UNDYED PC-3 (SUTURE) ×1 IMPLANT
SUT MONOCRYL 5-0 (SUTURE) ×2
SUT VIC AB 0 CT2 27 (SUTURE) ×3 IMPLANT
SUT VIC AB 3-0 SH 27 (SUTURE) ×2
SUT VIC AB 3-0 SH 27X BRD (SUTURE) ×1 IMPLANT
SYR 10ML LL (SYRINGE) ×3 IMPLANT

## 2019-02-23 NOTE — Anesthesia Preprocedure Evaluation (Addendum)
Anesthesia Evaluation  Patient identified by MRN, date of birth, ID band Patient awake    Reviewed: Allergy & Precautions, H&P , NPO status , Patient's Chart, lab work & pertinent test results  Airway Mallampati: II  TM Distance: >3 FB     Dental  (+) Upper Dentures, Lower Dentures   Pulmonary neg pulmonary ROS, neg shortness of breath, neg COPD,           Cardiovascular hypertension, (-) angina+CHF (HFpEF)  (-) Past MI and (-) Cardiac Stents (-) dysrhythmias   Echo 10/24/17: - Left ventricle: The cavity size was normal. There was mild   concentric hypertrophy. Systolic function was normal. The   estimated ejection fraction was in the range of 60% to 65%.   Doppler parameters are consistent with abnormal left ventricular   relaxation (grade 1 diastolic dysfunction). - Mitral valve: Calcified annulus. There was mild regurgitation. - Left atrium: The atrium was mildly dilated. - Right atrium: The atrium was mildly dilated. - Pulmonary arteries: Systolic pressure was mildly increased. PA   peak pressure: 40 mm Hg (S).   Neuro/Psych neg Seizures negative neurological ROS  negative psych ROS   GI/Hepatic negative GI ROS, Neg liver ROS,   Endo/Other  diabetes  Renal/GU ESRF and DialysisRenal disease     Musculoskeletal  (+) Arthritis ,   Abdominal   Peds  Hematology negative hematology ROS (+)   Anesthesia Other Findings Past Medical History: No date: (HFpEF) heart failure with preserved ejection fraction ([place])     Comment:  a. 10/2017 Echo: EF 60-65%, Gr1 DD, mild MR, mildly dil               LA/RA. PASP 61mmHg. No date: Arthritis No date: Back pain No date: Bronchitis No date: Cataract No date: Diabetes mellitus without complication (HCC)     Comment:  no meds currently No date: Dialysis patient [facility]) No date: ESRD (end stage renal disease) ([place])     Comment:  a. 2018 - initially on HD but then transitioned to                nightly PD in 08/2017. No date: Gout No date: Hypertension No date: PAF (paroxysmal atrial fibrillation) (HCC)     Comment:  a. CHA2DS2VASc = 5-->eliquis 2.5 BID.  Past Surgical History: 09/13/2018: AV FISTULA PLACEMENT; Right     Comment:  Procedure: INSERTION OF ARTERIOVENOUS (AV) GORE-TEX               GRAFT ARM;  Surgeon: Katha Cabal, MD;  Location:               [facility] ORS;  Service: Vascular;  Laterality: Right; 06/08/2017: CAPD INSERTION; N/A     Comment:  Procedure: [place]  (CAPD) CATHETER;  Surgeon: Katha Cabal, MD;  Location: [facility] ORS;  Service: Vascular;                Laterality: N/A; No date: CATARACT EXTRACTION, BILATERAL; Bilateral 03/18/2017: DIALYSIS/PERMA CATHETER INSERTION; N/A     Comment:  Procedure: DIALYSIS/PERMA CATHETER INSERTION;  Surgeon:               Katha Cabal, MD;  Location: [place] [facility];  Service: Cardiovascular;  Laterality: N/A; 10/20/2017: DIALYSIS/PERMA CATHETER REMOVAL; N/A     Comment:  Procedure: DIALYSIS/PERMA CATHETER REMOVAL;  Surgeon:               Algernon Huxley, MD;  Location: [place] [facility];                Service: Cardiovascular;  Laterality: N/A; No date: EYE SURGERY     Reproductive/Obstetrics negative OB ROS                            Anesthesia Physical Anesthesia Plan  ASA: IV  Anesthesia Plan: General LMA   Post-op Pain Management:    Induction:   PONV Risk Score and Plan: Dexamethasone, Ondansetron, Midazolam and Treatment may vary due to age or medical condition  Airway Management Planned:   Additional Equipment:   Intra-op Plan:   Post-operative Plan:   Informed Consent: I have reviewed the patients History and Physical, chart, labs and discussed the procedure including the risks, benefits and alternatives for the proposed anesthesia with the  patient or authorized representative who has indicated his/her understanding and acceptance.     Dental Advisory Given  Plan Discussed with: Anesthesiologist and CRNA  Anesthesia Plan Comments:         Anesthesia Quick Evaluation

## 2019-02-23 NOTE — Anesthesia Post-op Follow-up Note (Signed)
Anesthesia QCDR form completed.        

## 2019-02-23 NOTE — H&P (Signed)
[facility] History & Physical Update  The patient was interviewed and re-examined.  The patient's previous History and Physical has been reviewed and is unchanged.  There is no change in the plan of care. We plan to proceed with the scheduled procedure.  Hortencia Pilar, MD  02/23/2019, 9:06 AM

## 2019-02-23 NOTE — Anesthesia Procedure Notes (Signed)
Procedure Name: LMA Insertion Date/Time: 02/23/2019 8:25 AM Performed by: Eben Burow, CRNA Pre-anesthesia Checklist: Patient identified, Emergency Drugs available, Suction available and Patient being monitored Patient Re-evaluated:Patient Re-evaluated prior to induction Oxygen Delivery Method: Circle system utilized Preoxygenation: Pre-oxygenation with 100% oxygen Induction Type: Combination inhalational/ intravenous induction Ventilation: Mask ventilation without difficulty LMA: LMA inserted LMA Size: 4.0 Number of attempts: 1 Placement Confirmation: positive ETCO2 and breath sounds checked- equal and bilateral Tube secured with: Tape Dental Injury: Teeth and Oropharynx as per pre-operative assessment

## 2019-02-23 NOTE — Anesthesia Postprocedure Evaluation (Signed)
Anesthesia Post Note  Patient: Stacy Pham  Procedure(s) Performed: REMOVAL OF A DIALYSIS CATHETER ( PD CATH) (N/A Abdomen)  Patient location during evaluation: PACU Anesthesia Type: General Level of consciousness: awake and alert Pain management: pain level controlled Vital Signs Assessment: post-procedure vital signs reviewed and stable Respiratory status: spontaneous breathing, nonlabored ventilation and respiratory function stable Cardiovascular status: blood pressure returned to baseline and stable Postop Assessment: no apparent nausea or vomiting Anesthetic complications: no     Last Vitals:  Vitals:   02/23/19 1130 02/23/19 1151  BP: 127/74 (!) 113/41  Pulse: 64 65  Resp: 16 16  Temp: (!) 36.3 C   SpO2: 100% 100%    Last Pain:  Vitals:   02/23/19 1151  TempSrc:   PainSc: 0-No pain                 Durenda Hurt

## 2019-02-23 NOTE — Op Note (Signed)
  OPERATIVE NOTE   PROCEDURE: 1. Removal of a Peritoneal Dialysis Catheter  PRE-OPERATIVE DIAGNOSIS: Complication of dialysis catheter with poor dialysis by peritoneal method,  End Stage Renal Disease  POST-OPERATIVE DIAGNOSIS: Same  SURGEON: Hortencia Pilar   ASSISTANT: Ms. Hezzie Bump  ANESTHESIA: General with Local anesthetic    ESTIMATED BLOOD LOSS: Minimal   FINDING(S): 1. Catheter intact   SPECIMEN(S):  Catheter  INDICATIONS:   Stacy Pham is a 85 y.o. female who presents with functioning right arm AV access she has not done well peritoneal dialysis and she is now undergoing removal of her PD catheter.  Risks and benefits as well as alternatives were reviewed all questions answered patient wishes to proceed.  DESCRIPTION: After obtaining full informed written consent, the patient was positioned supine. The peritoneal dialysis catheter and surrounding area is prepped and draped in a sterile fashion.   A first assistant is required in order to allow for a safe and more efficient operation.  Duties include retraction of tissues to allow for optimal exposure, assisting with suture ligation of vessels as well as maintaining a clear field of view with suction as needed.  Further duties include assisting with patient positioning during the surgery as well as wound closure.  I believe that this procedure requires a first assistant in order for it to be performed at a level in keeping with the high standards of this institution.  The cuff was localized.  After appropriate timeout is called, local anesthetic with epinephrine is infiltrated into the surrounding tissues around the cuff in the left lower quadrant. Previous incision is open with an 15 blade scalpel and the dissection was carried down to expose the cuff of the tunneled catheter.  The catheter is then freed from the surrounding attachments and adhesions. Once the catheter has been freed circumferentially a pursestring  suture of 0 Vicryl is placed within the anterior rectus sheath. The catheter is then removed from the peritoneal cavity and the pursestring suture secured. The second cuff is then localized and dissected free. The catheter is transected just distal to the cuff and subsequently removed in 2 pieces. The lower quadrant incision is then irrigated and closed in layers with Vicryl.. A 4-0 Monocryl was used close the skin. Dermabond is applied to the incision.   Antibiotic ointment and a sterile dressing is applied to the exit site. Patient tolerated procedure well and there were no complications.  COMPLICATIONS: None  CONDITION: Unchanged  Hortencia Pilar. [facility] Office: [phone]  02/23/2019,10:32 AM

## 2019-02-23 NOTE — Transfer of Care (Signed)
Immediate Anesthesia Transfer of Care Note  Patient: Stacy Pham  Procedure(s) Performed: REMOVAL OF A DIALYSIS CATHETER ( PD CATH) (N/A Abdomen)  Patient Location: PACU  Anesthesia Type:General  Level of Consciousness: awake, alert , oriented and patient cooperative  Airway & Oxygen Therapy: Patient Spontanous Breathing and Patient connected to face mask oxygen  Post-op Assessment: Report given to RN and Post -op Vital signs reviewed and stable  Post vital signs: Reviewed and stable  Last Vitals:  Vitals Value Taken Time  BP 108/52 02/23/19 0915  Temp 36.6 C 02/23/19 0915  Pulse 63 02/23/19 0916  Resp 14 02/23/19 0916  SpO2 100 % 02/23/19 0916  Vitals shown include unvalidated device data.  Last Pain:  Vitals:   02/23/19 0915  TempSrc:   PainSc: 0-No pain         Complications: No apparent anesthesia complications

## 2019-02-23 NOTE — Progress Notes (Signed)
Blood pressure 87/46 dr fitzgerald called      Dr fiztgerald in to check pt   Ephedrine 10mg  given a long with 100cc ns bolus

## 2019-02-23 NOTE — Discharge Instructions (Signed)
a.  . AMBULATORY SURGERY  DISCHARGE INSTRUCTIONS   1) The drugs that you were given will stay in your system until tomorrow so for the next 24 hours you should not:  A) Drive an automobile B) Make any legal decisions C) Drink any alcoholic beverage   2) You may resume regular meals tomorrow.  Today it is better to start with liquids and gradually work up to solid foods.  You may eat anything you prefer, but it is better to start with liquids, then soup and crackers, and gradually work up to solid foods.   3) Please notify your doctor immediately if you have any unusual bleeding, trouble breathing, redness and pain at the surgery site, drainage, fever, or pain not relieved by medication.    4) Additional Instructions:        Please contact your physician with any problems or Same Day Surgery at [phone], Monday through Friday 6 am to 4 pm, or [place] at [facility] number at [phone].

## 2019-02-27 DIAGNOSIS — N2581 Secondary hyperparathyroidism of renal origin: Secondary | ICD-10-CM | POA: Diagnosis not present

## 2019-02-27 DIAGNOSIS — Z992 Dependence on renal dialysis: Secondary | ICD-10-CM | POA: Diagnosis not present

## 2019-02-27 DIAGNOSIS — D631 Anemia in chronic kidney disease: Secondary | ICD-10-CM | POA: Diagnosis not present

## 2019-02-27 DIAGNOSIS — D509 Iron deficiency anemia, unspecified: Secondary | ICD-10-CM | POA: Diagnosis not present

## 2019-02-27 DIAGNOSIS — N186 End stage renal disease: Secondary | ICD-10-CM | POA: Diagnosis not present

## 2019-03-01 DIAGNOSIS — N186 End stage renal disease: Secondary | ICD-10-CM | POA: Diagnosis not present

## 2019-03-01 DIAGNOSIS — N2581 Secondary hyperparathyroidism of renal origin: Secondary | ICD-10-CM | POA: Diagnosis not present

## 2019-03-01 DIAGNOSIS — Z992 Dependence on renal dialysis: Secondary | ICD-10-CM | POA: Diagnosis not present

## 2019-03-01 DIAGNOSIS — D509 Iron deficiency anemia, unspecified: Secondary | ICD-10-CM | POA: Diagnosis not present

## 2019-03-01 DIAGNOSIS — D631 Anemia in chronic kidney disease: Secondary | ICD-10-CM | POA: Diagnosis not present

## 2019-03-02 ENCOUNTER — Telehealth (INDEPENDENT_AMBULATORY_CARE_PROVIDER_SITE_OTHER): Payer: Self-pay

## 2019-03-02 NOTE — Telephone Encounter (Signed)
Spoke with the patients daughter [name] regarding the scratched stitches. Per Eulogio Ditch NP she can place a dry dressing over the site to discourage scratching and to contact us if any other symptoms appear.

## 2019-03-02 NOTE — Telephone Encounter (Signed)
Patients daughter called back to ask if it would be okay to use bacitracin on the area that has been scratched. Per Eulogio Ditch NP that will be fine to do, patients daughter was given this recommendation.

## 2019-03-03 DIAGNOSIS — Z992 Dependence on renal dialysis: Secondary | ICD-10-CM | POA: Diagnosis not present

## 2019-03-03 DIAGNOSIS — D631 Anemia in chronic kidney disease: Secondary | ICD-10-CM | POA: Diagnosis not present

## 2019-03-03 DIAGNOSIS — N2581 Secondary hyperparathyroidism of renal origin: Secondary | ICD-10-CM | POA: Diagnosis not present

## 2019-03-03 DIAGNOSIS — N186 End stage renal disease: Secondary | ICD-10-CM | POA: Diagnosis not present

## 2019-03-03 DIAGNOSIS — D509 Iron deficiency anemia, unspecified: Secondary | ICD-10-CM | POA: Diagnosis not present

## 2019-03-04 DIAGNOSIS — D509 Iron deficiency anemia, unspecified: Secondary | ICD-10-CM | POA: Diagnosis not present

## 2019-03-04 DIAGNOSIS — N186 End stage renal disease: Secondary | ICD-10-CM | POA: Diagnosis not present

## 2019-03-04 DIAGNOSIS — N2581 Secondary hyperparathyroidism of renal origin: Secondary | ICD-10-CM | POA: Diagnosis not present

## 2019-03-04 DIAGNOSIS — Z992 Dependence on renal dialysis: Secondary | ICD-10-CM | POA: Diagnosis not present

## 2019-03-04 DIAGNOSIS — D631 Anemia in chronic kidney disease: Secondary | ICD-10-CM | POA: Diagnosis not present

## 2019-03-05 ENCOUNTER — Ambulatory Visit (INDEPENDENT_AMBULATORY_CARE_PROVIDER_SITE_OTHER): Payer: Medicare Other | Admitting: Podiatry

## 2019-03-05 ENCOUNTER — Other Ambulatory Visit: Payer: Self-pay

## 2019-03-05 ENCOUNTER — Encounter: Payer: Self-pay | Admitting: Podiatry

## 2019-03-05 DIAGNOSIS — M79674 Pain in right toe(s): Secondary | ICD-10-CM

## 2019-03-05 DIAGNOSIS — B351 Tinea unguium: Secondary | ICD-10-CM

## 2019-03-05 DIAGNOSIS — E1159 Type 2 diabetes mellitus with other circulatory complications: Secondary | ICD-10-CM | POA: Insufficient documentation

## 2019-03-05 DIAGNOSIS — I739 Peripheral vascular disease, unspecified: Secondary | ICD-10-CM

## 2019-03-05 DIAGNOSIS — M79675 Pain in left toe(s): Secondary | ICD-10-CM

## 2019-03-05 DIAGNOSIS — I7025 Atherosclerosis of native arteries of other extremities with ulceration: Secondary | ICD-10-CM | POA: Insufficient documentation

## 2019-03-05 NOTE — Addendum Note (Signed)
Addended by: Graceann Congress D on: 03/05/2019 02:24 PM   Modules accepted: Orders

## 2019-03-05 NOTE — Progress Notes (Signed)
This patient presents the office for examination of her long thick painful nails.  This patient states that the nails are painful walking wearing her shoes.  She was referred to this office by Dr. Edythe Lynn.  She says the doctor was concerned about the appearance of her feet.  Patient states that she is diabetic and that she has been taking dialysis three times a day.  Patient is also taking Eliquis.  Patient states that her big toe left foot has been turning black for the last 10 months.  She denies any pain or discomfort during sleep.  She presents the office today for an evaluation and treatment of her feet.  General Appearance  Alert, conversant and in no acute stress.  Vascular  Dorsalis pedis and posterior tibial  pulses are minimally/absent  palpable  bilaterally.  Capillary return diminished.  bilaterally. Temperature is diminished.  bilaterally.  Neurologic  Senn-Weinstein monofilament wire test diminished  bilaterally. Muscle power within normal limits bilaterally.  Nails Thick disfigured discolored nails with subungual debris  from hallux to fifth toes bilaterally. No evidence of bacterial infection or drainage bilaterally.  Orthopedic  No limitations of motion  feet .  No crepitus or effusions noted.  No bony pathology or digital deformities noted.  Skin  normotropic skin with no porokeratosis noted bilaterally.  No signs of infections or ulcers noted.  All toes both feet have bluish hue but her great toe left foot is blacked on lateral nail groove left hallux.  Onychomycosis  B/L  PVD.  IE.  Debride nails  B/l.  Discussed this blackened left big toe with this patient.  Told her that she needs to have vascular studies performed.  We will call vascular to make appointment for this patient.  Patient is to return to the clinic in 3 months for preventative foot care services.  Gardiner Barefoot DPM

## 2019-03-06 DIAGNOSIS — D509 Iron deficiency anemia, unspecified: Secondary | ICD-10-CM | POA: Diagnosis not present

## 2019-03-06 DIAGNOSIS — N2581 Secondary hyperparathyroidism of renal origin: Secondary | ICD-10-CM | POA: Diagnosis not present

## 2019-03-06 DIAGNOSIS — D631 Anemia in chronic kidney disease: Secondary | ICD-10-CM | POA: Diagnosis not present

## 2019-03-06 DIAGNOSIS — N186 End stage renal disease: Secondary | ICD-10-CM | POA: Diagnosis not present

## 2019-03-06 DIAGNOSIS — Z992 Dependence on renal dialysis: Secondary | ICD-10-CM | POA: Diagnosis not present

## 2019-03-08 DIAGNOSIS — Z992 Dependence on renal dialysis: Secondary | ICD-10-CM | POA: Diagnosis not present

## 2019-03-08 DIAGNOSIS — D631 Anemia in chronic kidney disease: Secondary | ICD-10-CM | POA: Diagnosis not present

## 2019-03-08 DIAGNOSIS — N2581 Secondary hyperparathyroidism of renal origin: Secondary | ICD-10-CM | POA: Diagnosis not present

## 2019-03-08 DIAGNOSIS — D509 Iron deficiency anemia, unspecified: Secondary | ICD-10-CM | POA: Diagnosis not present

## 2019-03-08 DIAGNOSIS — N186 End stage renal disease: Secondary | ICD-10-CM | POA: Diagnosis not present

## 2019-03-10 DIAGNOSIS — D631 Anemia in chronic kidney disease: Secondary | ICD-10-CM | POA: Diagnosis not present

## 2019-03-10 DIAGNOSIS — Z992 Dependence on renal dialysis: Secondary | ICD-10-CM | POA: Diagnosis not present

## 2019-03-10 DIAGNOSIS — N186 End stage renal disease: Secondary | ICD-10-CM | POA: Diagnosis not present

## 2019-03-10 DIAGNOSIS — N2581 Secondary hyperparathyroidism of renal origin: Secondary | ICD-10-CM | POA: Diagnosis not present

## 2019-03-10 DIAGNOSIS — D509 Iron deficiency anemia, unspecified: Secondary | ICD-10-CM | POA: Diagnosis not present

## 2019-03-12 ENCOUNTER — Other Ambulatory Visit: Payer: Self-pay

## 2019-03-12 ENCOUNTER — Ambulatory Visit (INDEPENDENT_AMBULATORY_CARE_PROVIDER_SITE_OTHER): Payer: Medicare Other | Admitting: Vascular Surgery

## 2019-03-12 ENCOUNTER — Encounter (INDEPENDENT_AMBULATORY_CARE_PROVIDER_SITE_OTHER): Payer: Self-pay | Admitting: Vascular Surgery

## 2019-03-12 VITALS — BP 172/75 | HR 80 | Resp 16 | Wt 175.8 lb

## 2019-03-12 DIAGNOSIS — T829XXS Unspecified complication of cardiac and vascular prosthetic device, implant and graft, sequela: Secondary | ICD-10-CM | POA: Diagnosis not present

## 2019-03-12 DIAGNOSIS — I10 Essential (primary) hypertension: Secondary | ICD-10-CM | POA: Diagnosis not present

## 2019-03-12 DIAGNOSIS — I480 Paroxysmal atrial fibrillation: Secondary | ICD-10-CM

## 2019-03-12 DIAGNOSIS — N186 End stage renal disease: Secondary | ICD-10-CM

## 2019-03-12 DIAGNOSIS — Z992 Dependence on renal dialysis: Secondary | ICD-10-CM

## 2019-03-12 DIAGNOSIS — E1122 Type 2 diabetes mellitus with diabetic chronic kidney disease: Secondary | ICD-10-CM | POA: Diagnosis not present

## 2019-03-13 DIAGNOSIS — Z992 Dependence on renal dialysis: Secondary | ICD-10-CM | POA: Diagnosis not present

## 2019-03-13 DIAGNOSIS — D509 Iron deficiency anemia, unspecified: Secondary | ICD-10-CM | POA: Diagnosis not present

## 2019-03-13 DIAGNOSIS — N186 End stage renal disease: Secondary | ICD-10-CM | POA: Diagnosis not present

## 2019-03-13 DIAGNOSIS — N2581 Secondary hyperparathyroidism of renal origin: Secondary | ICD-10-CM | POA: Diagnosis not present

## 2019-03-13 DIAGNOSIS — D631 Anemia in chronic kidney disease: Secondary | ICD-10-CM | POA: Diagnosis not present

## 2019-03-14 ENCOUNTER — Other Ambulatory Visit: Payer: Self-pay | Admitting: Family Medicine

## 2019-03-14 DIAGNOSIS — E1122 Type 2 diabetes mellitus with diabetic chronic kidney disease: Secondary | ICD-10-CM

## 2019-03-14 NOTE — Progress Notes (Signed)
MRN : [id number]  Stacy Pham is a 85 y.o. (06-04-1931) female who presents with chief complaint of  Chief Complaint  Patient presents with  . Follow-up    [facility] post perm cath removal  .  History of Present Illness:   The patient returns to the office for followup of their dialysis access. The function of the access has been stable. The patient denies increased bleeding time or increased recirculation. Patient denies difficulty with cannulation. The patient denies hand pain or other symptoms consistent with steal phenomena.  No significant arm swelling.  The patient denies redness or swelling at the access site. The patient denies fever or chills at home or while on dialysis.  PD catheter is been removed and the incisions have healed well per the patient  The patient denies amaurosis fugax or recent TIA symptoms. There are no recent neurological changes noted. The patient denies claudication symptoms or rest pain symptoms. The patient denies history of DVT, PE or superficial thrombophlebitis. The patient denies recent episodes of angina or shortness of breath.   Duplex ultrasound of the AV access shows a patent access.  The previously noted stenosis is again identified and is unchanged compared to last study.       Current Meds  Medication Sig  . acetaminophen (TYLENOL 8 HOUR ARTHRITIS PAIN) 650 MG CR tablet Take 650 mg every 8 (eight) hours as needed by mouth for pain.  [name] allopurinol (ZYLOPRIM) 100 MG tablet Take 1 tablet (100 mg total) by mouth at bedtime.  [name] amiodarone (PACERONE) 200 MG tablet Take 200 mg by mouth every morning. Take 1 tablet (200 mg) by mouth once daily   . amLODipine (NORVASC) 5 MG tablet Take 1 tablet (5 mg total) by mouth daily. (Patient taking differently: Take 5 mg by mouth every morning. )  . apixaban (ELIQUIS) 2.5 MG TABS tablet Take 1 tablet (2.5 mg total) by mouth 2 (two) times daily.  [name] atorvastatin (LIPITOR) 20 MG tablet Take 1 tablet (20 mg  total) by mouth every evening.  . blood glucose meter kit and supplies KIT Please dispense either One Touch or Bayer Contour She must have one of these machines since she is on peritoneal dialysis. Use up to four times daily as directed. (FOR ICD-9 250.00, 250.01).  . calcitRIOL (ROCALTROL) 0.25 MCG capsule Take 0.25 mcg by mouth daily.  . cinacalcet (SENSIPAR) 30 MG tablet Take 1 tablet (30 mg total) by mouth daily with supper. (Patient taking differently: Take 30 mg by mouth daily with supper. With largest meal)  . colchicine 0.6 MG tablet Take 1 tablet (0.6 mg total) by mouth daily as needed (for gout flare ups).  . ferric citrate (AURYXIA) 1 GM 210 MG(Fe) tablet Take 210 mg by mouth 3 (three) times daily with meals.  . gabapentin (NEURONTIN) 100 MG capsule Take 2 capsules (200 mg total) by mouth 3 (three) times daily.  [name] HYDROcodone-acetaminophen (NORCO) 5-325 MG tablet Take 1-2 tablets by mouth every 6 (six) hours as needed for moderate pain or severe pain.  . metoprolol tartrate (LOPRESSOR) 25 MG tablet Take 0.5 tablets (12.5 mg total) by mouth 2 (two) times daily.  . predniSONE (DELTASONE) 20 MG tablet Take 20 mg by mouth daily with breakfast. Two doses    Past Medical History:  Diagnosis Date  . (HFpEF) heart failure with preserved ejection fraction ([place])    a. 10/2017 Echo: EF 60-65%, Gr1 DD, mild MR, mildly dil LA/RA. PASP 24mHg.  . Arthritis   .  Back pain   . Bronchitis   . Cataract   . Diabetes mellitus without complication (HCC)    no meds currently  . Dialysis patient ([place])   . ESRD (end stage renal disease) ([place])    a. 2018 - initially on HD but then transitioned to nightly PD in 08/2017.  [name] Gout   . Hypertension   . PAF (paroxysmal atrial fibrillation) (HCC)    a. CHA2DS2VASc = 5-->eliquis 2.5 BID.    Past Surgical History:  Procedure Laterality Date  . AV FISTULA PLACEMENT Right 09/13/2018   Procedure: INSERTION OF ARTERIOVENOUS (AV) GORE-TEX GRAFT ARM;  Surgeon:  Katha Cabal, MD;  Location: [facility] ORS;  Service: Vascular;  Laterality: Right;  . CAPD INSERTION N/A 06/08/2017   Procedure: LAPAROSCOPIC INSERTION CONTINUOUS AMBULATORY PERITONEAL DIALYSIS  (CAPD) CATHETER;  Surgeon: Katha Cabal, MD;  Location: [facility] ORS;  Service: Vascular;  Laterality: N/A;  . CATARACT EXTRACTION, BILATERAL Bilateral   . DIALYSIS/PERMA CATHETER INSERTION N/A 03/18/2017   Procedure: DIALYSIS/PERMA CATHETER INSERTION;  Surgeon: Katha Cabal, MD;  Location: [place] [facility];  Service: Cardiovascular;  Laterality: N/A;  . DIALYSIS/PERMA CATHETER REMOVAL N/A 10/20/2017   Procedure: DIALYSIS/PERMA CATHETER REMOVAL;  Surgeon: Algernon Huxley, MD;  Location: [place] [facility];  Service: Cardiovascular;  Laterality: N/A;  . EYE SURGERY    . REMOVAL OF A DIALYSIS CATHETER N/A 02/23/2019   Procedure: REMOVAL OF A DIALYSIS CATHETER ( PD CATH);  Surgeon: Katha Cabal, MD;  Location: [facility] ORS;  Service: Vascular;  Laterality: N/A;    Social History Social History   Tobacco Use  . Smoking status: Never Smoker  . Smokeless tobacco: Never Used  Substance Use Topics  . Alcohol use: No  . Drug use: No    Family History Family History  Problem Relation Age of Onset  . Hypertension Father     No Known Allergies   REVIEW OF SYSTEMS (Negative unless checked)  Constitutional: '[]' Weight loss  '[]' Fever  '[]' Chills Cardiac: '[]' Chest pain   '[]' Chest pressure   '[]' Palpitations   '[]' Shortness of breath when laying flat   '[]' Shortness of breath with exertion. Vascular:  '[]' Pain in legs with walking   '[]' Pain in legs at rest  '[]' History of DVT   '[]' Phlebitis   '[]' Swelling in legs   '[]' Varicose veins   '[]' Non-healing ulcers Pulmonary:   '[]' Uses home oxygen   '[]' Productive cough   '[]' Hemoptysis   '[]' Wheeze  '[]' COPD   '[]' Asthma Neurologic:  '[]' Dizziness   '[]' Seizures   '[]' History of stroke   '[]' History of TIA  '[]' Aphasia   '[]' Vissual changes   '[]' Weakness or numbness in arm   '[]' Weakness or  numbness in leg Musculoskeletal:   '[]' Joint swelling   '[x]' Joint pain   '[]' Low back pain Hematologic:  '[]' Easy bruising  '[]' Easy bleeding   '[]' Hypercoagulable state   '[]' Anemic Gastrointestinal:  '[]' Diarrhea   '[]' Vomiting  '[]' Gastroesophageal reflux/heartburn   '[]' Difficulty swallowing. Genitourinary:  '[x]' Chronic kidney disease   '[]' Difficult urination  '[]' Frequent urination   '[]' Blood in urine Skin:  '[]' Rashes   '[]' Ulcers  Psychological:  '[]' History of anxiety   '[]'  History of major depression.  Physical Examination  Vitals:   03/12/19 1044  BP: (!) 172/75  Pulse: 80  Resp: 16  Weight: 175 lb 12.8 oz (79.7 kg)   Body mass index is 31.14 kg/m. Gen: WD/WN, NAD Head: [place]/AT, No temporalis wasting.  Ear/Nose/Throat: Hearing grossly intact, nares w/o erythema or drainage Eyes: PER, EOMI, sclera nonicteric.  Neck: Supple,  no large masses.   Pulmonary:  Good air movement, no audible wheezing bilaterally, no use of accessory muscles.  Cardiac: RRR, no JVD Vascular: Abdominal incisions well-healed Vessel Right Left  Radial Palpable Palpable  Gastrointestinal: Non-distended. No guarding/no peritoneal signs.  Musculoskeletal: M/S 5/5 throughout.  No deformity or atrophy.  Neurologic: CN 2-12 intact. Symmetrical.  Speech is fluent. Motor exam as listed above. Psychiatric: Judgment intact, Mood & affect appropriate for pt's clinical situation. Dermatologic: No rashes or ulcers noted.  No changes consistent with cellulitis. Lymph : No lichenification or skin changes of chronic lymphedema.  CBC Lab Results  Component Value Date   WBC 10.6 (H) 02/16/2019   HGB 16.0 (H) 02/23/2019   HCT 47.0 (H) 02/23/2019   MCV 106.9 (H) 02/16/2019   PLT 392 02/16/2019    BMET    Component Value Date/Time   NA 135 02/23/2019 0632   K 4.1 02/23/2019 0632   CL 95 (L) 02/16/2019 1459   CO2 24 02/16/2019 1459   GLUCOSE 133 (H) 02/23/2019 0632   BUN 36 (H) 02/16/2019 1459   CREATININE 7.91 (H) 02/16/2019 1459    CALCIUM 9.7 02/16/2019 1459   GFRNONAA 4 (L) 02/16/2019 1459   GFRAA 5 (L) 02/16/2019 1459   CrCl cannot be calculated (Patient's most recent lab result is older than the maximum 21 days allowed.).  COAG Lab Results  Component Value Date   INR 1.5 (H) 02/16/2019   INR 1.45 08/21/2018   INR 1.43 06/16/2018    Radiology No results found.   Assessment/Plan 1. Complication from renal dialysis device, sequela Recommend:  The patient is doing well and currently has adequate dialysis access. The patient's dialysis center is not reporting any access issues. Flow pattern is stable when compared to the prior ultrasound.  The patient should have a duplex ultrasound of the dialysis access in 6 months. The patient will follow-up with me in the office after each ultrasound    - VAS [place] (AVF, AVG); Future  2. ESRD (end stage renal disease) ([place]) Continue dialysis without interruption  3. Type 2 diabetes mellitus with chronic kidney disease on chronic dialysis, without long-term current use of insulin (HCC) Continue hypoglycemic medications as already ordered, these medications have been reviewed and there are no changes at this time.  Hgb A1C to be monitored as already arranged by primary service   4. Essential hypertension Continue antihypertensive medications as already ordered, these medications have been reviewed and there are no changes at this time.   5. Paroxysmal atrial fibrillation (HCC) Continue antiarrhythmia medications as already ordered, these medications have been reviewed and there are no changes at this time.  Continue anticoagulation as ordered by Cardiology Service     Hortencia Pilar, MD  03/14/2019 10:44 AM

## 2019-03-15 DIAGNOSIS — Z992 Dependence on renal dialysis: Secondary | ICD-10-CM | POA: Diagnosis not present

## 2019-03-15 DIAGNOSIS — D509 Iron deficiency anemia, unspecified: Secondary | ICD-10-CM | POA: Diagnosis not present

## 2019-03-15 DIAGNOSIS — N186 End stage renal disease: Secondary | ICD-10-CM | POA: Diagnosis not present

## 2019-03-15 DIAGNOSIS — N2581 Secondary hyperparathyroidism of renal origin: Secondary | ICD-10-CM | POA: Diagnosis not present

## 2019-03-15 DIAGNOSIS — D631 Anemia in chronic kidney disease: Secondary | ICD-10-CM | POA: Diagnosis not present

## 2019-03-17 DIAGNOSIS — D631 Anemia in chronic kidney disease: Secondary | ICD-10-CM | POA: Diagnosis not present

## 2019-03-17 DIAGNOSIS — N2581 Secondary hyperparathyroidism of renal origin: Secondary | ICD-10-CM | POA: Diagnosis not present

## 2019-03-17 DIAGNOSIS — N186 End stage renal disease: Secondary | ICD-10-CM | POA: Diagnosis not present

## 2019-03-17 DIAGNOSIS — Z992 Dependence on renal dialysis: Secondary | ICD-10-CM | POA: Diagnosis not present

## 2019-03-17 DIAGNOSIS — D509 Iron deficiency anemia, unspecified: Secondary | ICD-10-CM | POA: Diagnosis not present

## 2019-03-20 DIAGNOSIS — D509 Iron deficiency anemia, unspecified: Secondary | ICD-10-CM | POA: Diagnosis not present

## 2019-03-20 DIAGNOSIS — D631 Anemia in chronic kidney disease: Secondary | ICD-10-CM | POA: Diagnosis not present

## 2019-03-20 DIAGNOSIS — N2581 Secondary hyperparathyroidism of renal origin: Secondary | ICD-10-CM | POA: Diagnosis not present

## 2019-03-20 DIAGNOSIS — Z992 Dependence on renal dialysis: Secondary | ICD-10-CM | POA: Diagnosis not present

## 2019-03-20 DIAGNOSIS — N186 End stage renal disease: Secondary | ICD-10-CM | POA: Diagnosis not present

## 2019-03-22 DIAGNOSIS — Z992 Dependence on renal dialysis: Secondary | ICD-10-CM | POA: Diagnosis not present

## 2019-03-22 DIAGNOSIS — N186 End stage renal disease: Secondary | ICD-10-CM | POA: Diagnosis not present

## 2019-03-22 DIAGNOSIS — D631 Anemia in chronic kidney disease: Secondary | ICD-10-CM | POA: Diagnosis not present

## 2019-03-22 DIAGNOSIS — N2581 Secondary hyperparathyroidism of renal origin: Secondary | ICD-10-CM | POA: Diagnosis not present

## 2019-03-22 DIAGNOSIS — D509 Iron deficiency anemia, unspecified: Secondary | ICD-10-CM | POA: Diagnosis not present

## 2019-03-24 DIAGNOSIS — N2581 Secondary hyperparathyroidism of renal origin: Secondary | ICD-10-CM | POA: Diagnosis not present

## 2019-03-24 DIAGNOSIS — Z992 Dependence on renal dialysis: Secondary | ICD-10-CM | POA: Diagnosis not present

## 2019-03-24 DIAGNOSIS — N186 End stage renal disease: Secondary | ICD-10-CM | POA: Diagnosis not present

## 2019-03-24 DIAGNOSIS — D509 Iron deficiency anemia, unspecified: Secondary | ICD-10-CM | POA: Diagnosis not present

## 2019-03-24 DIAGNOSIS — D631 Anemia in chronic kidney disease: Secondary | ICD-10-CM | POA: Diagnosis not present

## 2019-03-26 ENCOUNTER — Ambulatory Visit (INDEPENDENT_AMBULATORY_CARE_PROVIDER_SITE_OTHER): Payer: Medicare Other | Admitting: Family Medicine

## 2019-03-26 ENCOUNTER — Other Ambulatory Visit: Payer: Self-pay

## 2019-03-26 ENCOUNTER — Encounter: Payer: Self-pay | Admitting: Family Medicine

## 2019-03-26 VITALS — BP 138/62 | HR 69 | Temp 97.1°F | Ht 63.0 in | Wt 175.8 lb

## 2019-03-26 DIAGNOSIS — N186 End stage renal disease: Secondary | ICD-10-CM | POA: Diagnosis not present

## 2019-03-26 DIAGNOSIS — Z992 Dependence on renal dialysis: Secondary | ICD-10-CM | POA: Diagnosis not present

## 2019-03-26 DIAGNOSIS — E1122 Type 2 diabetes mellitus with diabetic chronic kidney disease: Secondary | ICD-10-CM

## 2019-03-26 DIAGNOSIS — M79672 Pain in left foot: Secondary | ICD-10-CM

## 2019-03-26 DIAGNOSIS — M79671 Pain in right foot: Secondary | ICD-10-CM

## 2019-03-26 DIAGNOSIS — Z2821 Immunization not carried out because of patient refusal: Secondary | ICD-10-CM | POA: Diagnosis not present

## 2019-03-26 LAB — POCT GLYCOSYLATED HEMOGLOBIN (HGB A1C): Hemoglobin A1C: 5.8 % — AB (ref 4.0–5.6)

## 2019-03-26 NOTE — Progress Notes (Signed)
Subjective:    Patient ID: Stacy Pham, female    DOB: 1931-03-27, 85 y.o.   MRN: [id number]  HPI Chief Complaint  Patient presents with  . Follow-up    Pt having foot problems - to be seen by Foot specialist, no call yet to schedule. Pt feels her stability has worsened - not walking as well as she used to.    This is an 85 yo female who presents today for regular follow up.   Foot problems- she recently saw podiatrist, Dr. Prudence Davidson, repeating referral for her to see vascular.  She is already established with vascular and has had appointment with them (related to dialysis access) since seeing podiatry.  I am not sure if there is some confusion about the referral and additional request to see her for this specific complaint with her feet.  Diabetes mellitus type 2- due for hemoglobin A1c check today.  She has been checking her blood sugars at home and reports they have all been under 200.  ESRD on dialysis- she attends dialysis on Tuesday Thursday and Saturdays.  There was some discussion prior about moving her to a dialysis center closer to home so that she could have medical transportation.  Currently, her daughter calls [employer] for medical transportation including to transport her doctor appointments.  Home health- when she was seen in July, she had a near fall coming into the building.  Her daughter was contacted and we discussed having home health come out and evaluate the home for safety as well as any equipment needs.  At that time the daughter agreed.  The referral has since been closed and according to the referrals coordinator, the family refused services.  They stated concern over pandemic.  The patient reports that she has had no further falls.  Past Medical History:  Diagnosis Date  . (HFpEF) heart failure with preserved ejection fraction ([place])    a. 10/2017 Echo: EF 60-65%, Gr1 DD, mild MR, mildly dil LA/RA. PASP 36mmHg.  . Arthritis   . Back pain   . Bronchitis   . Cataract   .  Diabetes mellitus without complication (HCC)    no meds currently  . Dialysis patient ([place])   . ESRD (end stage renal disease) ([place])    a. 2018 - initially on HD but then transitioned to nightly PD in 08/2017.  [name] Gout   . Hypertension   . PAF (paroxysmal atrial fibrillation) (HCC)    a. CHA2DS2VASc = 5-->eliquis 2.5 BID.   Past Surgical History:  Procedure Laterality Date  . AV FISTULA PLACEMENT Right 09/13/2018   Procedure: INSERTION OF ARTERIOVENOUS (AV) GORE-TEX GRAFT ARM;  Surgeon: Katha Cabal, MD;  Location: [facility] ORS;  Service: Vascular;  Laterality: Right;  . CAPD INSERTION N/A 06/08/2017   Procedure: LAPAROSCOPIC INSERTION CONTINUOUS AMBULATORY PERITONEAL DIALYSIS  (CAPD) CATHETER;  Surgeon: Katha Cabal, MD;  Location: [facility] ORS;  Service: Vascular;  Laterality: N/A;  . CATARACT EXTRACTION, BILATERAL Bilateral   . DIALYSIS/PERMA CATHETER INSERTION N/A 03/18/2017   Procedure: DIALYSIS/PERMA CATHETER INSERTION;  Surgeon: Katha Cabal, MD;  Location: [place] [facility];  Service: Cardiovascular;  Laterality: N/A;  . DIALYSIS/PERMA CATHETER REMOVAL N/A 10/20/2017   Procedure: DIALYSIS/PERMA CATHETER REMOVAL;  Surgeon: Algernon Huxley, MD;  Location: [place] [facility];  Service: Cardiovascular;  Laterality: N/A;  . EYE SURGERY    . REMOVAL OF A DIALYSIS CATHETER N/A 02/23/2019   Procedure: REMOVAL OF A DIALYSIS CATHETER ( PD CATH);  Surgeon: Katha Cabal, MD;  Location: [facility] ORS;  Service: Vascular;  Laterality: N/A;   Family History  Problem Relation Age of Onset  . Hypertension Father    Social History   Tobacco Use  . Smoking status: Never Smoker  . Smokeless tobacco: Never Used  Substance Use Topics  . Alcohol use: No  . Drug use: No      Review of Systems She denies chest pain, shortness of breath, abdominal pain, diarrhea, constipation she does have occasional foot pain.    Objective:   Physical Exam Vitals signs reviewed.  Constitutional:       General: She is not in acute distress.    Appearance: Normal appearance. She is obese. She is not ill-appearing, toxic-appearing or diaphoretic.  HENT:     Head: Normocephalic and atraumatic.     Right Ear: External ear normal.     Left Ear: External ear normal.  Eyes:     Conjunctiva/sclera: Conjunctivae normal.  Cardiovascular:     Rate and Rhythm: Normal rate and regular rhythm.     Heart sounds: Normal heart sounds.  Pulmonary:     Effort: Pulmonary effort is normal.     Breath sounds: Normal breath sounds.  Musculoskeletal:     Right lower leg: No edema.     Left lower leg: No edema.  Skin:    General: Skin is warm and dry.  Neurological:     Mental Status: She is alert and oriented to person, place, and time.  Psychiatric:        Mood and Affect: Mood normal.        Behavior: Behavior normal.        Thought Content: Thought content normal.        Judgment: Judgment normal.       BP 138/62 (BP Location: Left Arm, Patient Position: Sitting, Cuff Size: Normal)   Pulse 69   Temp (!) 97.1 F (36.2 C) (Temporal)   Ht 5\' 3"  (1.6 m)   Wt 175 lb 12.8 oz (79.7 kg)   SpO2 98%   BMI 31.14 kg/m  Wt Readings from Last 3 Encounters:  03/26/19 175 lb 12.8 oz (79.7 kg)  03/12/19 175 lb 12.8 oz (79.7 kg)  02/16/19 177 lb 11.1 oz (80.6 kg)   BP Readings from Last 3 Encounters:  03/26/19 138/62  03/12/19 (!) 172/75  02/23/19 (!) 113/41       Assessment & Plan:  1. Type 2 diabetes mellitus with chronic kidney disease on chronic dialysis, without long-term current use of insulin (HCC) - HgB A1c  2. Influenza vaccination declined by patient  3. Bilateral foot pain -Recently seen by podiatry and referred to vascular.  She had appointment with vascular but feet were not addressed.  I have sent podiatrist a message and see if he wants further imaging or consultation.  -Follow-up in 6 months as scheduled for annual exam/Medicare annual wellness visit   Clarene Reamer,  FNP-BC  [facility]  03/26/2019 11:38 AM

## 2019-03-26 NOTE — Patient Instructions (Signed)
Good to see you today, I will get in touch with Dr. Bjorn Loser about your vascular appointment

## 2019-03-27 DIAGNOSIS — Z992 Dependence on renal dialysis: Secondary | ICD-10-CM | POA: Diagnosis not present

## 2019-03-27 DIAGNOSIS — D509 Iron deficiency anemia, unspecified: Secondary | ICD-10-CM | POA: Diagnosis not present

## 2019-03-27 DIAGNOSIS — N186 End stage renal disease: Secondary | ICD-10-CM | POA: Diagnosis not present

## 2019-03-27 DIAGNOSIS — D631 Anemia in chronic kidney disease: Secondary | ICD-10-CM | POA: Diagnosis not present

## 2019-03-27 DIAGNOSIS — N2581 Secondary hyperparathyroidism of renal origin: Secondary | ICD-10-CM | POA: Diagnosis not present

## 2019-03-29 DIAGNOSIS — Z992 Dependence on renal dialysis: Secondary | ICD-10-CM | POA: Diagnosis not present

## 2019-03-29 DIAGNOSIS — N186 End stage renal disease: Secondary | ICD-10-CM | POA: Diagnosis not present

## 2019-03-29 DIAGNOSIS — N2581 Secondary hyperparathyroidism of renal origin: Secondary | ICD-10-CM | POA: Diagnosis not present

## 2019-03-29 DIAGNOSIS — D631 Anemia in chronic kidney disease: Secondary | ICD-10-CM | POA: Diagnosis not present

## 2019-03-29 DIAGNOSIS — D509 Iron deficiency anemia, unspecified: Secondary | ICD-10-CM | POA: Diagnosis not present

## 2019-03-31 DIAGNOSIS — N2581 Secondary hyperparathyroidism of renal origin: Secondary | ICD-10-CM | POA: Diagnosis not present

## 2019-03-31 DIAGNOSIS — D509 Iron deficiency anemia, unspecified: Secondary | ICD-10-CM | POA: Diagnosis not present

## 2019-03-31 DIAGNOSIS — N186 End stage renal disease: Secondary | ICD-10-CM | POA: Diagnosis not present

## 2019-03-31 DIAGNOSIS — D631 Anemia in chronic kidney disease: Secondary | ICD-10-CM | POA: Diagnosis not present

## 2019-03-31 DIAGNOSIS — Z992 Dependence on renal dialysis: Secondary | ICD-10-CM | POA: Diagnosis not present

## 2019-04-03 DIAGNOSIS — N186 End stage renal disease: Secondary | ICD-10-CM | POA: Diagnosis not present

## 2019-04-03 DIAGNOSIS — D631 Anemia in chronic kidney disease: Secondary | ICD-10-CM | POA: Diagnosis not present

## 2019-04-03 DIAGNOSIS — Z992 Dependence on renal dialysis: Secondary | ICD-10-CM | POA: Diagnosis not present

## 2019-04-03 DIAGNOSIS — N2581 Secondary hyperparathyroidism of renal origin: Secondary | ICD-10-CM | POA: Diagnosis not present

## 2019-04-03 DIAGNOSIS — D509 Iron deficiency anemia, unspecified: Secondary | ICD-10-CM | POA: Diagnosis not present

## 2019-04-05 DIAGNOSIS — N186 End stage renal disease: Secondary | ICD-10-CM | POA: Diagnosis not present

## 2019-04-05 DIAGNOSIS — D509 Iron deficiency anemia, unspecified: Secondary | ICD-10-CM | POA: Diagnosis not present

## 2019-04-05 DIAGNOSIS — Z992 Dependence on renal dialysis: Secondary | ICD-10-CM | POA: Diagnosis not present

## 2019-04-05 DIAGNOSIS — N2581 Secondary hyperparathyroidism of renal origin: Secondary | ICD-10-CM | POA: Diagnosis not present

## 2019-04-05 DIAGNOSIS — D631 Anemia in chronic kidney disease: Secondary | ICD-10-CM | POA: Diagnosis not present

## 2019-04-07 DIAGNOSIS — N186 End stage renal disease: Secondary | ICD-10-CM | POA: Diagnosis not present

## 2019-04-07 DIAGNOSIS — Z992 Dependence on renal dialysis: Secondary | ICD-10-CM | POA: Diagnosis not present

## 2019-04-07 DIAGNOSIS — D631 Anemia in chronic kidney disease: Secondary | ICD-10-CM | POA: Diagnosis not present

## 2019-04-07 DIAGNOSIS — D509 Iron deficiency anemia, unspecified: Secondary | ICD-10-CM | POA: Diagnosis not present

## 2019-04-07 DIAGNOSIS — N2581 Secondary hyperparathyroidism of renal origin: Secondary | ICD-10-CM | POA: Diagnosis not present

## 2019-04-10 DIAGNOSIS — N186 End stage renal disease: Secondary | ICD-10-CM | POA: Diagnosis not present

## 2019-04-10 DIAGNOSIS — Z992 Dependence on renal dialysis: Secondary | ICD-10-CM | POA: Diagnosis not present

## 2019-04-10 DIAGNOSIS — D509 Iron deficiency anemia, unspecified: Secondary | ICD-10-CM | POA: Diagnosis not present

## 2019-04-10 DIAGNOSIS — N2581 Secondary hyperparathyroidism of renal origin: Secondary | ICD-10-CM | POA: Diagnosis not present

## 2019-04-10 DIAGNOSIS — D631 Anemia in chronic kidney disease: Secondary | ICD-10-CM | POA: Diagnosis not present

## 2019-04-11 DIAGNOSIS — N186 End stage renal disease: Secondary | ICD-10-CM | POA: Diagnosis not present

## 2019-04-11 DIAGNOSIS — Z992 Dependence on renal dialysis: Secondary | ICD-10-CM | POA: Diagnosis not present

## 2019-04-12 DIAGNOSIS — N186 End stage renal disease: Secondary | ICD-10-CM | POA: Diagnosis not present

## 2019-04-12 DIAGNOSIS — Z992 Dependence on renal dialysis: Secondary | ICD-10-CM | POA: Diagnosis not present

## 2019-04-12 DIAGNOSIS — N2581 Secondary hyperparathyroidism of renal origin: Secondary | ICD-10-CM | POA: Diagnosis not present

## 2019-04-12 DIAGNOSIS — D631 Anemia in chronic kidney disease: Secondary | ICD-10-CM | POA: Diagnosis not present

## 2019-04-12 DIAGNOSIS — D509 Iron deficiency anemia, unspecified: Secondary | ICD-10-CM | POA: Diagnosis not present

## 2019-04-14 DIAGNOSIS — N2581 Secondary hyperparathyroidism of renal origin: Secondary | ICD-10-CM | POA: Diagnosis not present

## 2019-04-14 DIAGNOSIS — D631 Anemia in chronic kidney disease: Secondary | ICD-10-CM | POA: Diagnosis not present

## 2019-04-14 DIAGNOSIS — N186 End stage renal disease: Secondary | ICD-10-CM | POA: Diagnosis not present

## 2019-04-14 DIAGNOSIS — Z992 Dependence on renal dialysis: Secondary | ICD-10-CM | POA: Diagnosis not present

## 2019-04-14 DIAGNOSIS — D509 Iron deficiency anemia, unspecified: Secondary | ICD-10-CM | POA: Diagnosis not present

## 2019-04-17 DIAGNOSIS — D509 Iron deficiency anemia, unspecified: Secondary | ICD-10-CM | POA: Diagnosis not present

## 2019-04-17 DIAGNOSIS — N2581 Secondary hyperparathyroidism of renal origin: Secondary | ICD-10-CM | POA: Diagnosis not present

## 2019-04-17 DIAGNOSIS — D631 Anemia in chronic kidney disease: Secondary | ICD-10-CM | POA: Diagnosis not present

## 2019-04-17 DIAGNOSIS — N186 End stage renal disease: Secondary | ICD-10-CM | POA: Diagnosis not present

## 2019-04-17 DIAGNOSIS — Z992 Dependence on renal dialysis: Secondary | ICD-10-CM | POA: Diagnosis not present

## 2019-04-19 DIAGNOSIS — N186 End stage renal disease: Secondary | ICD-10-CM | POA: Diagnosis not present

## 2019-04-19 DIAGNOSIS — Z992 Dependence on renal dialysis: Secondary | ICD-10-CM | POA: Diagnosis not present

## 2019-04-19 DIAGNOSIS — D631 Anemia in chronic kidney disease: Secondary | ICD-10-CM | POA: Diagnosis not present

## 2019-04-19 DIAGNOSIS — N2581 Secondary hyperparathyroidism of renal origin: Secondary | ICD-10-CM | POA: Diagnosis not present

## 2019-04-19 DIAGNOSIS — D509 Iron deficiency anemia, unspecified: Secondary | ICD-10-CM | POA: Diagnosis not present

## 2019-04-20 DIAGNOSIS — I871 Compression of vein: Secondary | ICD-10-CM | POA: Diagnosis not present

## 2019-04-20 DIAGNOSIS — T82858A Stenosis of vascular prosthetic devices, implants and grafts, initial encounter: Secondary | ICD-10-CM | POA: Diagnosis not present

## 2019-04-21 DIAGNOSIS — N2581 Secondary hyperparathyroidism of renal origin: Secondary | ICD-10-CM | POA: Diagnosis not present

## 2019-04-21 DIAGNOSIS — D631 Anemia in chronic kidney disease: Secondary | ICD-10-CM | POA: Diagnosis not present

## 2019-04-21 DIAGNOSIS — N186 End stage renal disease: Secondary | ICD-10-CM | POA: Diagnosis not present

## 2019-04-21 DIAGNOSIS — Z992 Dependence on renal dialysis: Secondary | ICD-10-CM | POA: Diagnosis not present

## 2019-04-21 DIAGNOSIS — D509 Iron deficiency anemia, unspecified: Secondary | ICD-10-CM | POA: Diagnosis not present

## 2019-04-24 DIAGNOSIS — N186 End stage renal disease: Secondary | ICD-10-CM | POA: Diagnosis not present

## 2019-04-24 DIAGNOSIS — D631 Anemia in chronic kidney disease: Secondary | ICD-10-CM | POA: Diagnosis not present

## 2019-04-24 DIAGNOSIS — Z992 Dependence on renal dialysis: Secondary | ICD-10-CM | POA: Diagnosis not present

## 2019-04-24 DIAGNOSIS — D509 Iron deficiency anemia, unspecified: Secondary | ICD-10-CM | POA: Diagnosis not present

## 2019-04-24 DIAGNOSIS — N2581 Secondary hyperparathyroidism of renal origin: Secondary | ICD-10-CM | POA: Diagnosis not present

## 2019-04-26 DIAGNOSIS — D509 Iron deficiency anemia, unspecified: Secondary | ICD-10-CM | POA: Diagnosis not present

## 2019-04-26 DIAGNOSIS — Z992 Dependence on renal dialysis: Secondary | ICD-10-CM | POA: Diagnosis not present

## 2019-04-26 DIAGNOSIS — N2581 Secondary hyperparathyroidism of renal origin: Secondary | ICD-10-CM | POA: Diagnosis not present

## 2019-04-26 DIAGNOSIS — D631 Anemia in chronic kidney disease: Secondary | ICD-10-CM | POA: Diagnosis not present

## 2019-04-26 DIAGNOSIS — N186 End stage renal disease: Secondary | ICD-10-CM | POA: Diagnosis not present

## 2019-04-28 DIAGNOSIS — D509 Iron deficiency anemia, unspecified: Secondary | ICD-10-CM | POA: Diagnosis not present

## 2019-04-28 DIAGNOSIS — Z992 Dependence on renal dialysis: Secondary | ICD-10-CM | POA: Diagnosis not present

## 2019-04-28 DIAGNOSIS — D631 Anemia in chronic kidney disease: Secondary | ICD-10-CM | POA: Diagnosis not present

## 2019-04-28 DIAGNOSIS — N2581 Secondary hyperparathyroidism of renal origin: Secondary | ICD-10-CM | POA: Diagnosis not present

## 2019-04-28 DIAGNOSIS — N186 End stage renal disease: Secondary | ICD-10-CM | POA: Diagnosis not present

## 2019-04-30 DIAGNOSIS — Z992 Dependence on renal dialysis: Secondary | ICD-10-CM | POA: Diagnosis not present

## 2019-04-30 DIAGNOSIS — D631 Anemia in chronic kidney disease: Secondary | ICD-10-CM | POA: Diagnosis not present

## 2019-04-30 DIAGNOSIS — N2581 Secondary hyperparathyroidism of renal origin: Secondary | ICD-10-CM | POA: Diagnosis not present

## 2019-04-30 DIAGNOSIS — N186 End stage renal disease: Secondary | ICD-10-CM | POA: Diagnosis not present

## 2019-04-30 DIAGNOSIS — D509 Iron deficiency anemia, unspecified: Secondary | ICD-10-CM | POA: Diagnosis not present

## 2019-05-01 DIAGNOSIS — N2581 Secondary hyperparathyroidism of renal origin: Secondary | ICD-10-CM | POA: Diagnosis not present

## 2019-05-01 DIAGNOSIS — D509 Iron deficiency anemia, unspecified: Secondary | ICD-10-CM | POA: Diagnosis not present

## 2019-05-01 DIAGNOSIS — D631 Anemia in chronic kidney disease: Secondary | ICD-10-CM | POA: Diagnosis not present

## 2019-05-01 DIAGNOSIS — Z992 Dependence on renal dialysis: Secondary | ICD-10-CM | POA: Diagnosis not present

## 2019-05-01 DIAGNOSIS — N186 End stage renal disease: Secondary | ICD-10-CM | POA: Diagnosis not present

## 2019-05-03 DIAGNOSIS — Z992 Dependence on renal dialysis: Secondary | ICD-10-CM | POA: Diagnosis not present

## 2019-05-03 DIAGNOSIS — D631 Anemia in chronic kidney disease: Secondary | ICD-10-CM | POA: Diagnosis not present

## 2019-05-03 DIAGNOSIS — D509 Iron deficiency anemia, unspecified: Secondary | ICD-10-CM | POA: Diagnosis not present

## 2019-05-03 DIAGNOSIS — N2581 Secondary hyperparathyroidism of renal origin: Secondary | ICD-10-CM | POA: Diagnosis not present

## 2019-05-03 DIAGNOSIS — N186 End stage renal disease: Secondary | ICD-10-CM | POA: Diagnosis not present

## 2019-05-05 DIAGNOSIS — D631 Anemia in chronic kidney disease: Secondary | ICD-10-CM | POA: Diagnosis not present

## 2019-05-05 DIAGNOSIS — D509 Iron deficiency anemia, unspecified: Secondary | ICD-10-CM | POA: Diagnosis not present

## 2019-05-05 DIAGNOSIS — N2581 Secondary hyperparathyroidism of renal origin: Secondary | ICD-10-CM | POA: Diagnosis not present

## 2019-05-05 DIAGNOSIS — Z992 Dependence on renal dialysis: Secondary | ICD-10-CM | POA: Diagnosis not present

## 2019-05-05 DIAGNOSIS — N186 End stage renal disease: Secondary | ICD-10-CM | POA: Diagnosis not present

## 2019-05-08 DIAGNOSIS — Z992 Dependence on renal dialysis: Secondary | ICD-10-CM | POA: Diagnosis not present

## 2019-05-08 DIAGNOSIS — D631 Anemia in chronic kidney disease: Secondary | ICD-10-CM | POA: Diagnosis not present

## 2019-05-08 DIAGNOSIS — D509 Iron deficiency anemia, unspecified: Secondary | ICD-10-CM | POA: Diagnosis not present

## 2019-05-08 DIAGNOSIS — N186 End stage renal disease: Secondary | ICD-10-CM | POA: Diagnosis not present

## 2019-05-08 DIAGNOSIS — E119 Type 2 diabetes mellitus without complications: Secondary | ICD-10-CM | POA: Diagnosis not present

## 2019-05-08 DIAGNOSIS — N2581 Secondary hyperparathyroidism of renal origin: Secondary | ICD-10-CM | POA: Diagnosis not present

## 2019-05-10 DIAGNOSIS — D631 Anemia in chronic kidney disease: Secondary | ICD-10-CM | POA: Diagnosis not present

## 2019-05-10 DIAGNOSIS — Z992 Dependence on renal dialysis: Secondary | ICD-10-CM | POA: Diagnosis not present

## 2019-05-10 DIAGNOSIS — N186 End stage renal disease: Secondary | ICD-10-CM | POA: Diagnosis not present

## 2019-05-10 DIAGNOSIS — N2581 Secondary hyperparathyroidism of renal origin: Secondary | ICD-10-CM | POA: Diagnosis not present

## 2019-05-10 DIAGNOSIS — D509 Iron deficiency anemia, unspecified: Secondary | ICD-10-CM | POA: Diagnosis not present

## 2019-05-12 DIAGNOSIS — Z992 Dependence on renal dialysis: Secondary | ICD-10-CM | POA: Diagnosis not present

## 2019-05-12 DIAGNOSIS — N186 End stage renal disease: Secondary | ICD-10-CM | POA: Diagnosis not present

## 2019-05-12 DIAGNOSIS — D509 Iron deficiency anemia, unspecified: Secondary | ICD-10-CM | POA: Diagnosis not present

## 2019-05-12 DIAGNOSIS — D631 Anemia in chronic kidney disease: Secondary | ICD-10-CM | POA: Diagnosis not present

## 2019-05-12 DIAGNOSIS — N2581 Secondary hyperparathyroidism of renal origin: Secondary | ICD-10-CM | POA: Diagnosis not present

## 2019-05-13 DIAGNOSIS — Z23 Encounter for immunization: Secondary | ICD-10-CM | POA: Diagnosis not present

## 2019-05-13 DIAGNOSIS — D631 Anemia in chronic kidney disease: Secondary | ICD-10-CM | POA: Diagnosis not present

## 2019-05-13 DIAGNOSIS — D509 Iron deficiency anemia, unspecified: Secondary | ICD-10-CM | POA: Diagnosis not present

## 2019-05-13 DIAGNOSIS — N2581 Secondary hyperparathyroidism of renal origin: Secondary | ICD-10-CM | POA: Diagnosis not present

## 2019-05-13 DIAGNOSIS — N186 End stage renal disease: Secondary | ICD-10-CM | POA: Diagnosis not present

## 2019-05-13 DIAGNOSIS — Z992 Dependence on renal dialysis: Secondary | ICD-10-CM | POA: Diagnosis not present

## 2019-05-15 DIAGNOSIS — D509 Iron deficiency anemia, unspecified: Secondary | ICD-10-CM | POA: Diagnosis not present

## 2019-05-15 DIAGNOSIS — Z992 Dependence on renal dialysis: Secondary | ICD-10-CM | POA: Diagnosis not present

## 2019-05-15 DIAGNOSIS — N186 End stage renal disease: Secondary | ICD-10-CM | POA: Diagnosis not present

## 2019-05-15 DIAGNOSIS — N2581 Secondary hyperparathyroidism of renal origin: Secondary | ICD-10-CM | POA: Diagnosis not present

## 2019-05-15 DIAGNOSIS — Z23 Encounter for immunization: Secondary | ICD-10-CM | POA: Diagnosis not present

## 2019-05-15 DIAGNOSIS — D631 Anemia in chronic kidney disease: Secondary | ICD-10-CM | POA: Diagnosis not present

## 2019-05-17 DIAGNOSIS — N2581 Secondary hyperparathyroidism of renal origin: Secondary | ICD-10-CM | POA: Diagnosis not present

## 2019-05-17 DIAGNOSIS — D631 Anemia in chronic kidney disease: Secondary | ICD-10-CM | POA: Diagnosis not present

## 2019-05-17 DIAGNOSIS — Z992 Dependence on renal dialysis: Secondary | ICD-10-CM | POA: Diagnosis not present

## 2019-05-17 DIAGNOSIS — N186 End stage renal disease: Secondary | ICD-10-CM | POA: Diagnosis not present

## 2019-05-17 DIAGNOSIS — Z23 Encounter for immunization: Secondary | ICD-10-CM | POA: Diagnosis not present

## 2019-05-17 DIAGNOSIS — D509 Iron deficiency anemia, unspecified: Secondary | ICD-10-CM | POA: Diagnosis not present

## 2019-05-18 DIAGNOSIS — I871 Compression of vein: Secondary | ICD-10-CM | POA: Diagnosis not present

## 2019-05-19 DIAGNOSIS — Z992 Dependence on renal dialysis: Secondary | ICD-10-CM | POA: Diagnosis not present

## 2019-05-19 DIAGNOSIS — N2581 Secondary hyperparathyroidism of renal origin: Secondary | ICD-10-CM | POA: Diagnosis not present

## 2019-05-19 DIAGNOSIS — Z23 Encounter for immunization: Secondary | ICD-10-CM | POA: Diagnosis not present

## 2019-05-19 DIAGNOSIS — D509 Iron deficiency anemia, unspecified: Secondary | ICD-10-CM | POA: Diagnosis not present

## 2019-05-19 DIAGNOSIS — D631 Anemia in chronic kidney disease: Secondary | ICD-10-CM | POA: Diagnosis not present

## 2019-05-19 DIAGNOSIS — N186 End stage renal disease: Secondary | ICD-10-CM | POA: Diagnosis not present

## 2019-05-22 DIAGNOSIS — D509 Iron deficiency anemia, unspecified: Secondary | ICD-10-CM | POA: Diagnosis not present

## 2019-05-22 DIAGNOSIS — Z992 Dependence on renal dialysis: Secondary | ICD-10-CM | POA: Diagnosis not present

## 2019-05-22 DIAGNOSIS — N186 End stage renal disease: Secondary | ICD-10-CM | POA: Diagnosis not present

## 2019-05-22 DIAGNOSIS — Z23 Encounter for immunization: Secondary | ICD-10-CM | POA: Diagnosis not present

## 2019-05-22 DIAGNOSIS — N2581 Secondary hyperparathyroidism of renal origin: Secondary | ICD-10-CM | POA: Diagnosis not present

## 2019-05-22 DIAGNOSIS — D631 Anemia in chronic kidney disease: Secondary | ICD-10-CM | POA: Diagnosis not present

## 2019-05-24 DIAGNOSIS — N186 End stage renal disease: Secondary | ICD-10-CM | POA: Diagnosis not present

## 2019-05-24 DIAGNOSIS — D631 Anemia in chronic kidney disease: Secondary | ICD-10-CM | POA: Diagnosis not present

## 2019-05-24 DIAGNOSIS — N2581 Secondary hyperparathyroidism of renal origin: Secondary | ICD-10-CM | POA: Diagnosis not present

## 2019-05-24 DIAGNOSIS — Z23 Encounter for immunization: Secondary | ICD-10-CM | POA: Diagnosis not present

## 2019-05-24 DIAGNOSIS — Z992 Dependence on renal dialysis: Secondary | ICD-10-CM | POA: Diagnosis not present

## 2019-05-24 DIAGNOSIS — D509 Iron deficiency anemia, unspecified: Secondary | ICD-10-CM | POA: Diagnosis not present

## 2019-05-26 DIAGNOSIS — N186 End stage renal disease: Secondary | ICD-10-CM | POA: Diagnosis not present

## 2019-05-26 DIAGNOSIS — Z23 Encounter for immunization: Secondary | ICD-10-CM | POA: Diagnosis not present

## 2019-05-26 DIAGNOSIS — N2581 Secondary hyperparathyroidism of renal origin: Secondary | ICD-10-CM | POA: Diagnosis not present

## 2019-05-26 DIAGNOSIS — D631 Anemia in chronic kidney disease: Secondary | ICD-10-CM | POA: Diagnosis not present

## 2019-05-26 DIAGNOSIS — D509 Iron deficiency anemia, unspecified: Secondary | ICD-10-CM | POA: Diagnosis not present

## 2019-05-26 DIAGNOSIS — Z992 Dependence on renal dialysis: Secondary | ICD-10-CM | POA: Diagnosis not present

## 2019-05-29 DIAGNOSIS — D509 Iron deficiency anemia, unspecified: Secondary | ICD-10-CM | POA: Diagnosis not present

## 2019-05-29 DIAGNOSIS — N2581 Secondary hyperparathyroidism of renal origin: Secondary | ICD-10-CM | POA: Diagnosis not present

## 2019-05-29 DIAGNOSIS — N186 End stage renal disease: Secondary | ICD-10-CM | POA: Diagnosis not present

## 2019-05-29 DIAGNOSIS — Z992 Dependence on renal dialysis: Secondary | ICD-10-CM | POA: Diagnosis not present

## 2019-05-29 DIAGNOSIS — Z23 Encounter for immunization: Secondary | ICD-10-CM | POA: Diagnosis not present

## 2019-05-29 DIAGNOSIS — D631 Anemia in chronic kidney disease: Secondary | ICD-10-CM | POA: Diagnosis not present

## 2019-05-30 DIAGNOSIS — D509 Iron deficiency anemia, unspecified: Secondary | ICD-10-CM | POA: Diagnosis not present

## 2019-05-30 DIAGNOSIS — N2581 Secondary hyperparathyroidism of renal origin: Secondary | ICD-10-CM | POA: Diagnosis not present

## 2019-05-30 DIAGNOSIS — Z992 Dependence on renal dialysis: Secondary | ICD-10-CM | POA: Diagnosis not present

## 2019-05-30 DIAGNOSIS — N186 End stage renal disease: Secondary | ICD-10-CM | POA: Diagnosis not present

## 2019-05-30 DIAGNOSIS — Z23 Encounter for immunization: Secondary | ICD-10-CM | POA: Diagnosis not present

## 2019-05-30 DIAGNOSIS — D631 Anemia in chronic kidney disease: Secondary | ICD-10-CM | POA: Diagnosis not present

## 2019-05-31 DIAGNOSIS — Z23 Encounter for immunization: Secondary | ICD-10-CM | POA: Diagnosis not present

## 2019-05-31 DIAGNOSIS — D631 Anemia in chronic kidney disease: Secondary | ICD-10-CM | POA: Diagnosis not present

## 2019-05-31 DIAGNOSIS — D509 Iron deficiency anemia, unspecified: Secondary | ICD-10-CM | POA: Diagnosis not present

## 2019-05-31 DIAGNOSIS — Z992 Dependence on renal dialysis: Secondary | ICD-10-CM | POA: Diagnosis not present

## 2019-05-31 DIAGNOSIS — N2581 Secondary hyperparathyroidism of renal origin: Secondary | ICD-10-CM | POA: Diagnosis not present

## 2019-05-31 DIAGNOSIS — N186 End stage renal disease: Secondary | ICD-10-CM | POA: Diagnosis not present

## 2019-06-02 DIAGNOSIS — Z23 Encounter for immunization: Secondary | ICD-10-CM | POA: Diagnosis not present

## 2019-06-02 DIAGNOSIS — N2581 Secondary hyperparathyroidism of renal origin: Secondary | ICD-10-CM | POA: Diagnosis not present

## 2019-06-02 DIAGNOSIS — D509 Iron deficiency anemia, unspecified: Secondary | ICD-10-CM | POA: Diagnosis not present

## 2019-06-02 DIAGNOSIS — Z992 Dependence on renal dialysis: Secondary | ICD-10-CM | POA: Diagnosis not present

## 2019-06-02 DIAGNOSIS — D631 Anemia in chronic kidney disease: Secondary | ICD-10-CM | POA: Diagnosis not present

## 2019-06-02 DIAGNOSIS — N186 End stage renal disease: Secondary | ICD-10-CM | POA: Diagnosis not present

## 2019-06-04 ENCOUNTER — Encounter: Payer: Self-pay | Admitting: Podiatry

## 2019-06-04 ENCOUNTER — Other Ambulatory Visit: Payer: Self-pay

## 2019-06-04 ENCOUNTER — Ambulatory Visit (INDEPENDENT_AMBULATORY_CARE_PROVIDER_SITE_OTHER): Payer: Medicare Other | Admitting: Podiatry

## 2019-06-04 DIAGNOSIS — B351 Tinea unguium: Secondary | ICD-10-CM | POA: Diagnosis not present

## 2019-06-04 DIAGNOSIS — M79675 Pain in left toe(s): Secondary | ICD-10-CM | POA: Diagnosis not present

## 2019-06-04 DIAGNOSIS — L97521 Non-pressure chronic ulcer of other part of left foot limited to breakdown of skin: Secondary | ICD-10-CM

## 2019-06-04 DIAGNOSIS — E1159 Type 2 diabetes mellitus with other circulatory complications: Secondary | ICD-10-CM | POA: Diagnosis not present

## 2019-06-04 DIAGNOSIS — M79674 Pain in right toe(s): Secondary | ICD-10-CM

## 2019-06-04 DIAGNOSIS — E11621 Type 2 diabetes mellitus with foot ulcer: Secondary | ICD-10-CM

## 2019-06-04 NOTE — Patient Instructions (Signed)

## 2019-06-04 NOTE — Progress Notes (Signed)
Complaint:  Visit Type: Patient returns to my office for continued preventative foot care services. Complaint: Patient states" my nails have grown long and thick and become painful to walk and wear shoes" Patient has been diagnosed with DM with vascular disease. The patient presents for preventative foot care services. Patient has also developed a painful area on the tip of her left big toe.   This is painful walking and wearing shoes.  She admits injuring this toe at the site of her pain five days ago.   Patient was also noted to have vascular disease for which she says she never was appointed or called.  She presents to the office 3 months now for continued preventative foot care services.  Patient has ESRD.  Podiatric Exam: Vascular: dorsalis pedis and posterior tibial pulses are absent  bilateral. Capillary return is diminished.  . Temperature gradient is diminished . Skin turgor WNL  Sensorium: Diminished Semmes Weinstein monofilament test. Diminished  tactile sensation bilaterally. Nail Exam: Pt has thick disfigured discolored nails with subungual debris noted bilateral entire nail hallux through fifth toenails  Orthopedic Exam: Muscle tone and strength are WNL. No limitations in general ROM. No crepitus or effusions noted. Foot type and digits show no abnormalities. Bony prominences are unremarkable. Skin: No Porokeratosis. There is granulation tissue covering a diabetic ulcer on distal aspect medial border left foot.  No pus or infected  tissue noted.    Diagnosis:  Onychomycosis, , Pain in right toe, pain in left toes  Diabetic ulcer with pvd.  Treatment & Plan Procedures and Treatment: Consent by patient was obtained for treatment procedures.   Debridement of mycotic and hypertrophic toenails, 1 through 5 bilateral and clearing of subungual debris.   Debridement of granulation tissue distal aspect medial border left hallux.  Ulcer measures about 2 mm. X 2  Mm.  No infection.   Return  Visit-Office Procedure: Patient instructed to return to the office for a follow up visit 10 weeks  for continued evaluation and treatment. Patient to have a vascular appointment scheduled for her vascular exam.  Her ulcer may be non-healing due to circulation.  Neosporin/DSD.  Home soak instructions.  She should follow up for ulcer with her vascular doctor.  Call this office if problem worsens.    Gardiner Barefoot DPM

## 2019-06-05 DIAGNOSIS — N186 End stage renal disease: Secondary | ICD-10-CM | POA: Diagnosis not present

## 2019-06-05 DIAGNOSIS — D509 Iron deficiency anemia, unspecified: Secondary | ICD-10-CM | POA: Diagnosis not present

## 2019-06-05 DIAGNOSIS — Z992 Dependence on renal dialysis: Secondary | ICD-10-CM | POA: Diagnosis not present

## 2019-06-05 DIAGNOSIS — Z23 Encounter for immunization: Secondary | ICD-10-CM | POA: Diagnosis not present

## 2019-06-05 DIAGNOSIS — D631 Anemia in chronic kidney disease: Secondary | ICD-10-CM | POA: Diagnosis not present

## 2019-06-05 DIAGNOSIS — N2581 Secondary hyperparathyroidism of renal origin: Secondary | ICD-10-CM | POA: Diagnosis not present

## 2019-06-06 DIAGNOSIS — N186 End stage renal disease: Secondary | ICD-10-CM | POA: Diagnosis not present

## 2019-06-06 DIAGNOSIS — Z992 Dependence on renal dialysis: Secondary | ICD-10-CM | POA: Diagnosis not present

## 2019-06-06 DIAGNOSIS — D509 Iron deficiency anemia, unspecified: Secondary | ICD-10-CM | POA: Diagnosis not present

## 2019-06-06 DIAGNOSIS — Z23 Encounter for immunization: Secondary | ICD-10-CM | POA: Diagnosis not present

## 2019-06-06 DIAGNOSIS — N2581 Secondary hyperparathyroidism of renal origin: Secondary | ICD-10-CM | POA: Diagnosis not present

## 2019-06-06 DIAGNOSIS — D631 Anemia in chronic kidney disease: Secondary | ICD-10-CM | POA: Diagnosis not present

## 2019-06-09 DIAGNOSIS — D631 Anemia in chronic kidney disease: Secondary | ICD-10-CM | POA: Diagnosis not present

## 2019-06-09 DIAGNOSIS — D509 Iron deficiency anemia, unspecified: Secondary | ICD-10-CM | POA: Diagnosis not present

## 2019-06-09 DIAGNOSIS — Z23 Encounter for immunization: Secondary | ICD-10-CM | POA: Diagnosis not present

## 2019-06-09 DIAGNOSIS — N2581 Secondary hyperparathyroidism of renal origin: Secondary | ICD-10-CM | POA: Diagnosis not present

## 2019-06-09 DIAGNOSIS — N186 End stage renal disease: Secondary | ICD-10-CM | POA: Diagnosis not present

## 2019-06-09 DIAGNOSIS — Z992 Dependence on renal dialysis: Secondary | ICD-10-CM | POA: Diagnosis not present

## 2019-06-11 DIAGNOSIS — Z992 Dependence on renal dialysis: Secondary | ICD-10-CM | POA: Diagnosis not present

## 2019-06-11 DIAGNOSIS — N186 End stage renal disease: Secondary | ICD-10-CM | POA: Diagnosis not present

## 2019-06-12 DIAGNOSIS — D509 Iron deficiency anemia, unspecified: Secondary | ICD-10-CM | POA: Diagnosis not present

## 2019-06-12 DIAGNOSIS — N186 End stage renal disease: Secondary | ICD-10-CM | POA: Diagnosis not present

## 2019-06-12 DIAGNOSIS — D631 Anemia in chronic kidney disease: Secondary | ICD-10-CM | POA: Diagnosis not present

## 2019-06-12 DIAGNOSIS — Z992 Dependence on renal dialysis: Secondary | ICD-10-CM | POA: Diagnosis not present

## 2019-06-14 DIAGNOSIS — D509 Iron deficiency anemia, unspecified: Secondary | ICD-10-CM | POA: Diagnosis not present

## 2019-06-14 DIAGNOSIS — N186 End stage renal disease: Secondary | ICD-10-CM | POA: Diagnosis not present

## 2019-06-14 DIAGNOSIS — D631 Anemia in chronic kidney disease: Secondary | ICD-10-CM | POA: Diagnosis not present

## 2019-06-14 DIAGNOSIS — Z992 Dependence on renal dialysis: Secondary | ICD-10-CM | POA: Diagnosis not present

## 2019-06-16 DIAGNOSIS — N186 End stage renal disease: Secondary | ICD-10-CM | POA: Diagnosis not present

## 2019-06-16 DIAGNOSIS — Z992 Dependence on renal dialysis: Secondary | ICD-10-CM | POA: Diagnosis not present

## 2019-06-16 DIAGNOSIS — D631 Anemia in chronic kidney disease: Secondary | ICD-10-CM | POA: Diagnosis not present

## 2019-06-16 DIAGNOSIS — D509 Iron deficiency anemia, unspecified: Secondary | ICD-10-CM | POA: Diagnosis not present

## 2019-06-19 DIAGNOSIS — D509 Iron deficiency anemia, unspecified: Secondary | ICD-10-CM | POA: Diagnosis not present

## 2019-06-19 DIAGNOSIS — Z992 Dependence on renal dialysis: Secondary | ICD-10-CM | POA: Diagnosis not present

## 2019-06-19 DIAGNOSIS — N186 End stage renal disease: Secondary | ICD-10-CM | POA: Diagnosis not present

## 2019-06-19 DIAGNOSIS — D631 Anemia in chronic kidney disease: Secondary | ICD-10-CM | POA: Diagnosis not present

## 2019-06-21 ENCOUNTER — Ambulatory Visit (INDEPENDENT_AMBULATORY_CARE_PROVIDER_SITE_OTHER): Payer: Medicare Other | Admitting: Vascular Surgery

## 2019-06-21 ENCOUNTER — Encounter (INDEPENDENT_AMBULATORY_CARE_PROVIDER_SITE_OTHER): Payer: Medicare Other

## 2019-06-21 DIAGNOSIS — D509 Iron deficiency anemia, unspecified: Secondary | ICD-10-CM | POA: Diagnosis not present

## 2019-06-21 DIAGNOSIS — D631 Anemia in chronic kidney disease: Secondary | ICD-10-CM | POA: Diagnosis not present

## 2019-06-21 DIAGNOSIS — Z992 Dependence on renal dialysis: Secondary | ICD-10-CM | POA: Diagnosis not present

## 2019-06-21 DIAGNOSIS — N186 End stage renal disease: Secondary | ICD-10-CM | POA: Diagnosis not present

## 2019-06-23 DIAGNOSIS — D631 Anemia in chronic kidney disease: Secondary | ICD-10-CM | POA: Diagnosis not present

## 2019-06-23 DIAGNOSIS — D509 Iron deficiency anemia, unspecified: Secondary | ICD-10-CM | POA: Diagnosis not present

## 2019-06-23 DIAGNOSIS — Z992 Dependence on renal dialysis: Secondary | ICD-10-CM | POA: Diagnosis not present

## 2019-06-23 DIAGNOSIS — N186 End stage renal disease: Secondary | ICD-10-CM | POA: Diagnosis not present

## 2019-06-28 ENCOUNTER — Other Ambulatory Visit: Payer: Self-pay

## 2019-06-28 ENCOUNTER — Emergency Department
Admission: EM | Admit: 2019-06-28 | Discharge: 2019-06-28 | Disposition: A | Discharge status: Home / Self Care | Payer: Medicare Other | Attending: Emergency Medicine | Admitting: Emergency Medicine

## 2019-06-28 ENCOUNTER — Encounter: Payer: Self-pay | Admitting: Emergency Medicine

## 2019-06-28 ENCOUNTER — Emergency Department: Payer: Medicare Other

## 2019-06-28 DIAGNOSIS — Y939 Activity, unspecified: Secondary | ICD-10-CM | POA: Diagnosis not present

## 2019-06-28 DIAGNOSIS — Z992 Dependence on renal dialysis: Secondary | ICD-10-CM | POA: Insufficient documentation

## 2019-06-28 DIAGNOSIS — Z7901 Long term (current) use of anticoagulants: Secondary | ICD-10-CM | POA: Diagnosis not present

## 2019-06-28 DIAGNOSIS — I132 Hypertensive heart and chronic kidney disease with heart failure and with stage 5 chronic kidney disease, or end stage renal disease: Secondary | ICD-10-CM | POA: Insufficient documentation

## 2019-06-28 DIAGNOSIS — E875 Hyperkalemia: Secondary | ICD-10-CM | POA: Insufficient documentation

## 2019-06-28 DIAGNOSIS — Y998 Other external cause status: Secondary | ICD-10-CM | POA: Diagnosis not present

## 2019-06-28 DIAGNOSIS — D631 Anemia in chronic kidney disease: Secondary | ICD-10-CM | POA: Diagnosis not present

## 2019-06-28 DIAGNOSIS — I509 Heart failure, unspecified: Secondary | ICD-10-CM | POA: Insufficient documentation

## 2019-06-28 DIAGNOSIS — N186 End stage renal disease: Secondary | ICD-10-CM | POA: Insufficient documentation

## 2019-06-28 DIAGNOSIS — E1122 Type 2 diabetes mellitus with diabetic chronic kidney disease: Secondary | ICD-10-CM | POA: Insufficient documentation

## 2019-06-28 DIAGNOSIS — Y929 Unspecified place or not applicable: Secondary | ICD-10-CM | POA: Insufficient documentation

## 2019-06-28 DIAGNOSIS — S8991XA Unspecified injury of right lower leg, initial encounter: Secondary | ICD-10-CM | POA: Diagnosis present

## 2019-06-28 DIAGNOSIS — Z79899 Other long term (current) drug therapy: Secondary | ICD-10-CM | POA: Diagnosis not present

## 2019-06-28 DIAGNOSIS — S8001XA Contusion of right knee, initial encounter: Secondary | ICD-10-CM | POA: Diagnosis not present

## 2019-06-28 DIAGNOSIS — W19XXXA Unspecified fall, initial encounter: Secondary | ICD-10-CM | POA: Diagnosis not present

## 2019-06-28 DIAGNOSIS — D509 Iron deficiency anemia, unspecified: Secondary | ICD-10-CM | POA: Diagnosis not present

## 2019-06-28 LAB — COMPREHENSIVE METABOLIC PANEL
ALT: 15 U/L (ref 0–44)
AST: 17 U/L (ref 15–41)
Albumin: 4 g/dL (ref 3.5–5.0)
Alkaline Phosphatase: 95 U/L (ref 38–126)
Anion gap: 18 — ABNORMAL HIGH (ref 5–15)
BUN: 76 mg/dL — ABNORMAL HIGH (ref 8–23)
CO2: 23 mmol/L (ref 22–32)
Calcium: 9.4 mg/dL (ref 8.9–10.3)
Chloride: 99 mmol/L (ref 98–111)
Creatinine, Ser: 14.09 mg/dL — ABNORMAL HIGH (ref 0.44–1.00)
GFR calc Af Amer: 2 mL/min — ABNORMAL LOW (ref >60)
GFR calc non Af Amer: 2 mL/min — ABNORMAL LOW (ref >60)
Glucose, Bld: 105 mg/dL — ABNORMAL HIGH (ref 70–99)
Potassium: 6.7 mmol/L (ref 3.5–5.1)
Sodium: 140 mmol/L (ref 135–145)
Total Bilirubin: 0.6 mg/dL (ref 0.3–1.2)
Total Protein: 7.8 g/dL (ref 6.5–8.1)

## 2019-06-28 LAB — CBC
HCT: 30.3 % — ABNORMAL LOW (ref 36.0–46.0)
Hemoglobin: 9.9 g/dL — ABNORMAL LOW (ref 12.0–15.0)
MCH: 32.8 pg (ref 26.0–34.0)
MCHC: 32.7 g/dL (ref 30.0–36.0)
MCV: 100.3 fL — ABNORMAL HIGH (ref 80.0–100.0)
Platelets: 284 10*3/uL (ref 150–400)
RBC: 3.02 MIL/uL — ABNORMAL LOW (ref 3.87–5.11)
RDW: 13.5 % (ref 11.5–15.5)
WBC: 7.5 10*3/uL (ref 4.0–10.5)
nRBC: 0 % (ref 0.0–0.2)

## 2019-06-28 MED ORDER — SODIUM ZIRCONIUM CYCLOSILICATE 10 G PO PACK
10.0000 g | PACK | Freq: Once | ORAL | Status: AC
  Administered 2019-06-28: 10 g via ORAL
  Filled 2019-06-28: qty 1

## 2019-06-28 NOTE — ED Triage Notes (Signed)
Pt states that she missed dialysis Tuesday and today

## 2019-06-28 NOTE — ED Notes (Signed)
Helen from lab called to report pts potassium level is 6.7 MD notified

## 2019-06-28 NOTE — ED Notes (Signed)
Patient presents c/o fall last week with right knee pain.  Reports this morning was going to dialysis and knee gave out.  Did not go to dialysis

## 2019-06-28 NOTE — ED Provider Notes (Signed)
[facility] Emergency Department Provider Note   ____________________________________________    I have reviewed the triage vital signs and the nursing notes.   HISTORY  Chief Complaint Knee Pain     HPI Stacy Pham is a 85 y.o. female with a history of end-stage renal disease who presents with complaints of right knee pain.  Patient reports she fell nearly a week ago and since then has had problems with her right knee "giving out ".  She reports the pain has significantly improved.  She notes that she has missed dialysis on Tuesday because of this knee and also has missed dialysis this morning because she decided to come to the emergency department to have this evaluated.  No shortness of breath.  Past Medical History:  Diagnosis Date  . (HFpEF) heart failure with preserved ejection fraction ([place])    a. 10/2017 Echo: EF 60-65%, Gr1 DD, mild MR, mildly dil LA/RA. PASP 66mHg.  . Arthritis   . Back pain   . Bronchitis   . Cataract   . Diabetes mellitus without complication (HCC)    no meds currently  . [place]ialysis patient ([place]   . ESRD (end stage renal disease) (HB[place] Head    a. 2018 - initially on HD but then transitioned to nightly PD in 08/2017.  .Marland KitchenGout   . Hypertension   . PAF (paroxysmal atrial fibrillation) (HCC)    a. CHA2DS2VASc = 5-->eliquis 2.5 BID.    Patient Active Problem List   Diagnosis Date Noted  . Diabetic foot ulcer (HLake [place] 06/04/2019  . Pain due to onychomycosis of toenails of both feet 03/05/2019  . PVD (peripheral vascular disease) (HSaranac Lake 03/05/2019  . Type 2 diabetes mellitus with vascular disease (H[place] 03/05/2019  . Hyperkalemia 01/18/2019  . Complication from renal dialysis device 12/20/2018  . Hyperlipidemia 05/03/2018  . Chronic heart failure with preserved ejection fraction (HH[place] 12/28/2017  . 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 ESRD (end stage renal disease) (HTaz[place] 08/25/2017  . Paroxysmal atrial  fibrillation (HBuck[place] 05/21/2017  . Hypertension 05/21/2017  . Type 2 diabetes mellitus with chronic kidney disease on chronic dialysis, without long-term current use of insulin (HSiss[place] 05/21/2017  . Stage 5 chronic kidney disease on chronic dialysis (HGray 05/21/2017  . Acute hyperkalemia 04/27/2017    Past Surgical History:  Procedure Laterality Date  . AV FISTULA PLACEMENT Right 09/13/2018   Procedure: INSERTION OF ARTERIOVENOUS (AV) GORE-TEX GRAFT ARM;  Surgeon: SKatha Cabal MD;  Location: [facility] ORS;  Service: Vascular;  Laterality: Right;  . CAPD INSERTION N/A 06/08/2017   Procedure: LAPAROSCOPIC INSERTION CONTINUOUS AMBULATORY PERITONEAL DIALYSIS  (CAPD) CATHETER;  Surgeon: SKatha Cabal MD;  Location: [facility] ORS;  Service: Vascular;  Laterality: N/A;  . CATARACT EXTRACTION, BILATERAL Bilateral   . DIALYSIS/PERMA CATHETER INSERTION N/A 03/18/2017   Procedure: DIALYSIS/PERMA CATHETER INSERTION;  Surgeon: SKatha Cabal MD;  [place] [facility];  Service: Cardiovascular;  Laterality: N/A;  . DIALYSIS/PERMA CATHETER REMOVAL N/A 10/20/2017   Procedure: DIALYSIS/PERMA CATHETER REMOVAL;  Surgeon: DAlgernon Huxley MD;  [place] [place];  Service: Cardiovascular;  Laterality: N/A;  . EYE SURGERY    . REMOVAL OF A DIALYSIS CATHETER N/A 02/23/2019   Procedure: REMOVAL OF A DIALYSIS CATHETER ( PD CATH);  Surgeon: SKatha Cabal MD;  Location: [facility] ORS;  Service: Vascular;  Laterality: N/A;    Prior to Admission medications   Medication Sig Start Date End Date Taking?  Authorizing Provider  acetaminophen (TYLENOL 8 HOUR ARTHRITIS PAIN) 650 MG CR tablet Take 650 mg every 8 (eight) hours as needed by mouth for pain.    [provider]  allopurinol (ZYLOPRIM) 100 MG tablet Take 1 tablet (100 mg total) by mouth at bedtime. 09/08/18   Elby Beck, FNP  amiodarone (PACERONE) 200 MG tablet Take 200 mg by mouth every morning. Take 1 tablet (200 mg) by mouth  once daily     [provider]  amLODipine (NORVASC) 5 MG tablet Take 1 tablet (5 mg total) by mouth daily. Patient taking differently: Take 5 mg by mouth every morning.  01/22/19 04/22/19  [name], PA-C  apixaban (ELIQUIS) 2.5 MG TABS tablet Take 1 tablet (2.5 mg total) by mouth 2 (two) times daily. 09/08/18   Elby Beck, FNP  atorvastatin (LIPITOR) 20 MG tablet TAKE 1 TABLET BY MOUTH EVERY DAY IN THE EVENING 03/16/19   Elby Beck, FNP  blood glucose meter kit and supplies KIT Please dispense either One Touch or Bayer Contour She must have one of these machines since she is on peritoneal dialysis. Use up to four times daily as directed. (FOR ICD-9 250.00, 250.01). 09/23/17   Elby Beck, FNP  calcitRIOL (ROCALTROL) 0.25 MCG capsule Take 0.25 mcg by mouth daily.    [provider]  cinacalcet (SENSIPAR) 30 MG tablet Take 1 tablet (30 mg total) by mouth daily with supper. Patient taking differently: Take 30 mg by mouth daily with supper. With largest meal 04/28/17   [name], MD  colchicine 0.6 MG tablet Take 1 tablet (0.6 mg total) by mouth daily as needed (for gout flare ups). 09/08/18   Elby Beck, FNP  ferric citrate (AURYXIA) 1 GM 210 MG(Fe) tablet Take 210 mg by mouth 3 (three) times daily with meals.    [provider]  gabapentin (NEURONTIN) 100 MG capsule Take 2 capsules (200 mg total) by mouth 3 (three) times daily. 09/08/18   Elby Beck, FNP  HYDROcodone-acetaminophen (NORCO) 5-325 MG tablet Take 1-2 tablets by mouth every 6 (six) hours as needed for moderate pain or severe pain. 02/23/19   Schnier, Dolores Lory, MD  metoprolol tartrate (LOPRESSOR) 25 MG tablet Take 0.5 tablets (12.5 mg total) by mouth 2 (two) times daily. 02/05/19   Dunn, Areta Haber, PA-C  predniSONE (DELTASONE) 20 MG tablet Take 20 mg by mouth daily with breakfast. Two doses    [provider]     Allergies Patient has no known allergies.  Family  History  Problem Relation Age of Onset  . Hypertension Father     Social History Social History   Tobacco Use  . Smoking status: Never Smoker  . Smokeless tobacco: Never Used  Substance Use Topics  . Alcohol use: No  . Drug use: No    Review of Systems  Constitutional: No fevers Eyes: No visual changes.  ENT: No sore throat. Cardiovascular: Denies chest pain. Respiratory: No shortness of breath Gastrointestinal: No abdominal pain.  No nausea, no vomiting.   Genitourinary: Not applicable Musculoskeletal: As above Skin: Negative for rash. Neurological: Negative for headaches   ____________________________________________   PHYSICAL EXAM:  VITAL SIGNS: ED Triage Vitals  Enc Vitals Group     BP 06/28/19 0939 140/66     Pulse Rate 06/28/19 0939 60     Resp 06/28/19 0939 20     Temp 06/28/19 0939 98.4 F (36.9 C)     Temp Source  06/28/19 0939 Oral     SpO2 --      Weight 06/28/19 0940 79.4 kg (175 lb)     Height 06/28/19 0940 1.6 m ('5\' 3"' )     Head Circumference --      Peak Flow --      Pain Score 06/28/19 0939 3     Pain Loc --      Pain Edu? --      Excl. in [place]? --     Constitutional: Alert and oriented.   Nose: No congestion/rhinnorhea. Mouth/Throat: Mucous membranes are moist.    Cardiovascular: Normal rate, regular rhythm. Grossly normal heart sounds.  Good peripheral circulation. Respiratory: Normal respiratory effort.  No retractions. Lungs CTAB. Gastrointestinal: Soft and nontender. No distention.  No CVA tenderness.  Musculoskeletal: Right knee: Mild swelling no significant erythema, possible mild effusion superiorly.  No pain with varus or valgus stress, no pain with axial load.  Chronic appearing mild duskiness of toes 2 through 5, patient notes this is chronic Neurologic:  Normal speech and language. No gross focal neurologic deficits are appreciated.  Skin:  Skin is warm, dry and intact. No rash noted. Psychiatric: Mood and affect are normal.  Speech and behavior are normal.  ____________________________________________   LABS (all labs ordered are listed, but only abnormal results are displayed)  Labs Reviewed  CBC - Abnormal; Notable for the following components:      Result Value   RBC 3.02 (*)    Hemoglobin 9.9 (*)    HCT 30.3 (*)    MCV 100.3 (*)    All other components within normal limits  COMPREHENSIVE METABOLIC PANEL - Abnormal; Notable for the following components:   Potassium 6.7 (*)    Glucose, Bld 105 (*)    BUN 76 (*)    Creatinine, Ser 14.09 (*)    GFR calc non Af Amer 2 (*)    GFR calc Af Amer 2 (*)    Anion gap 18 (*)    All other components within normal limits   ____________________________________________  EKG  ED ECG REPORT I, Lavonia Drafts, the attending physician, personally viewed and interpreted this ECG.  Date: 06/28/2019  Rhythm: normal sinus rhythm QRS Axis: normal Intervals: normal ST/T Wave abnormalities: normal Narrative Interpretation: no evidence of acute ischemia  ____________________________________________  RADIOLOGY  Knee x-ray no fracture ____________________________________________   PROCEDURES  Procedure(s) performed: No  Procedures   Critical Care performed: No ____________________________________________   INITIAL IMPRESSION / ASSESSMENT AND PLAN / ED COURSE  Pertinent labs & imaging results that were available during my care of the patient were reviewed by me and considered in my medical decision making (see chart for details).  Patient has missed 2 dialysis sessions, potassium is elevated at 6.7.  Discussed with Dr. Zollie Scale of nephrology who recommends [facility] and outpatient dialysis if that can be achieved today.  Discussed with [facility] and they can take the patient today for dialysis.  Knee x-ray is reassuring, will outfit the patient with knee immobilizer, outpatient follow-up with Ortho as needed.  Discussed dusky toes suspicious for  chronic ischemia with Dr. Lucky Cowboy of vascular surgery who recommends outpatient follow-up    ____________________________________________   FINAL CLINICAL IMPRESSION(S) / ED DIAGNOSES  Final diagnoses:  Fall, initial encounter  Contusion of right knee, initial encounter  Hyperkalemia        Note:  This document was prepared using Dragon voice recognition software and may include unintentional dictation errors.   Lometa Riggin,  Herbie [place], MD 06/28/19 1017

## 2019-06-28 NOTE — ED Triage Notes (Signed)
Pt reports was not able to have dialysis today because her knee gave out and she fell on her right knee.

## 2019-06-29 DIAGNOSIS — D631 Anemia in chronic kidney disease: Secondary | ICD-10-CM | POA: Diagnosis not present

## 2019-06-29 DIAGNOSIS — Z992 Dependence on renal dialysis: Secondary | ICD-10-CM | POA: Diagnosis not present

## 2019-06-29 DIAGNOSIS — D509 Iron deficiency anemia, unspecified: Secondary | ICD-10-CM | POA: Diagnosis not present

## 2019-06-29 DIAGNOSIS — N186 End stage renal disease: Secondary | ICD-10-CM | POA: Diagnosis not present

## 2019-06-30 DIAGNOSIS — N186 End stage renal disease: Secondary | ICD-10-CM | POA: Diagnosis not present

## 2019-06-30 DIAGNOSIS — Z992 Dependence on renal dialysis: Secondary | ICD-10-CM | POA: Diagnosis not present

## 2019-06-30 DIAGNOSIS — D631 Anemia in chronic kidney disease: Secondary | ICD-10-CM | POA: Diagnosis not present

## 2019-06-30 DIAGNOSIS — D509 Iron deficiency anemia, unspecified: Secondary | ICD-10-CM | POA: Diagnosis not present

## 2019-07-02 DIAGNOSIS — Z992 Dependence on renal dialysis: Secondary | ICD-10-CM | POA: Diagnosis not present

## 2019-07-02 DIAGNOSIS — D509 Iron deficiency anemia, unspecified: Secondary | ICD-10-CM | POA: Diagnosis not present

## 2019-07-02 DIAGNOSIS — D631 Anemia in chronic kidney disease: Secondary | ICD-10-CM | POA: Diagnosis not present

## 2019-07-02 DIAGNOSIS — N186 End stage renal disease: Secondary | ICD-10-CM | POA: Diagnosis not present

## 2019-07-03 DIAGNOSIS — D509 Iron deficiency anemia, unspecified: Secondary | ICD-10-CM | POA: Diagnosis not present

## 2019-07-03 DIAGNOSIS — Z992 Dependence on renal dialysis: Secondary | ICD-10-CM | POA: Diagnosis not present

## 2019-07-03 DIAGNOSIS — D631 Anemia in chronic kidney disease: Secondary | ICD-10-CM | POA: Diagnosis not present

## 2019-07-03 DIAGNOSIS — N186 End stage renal disease: Secondary | ICD-10-CM | POA: Diagnosis not present

## 2019-07-05 DIAGNOSIS — D631 Anemia in chronic kidney disease: Secondary | ICD-10-CM | POA: Diagnosis not present

## 2019-07-05 DIAGNOSIS — D509 Iron deficiency anemia, unspecified: Secondary | ICD-10-CM | POA: Diagnosis not present

## 2019-07-05 DIAGNOSIS — N186 End stage renal disease: Secondary | ICD-10-CM | POA: Diagnosis not present

## 2019-07-05 DIAGNOSIS — Z992 Dependence on renal dialysis: Secondary | ICD-10-CM | POA: Diagnosis not present

## 2019-07-07 DIAGNOSIS — D631 Anemia in chronic kidney disease: Secondary | ICD-10-CM | POA: Diagnosis not present

## 2019-07-07 DIAGNOSIS — Z992 Dependence on renal dialysis: Secondary | ICD-10-CM | POA: Diagnosis not present

## 2019-07-07 DIAGNOSIS — D509 Iron deficiency anemia, unspecified: Secondary | ICD-10-CM | POA: Diagnosis not present

## 2019-07-07 DIAGNOSIS — N186 End stage renal disease: Secondary | ICD-10-CM | POA: Diagnosis not present

## 2019-07-10 ENCOUNTER — Other Ambulatory Visit: Payer: Self-pay | Admitting: Family Medicine

## 2019-07-10 DIAGNOSIS — Z992 Dependence on renal dialysis: Secondary | ICD-10-CM

## 2019-07-10 DIAGNOSIS — D509 Iron deficiency anemia, unspecified: Secondary | ICD-10-CM | POA: Diagnosis not present

## 2019-07-10 DIAGNOSIS — D631 Anemia in chronic kidney disease: Secondary | ICD-10-CM | POA: Diagnosis not present

## 2019-07-10 DIAGNOSIS — E1122 Type 2 diabetes mellitus with diabetic chronic kidney disease: Secondary | ICD-10-CM

## 2019-07-10 DIAGNOSIS — N186 End stage renal disease: Secondary | ICD-10-CM | POA: Diagnosis not present

## 2019-07-12 DIAGNOSIS — D509 Iron deficiency anemia, unspecified: Secondary | ICD-10-CM | POA: Diagnosis not present

## 2019-07-12 DIAGNOSIS — D631 Anemia in chronic kidney disease: Secondary | ICD-10-CM | POA: Diagnosis not present

## 2019-07-12 DIAGNOSIS — N186 End stage renal disease: Secondary | ICD-10-CM | POA: Diagnosis not present

## 2019-07-12 DIAGNOSIS — Z992 Dependence on renal dialysis: Secondary | ICD-10-CM | POA: Diagnosis not present

## 2019-07-13 DIAGNOSIS — D631 Anemia in chronic kidney disease: Secondary | ICD-10-CM | POA: Diagnosis not present

## 2019-07-13 DIAGNOSIS — D509 Iron deficiency anemia, unspecified: Secondary | ICD-10-CM | POA: Diagnosis not present

## 2019-07-13 DIAGNOSIS — Z992 Dependence on renal dialysis: Secondary | ICD-10-CM | POA: Diagnosis not present

## 2019-07-13 DIAGNOSIS — N2581 Secondary hyperparathyroidism of renal origin: Secondary | ICD-10-CM | POA: Diagnosis not present

## 2019-07-13 DIAGNOSIS — N186 End stage renal disease: Secondary | ICD-10-CM | POA: Diagnosis not present

## 2019-07-14 DIAGNOSIS — Z992 Dependence on renal dialysis: Secondary | ICD-10-CM | POA: Diagnosis not present

## 2019-07-14 DIAGNOSIS — D509 Iron deficiency anemia, unspecified: Secondary | ICD-10-CM | POA: Diagnosis not present

## 2019-07-14 DIAGNOSIS — D631 Anemia in chronic kidney disease: Secondary | ICD-10-CM | POA: Diagnosis not present

## 2019-07-14 DIAGNOSIS — N2581 Secondary hyperparathyroidism of renal origin: Secondary | ICD-10-CM | POA: Diagnosis not present

## 2019-07-14 DIAGNOSIS — N186 End stage renal disease: Secondary | ICD-10-CM | POA: Diagnosis not present

## 2019-07-17 DIAGNOSIS — N186 End stage renal disease: Secondary | ICD-10-CM | POA: Diagnosis not present

## 2019-07-17 DIAGNOSIS — N2581 Secondary hyperparathyroidism of renal origin: Secondary | ICD-10-CM | POA: Diagnosis not present

## 2019-07-17 DIAGNOSIS — D509 Iron deficiency anemia, unspecified: Secondary | ICD-10-CM | POA: Diagnosis not present

## 2019-07-17 DIAGNOSIS — Z992 Dependence on renal dialysis: Secondary | ICD-10-CM | POA: Diagnosis not present

## 2019-07-17 DIAGNOSIS — D631 Anemia in chronic kidney disease: Secondary | ICD-10-CM | POA: Diagnosis not present

## 2019-07-19 DIAGNOSIS — Z992 Dependence on renal dialysis: Secondary | ICD-10-CM | POA: Diagnosis not present

## 2019-07-19 DIAGNOSIS — N2581 Secondary hyperparathyroidism of renal origin: Secondary | ICD-10-CM | POA: Diagnosis not present

## 2019-07-19 DIAGNOSIS — D631 Anemia in chronic kidney disease: Secondary | ICD-10-CM | POA: Diagnosis not present

## 2019-07-19 DIAGNOSIS — N186 End stage renal disease: Secondary | ICD-10-CM | POA: Diagnosis not present

## 2019-07-19 DIAGNOSIS — D509 Iron deficiency anemia, unspecified: Secondary | ICD-10-CM | POA: Diagnosis not present

## 2019-07-24 DIAGNOSIS — Z992 Dependence on renal dialysis: Secondary | ICD-10-CM | POA: Diagnosis not present

## 2019-07-24 DIAGNOSIS — D509 Iron deficiency anemia, unspecified: Secondary | ICD-10-CM | POA: Diagnosis not present

## 2019-07-24 DIAGNOSIS — N2581 Secondary hyperparathyroidism of renal origin: Secondary | ICD-10-CM | POA: Diagnosis not present

## 2019-07-24 DIAGNOSIS — N186 End stage renal disease: Secondary | ICD-10-CM | POA: Diagnosis not present

## 2019-07-24 DIAGNOSIS — D631 Anemia in chronic kidney disease: Secondary | ICD-10-CM | POA: Diagnosis not present

## 2019-07-26 DIAGNOSIS — N186 End stage renal disease: Secondary | ICD-10-CM | POA: Diagnosis not present

## 2019-07-26 DIAGNOSIS — D631 Anemia in chronic kidney disease: Secondary | ICD-10-CM | POA: Diagnosis not present

## 2019-07-26 DIAGNOSIS — N2581 Secondary hyperparathyroidism of renal origin: Secondary | ICD-10-CM | POA: Diagnosis not present

## 2019-07-26 DIAGNOSIS — D509 Iron deficiency anemia, unspecified: Secondary | ICD-10-CM | POA: Diagnosis not present

## 2019-07-26 DIAGNOSIS — Z992 Dependence on renal dialysis: Secondary | ICD-10-CM | POA: Diagnosis not present

## 2019-07-28 DIAGNOSIS — D509 Iron deficiency anemia, unspecified: Secondary | ICD-10-CM | POA: Diagnosis not present

## 2019-07-28 DIAGNOSIS — Z992 Dependence on renal dialysis: Secondary | ICD-10-CM | POA: Diagnosis not present

## 2019-07-28 DIAGNOSIS — N2581 Secondary hyperparathyroidism of renal origin: Secondary | ICD-10-CM | POA: Diagnosis not present

## 2019-07-28 DIAGNOSIS — N186 End stage renal disease: Secondary | ICD-10-CM | POA: Diagnosis not present

## 2019-07-28 DIAGNOSIS — D631 Anemia in chronic kidney disease: Secondary | ICD-10-CM | POA: Diagnosis not present

## 2019-07-29 DIAGNOSIS — D509 Iron deficiency anemia, unspecified: Secondary | ICD-10-CM | POA: Diagnosis not present

## 2019-07-29 DIAGNOSIS — Z992 Dependence on renal dialysis: Secondary | ICD-10-CM | POA: Diagnosis not present

## 2019-07-29 DIAGNOSIS — N2581 Secondary hyperparathyroidism of renal origin: Secondary | ICD-10-CM | POA: Diagnosis not present

## 2019-07-29 DIAGNOSIS — N186 End stage renal disease: Secondary | ICD-10-CM | POA: Diagnosis not present

## 2019-07-29 DIAGNOSIS — D631 Anemia in chronic kidney disease: Secondary | ICD-10-CM | POA: Diagnosis not present

## 2019-07-31 DIAGNOSIS — N186 End stage renal disease: Secondary | ICD-10-CM | POA: Diagnosis not present

## 2019-07-31 DIAGNOSIS — D631 Anemia in chronic kidney disease: Secondary | ICD-10-CM | POA: Diagnosis not present

## 2019-07-31 DIAGNOSIS — Z992 Dependence on renal dialysis: Secondary | ICD-10-CM | POA: Diagnosis not present

## 2019-07-31 DIAGNOSIS — N2581 Secondary hyperparathyroidism of renal origin: Secondary | ICD-10-CM | POA: Diagnosis not present

## 2019-07-31 DIAGNOSIS — D509 Iron deficiency anemia, unspecified: Secondary | ICD-10-CM | POA: Diagnosis not present

## 2019-08-02 DIAGNOSIS — D509 Iron deficiency anemia, unspecified: Secondary | ICD-10-CM | POA: Diagnosis not present

## 2019-08-02 DIAGNOSIS — Z992 Dependence on renal dialysis: Secondary | ICD-10-CM | POA: Diagnosis not present

## 2019-08-02 DIAGNOSIS — D631 Anemia in chronic kidney disease: Secondary | ICD-10-CM | POA: Diagnosis not present

## 2019-08-02 DIAGNOSIS — N2581 Secondary hyperparathyroidism of renal origin: Secondary | ICD-10-CM | POA: Diagnosis not present

## 2019-08-02 DIAGNOSIS — N186 End stage renal disease: Secondary | ICD-10-CM | POA: Diagnosis not present

## 2019-08-04 DIAGNOSIS — D509 Iron deficiency anemia, unspecified: Secondary | ICD-10-CM | POA: Diagnosis not present

## 2019-08-04 DIAGNOSIS — D631 Anemia in chronic kidney disease: Secondary | ICD-10-CM | POA: Diagnosis not present

## 2019-08-04 DIAGNOSIS — Z992 Dependence on renal dialysis: Secondary | ICD-10-CM | POA: Diagnosis not present

## 2019-08-04 DIAGNOSIS — N186 End stage renal disease: Secondary | ICD-10-CM | POA: Diagnosis not present

## 2019-08-04 DIAGNOSIS — N2581 Secondary hyperparathyroidism of renal origin: Secondary | ICD-10-CM | POA: Diagnosis not present

## 2019-08-07 ENCOUNTER — Encounter: Payer: Self-pay | Admitting: Emergency Medicine

## 2019-08-07 ENCOUNTER — Other Ambulatory Visit: Payer: Self-pay

## 2019-08-07 ENCOUNTER — Emergency Department: Payer: Medicare Other

## 2019-08-07 ENCOUNTER — Emergency Department
Admission: EM | Admit: 2019-08-07 | Discharge: 2019-08-07 | Disposition: A | Discharge status: Home / Self Care | Payer: Medicare Other | Attending: Emergency Medicine | Admitting: Emergency Medicine

## 2019-08-07 DIAGNOSIS — Z7901 Long term (current) use of anticoagulants: Secondary | ICD-10-CM | POA: Diagnosis not present

## 2019-08-07 DIAGNOSIS — D631 Anemia in chronic kidney disease: Secondary | ICD-10-CM | POA: Diagnosis not present

## 2019-08-07 DIAGNOSIS — N186 End stage renal disease: Secondary | ICD-10-CM | POA: Insufficient documentation

## 2019-08-07 DIAGNOSIS — Y998 Other external cause status: Secondary | ICD-10-CM | POA: Insufficient documentation

## 2019-08-07 DIAGNOSIS — Y9389 Activity, other specified: Secondary | ICD-10-CM | POA: Insufficient documentation

## 2019-08-07 DIAGNOSIS — W228XXA Striking against or struck by other objects, initial encounter: Secondary | ICD-10-CM | POA: Insufficient documentation

## 2019-08-07 DIAGNOSIS — S91209A Unspecified open wound of unspecified toe(s) with damage to nail, initial encounter: Secondary | ICD-10-CM | POA: Insufficient documentation

## 2019-08-07 DIAGNOSIS — I509 Heart failure, unspecified: Secondary | ICD-10-CM | POA: Diagnosis not present

## 2019-08-07 DIAGNOSIS — Z79899 Other long term (current) drug therapy: Secondary | ICD-10-CM | POA: Insufficient documentation

## 2019-08-07 DIAGNOSIS — S91202A Unspecified open wound of left great toe with damage to nail, initial encounter: Secondary | ICD-10-CM | POA: Diagnosis not present

## 2019-08-07 DIAGNOSIS — I259 Chronic ischemic heart disease, unspecified: Secondary | ICD-10-CM | POA: Diagnosis not present

## 2019-08-07 DIAGNOSIS — E119 Type 2 diabetes mellitus without complications: Secondary | ICD-10-CM | POA: Diagnosis not present

## 2019-08-07 DIAGNOSIS — E1122 Type 2 diabetes mellitus with diabetic chronic kidney disease: Secondary | ICD-10-CM | POA: Insufficient documentation

## 2019-08-07 DIAGNOSIS — Z992 Dependence on renal dialysis: Secondary | ICD-10-CM | POA: Diagnosis not present

## 2019-08-07 DIAGNOSIS — Y92012 Bathroom of single-family (private) house as the place of occurrence of the external cause: Secondary | ICD-10-CM | POA: Insufficient documentation

## 2019-08-07 DIAGNOSIS — I132 Hypertensive heart and chronic kidney disease with heart failure and with stage 5 chronic kidney disease, or end stage renal disease: Secondary | ICD-10-CM | POA: Insufficient documentation

## 2019-08-07 DIAGNOSIS — S99922A Unspecified injury of left foot, initial encounter: Secondary | ICD-10-CM | POA: Diagnosis present

## 2019-08-07 DIAGNOSIS — S90112A Contusion of left great toe without damage to nail, initial encounter: Secondary | ICD-10-CM | POA: Diagnosis not present

## 2019-08-07 DIAGNOSIS — N2581 Secondary hyperparathyroidism of renal origin: Secondary | ICD-10-CM | POA: Diagnosis not present

## 2019-08-07 DIAGNOSIS — D509 Iron deficiency anemia, unspecified: Secondary | ICD-10-CM | POA: Diagnosis not present

## 2019-08-07 MED ORDER — BACITRACIN-NEOMYCIN-POLYMYXIN 400-5-5000 EX OINT
TOPICAL_OINTMENT | Freq: Once | CUTANEOUS | Status: AC
  Filled 2019-08-07: qty 1

## 2019-08-07 MED ORDER — NEOSPORIN PLUS PAIN RELIEF MS 3.5-10000-10 EX CREA: TOPICAL_CREAM | Freq: Two times a day (BID) | CUTANEOUS | 0 refills | Status: DC

## 2019-08-07 MED ORDER — TRAMADOL HCL 50 MG PO TABS
50.0000 mg | ORAL_TABLET | Freq: Once | ORAL | Status: AC
  Administered 2019-08-07: 50 mg via ORAL
  Filled 2019-08-07: qty 1

## 2019-08-07 MED ORDER — LIDOCAINE HCL (PF) 1 % IJ SOLN
5.0000 mL | Freq: Once | INTRAMUSCULAR | Status: AC
  Administered 2019-08-07: 5 mL
  Filled 2019-08-07: qty 5

## 2019-08-07 MED ORDER — TRAMADOL HCL 50 MG PO TABS: 50.0000 mg | ORAL_TABLET | Freq: Two times a day (BID) | ORAL | 0 refills | Status: DC | PRN

## 2019-08-07 NOTE — ED Notes (Signed)
Right great toe injury this AM, "hit it on the door". Reports large amount of bleeding and toenail loose. Missed dialysis this AM but is able to get there this afternoon if d/c from ED

## 2019-08-07 NOTE — ED Provider Notes (Signed)
[facility] Emergency Department Provider Note   ____________________________________________   None    (approximate)  I have reviewed the triage vital signs and the nursing notes.   HISTORY  Chief Complaint Toe Pain    HPI Stacy Pham is a 86 y.o. female patient complain left great toe pain and partial nail separation secondary to a contusion this morning.  Patient says she stubbed the toe getting out of the shower.  Patient denies loss of function or loss of sensation.  Patient rates pain as a 5/10.  Patient described pain as "achy".  Bleeding controlled prior to arrival with direct pressure.         Past Medical History:  Diagnosis Date  . (HFpEF) heart failure with preserved ejection fraction ([place])    a. 10/2017 Echo: EF 60-65%, Gr1 DD, mild MR, mildly dil LA/RA. PASP 33mHg.  . Arthritis   . Back pain   . Bronchitis   . Cataract   . Diabetes mellitus without complication (HCC)    no meds currently  . Dialysis patient (HOp[place]   . ESRD (end stage renal disease) (HCoosa    a. 2018 - initially on HD but then transitioned to nightly PD in 08/2017.  .Marland KitchenGout   . Hypertension   . PAF (paroxysmal atrial fibrillation) (HCC)    a. CHA2DS2VASc = 5-->eliquis 2.5 BID.    Patient Active Problem List   Diagnosis Date Noted  . Diabetic foot ulcer (HHanama[place] 06/04/2019  . Pain due to onychomycosis of toenails of both feet 03/05/2019  . PVD (peripheral vascular disease) (HPaddock Lake 03/05/2019  . Type 2 diabetes mellitus with vascular disease (HSardis 03/05/2019  . Hyperkalemia 01/18/2019  . Complication from renal dialysis device 12/20/2018  . Hyperlipidemia 05/03/2018  . Chronic heart failure with preserved ejection fraction (HRich[place] 12/28/2017  . 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 (HIndianola 08/25/2017  . ESRD (end stage renal disease) (HTres Pinos 08/25/2017  . Paroxysmal atrial fibrillation (HBourg 05/21/2017  . Hypertension 05/21/2017  . Type 2 diabetes mellitus with chronic  kidney disease on chronic dialysis, without long-term current use of insulin (HLock[place] 05/21/2017  . Stage 5 chronic kidney disease on chronic dialysis (HWaukes[place] 05/21/2017  . Acute hyperkalemia 04/27/2017    Past Surgical History:  Procedure Laterality Date  . AV FISTULA PLACEMENT Right 09/13/2018   Procedure: INSERTION OF ARTERIOVENOUS (AV) GORE-TEX GRAFT ARM;  Surgeon: SKatha Cabal MD;  Location: [facility] ORS;  Service: Vascular;  Laterality: Right;  . CAPD INSERTION N/A 06/08/2017   Procedure: LAPAROSCOPIC INSERTION CONTINUOUS AMBULATORY PERITONEAL DIALYSIS  (CAPD) CATHETER;  Surgeon: SKatha Cabal MD;  Location: [facility] ORS;  Service: Vascular;  Laterality: N/A;  . CATARACT EXTRACTION, BILATERAL Bilateral   . DIALYSIS/PERMA CATHETER INSERTION N/A 03/18/2017   Procedure: DIALYSIS/PERMA CATHETER INSERTION;  Surgeon: SKatha Cabal MD;  [place] [place];  Service: Cardiovascular;  Laterality: N/A;  . DIALYSIS/PERMA CATHETER REMOVAL N/A 10/20/2017   Procedure: DIALYSIS/PERMA CATHETER REMOVAL;  Surgeon: DAlgernon Huxley MD;  [place];  Service: Cardiovascular;  Laterality: N/A;  . EYE SURGERY    . REMOVAL OF A DIALYSIS CATHETER N/A 02/23/2019   Procedure: REMOVAL OF A DIALYSIS CATHETER ( PD CATH);  Surgeon: SKatha Cabal MD;  Location: [facility] ORS;  Service: Vascular;  Laterality: N/A;    Prior to Admission medications   Medication Sig Start Date End Date Taking? Authorizing Provider  acetaminophen (TYLENOL 8 HOUR ARTHRITIS PAIN) 650 MG CR tablet Take  650 mg every 8 (eight) hours as needed by mouth for pain.    [provider]  allopurinol (ZYLOPRIM) 100 MG tablet Take 1 tablet (100 mg total) by mouth at bedtime. 09/08/18   Elby Beck, FNP  amiodarone (PACERONE) 200 MG tablet Take 200 mg by mouth every morning. Take 1 tablet (200 mg) by mouth once daily     [provider]  amLODipine (NORVASC) 5 MG tablet Take 1 tablet (5 mg total)  by mouth daily. Patient taking differently: Take 5 mg by mouth every morning.  01/22/19 04/22/19  [name], PA-C  apixaban (ELIQUIS) 2.5 MG TABS tablet Take 1 tablet (2.5 mg total) by mouth 2 (two) times daily. 09/08/18   Elby Beck, FNP  atorvastatin (LIPITOR) 20 MG tablet TAKE 1 TABLET BY MOUTH EVERY DAY IN THE EVENING 07/10/19   Elby Beck, FNP  blood glucose meter kit and supplies KIT Please dispense either One Touch or Bayer Contour She must have one of these machines since she is on peritoneal dialysis. Use up to four times daily as directed. (FOR ICD-9 250.00, 250.01). 09/23/17   Elby Beck, FNP  calcitRIOL (ROCALTROL) 0.25 MCG capsule Take 0.25 mcg by mouth daily.    [provider]  cinacalcet (SENSIPAR) 30 MG tablet Take 1 tablet (30 mg total) by mouth daily with supper. Patient taking differently: Take 30 mg by mouth daily with supper. With largest meal 04/28/17   [name], MD  colchicine 0.6 MG tablet Take 1 tablet (0.6 mg total) by mouth daily as needed (for gout flare ups). 09/08/18   Elby Beck, FNP  ferric citrate (AURYXIA) 1 GM 210 MG(Fe) tablet Take 210 mg by mouth 3 (three) times daily with meals.    [provider]  gabapentin (NEURONTIN) 100 MG capsule Take 2 capsules (200 mg total) by mouth 3 (three) times daily. 09/08/18   Elby Beck, FNP  HYDROcodone-acetaminophen (NORCO) 5-325 MG tablet Take 1-2 tablets by mouth every 6 (six) hours as needed for moderate pain or severe pain. 02/23/19   Schnier, Dolores Lory, MD  metoprolol tartrate (LOPRESSOR) 25 MG tablet Take 0.5 tablets (12.5 mg total) by mouth 2 (two) times daily. 02/05/19   Dunn, Areta Haber, PA-C  neomycin-polymyxin-pramoxine (NEOSPORIN PLUS) 1 % cream Apply topically 2 (two) times daily. 08/07/19   Sable Feil, PA-C  predniSONE (DELTASONE) 20 MG tablet Take 20 mg by mouth daily with breakfast. Two doses    [provider]  traMADol (ULTRAM) 50 MG tablet Take  1 tablet (50 mg total) by mouth every 12 (twelve) hours as needed. 08/07/19 08/06/20  Sable Feil, PA-C    Allergies Patient has no known allergies.  Family History  Problem Relation Age of Onset  . Hypertension Father     Social History Social History   Tobacco Use  . Smoking status: Never Smoker  . Smokeless tobacco: Never Used  Substance Use Topics  . Alcohol use: No  . Drug use: No    Review of Systems Constitutional: No fever/chills Eyes: No visual changes. ENT: No sore throat. Cardiovascular: Denies chest pain. Respiratory: Denies shortness of breath. Gastrointestinal: No abdominal pain.  No nausea, no vomiting.  No diarrhea.  No constipation. Genitourinary: Negative for dysuria. Musculoskeletal: Left great toe pain. Skin: Negative for rash. Neurological: Negative for headaches, focal weakness or numbness. Endocrine:  Diabetes, end-stage renal disease, and hypertension.  ____________________________________________   PHYSICAL EXAM:  VITAL SIGNS: ED  Triage Vitals  Enc Vitals Group     BP 08/07/19 0951 (!) 162/43     Pulse Rate 08/07/19 0951 (!) 58     Resp 08/07/19 0951 16     Temp 08/07/19 0951 98.5 F (36.9 C)     Temp Source 08/07/19 0951 Oral     SpO2 08/07/19 0951 100 %     Weight 08/07/19 0952 175 lb (79.4 kg)     Height 08/07/19 0952 5' 3" (1.6 m)     Head Circumference --      Peak Flow --      Pain Score --      Pain Loc --      Pain Edu? --      Excl. in [place]? --     Constitutional: Alert and oriented. Well appearing and in no acute distress. Cardiovascular: Normal rate, regular rhythm. Grossly normal heart sounds.  Good peripheral circulation.  Evaded blood pressure. Respiratory: Normal respiratory effort.  No retractions. Lungs CTAB. Gastrointestinal: Soft and nontender. No distention. No abdominal bruits. No CVA tenderness. Musculoskeletal: Edema left great toe.   Neurologic:  Normal speech and language. No gross focal neurologic  deficits are appreciated. No gait instability. Skin:  Skin is warm, dry and intact. No rash noted.  Partial hypertrophic nail separation of the left great toe. Psychiatric: Mood and affect are normal. Speech and behavior are normal.  ____________________________________________   LABS (all labs ordered are listed, but only abnormal results are displayed)  Labs Reviewed - No data to display ____________________________________________  EKG   ____________________________________________  RADIOLOGY  ED MD interpretation:    Official radiology report(s): DG Toe Great Left  Result Date: 08/07/2019 CLINICAL DATA:  Contusion.  Pain. EXAM: LEFT GREAT TOE COMPARISON:  No recent. FINDINGS: Diffuse osteopenia and degenerative change. Degenerative changes most prominent about the first MTP joint. Peripheral vascular calcification. IMPRESSION: 1. Diffuse osteopenia and degenerative change. Degenerative changes most prominent about the first MTP joint. No acute bony abnormality. 2.  Peripheral vascular disease. Electronically Signed   By: Marcello Moores  Register   On: 08/07/2019 10:36    ____________________________________________   PROCEDURES  Procedure(s) performed (including Critical Care):  .Nail Removal  Date/Time: 08/07/2019 11:00 AM Performed by: Sable Feil, PA-C Authorized by: Sable Feil, PA-C   Consent:    Consent obtained:  Verbal   Consent given by:  Patient   Risks discussed:  Bleeding, infection, pain and permanent nail deformity Location:    Foot:  L big toe Pre-procedure details:    Skin preparation:  Betadine Anesthesia (see MAR for exact dosages):    Anesthesia method:  Nerve block   Block needle gauge:  25 G   Block anesthetic:  Lidocaine 1% w/o epi   Block injection procedure:  Anatomic landmarks identified, introduced needle and incremental injection   Block outcome:  Anesthesia achieved Nail Removal:    Nail removed:  Complete Ingrown nail:    Nail  matrix removed or ablated:  None Post-procedure details:    Dressing:  Antibiotic ointment and Xeroform gauze   Patient tolerance of procedure:  Tolerated well, no immediate complications     ____________________________________________   INITIAL IMPRESSION / ASSESSMENT AND PLAN / ED COURSE  As part of my medical decision making, I reviewed the following data within the [place]      Patient presents with partial nail avulsion secondary to stubbing her toe.  Discussed x-ray findings showing no fracture.  See procedure  note for toenail removal.  Patient given discharge care instruction advised to follow-up as directed.   Maral Lampe was evaluated in Emergency Department on 08/07/2019 for the symptoms described in the history of present illness. She was evaluated in the context of the global COVID-19 pandemic, which necessitated consideration that the patient might be at risk for infection with the SARS-CoV-2 virus that causes COVID-19. Institutional protocols and algorithms that pertain to the evaluation of patients at risk for COVID-19 are in a state of rapid change based on information released by regulatory bodies including the CDC and federal and state organizations. These policies and algorithms were followed during the patient's care in the ED.       ____________________________________________   FINAL CLINICAL IMPRESSION(S) / ED DIAGNOSES  Final diagnoses:  Avulsed toenail, initial encounter     ED Discharge Orders         Ordered    neomycin-polymyxin-pramoxine (NEOSPORIN PLUS) 1 % cream  2 times daily     08/07/19 1056    traMADol (ULTRAM) 50 MG tablet  Every 12 hours PRN     08/07/19 1056           Note:  This document was prepared using Dragon voice recognition software and may include unintentional dictation errors.    Sable Feil, PA-C 08/07/19 1103    Nance Pear, MD 08/07/19 1154

## 2019-08-07 NOTE — ED Triage Notes (Signed)
Pt here with c/o pain in great toe on left foot, has seen podiatrist in the past for the same toe, however, this am while getting out of the shower, hit her toe on chair, caused bleeding and pain, per pt, nail is coming off, was supposed to go to dialysis this am, however, came here instead. Pt wrapped toe with gauze, no bleeding on bandage noted. NAD.

## 2019-08-07 NOTE — Discharge Instructions (Signed)
Follow discharge care instructions take medication as directed. °

## 2019-08-07 NOTE — ED Notes (Signed)
Pt states dialysis said if she can get dc'd before noon, they will still be able to complete her dialysis today.

## 2019-08-09 DIAGNOSIS — N2581 Secondary hyperparathyroidism of renal origin: Secondary | ICD-10-CM | POA: Diagnosis not present

## 2019-08-09 DIAGNOSIS — D509 Iron deficiency anemia, unspecified: Secondary | ICD-10-CM | POA: Diagnosis not present

## 2019-08-09 DIAGNOSIS — D631 Anemia in chronic kidney disease: Secondary | ICD-10-CM | POA: Diagnosis not present

## 2019-08-09 DIAGNOSIS — Z992 Dependence on renal dialysis: Secondary | ICD-10-CM | POA: Diagnosis not present

## 2019-08-09 DIAGNOSIS — N186 End stage renal disease: Secondary | ICD-10-CM | POA: Diagnosis not present

## 2019-08-11 DIAGNOSIS — N186 End stage renal disease: Secondary | ICD-10-CM | POA: Diagnosis not present

## 2019-08-11 DIAGNOSIS — Z992 Dependence on renal dialysis: Secondary | ICD-10-CM | POA: Diagnosis not present

## 2019-08-11 DIAGNOSIS — D631 Anemia in chronic kidney disease: Secondary | ICD-10-CM | POA: Diagnosis not present

## 2019-08-11 DIAGNOSIS — N2581 Secondary hyperparathyroidism of renal origin: Secondary | ICD-10-CM | POA: Diagnosis not present

## 2019-08-11 DIAGNOSIS — D509 Iron deficiency anemia, unspecified: Secondary | ICD-10-CM | POA: Diagnosis not present

## 2019-08-12 DIAGNOSIS — N186 End stage renal disease: Secondary | ICD-10-CM | POA: Diagnosis not present

## 2019-08-12 DIAGNOSIS — Z992 Dependence on renal dialysis: Secondary | ICD-10-CM | POA: Diagnosis not present

## 2019-08-13 ENCOUNTER — Other Ambulatory Visit: Payer: Self-pay

## 2019-08-13 ENCOUNTER — Ambulatory Visit (INDEPENDENT_AMBULATORY_CARE_PROVIDER_SITE_OTHER): Payer: Medicare Other | Admitting: Podiatry

## 2019-08-13 ENCOUNTER — Encounter: Payer: Self-pay | Admitting: Podiatry

## 2019-08-13 DIAGNOSIS — M79674 Pain in right toe(s): Secondary | ICD-10-CM

## 2019-08-13 DIAGNOSIS — E1159 Type 2 diabetes mellitus with other circulatory complications: Secondary | ICD-10-CM | POA: Diagnosis not present

## 2019-08-13 DIAGNOSIS — D631 Anemia in chronic kidney disease: Secondary | ICD-10-CM | POA: Diagnosis not present

## 2019-08-13 DIAGNOSIS — L97521 Non-pressure chronic ulcer of other part of left foot limited to breakdown of skin: Secondary | ICD-10-CM

## 2019-08-13 DIAGNOSIS — B351 Tinea unguium: Secondary | ICD-10-CM

## 2019-08-13 DIAGNOSIS — D509 Iron deficiency anemia, unspecified: Secondary | ICD-10-CM | POA: Diagnosis not present

## 2019-08-13 DIAGNOSIS — E11621 Type 2 diabetes mellitus with foot ulcer: Secondary | ICD-10-CM

## 2019-08-13 DIAGNOSIS — Z992 Dependence on renal dialysis: Secondary | ICD-10-CM | POA: Diagnosis not present

## 2019-08-13 DIAGNOSIS — N2581 Secondary hyperparathyroidism of renal origin: Secondary | ICD-10-CM | POA: Diagnosis not present

## 2019-08-13 DIAGNOSIS — M79675 Pain in left toe(s): Secondary | ICD-10-CM

## 2019-08-13 DIAGNOSIS — I739 Peripheral vascular disease, unspecified: Secondary | ICD-10-CM

## 2019-08-13 DIAGNOSIS — N186 End stage renal disease: Secondary | ICD-10-CM | POA: Diagnosis not present

## 2019-08-13 NOTE — Progress Notes (Addendum)
This patient presents the office for continued preventative foot care services.  She says that the nails have grown and are  painful walking and  wearing her shoes.  Patient has a history of diabetes mellitus with vascular disease she was diagnosed as having a diabetic ulcer on the distal aspect of her left foot in November.  I recommended home soaking instructions and if this problem persists she should see her vascular doctor.  She presents the office today stating that she had pain in her big toe of her left foot and went to the emergency room.  She says that they removed the big toenail on the left foot at that time.  She says she is now has more pain in her big toe than she did prior to having the nail removed.  She was told by the doctor in the emergency room not to soak her left great toe and to keep the toe bandaged.  Patient is also on dialysis which she at attends dialysis on Tuesdays and Thursdays weekly.  She presents the office today for her preventative foot care services and for examination of the surgical site on the left great toenail.  This patient has been seen by Dr Franchot Gallo previously.  She was seen by me on 8/24 and referred to have a vascular study done.  According to the records she had an appointment with Dr.  Ronalee Belts on  December 10 which was canceled.      General Appearance  Alert, conversant and in no acute stress.  Vascular  Dorsalis pedis and posterior tibial  pulses are absent  palpable  bilaterally.  Capillary return is within normal limits  bilaterally. Temperature is diminished   bilaterally.  Neurologic  Senn-Weinstein monofilament wire test diminished.  bilaterally. Muscle power within normal limits bilaterally.  Nails Thick disfigured discolored nails with subungual debris  from hallux to fifth toes bilaterally. No evidence of bacterial infection or drainage bilaterally with the exception left great toenail.  Examination of the left hallux nail bed does reveal dryness  necrotic tissue noted at the site of the nailbed.  No evidence of any drainage or infection noted today.  Patient does have a darkened /black toe on the dorsum of left foot.  Crusty  tissue noted at proximal nail fold.    Orthopedic  No limitations of motion  feet .  No crepitus or effusions noted.  No bony pathology or digital deformities noted.  Skin  normotropic skin with no porokeratosis noted bilaterally.  No signs of infections or ulcers noted.  .  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    Debridement of nails  X 9.  Examination of the nail surgery site on her left hallux was performed.  There was dry necrotic tissue present in her toenail.  She previously had been told not to soak her toe.  There also is dark discoloration noted to the big toe left foot.  No infection was noted.  I am concerned about the toe healing and recommended she soak her toe for 4 days and return to the office to see Dr. Amalia Hailey.  He can then make determination for the future care of this toe.  If this condition worsens or becomes very painful, the patient was told to contact this office or go to the Emergency Department at the hospital.  RTC 3 months       Gardiner Barefoot DPM

## 2019-08-13 NOTE — Patient Instructions (Signed)

## 2019-08-14 DIAGNOSIS — D509 Iron deficiency anemia, unspecified: Secondary | ICD-10-CM | POA: Diagnosis not present

## 2019-08-14 DIAGNOSIS — Z992 Dependence on renal dialysis: Secondary | ICD-10-CM | POA: Diagnosis not present

## 2019-08-14 DIAGNOSIS — D631 Anemia in chronic kidney disease: Secondary | ICD-10-CM | POA: Diagnosis not present

## 2019-08-14 DIAGNOSIS — N2581 Secondary hyperparathyroidism of renal origin: Secondary | ICD-10-CM | POA: Diagnosis not present

## 2019-08-14 DIAGNOSIS — N186 End stage renal disease: Secondary | ICD-10-CM | POA: Diagnosis not present

## 2019-08-16 DIAGNOSIS — D631 Anemia in chronic kidney disease: Secondary | ICD-10-CM | POA: Diagnosis not present

## 2019-08-16 DIAGNOSIS — N2581 Secondary hyperparathyroidism of renal origin: Secondary | ICD-10-CM | POA: Diagnosis not present

## 2019-08-16 DIAGNOSIS — Z992 Dependence on renal dialysis: Secondary | ICD-10-CM | POA: Diagnosis not present

## 2019-08-16 DIAGNOSIS — D509 Iron deficiency anemia, unspecified: Secondary | ICD-10-CM | POA: Diagnosis not present

## 2019-08-16 DIAGNOSIS — N186 End stage renal disease: Secondary | ICD-10-CM | POA: Diagnosis not present

## 2019-08-17 ENCOUNTER — Ambulatory Visit (INDEPENDENT_AMBULATORY_CARE_PROVIDER_SITE_OTHER): Payer: Medicare Other | Admitting: Podiatry

## 2019-08-17 ENCOUNTER — Other Ambulatory Visit: Payer: Self-pay

## 2019-08-17 ENCOUNTER — Encounter: Payer: Self-pay | Admitting: Podiatry

## 2019-08-17 DIAGNOSIS — E0843 Diabetes mellitus due to underlying condition with diabetic autonomic (poly)neuropathy: Secondary | ICD-10-CM

## 2019-08-17 DIAGNOSIS — L97502 Non-pressure chronic ulcer of other part of unspecified foot with fat layer exposed: Secondary | ICD-10-CM

## 2019-08-18 DIAGNOSIS — N186 End stage renal disease: Secondary | ICD-10-CM | POA: Diagnosis not present

## 2019-08-18 DIAGNOSIS — D509 Iron deficiency anemia, unspecified: Secondary | ICD-10-CM | POA: Diagnosis not present

## 2019-08-18 DIAGNOSIS — Z992 Dependence on renal dialysis: Secondary | ICD-10-CM | POA: Diagnosis not present

## 2019-08-18 DIAGNOSIS — D631 Anemia in chronic kidney disease: Secondary | ICD-10-CM | POA: Diagnosis not present

## 2019-08-18 DIAGNOSIS — N2581 Secondary hyperparathyroidism of renal origin: Secondary | ICD-10-CM | POA: Diagnosis not present

## 2019-08-20 ENCOUNTER — Other Ambulatory Visit: Payer: Self-pay

## 2019-08-20 ENCOUNTER — Encounter (INDEPENDENT_AMBULATORY_CARE_PROVIDER_SITE_OTHER): Payer: Self-pay | Admitting: Vascular Surgery

## 2019-08-20 ENCOUNTER — Ambulatory Visit (INDEPENDENT_AMBULATORY_CARE_PROVIDER_SITE_OTHER): Payer: Medicare Other

## 2019-08-20 ENCOUNTER — Ambulatory Visit (INDEPENDENT_AMBULATORY_CARE_PROVIDER_SITE_OTHER): Payer: Medicare Other | Admitting: Vascular Surgery

## 2019-08-20 VITALS — BP 153/68 | HR 59 | Resp 14 | Ht 63.0 in | Wt 166.0 lb

## 2019-08-20 DIAGNOSIS — N186 End stage renal disease: Secondary | ICD-10-CM

## 2019-08-20 DIAGNOSIS — E1122 Type 2 diabetes mellitus with diabetic chronic kidney disease: Secondary | ICD-10-CM | POA: Diagnosis not present

## 2019-08-20 DIAGNOSIS — I480 Paroxysmal atrial fibrillation: Secondary | ICD-10-CM | POA: Diagnosis not present

## 2019-08-20 DIAGNOSIS — R05 Cough: Secondary | ICD-10-CM | POA: Diagnosis not present

## 2019-08-20 DIAGNOSIS — E782 Mixed hyperlipidemia: Secondary | ICD-10-CM

## 2019-08-20 DIAGNOSIS — Z992 Dependence on renal dialysis: Secondary | ICD-10-CM | POA: Diagnosis not present

## 2019-08-20 DIAGNOSIS — T829XXS Unspecified complication of cardiac and vascular prosthetic device, implant and graft, sequela: Secondary | ICD-10-CM

## 2019-08-20 DIAGNOSIS — R059 Cough, unspecified: Secondary | ICD-10-CM

## 2019-08-20 MED ORDER — BENZONATATE 100 MG PO CAPS: 100.0000 mg | ORAL_CAPSULE | Freq: Three times a day (TID) | ORAL | 1 refills | Status: DC | PRN

## 2019-08-20 NOTE — Progress Notes (Signed)
MRN : [id number]  Stacy Pham is a 86 y.o. (Nov 16, 1930) female who presents with chief complaint of No chief complaint on file. [name]  History of Present Illness:   The patient returns to the office for followup of their dialysis access. The function of the access has been stable. The patient denies increased bleeding time or increased recirculation. Patient denies difficulty with cannulation. The patient denies hand pain or other symptoms consistent with steal phenomena.  No significant arm swelling.  The patient denies redness or swelling at the access site. The patient denies fever or chills at home or while on dialysis.  She is c/o a nonproductive cough.  No fever or chills  No hemoptysis  The patient denies amaurosis fugax or recent TIA symptoms. There are no recent neurological changes noted. The patient denies claudication symptoms or rest pain symptoms. The patient denies history of DVT, PE or superficial thrombophlebitis. The patient denies recent episodes of angina or shortness of breath.   Duplex ultrasound of the AV access shows a patent access.  The previously noted stenosis is again identified and is unchanged compared to last study.   No outpatient medications have been marked as taking for the 08/20/19 encounter (Appointment) with Delana Meyer, Dolores Lory, MD.    Past Medical History:  Diagnosis Date  . (HFpEF) heart failure with preserved ejection fraction ([place])    a. 10/2017 Echo: EF 60-65%, Gr1 DD, mild MR, mildly dil LA/RA. PASP 24mmHg.  . Arthritis   . Back pain   . Bronchitis   . Cataract   . Diabetes mellitus without complication (HCC)    no meds currently  . Dialysis patient ([place])   . ESRD (end stage renal disease) ([place])    a. 2018 - initially on HD but then transitioned to nightly PD in 08/2017.  [name] Gout   . Hypertension   . PAF (paroxysmal atrial fibrillation) (HCC)    a. CHA2DS2VASc = 5-->eliquis 2.5 BID.    Past Surgical History:  Procedure Laterality  Date  . AV FISTULA PLACEMENT Right 09/13/2018   Procedure: INSERTION OF ARTERIOVENOUS (AV) GORE-TEX GRAFT ARM;  Surgeon: Katha Cabal, MD;  Location: [facility] ORS;  Service: Vascular;  Laterality: Right;  . CAPD INSERTION N/A 06/08/2017   Procedure: LAPAROSCOPIC INSERTION CONTINUOUS AMBULATORY PERITONEAL DIALYSIS  (CAPD) CATHETER;  Surgeon: Katha Cabal, MD;  Location: [facility] ORS;  Service: Vascular;  Laterality: N/A;  . CATARACT EXTRACTION, BILATERAL Bilateral   . DIALYSIS/PERMA CATHETER INSERTION N/A 03/18/2017   Procedure: DIALYSIS/PERMA CATHETER INSERTION;  Surgeon: Katha Cabal, MD;  Location: [place] [facility];  Service: Cardiovascular;  Laterality: N/A;  . DIALYSIS/PERMA CATHETER REMOVAL N/A 10/20/2017   Procedure: DIALYSIS/PERMA CATHETER REMOVAL;  Surgeon: Algernon Huxley, MD;  Location: [facility];  Service: Cardiovascular;  Laterality: N/A;  . EYE SURGERY    . REMOVAL OF A DIALYSIS CATHETER N/A 02/23/2019   Procedure: REMOVAL OF A DIALYSIS CATHETER ( PD CATH);  Surgeon: Katha Cabal, MD;  Location: [facility];  Service: Vascular;  Laterality: N/A;    Social History Social History   Tobacco Use  . Smoking status: Never Smoker  . Smokeless tobacco: Never Used  Substance Use Topics  . Alcohol use: No  . Drug use: No    Family History Family History  Problem Relation Age of Onset  . Hypertension Father     No Known Allergies   REVIEW OF SYSTEMS (Negative unless checked)  Constitutional: [] Weight loss  []   Fever  [] Chills Cardiac: [] Chest pain   [] Chest pressure   [] Palpitations   [] Shortness of breath when laying flat   [] Shortness of breath with exertion. Vascular:  [] Pain in legs with walking   [] Pain in legs at rest  [] History of DVT   [] Phlebitis   [] Swelling in legs   [] Varicose veins   [] Non-healing ulcers Pulmonary:   [] Uses home oxygen   [x] Non-Productive cough   [] Hemoptysis   [] Wheeze  [] COPD   [] Asthma Neurologic:  [] Dizziness   [] Seizures    [] History of stroke   [] History of TIA  [] Aphasia   [] Vissual changes   [] Weakness or numbness in arm   [] Weakness or numbness in leg Musculoskeletal:   [] Joint swelling   [] Joint pain   [] Low back pain Hematologic:  [] Easy bruising  [] Easy bleeding   [] Hypercoagulable state   [] Anemic Gastrointestinal:  [] Diarrhea   [] Vomiting  [] Gastroesophageal reflux/heartburn   [] Difficulty swallowing. Genitourinary:  [] Chronic kidney disease   [] Difficult urination  [] Frequent urination   [] Blood in urine Skin:  [] Rashes   [] Ulcers  Psychological:  [] History of anxiety   []  History of major depression.  Physical Examination  There were no vitals filed for this visit. There is no height or weight on file to calculate BMI. Gen: WD/WN, NAD Head: [place]/AT, No temporalis wasting.  Ear/Nose/Throat: Hearing grossly intact, nares w/o erythema or drainage Eyes: PER, EOMI, sclera nonicteric.  Neck: Supple, no large masses.   Pulmonary:  Good air movement, no audible wheezing bilaterally, no use of accessory muscles.  Cardiac: RRR, no JVD Vascular:  Right brachial axillary graft good thrill good bruit Vessel Right Left  Radial Trace Palpable Palpable  Brachial Palpable Palpable  Gastrointestinal: Non-distended. No guarding/no peritoneal signs.  Musculoskeletal: M/S 5/5 throughout.  No deformity or atrophy.  Neurologic: CN 2-12 intact. Symmetrical.  Speech is fluent. Motor exam as listed above. Psychiatric: Judgment intact, Mood & affect appropriate for pt's clinical situation. Dermatologic: No rashes or ulcers noted.  No changes consistent with cellulitis. Lymph : No lichenification or skin changes of chronic lymphedema.  CBC Lab Results  Component Value Date   WBC 7.5 06/28/2019   HGB 9.9 (L) 06/28/2019   HCT 30.3 (L) 06/28/2019   MCV 100.3 (H) 06/28/2019   PLT 284 06/28/2019    BMET    Component Value Date/Time   NA 140 06/28/2019 0941   K 6.7 (HH) 06/28/2019 0941   CL 99 06/28/2019 0941   CO2  23 06/28/2019 0941   GLUCOSE 105 (H) 06/28/2019 0941   BUN 76 (H) 06/28/2019 0941   CREATININE 14.09 (H) 06/28/2019 0941   CALCIUM 9.4 06/28/2019 0941   GFRNONAA 2 (L) 06/28/2019 0941   GFRAA 2 (L) 06/28/2019 0941   CrCl cannot be calculated (Patient's most recent lab result is older than the maximum 21 days allowed.).  COAG Lab Results  Component Value Date   INR 1.5 (H) 02/16/2019   INR 1.45 08/21/2018   INR 1.43 06/16/2018    Radiology DG Toe Great Left  Result Date: 08/07/2019 CLINICAL DATA:  Contusion.  Pain. EXAM: LEFT GREAT TOE COMPARISON:  No recent. FINDINGS: Diffuse osteopenia and degenerative change. Degenerative changes most prominent about the first MTP joint. Peripheral vascular calcification. IMPRESSION: 1. Diffuse osteopenia and degenerative change. Degenerative changes most prominent about the first MTP joint. No acute bony abnormality. 2.  Peripheral vascular disease. Electronically Signed   By: Marcello Moores  Register   On: 08/07/2019 10:36     Assessment/Plan  1. ESRD (end stage renal disease) ([place]) At the present time the patient has adequate dialysis access.  Continue hemodialysis as ordered without interruption.  Avoid nephrotoxic medications and dehydration.  Further plans per nephrology  2. Complication from renal dialysis device, sequela Recommend:  The patient is doing well and currently has adequate dialysis access. The patient's dialysis center is not reporting any access issues. Flow pattern is stable when compared to the prior ultrasound.  The patient should have a duplex ultrasound of the dialysis access in 6 months.  The patient will follow-up with me in the office after each ultrasound   3. Cough She has requested something for cough  I will send in an Rx for Tessalon and she should follow up with her primary  4. Mixed hyperlipidemia Continue statin as ordered and reviewed, no changes at this time   5. Paroxysmal atrial fibrillation  (HCC) Continue antiarrhythmia medications as already ordered, these medications have been reviewed and there are no changes at this time.  Continue anticoagulation as ordered by Cardiology Service   6. Type 2 diabetes mellitus with chronic kidney disease on chronic dialysis, without long-term current use of insulin (HCC) Continue hypoglycemic medications as already ordered, these medications have been reviewed and there are no changes at this time.  Hgb A1C to be monitored as already arranged by primary service     Hortencia Pilar, MD  08/20/2019 10:07 AM

## 2019-08-20 NOTE — Progress Notes (Signed)
   Subjective:  86 y.o. female with PMHx of diabetes mellitus presenting today for follow up evaluation of an ulceration of the left great toe. She states the area is improving and is less sore than it was previously. She has been soaking the toe for treatment. There are no modifying factors noted. Patient is here for further evaluation and treatment.   Past Medical History:  Diagnosis Date  . (HFpEF) heart failure with preserved ejection fraction ([place])    a. 10/2017 Echo: EF 60-65%, Gr1 DD, mild MR, mildly dil LA/RA. PASP 72mmHg.  . Arthritis   . Back pain   . Bronchitis   . Cataract   . Diabetes mellitus without complication (HCC)    no meds currently  . Dialysis patient ([place])   . ESRD (end stage renal disease) ([place])    a. 2018 - initially on HD but then transitioned to nightly PD in 08/2017.  [name] Gout   . Hypertension   . PAF (paroxysmal atrial fibrillation) (HCC)    a. CHA2DS2VASc = 5-->eliquis 2.5 BID.      Objective/Physical Exam General: The patient is alert and oriented x3 in no acute distress.  Dermatology:  Wound #1 noted to the left great toe measuring approximately 2.0 x 2.0 x 0.2 cm (LxWxD).   To the noted ulceration(s), there is no eschar. There is a moderate amount of slough, fibrin, and necrotic tissue noted. Granulation tissue and wound base is red. There is a minimal amount of serosanguineous drainage noted. There is exposed bone. There is no malodor. Periwound integrity is intact. Skin is warm, dry and supple bilateral lower extremities.  Vascular: Diminished pedal pulses bilaterally. No edema or erythema noted. Capillary refill within normal limits.  Neurological: Epicritic and protective threshold diminished bilaterally.   Musculoskeletal Exam: Range of motion within normal limits to all pedal and ankle joints bilateral. Muscle strength 5/5 in all groups bilateral.   Assessment: 1. Ulceration of the left great toe secondary to diabetes mellitus 2. diabetes  mellitus w/ peripheral neuropathy 3. PVD BLE   Plan of Care:  1. Patient was evaluated. 2. medically necessary excisional debridement including subcutaneous tissue was performed using a tissue nipper and a chisel blade. Excisional debridement of all the necrotic nonviable tissue down to healthy bleeding viable tissue was performed with post-debridement measurements same as pre-. 3. the wound was cleansed and dry sterile dressing applied. 4. Recommended using Betadine daily.  5. Post op shoe dispensed.  6. Continue management with Dr. Delana Meyer, Cardiovascular.  7. patient is to return to clinic in 2 weeks.   Edrick Kins, DPM [facility]  Dr. Edrick Kins, [address]                                        [place], [place] [address]                Office [phone]  Fax [phone]

## 2019-08-21 DIAGNOSIS — D631 Anemia in chronic kidney disease: Secondary | ICD-10-CM | POA: Diagnosis not present

## 2019-08-21 DIAGNOSIS — D509 Iron deficiency anemia, unspecified: Secondary | ICD-10-CM | POA: Diagnosis not present

## 2019-08-21 DIAGNOSIS — N186 End stage renal disease: Secondary | ICD-10-CM | POA: Diagnosis not present

## 2019-08-21 DIAGNOSIS — Z992 Dependence on renal dialysis: Secondary | ICD-10-CM | POA: Diagnosis not present

## 2019-08-21 DIAGNOSIS — N2581 Secondary hyperparathyroidism of renal origin: Secondary | ICD-10-CM | POA: Diagnosis not present

## 2019-08-23 DIAGNOSIS — D631 Anemia in chronic kidney disease: Secondary | ICD-10-CM | POA: Diagnosis not present

## 2019-08-23 DIAGNOSIS — Z992 Dependence on renal dialysis: Secondary | ICD-10-CM | POA: Diagnosis not present

## 2019-08-23 DIAGNOSIS — N186 End stage renal disease: Secondary | ICD-10-CM | POA: Diagnosis not present

## 2019-08-23 DIAGNOSIS — N2581 Secondary hyperparathyroidism of renal origin: Secondary | ICD-10-CM | POA: Diagnosis not present

## 2019-08-23 DIAGNOSIS — D509 Iron deficiency anemia, unspecified: Secondary | ICD-10-CM | POA: Diagnosis not present

## 2019-08-28 DIAGNOSIS — Z992 Dependence on renal dialysis: Secondary | ICD-10-CM | POA: Diagnosis not present

## 2019-08-28 DIAGNOSIS — D631 Anemia in chronic kidney disease: Secondary | ICD-10-CM | POA: Diagnosis not present

## 2019-08-28 DIAGNOSIS — N2581 Secondary hyperparathyroidism of renal origin: Secondary | ICD-10-CM | POA: Diagnosis not present

## 2019-08-28 DIAGNOSIS — D509 Iron deficiency anemia, unspecified: Secondary | ICD-10-CM | POA: Diagnosis not present

## 2019-08-28 DIAGNOSIS — N186 End stage renal disease: Secondary | ICD-10-CM | POA: Diagnosis not present

## 2019-08-31 ENCOUNTER — Ambulatory Visit (INDEPENDENT_AMBULATORY_CARE_PROVIDER_SITE_OTHER): Payer: Medicare Other | Admitting: Podiatry

## 2019-08-31 ENCOUNTER — Other Ambulatory Visit: Payer: Self-pay

## 2019-08-31 DIAGNOSIS — I739 Peripheral vascular disease, unspecified: Secondary | ICD-10-CM | POA: Diagnosis not present

## 2019-08-31 DIAGNOSIS — I96 Gangrene, not elsewhere classified: Secondary | ICD-10-CM

## 2019-08-31 DIAGNOSIS — E0843 Diabetes mellitus due to underlying condition with diabetic autonomic (poly)neuropathy: Secondary | ICD-10-CM

## 2019-08-31 DIAGNOSIS — I998 Other disorder of circulatory system: Secondary | ICD-10-CM

## 2019-08-31 DIAGNOSIS — I70229 Atherosclerosis of native arteries of extremities with rest pain, unspecified extremity: Secondary | ICD-10-CM

## 2019-08-31 DIAGNOSIS — L97502 Non-pressure chronic ulcer of other part of unspecified foot with fat layer exposed: Secondary | ICD-10-CM

## 2019-08-31 NOTE — Progress Notes (Signed)
   HPI: 86 y.o. female presenting today for follow-up evaluation of an ulcer to the left hallux.  Patient was last seen here in the office on 08/17/2019.  Patient states that she had a toenail removed from the hallux in the emergency department and she has been following up in the office for routine care.  I was under the impression that she is currently being managed for PVD by cardiovascular, although she states today that cardiovascular is not treating her lower extremity circulatory elements.  Only for the AV shunt for dialysis.  She has been applying Betadine daily to the toe.  She presents for further treatment evaluation  Past Medical History:  Diagnosis Date  . (HFpEF) heart failure with preserved ejection fraction ([place])    a. 10/2017 Echo: EF 60-65%, Gr1 DD, mild MR, mildly dil LA/RA. PASP 47mmHg.  . Arthritis   . Back pain   . Bronchitis   . Cataract   . Diabetes mellitus without complication (HCC)    no meds currently  . Dialysis patient ([place])   . ESRD (end stage renal disease) ([place])    a. 2018 - initially on HD but then transitioned to nightly PD in 08/2017.  [name] Gout   . Hypertension   . PAF (paroxysmal atrial fibrillation) (HCC)    a. CHA2DS2VASc = 5-->eliquis 2.5 BID.     Physical Exam: General: The patient is alert and oriented x3 in no acute distress.  Dermatology: Dry ischemic changes noted to the left hallux.  Ischemia extends to the MTPJ.  The left hallux does not appear to have an open draining wound, the toe is more mummified with gross appearance.  Vascular: Diminished pedal pulses left lower extremity. No edema or erythema noted.   Neurological: Epicritic and protective threshold diminished bilaterally.   Musculoskeletal Exam: Range of motion within normal limits to all pedal and ankle joints bilateral. Muscle strength 5/5 in all groups bilateral.   Assessment: 1.  Ischemic gangrene left hallux 2.  Critical limb ischemia left lower extremity 3.  Diabetes mellitus  with peripheral polyneuropathy 4.  ESRD   Plan of Care:  1. Patient evaluated.  I also spoke with daughter, Manson Passey, regarding the patient's condition currently, specifically regarding the ischemic gangrene to the left hallux 2.  Recommended to the patient and daughter that she does require amputation of the left hallux.  This would be after she has been thoroughly worked up and evaluated and optimized for peripheral vascular disease and critical limb ischemia.  At the moment the toe is stable with a dry gangrene.  Continue Betadine daily 3.  Order placed for arterial Doppler left lower extremity 4.  Referral back to Dr. Delana Meyer, cardiovascular for work-up and evaluation regarding LLE critical limb ischemia 5.  Return to clinic after vascular optimization for surgical consult regarding left hallux amputation      Edrick Kins, DPM [facility]  Dr. Edrick Kins, DPM    [address], [place] [address]                Office [phone]  Fax [phone]

## 2019-09-01 DIAGNOSIS — N2581 Secondary hyperparathyroidism of renal origin: Secondary | ICD-10-CM | POA: Diagnosis not present

## 2019-09-01 DIAGNOSIS — Z992 Dependence on renal dialysis: Secondary | ICD-10-CM | POA: Diagnosis not present

## 2019-09-01 DIAGNOSIS — N186 End stage renal disease: Secondary | ICD-10-CM | POA: Diagnosis not present

## 2019-09-01 DIAGNOSIS — D631 Anemia in chronic kidney disease: Secondary | ICD-10-CM | POA: Diagnosis not present

## 2019-09-01 DIAGNOSIS — D509 Iron deficiency anemia, unspecified: Secondary | ICD-10-CM | POA: Diagnosis not present

## 2019-09-03 ENCOUNTER — Ambulatory Visit: Payer: Medicare Other | Admitting: Podiatry

## 2019-09-04 ENCOUNTER — Telehealth: Payer: Self-pay | Admitting: Internal Medicine

## 2019-09-04 DIAGNOSIS — N186 End stage renal disease: Secondary | ICD-10-CM | POA: Diagnosis not present

## 2019-09-04 DIAGNOSIS — D631 Anemia in chronic kidney disease: Secondary | ICD-10-CM | POA: Diagnosis not present

## 2019-09-04 DIAGNOSIS — Z992 Dependence on renal dialysis: Secondary | ICD-10-CM | POA: Diagnosis not present

## 2019-09-04 DIAGNOSIS — N2581 Secondary hyperparathyroidism of renal origin: Secondary | ICD-10-CM | POA: Diagnosis not present

## 2019-09-04 DIAGNOSIS — D509 Iron deficiency anemia, unspecified: Secondary | ICD-10-CM | POA: Diagnosis not present

## 2019-09-04 NOTE — Telephone Encounter (Signed)
Patient has been contacted at least 3 times for a recall, recall has been deleted

## 2019-09-05 ENCOUNTER — Encounter (INDEPENDENT_AMBULATORY_CARE_PROVIDER_SITE_OTHER): Payer: Self-pay | Admitting: Nurse Practitioner

## 2019-09-05 ENCOUNTER — Ambulatory Visit (INDEPENDENT_AMBULATORY_CARE_PROVIDER_SITE_OTHER): Payer: Medicare Other

## 2019-09-05 ENCOUNTER — Other Ambulatory Visit: Payer: Self-pay

## 2019-09-05 ENCOUNTER — Ambulatory Visit (INDEPENDENT_AMBULATORY_CARE_PROVIDER_SITE_OTHER): Payer: Medicare Other | Admitting: Nurse Practitioner

## 2019-09-05 VITALS — BP 200/76 | HR 69 | Resp 12 | Ht 63.0 in | Wt 156.0 lb

## 2019-09-05 DIAGNOSIS — E11621 Type 2 diabetes mellitus with foot ulcer: Secondary | ICD-10-CM | POA: Diagnosis not present

## 2019-09-05 DIAGNOSIS — I10 Essential (primary) hypertension: Secondary | ICD-10-CM | POA: Diagnosis not present

## 2019-09-05 DIAGNOSIS — I739 Peripheral vascular disease, unspecified: Secondary | ICD-10-CM

## 2019-09-05 DIAGNOSIS — L97502 Non-pressure chronic ulcer of other part of unspecified foot with fat layer exposed: Secondary | ICD-10-CM | POA: Diagnosis not present

## 2019-09-05 DIAGNOSIS — L97523 Non-pressure chronic ulcer of other part of left foot with necrosis of muscle: Secondary | ICD-10-CM

## 2019-09-06 ENCOUNTER — Telehealth (INDEPENDENT_AMBULATORY_CARE_PROVIDER_SITE_OTHER): Payer: Self-pay

## 2019-09-06 DIAGNOSIS — Z992 Dependence on renal dialysis: Secondary | ICD-10-CM | POA: Diagnosis not present

## 2019-09-06 DIAGNOSIS — D509 Iron deficiency anemia, unspecified: Secondary | ICD-10-CM | POA: Diagnosis not present

## 2019-09-06 DIAGNOSIS — N2581 Secondary hyperparathyroidism of renal origin: Secondary | ICD-10-CM | POA: Diagnosis not present

## 2019-09-06 DIAGNOSIS — N186 End stage renal disease: Secondary | ICD-10-CM | POA: Diagnosis not present

## 2019-09-06 DIAGNOSIS — D631 Anemia in chronic kidney disease: Secondary | ICD-10-CM | POA: Diagnosis not present

## 2019-09-06 MED ORDER — HYDROCODONE-ACETAMINOPHEN 5-325 MG PO TABS: 1.0000 | ORAL_TABLET | Freq: Four times a day (QID) | ORAL | 0 refills | Status: DC | PRN

## 2019-09-06 NOTE — Telephone Encounter (Signed)
I attempted to contact the patient's daughter [name] to get the patient scheduled for a LLE angio, a message was left for a return call.

## 2019-09-07 ENCOUNTER — Other Ambulatory Visit
Admission: RE | Admit: 2019-09-07 | Discharge: 2019-09-07 | Disposition: A | Discharge status: Home / Self Care | Payer: Medicare Other | Source: Ambulatory Visit | Attending: Vascular Surgery | Admitting: Vascular Surgery

## 2019-09-07 ENCOUNTER — Other Ambulatory Visit: Payer: Self-pay

## 2019-09-07 DIAGNOSIS — Z20822 Contact with and (suspected) exposure to covid-19: Secondary | ICD-10-CM | POA: Insufficient documentation

## 2019-09-07 DIAGNOSIS — Z01812 Encounter for preprocedural laboratory examination: Secondary | ICD-10-CM | POA: Diagnosis not present

## 2019-09-07 LAB — SARS CORONAVIRUS 2 (TAT 6-24 HRS): SARS Coronavirus 2: NEGATIVE

## 2019-09-08 DIAGNOSIS — Z992 Dependence on renal dialysis: Secondary | ICD-10-CM | POA: Diagnosis not present

## 2019-09-08 DIAGNOSIS — N186 End stage renal disease: Secondary | ICD-10-CM | POA: Diagnosis not present

## 2019-09-08 DIAGNOSIS — D509 Iron deficiency anemia, unspecified: Secondary | ICD-10-CM | POA: Diagnosis not present

## 2019-09-08 DIAGNOSIS — N2581 Secondary hyperparathyroidism of renal origin: Secondary | ICD-10-CM | POA: Diagnosis not present

## 2019-09-08 DIAGNOSIS — D631 Anemia in chronic kidney disease: Secondary | ICD-10-CM | POA: Diagnosis not present

## 2019-09-09 DIAGNOSIS — Z992 Dependence on renal dialysis: Secondary | ICD-10-CM | POA: Diagnosis not present

## 2019-09-09 DIAGNOSIS — N186 End stage renal disease: Secondary | ICD-10-CM | POA: Diagnosis not present

## 2019-09-10 ENCOUNTER — Other Ambulatory Visit (INDEPENDENT_AMBULATORY_CARE_PROVIDER_SITE_OTHER): Payer: Self-pay | Admitting: Nurse Practitioner

## 2019-09-10 ENCOUNTER — Encounter (INDEPENDENT_AMBULATORY_CARE_PROVIDER_SITE_OTHER): Payer: Self-pay | Admitting: Nurse Practitioner

## 2019-09-10 DIAGNOSIS — D631 Anemia in chronic kidney disease: Secondary | ICD-10-CM | POA: Diagnosis not present

## 2019-09-10 DIAGNOSIS — D509 Iron deficiency anemia, unspecified: Secondary | ICD-10-CM | POA: Diagnosis not present

## 2019-09-10 DIAGNOSIS — Z992 Dependence on renal dialysis: Secondary | ICD-10-CM | POA: Diagnosis not present

## 2019-09-10 DIAGNOSIS — N2581 Secondary hyperparathyroidism of renal origin: Secondary | ICD-10-CM | POA: Diagnosis not present

## 2019-09-10 DIAGNOSIS — N186 End stage renal disease: Secondary | ICD-10-CM | POA: Diagnosis not present

## 2019-09-10 MED ORDER — CEFAZOLIN SODIUM-DEXTROSE 1-4 GM/50ML-% IV SOLN
1.0000 g | Freq: Once | INTRAVENOUS | Status: AC
  Administered 2019-09-11: 1 g via INTRAVENOUS

## 2019-09-10 NOTE — Progress Notes (Signed)
SUBJECTIVE:  Patient ID: Stacy Pham, female    DOB: 12-Jan-1931, 86 y.o.   MRN: [id number] Chief Complaint  Patient presents with  . Follow-up    Lo.BLE. ART DUP    HPI  Stacy Pham is a 86 y.o. female the presents today for noninvasive studies related to her left lower extremity.  The patient has been a previous patient of ours and we have previously treated her for her dialysis access however not for her peripheral arterial disease.  She was referred by her podiatrist Dr. Amalia Hailey for concerns about inadequate circulation due to her ulceration on her left great toe.  This ulceration has been persistent and has been slowly getting worse despite treatment efforts.  The patient does endorse having some pain with ambulation as well as with elevation.  She denies any fever, chills, nausea, vomiting or diarrhea.  Today patient had a left lower extremity arterial duplex done which revealed biphasic waveforms down to the level of the SFA.  At the proximal SFA transitions to monophasic waveforms until we get to the proximal popliteal artery where flow was not detected.  There is also collateral seen near the distal SFA.  There is also no flow detected at the distal anterior tibial artery.  There is dampened monophasic flow present at the distal popliteal as well as the distal posterior tibial and peroneal arteries.  The waveforms and flow patterns suggest an occlusive or near occlusive left proximal popliteal artery with two-vessel runoff visualized at the ankle level.  Past Medical History:  Diagnosis Date  . (HFpEF) heart failure with preserved ejection fraction ([place])    a. 10/2017 Echo: EF 60-65%, Gr1 DD, mild MR, mildly dil LA/RA. PASP 32mHg.  . Arthritis   . Back pain   . Bronchitis   . Cataract   . Diabetes mellitus without complication (HCC)    no meds currently  . Dialysis patient ([place]   . [place] ([place] renal disease) ([place]    a. 2018 - initially on HD but then transitioned to  nightly PD in 08/2017.  .Marland KitchenGout   . Hypertension   . PAF (paroxysmal atrial fibrillation) (HCC)    a. CHA2DS2VASc = 5-->eliquis 2.5 BID.    Past Surgical History:  Procedure Laterality Date  . AV FISTULA PLACEMENT Right 09/13/2018   Procedure: INSERTION OF ARTERIOVENOUS (AV) GORE-TEX GRAFT ARM;  Surgeon: SKatha Cabal MD;  Location: [facility] ORS;  Service: Vascular;  Laterality: Right;  . CAPD INSERTION N/A 06/08/2017   Procedure: LAPAROSCOPIC INSERTION CONTINUOUS AMBULATORY PERITONEAL DIALYSIS  (CAPD) CATHETER;  Surgeon: SKatha Cabal MD;  Location: [facility] ORS;  Service: Vascular;  Laterality: N/A;  . CATARACT EXTRACTION, BILATERAL Bilateral   . DIALYSIS/PERMA CATHETER INSERTION N/A 03/18/2017   Procedure: DIALYSIS/PERMA CATHETER INSERTION;  Surgeon: SKatha Cabal MD;  [facility];  Service: Cardiovascular;  Laterality: N/A;  . DIALYSIS/PERMA CATHETER REMOVAL N/A 10/20/2017   Procedure: DIALYSIS/PERMA CATHETER REMOVAL;  Surgeon: DAlgernon Huxley MD;  [facility];  Service: Cardiovascular;  Laterality: N/A;  . EYE SURGERY    . REMOVAL OF A DIALYSIS CATHETER N/A 02/23/2019   Procedure: REMOVAL OF A DIALYSIS CATHETER ( PD CATH);  Surgeon: SKatha Cabal MD;  Location: [facility];  Service: Vascular;  Laterality: N/A;    Social History   Socioeconomic History  . Marital status: Single    Spouse name: Not on file  . Number of children: Not on file  .  Years of education: Not on file  . Highest education level: Not on file  Occupational History  . Not on file  Tobacco Use  . Smoking status: Never Smoker  . Smokeless tobacco: Never Used  Substance and Sexual Activity  . Alcohol use: No  . Drug use: No  . Sexual activity: Not on file  Other Topics Concern  . Not on file  Social History Narrative   Retired. Lives in [place] with her dtr who takes care of her.  Pt uses a walker to get around, though admits that ambulation over all is  limited.   Social Determinants of Health   Financial Resource Strain:   . Difficulty of Paying Living Expenses: Not on file  Food Insecurity:   . Worried About Charity fundraiser in the Last Year: Not on file  . Ran Out of Food in the Last Year: Not on file  Transportation Needs:   . Lack of Transportation (Medical): Not on file  . Lack of Transportation (Non-Medical): Not on file  Physical Activity:   . Days of Exercise per Week: Not on file  . Minutes of Exercise per Session: Not on file  Stress:   . Feeling of Stress : Not on file  Social Connections:   . Frequency of Communication with Friends and Family: Not on file  . Frequency of Social Gatherings with Friends and Family: Not on file  . Attends Religious Services: Not on file  . Active Member of Clubs or Organizations: Not on file  . Attends Archivist Meetings: Not on file  . Marital Status: Not on file  Intimate Partner Violence:   . Fear of Current or Ex-Partner: Not on file  . Emotionally Abused: Not on file  . Physically Abused: Not on file  . Sexually Abused: Not on file    Family History  Problem Relation Age of Onset  . Hypertension Father     No Known Allergies   Review of Systems   Review of Systems: Negative Unless Checked Constitutional: '[]' Weight loss  '[]' Fever  '[]' Chills Cardiac: '[]' Chest pain   '[]'  Atrial Fibrillation  '[]' Palpitations   '[]' Shortness of breath when laying flat   '[]' Shortness of breath with exertion. '[]' Shortness of breath at rest Vascular:  '[]' Pain in legs with walking   '[]' Pain in legs with standing '[]' Pain in legs when laying flat   '[x]' Claudication    '[x]' Pain in feet when laying flat    '[]' History of DVT   '[]' Phlebitis   '[]' Swelling in legs   '[]' Varicose veins   '[x]' Non-healing ulcers Pulmonary:   '[]' Uses home oxygen   '[]' Productive cough   '[]' Hemoptysis   '[]' Wheeze  '[]' COPD   '[]' Asthma Neurologic:  '[]' Dizziness   '[]' Seizures  '[]' Blackouts '[]' History of stroke   '[]' History of TIA  '[]' Aphasia    '[]' Temporary Blindness   '[]' Weakness or numbness in arm   '[x]' Weakness or numbness in leg Musculoskeletal:   '[]' Joint swelling   '[]' Joint pain   '[]' Low back pain  '[]'  History of Knee Replacement '[]' Arthritis '[]' back Surgeries  '[]'  Spinal Stenosis    Hematologic:  '[]' Easy bruising  '[]' Easy bleeding   '[]' Hypercoagulable state   '[x]' Anemic Gastrointestinal:  '[]' Diarrhea   '[]' Vomiting  '[]' Gastroesophageal reflux/heartburn   '[]' Difficulty swallowing. '[]' Abdominal pain Genitourinary:  '[x]' Chronic kidney disease   '[]' Difficult urination  '[]' Anuric   '[]' Blood in urine '[]' Frequent urination  '[]' Burning with urination   '[]' Hematuria Skin:  '[]' Rashes   '[]' Ulcers '[]' Wounds Psychological:  '[]' History of anxiety   '[]'  History  of major depression  '[]'  Memory Difficulties      OBJECTIVE:   Physical Exam  BP (!) 200/76 Comment: had not taken BP med  Pulse 69   Resp 12   Ht '5\' 3"'  (1.6 m)   Wt 156 lb (70.8 kg)   BMI 27.63 kg/m   Gen: WD/WN, NAD Head: [place]/AT, No temporalis wasting.  Ear/Nose/Throat: Hearing grossly intact, nares w/o erythema or drainage Eyes: PER, EOMI, sclera nonicteric.  Neck: Supple, no masses.  No JVD.  Pulmonary:  Good air movement, no use of accessory muscles.  Cardiac: RRR Vascular:  Necrotic area on left great toe Vessel Right Left  Dorsalis Pedis Not Palpable Not Palpable  Posterior Tibial Not Palpable Not Palpable   Gastrointestinal: soft, non-distended. No guarding/no peritoneal signs.  Musculoskeletal: M/S 5/5 throughout.  No deformity or atrophy.  Neurologic: Pain and light touch intact in extremities.  Symmetrical.  Speech is fluent. Motor exam as listed above. Psychiatric: Judgment intact, Mood & affect appropriate for pt's clinical situation.       ASSESSMENT AND PLAN:  1. Diabetic ulcer of toe of left foot associated with type 2 diabetes mellitus, with necrosis of muscle ([place]) Discussed with patient and daughter how angiogram would assist with possible wound healing.  Based upon the lack of  ulceration today it is unlikely that even with revascularization the wound would heal and would likely require amputation.  However, even with amputation the patient's decreased circulation will likely cause issues with healing any amputation therefore in either case proceeding with angiogram would be in the patient's best interest.  The patient will continue to follow-up with Dr. Amalia Hailey regarding wound treatment as well as possible amputation.  2. PVD (peripheral vascular disease) ([place])  Recommend:  The patient has evidence of severe atherosclerotic changes of both lower extremities associated with ulceration and tissue loss of the foot.  This represents a limb threatening ischemia and places the patient at the risk for limb loss.  Patient should undergo angiography of the left lower extremities with the hope for intervention for limb salvage.  The risks and benefits as well as the alternative therapies was discussed in detail with the patient.  All questions were answered.  Patient agrees to proceed with angiography.  The patient will follow up with me in the office after the procedure.    3. Essential hypertension Today the patient has an extremely elevated blood pressure however she does endorse that she is not taking any of her blood pressure medication today.  Patient is currently asymptomatic and denies any headache or TIA or strokelike symptoms.  Patient is advised to take her medication as soon as she returns home.  If the patient should have any strokelike symptoms she should contact 911 or seek emergency attention immediately.   Current Outpatient Medications on File Prior to Visit  Medication Sig Dispense Refill  . acetaminophen (TYLENOL 8 HOUR ARTHRITIS PAIN) 650 MG CR tablet Take 650 mg every 8 (eight) hours as needed by mouth for pain.    [name] amiodarone (PACERONE) 200 MG tablet Take 200 mg by mouth every morning. Take 1 tablet (200 mg) by mouth once daily     . apixaban (ELIQUIS) 2.5  MG TABS tablet Take 1 tablet (2.5 mg total) by mouth 2 (two) times daily. 180 tablet 3  . atorvastatin (LIPITOR) 20 MG tablet TAKE 1 TABLET BY MOUTH EVERY DAY IN THE EVENING 90 tablet 0  . azithromycin (ZITHROMAX) 250 MG tablet Take by mouth  as directed.    . benzonatate (TESSALON PERLES) 100 MG capsule Take 1 capsule (100 mg total) by mouth 3 (three) times daily as needed for cough. 21 capsule 1  . blood glucose meter kit and supplies KIT Please dispense either One Touch or Bayer Contour She must have one of these machines since she is on peritoneal dialysis. Use up to four times daily as directed. (FOR ICD-9 250.00, 250.01). 1 each 0  . calcitRIOL (ROCALTROL) 0.25 MCG capsule Take 0.25 mcg by mouth daily.    . cinacalcet (SENSIPAR) 30 MG tablet Take 1 tablet (30 mg total) by mouth daily with supper. (Patient taking differently: Take 30 mg by mouth daily with supper. With largest meal) 60 tablet 1  . ciprofloxacin (CIPRO) 250 MG tablet Take 250 mg by mouth 2 (two) times daily.    . colchicine 0.6 MG tablet Take 1 tablet (0.6 mg total) by mouth daily as needed (for gout flare ups). 10 tablet 1  . ferric citrate (AURYXIA) 1 GM 210 MG(Fe) tablet Take 210 mg by mouth 3 (three) times daily with meals.    . gabapentin (NEURONTIN) 100 MG capsule Take 2 capsules (200 mg total) by mouth 3 (three) times daily. 360 capsule 4  . metoprolol tartrate (LOPRESSOR) 25 MG tablet Take 0.5 tablets (12.5 mg total) by mouth 2 (two) times daily. 90 tablet 2  . neomycin-polymyxin-pramoxine (NEOSPORIN PLUS) 1 % cream Apply topically 2 (two) times daily. 14.2 g 0  . predniSONE (DELTASONE) 20 MG tablet Take 20 mg by mouth daily with breakfast. Two doses    . sevelamer carbonate (RENVELA) 0.8 g PACK packet     . traMADol (ULTRAM) 50 MG tablet Take 1 tablet (50 mg total) by mouth every 12 (twelve) hours as needed. 20 tablet 0  . allopurinol (ZYLOPRIM) 100 MG tablet Take 1 tablet (100 mg total) by mouth at bedtime. (Patient not  taking: Reported on 09/05/2019) 90 tablet 3  . amLODipine (NORVASC) 5 MG tablet Take 1 tablet (5 mg total) by mouth daily. (Patient taking differently: Take 5 mg by mouth every morning. ) 90 tablet 2   No current facility-administered medications on file prior to visit.    There are no Patient Instructions on file for this visit. No follow-ups on file.   Kris Hartmann, NP  This note was completed with Sales executive.  Any errors are purely unintentional.

## 2019-09-11 ENCOUNTER — Ambulatory Visit
Admission: RE | Admit: 2019-09-11 | Discharge: 2019-09-11 | Disposition: A | Discharge status: Home / Self Care | Payer: Medicare Other | Attending: Vascular Surgery | Admitting: Vascular Surgery

## 2019-09-11 ENCOUNTER — Encounter
Admission: RE | Disposition: A | Discharge status: Home / Self Care | Payer: Self-pay | Source: Home / Self Care | Attending: Vascular Surgery

## 2019-09-11 ENCOUNTER — Other Ambulatory Visit: Payer: Self-pay

## 2019-09-11 ENCOUNTER — Encounter: Payer: Self-pay | Admitting: Vascular Surgery

## 2019-09-11 DIAGNOSIS — I70299 Other atherosclerosis of native arteries of extremities, unspecified extremity: Secondary | ICD-10-CM

## 2019-09-11 DIAGNOSIS — E1122 Type 2 diabetes mellitus with diabetic chronic kidney disease: Secondary | ICD-10-CM | POA: Diagnosis not present

## 2019-09-11 DIAGNOSIS — Z8249 Family history of ischemic heart disease and other diseases of the circulatory system: Secondary | ICD-10-CM | POA: Diagnosis not present

## 2019-09-11 DIAGNOSIS — I480 Paroxysmal atrial fibrillation: Secondary | ICD-10-CM | POA: Insufficient documentation

## 2019-09-11 DIAGNOSIS — Z79899 Other long term (current) drug therapy: Secondary | ICD-10-CM | POA: Diagnosis not present

## 2019-09-11 DIAGNOSIS — I132 Hypertensive heart and chronic kidney disease with heart failure and with stage 5 chronic kidney disease, or end stage renal disease: Secondary | ICD-10-CM | POA: Insufficient documentation

## 2019-09-11 DIAGNOSIS — M1990 Unspecified osteoarthritis, unspecified site: Secondary | ICD-10-CM | POA: Diagnosis not present

## 2019-09-11 DIAGNOSIS — E1152 Type 2 diabetes mellitus with diabetic peripheral angiopathy with gangrene: Secondary | ICD-10-CM | POA: Diagnosis not present

## 2019-09-11 DIAGNOSIS — Z95828 Presence of other vascular implants and grafts: Secondary | ICD-10-CM | POA: Insufficient documentation

## 2019-09-11 DIAGNOSIS — I5030 Unspecified diastolic (congestive) heart failure: Secondary | ICD-10-CM | POA: Insufficient documentation

## 2019-09-11 DIAGNOSIS — E11621 Type 2 diabetes mellitus with foot ulcer: Secondary | ICD-10-CM | POA: Diagnosis not present

## 2019-09-11 DIAGNOSIS — I70262 Atherosclerosis of native arteries of extremities with gangrene, left leg: Secondary | ICD-10-CM | POA: Insufficient documentation

## 2019-09-11 DIAGNOSIS — L97523 Non-pressure chronic ulcer of other part of left foot with necrosis of muscle: Secondary | ICD-10-CM | POA: Insufficient documentation

## 2019-09-11 DIAGNOSIS — L97909 Non-pressure chronic ulcer of unspecified part of unspecified lower leg with unspecified severity: Secondary | ICD-10-CM

## 2019-09-11 DIAGNOSIS — Z7901 Long term (current) use of anticoagulants: Secondary | ICD-10-CM | POA: Diagnosis not present

## 2019-09-11 DIAGNOSIS — Z992 Dependence on renal dialysis: Secondary | ICD-10-CM | POA: Insufficient documentation

## 2019-09-11 DIAGNOSIS — M109 Gout, unspecified: Secondary | ICD-10-CM | POA: Insufficient documentation

## 2019-09-11 DIAGNOSIS — N186 End stage renal disease: Secondary | ICD-10-CM | POA: Diagnosis not present

## 2019-09-11 HISTORY — PX: LOWER EXTREMITY ANGIOGRAPHY: CATH118251

## 2019-09-11 LAB — GLUCOSE, CAPILLARY: Glucose-Capillary: 114 mg/dL — ABNORMAL HIGH (ref 70–99)

## 2019-09-11 LAB — POTASSIUM (ARMC VASCULAR LAB ONLY): Potassium (ARMC vascular lab): 4.8 (ref 3.5–5.1)

## 2019-09-11 SURGERY — LOWER EXTREMITY ANGIOGRAPHY
Anesthesia: Moderate Sedation | Site: Leg Lower | Laterality: Left

## 2019-09-11 MED ORDER — METHYLPREDNISOLONE SODIUM SUCC 125 MG IJ SOLR: 125.0000 mg | Freq: Once | INTRAMUSCULAR | Status: DC | PRN

## 2019-09-11 MED ORDER — OXYCODONE HCL 5 MG PO TABS
ORAL_TABLET | ORAL | Status: AC
  Filled 2019-09-11: qty 1

## 2019-09-11 MED ORDER — FAMOTIDINE 20 MG PO TABS: 40.0000 mg | ORAL_TABLET | Freq: Once | ORAL | Status: DC | PRN

## 2019-09-11 MED ORDER — FENTANYL CITRATE (PF) 100 MCG/2ML IJ SOLN
INTRAMUSCULAR | Status: AC
  Filled 2019-09-11: qty 2

## 2019-09-11 MED ORDER — ACETAMINOPHEN 325 MG PO TABS: 650.0000 mg | ORAL_TABLET | ORAL | Status: DC | PRN

## 2019-09-11 MED ORDER — IODIXANOL 320 MG/ML IV SOLN
INTRAVENOUS | Status: DC | PRN
  Administered 2019-09-11: 90 mL via INTRA_ARTERIAL

## 2019-09-11 MED ORDER — CEFAZOLIN SODIUM-DEXTROSE 1-4 GM/50ML-% IV SOLN
INTRAVENOUS | Status: AC
  Filled 2019-09-11: qty 50

## 2019-09-11 MED ORDER — HYDROMORPHONE HCL 1 MG/ML IJ SOLN: 1.0000 mg | Freq: Once | INTRAMUSCULAR | Status: DC | PRN

## 2019-09-11 MED ORDER — SODIUM CHLORIDE 0.9% FLUSH: 3.0000 mL | Freq: Two times a day (BID) | INTRAVENOUS | Status: DC

## 2019-09-11 MED ORDER — MIDAZOLAM HCL 2 MG/2ML IJ SOLN
INTRAMUSCULAR | Status: DC | PRN
  Administered 2019-09-11 (×3): 1 mg via INTRAVENOUS
  Administered 2019-09-11: 0.5 mg via INTRAVENOUS

## 2019-09-11 MED ORDER — FENTANYL CITRATE (PF) 100 MCG/2ML IJ SOLN
INTRAMUSCULAR | Status: DC | PRN
  Administered 2019-09-11: 25 ug via INTRAVENOUS
  Administered 2019-09-11 (×2): 50 ug via INTRAVENOUS
  Administered 2019-09-11: 25 ug via INTRAVENOUS

## 2019-09-11 MED ORDER — HEPARIN SODIUM (PORCINE) 1000 UNIT/ML IJ SOLN
INTRAMUSCULAR | Status: DC | PRN
  Administered 2019-09-11: 4000 [IU] via INTRAVENOUS

## 2019-09-11 MED ORDER — ONDANSETRON HCL 4 MG/2ML IJ SOLN: 4.0000 mg | Freq: Four times a day (QID) | INTRAMUSCULAR | Status: DC | PRN

## 2019-09-11 MED ORDER — MIDAZOLAM HCL 5 MG/5ML IJ SOLN
INTRAMUSCULAR | Status: AC
  Filled 2019-09-11: qty 5

## 2019-09-11 MED ORDER — HEPARIN SODIUM (PORCINE) 1000 UNIT/ML IJ SOLN
INTRAMUSCULAR | Status: AC
  Filled 2019-09-11: qty 1

## 2019-09-11 MED ORDER — DIPHENHYDRAMINE HCL 50 MG/ML IJ SOLN: 50.0000 mg | Freq: Once | INTRAMUSCULAR | Status: DC | PRN

## 2019-09-11 MED ORDER — SODIUM CHLORIDE 0.9 % IV SOLN: 250.0000 mL | INTRAVENOUS | Status: DC | PRN

## 2019-09-11 MED ORDER — CLOPIDOGREL BISULFATE 300 MG PO TABS: 300.0000 mg | ORAL_TABLET | ORAL | Status: AC

## 2019-09-11 MED ORDER — MORPHINE SULFATE (PF) 4 MG/ML IV SOLN: 2.0000 mg | INTRAVENOUS | Status: DC | PRN

## 2019-09-11 MED ORDER — LABETALOL HCL 5 MG/ML IV SOLN: 10.0000 mg | INTRAVENOUS | Status: DC | PRN

## 2019-09-11 MED ORDER — SODIUM CHLORIDE 0.9% FLUSH: 3.0000 mL | INTRAVENOUS | Status: DC | PRN

## 2019-09-11 MED ORDER — SODIUM CHLORIDE 0.9 % IV SOLN: INTRAVENOUS | Status: DC

## 2019-09-11 MED ORDER — HYDRALAZINE HCL 20 MG/ML IJ SOLN: 5.0000 mg | INTRAMUSCULAR | Status: DC | PRN

## 2019-09-11 MED ORDER — CLOPIDOGREL BISULFATE 75 MG PO TABS
ORAL_TABLET | ORAL | Status: AC
  Administered 2019-09-11: 300 mg via ORAL
  Filled 2019-09-11: qty 1

## 2019-09-11 MED ORDER — OXYCODONE HCL 5 MG PO TABS
5.0000 mg | ORAL_TABLET | ORAL | Status: DC | PRN
  Administered 2019-09-11: 5 mg via ORAL

## 2019-09-11 MED ORDER — CLOPIDOGREL BISULFATE 75 MG PO TABS: 75.0000 mg | ORAL_TABLET | Freq: Every day | ORAL | 4 refills | Status: DC

## 2019-09-11 MED ORDER — MIDAZOLAM HCL 2 MG/ML PO SYRP: 8.0000 mg | ORAL_SOLUTION | Freq: Once | ORAL | Status: DC | PRN

## 2019-09-11 SURGICAL SUPPLY — 42 items
BALLN LUTONIX 5X120X130 (BALLOONS) ×3
BALLN LUTONIX DCB 4X100X130 (BALLOONS) ×3
BALLN LUTONIX DCB 5X100X130 (BALLOONS) ×3
BALLN ULTRVRSE 3X100X130C (BALLOONS) ×3
BALLN ULTRVRSE 3X40X130C (BALLOONS) ×3
BALLN ULTRVRSE 3X40X150 (BALLOONS) ×2
BALLN ULTRVRSE 3X40X150 OTW (BALLOONS) ×1
BALLOON LUTONIX 5X120X130 (BALLOONS) IMPLANT
BALLOON LUTONIX DCB 4X100X130 (BALLOONS) IMPLANT
BALLOON LUTONIX DCB 5X100X130 (BALLOONS) IMPLANT
BALLOON ULTRVRSE 3X100X130C (BALLOONS) IMPLANT
BALLOON ULTRVRSE 3X40X130C (BALLOONS) IMPLANT
BALLOON ULTRVRSE 3X40X150 OTW (BALLOONS) IMPLANT
CATH BEACON 5 .035 65 RIM TIP (CATHETERS) ×4 IMPLANT
CATH BERNSTEIN 5FR 130CM (CATHETERS) ×2 IMPLANT
CATH CROSSER S6 154CM (CATHETERS) ×2 IMPLANT
CATH CXI SUPP 2.6F 150 ANG (CATHETERS) ×2 IMPLANT
CATH DAV 5FR 125CM (CATHETERS) ×2 IMPLANT
CATH PIG 70CM (CATHETERS) ×2 IMPLANT
CATH SEEKER .035X135CM (CATHETERS) ×2 IMPLANT
CATH USHER ANGLED 130CM (CATHETERS) ×2 IMPLANT
CATH VERT 5FR 125CM (CATHETERS) ×2 IMPLANT
DEVICE PRESTO INFLATION (MISCELLANEOUS) ×2 IMPLANT
DEVICE STARCLOSE SE CLOSURE (Vascular Products) ×2 IMPLANT
DEVICE TORQUE .014-.018 (MISCELLANEOUS) IMPLANT
DEVICE TORQUE .025-.038 (MISCELLANEOUS) ×2 IMPLANT
GLIDEWIRE ADV .035X260CM (WIRE) ×4 IMPLANT
GLIDEWIRE ANGLED SS 035X260CM (WIRE) ×2 IMPLANT
GUIDEWIRE PFTE-COATED .018X300 (WIRE) ×2 IMPLANT
KIT FLOWMATE PROCEDURAL (KITS) ×2 IMPLANT
NDL ENTRY 21GA 7CM ECHOTIP (NEEDLE) IMPLANT
NEEDLE ENTRY 21GA 7CM ECHOTIP (NEEDLE) ×3 IMPLANT
PACK ANGIOGRAPHY (CUSTOM PROCEDURE TRAY) ×3 IMPLANT
SET INTRO CAPELLA COAXIAL (SET/KITS/TRAYS/PACK) ×2 IMPLANT
SHEATH BRITE TIP 5FRX11 (SHEATH) ×2 IMPLANT
SHEATH RAABE 7FR (SHEATH) ×2 IMPLANT
STENT LIFESTENT 5F 6X120X135 (Permanent Stent) ×2 IMPLANT
TORQUE DEVICE .014-.018 (MISCELLANEOUS) ×3
WIRE AMPLATZ SSTIFF .035X260CM (WIRE) ×2 IMPLANT
WIRE G V18X300CM (WIRE) ×4 IMPLANT
WIRE GUIDERIGHT .035X150 (WIRE) ×2 IMPLANT
WIRE MAGIC TORQUE 260C (WIRE) ×2 IMPLANT

## 2019-09-11 NOTE — Op Note (Signed)
[facility] Percutaneous Study/Intervention Procedural Note   Date of Surgery: 09/11/2019  Surgeon:  Katha Cabal, MD.  Pre-operative Diagnosis: Atherosclerotic occlusive disease bilateral lower extremities with ulceration and gangrene of the left great toe.  Post-operative diagnosis: Same  Procedure(s) Performed: 1. Introduction catheter into left lower extremity 3rd order catheter placement  2. Contrast injection left lower extremity for distal runoff   3. Crosser atherectomy of the left tibioperoneal trunk and peroneal artery 4. Percutaneous transluminal angioplasty tibioperoneal trunk and peroneal artery with a maximum of a 4 mm Lutonix balloon             5.  Percutaneous transluminal angioplasty and stent placement of the SFA and popliteal arteries                       6.   Star close closure right common femoral arteriotomy                Anesthesia: Conscious sedation was administered under my direct supervision by the interventional radiology RN. IV Versed plus fentanyl were utilized. Continuous ECG, pulse oximetry and blood pressure was monitored throughout the entire procedure. Conscious sedation was for a total of 140 minutes.  Sheath: 7 French Raby right common femoral retrograde  Contrast: 90 cc  Fluoroscopy Time: 29.1 minutes  Indications: [place] presents with atherosclerotic occlusive disease bilateral lower extremities. The patient has developed ulceration and gangrene of the soft tissues of the left lower extremity. This places the patient at high risk for limb loss and amputation. The risks and benefits are reviewed all questions answered patient agrees to proceed.  Procedure: Bobby Ragan is a 86 y.o. y.o. female who was identified and appropriate procedural time out was performed. The patient was then placed supine on the table and prepped and draped in the usual sterile  fashion.   Ultrasound was placed in the sterile sleeve and the right groin was evaluated the right common femoral artery was echolucent and pulsatile indicating patency.  Image was recorded for the permanent record and under real-time visualization a microneedle was inserted into the common femoral artery microwire followed by a micro-sheath.  A J-wire was then advanced through the micro-sheath and a  5 [place] sheath was then inserted over a J-wire. J-wire was then advanced and a 5 French pigtail catheter was positioned at the level of T12. AP projection of the aorta was then obtained. Pigtail catheter was repositioned to above the bifurcation and a RAO view of the pelvis was obtained.  Subsequently a rim catheter with the stiff angle Glidewire was used to cross the aortic bifurcation the catheter wire were advanced down into the left distal external iliac artery. Oblique view of the femoral bifurcation was then obtained and subsequently the wire was reintroduced and the pigtail catheter negotiated into the SFA representing third order catheter placement. Distal runoff was then performed.  Diagnostic interpretation: The abdominal aorta is opacified with a bolus injection of contrast there is nonvisualization of the kidneys consistent with end-stage renal disease.  There is mild atherosclerotic changes of the aorta.  There is marked tortuosity of the distal aorta and iliac arteries.  The common and external iliac arteries are widely patent bilaterally.  The left common femoral profunda femoris and proximal two thirds of the superficial femoral are widely patent.  There is diffuse calcific disease noted but no hemodynamically significant lesions.  At [facility] canal there is a 60% stenosis.  Several  centimeters more distally there is a greater than 95% stenosis.  In the mid popliteal there is a greater than 80% stenosis.  The popliteal from the level of the tibial plateau to the trifurcation is patent.  The  trifurcation is heavily diseased with occlusion of the anterior tibial just to centimeters distal to its origin as well as occlusion of the tibioperoneal trunk and the entirety of the posterior tibial.  The peroneal artery is reconstituted approximately 8 to 10 cm distal to its origin.  It is the dominant runoff to the foot.  Based on these findings I elected to move forward with angiography intervention.  4000 units of heparin was then given and allowed to circulate and a 7 [place] Raby sheath was advanced up and over the bifurcation and positioned in the proximal left superficial femoral artery.  Wire and catheter were then used to negotiate the SFA stenosis and the catheter was then positioned in the distal popliteal where magnified imaging of the trifurcation was performed.  A 4 mm x 100 Lutonix balloon was used to angioplasty the superficial femoral and popliteal arteries. Inflations were to 12 atmospheres for 2 minutes. Follow-up imaging demonstrated patency with greater than 50% residual stenosis and a 6 mm x 120 mm life stent was deployed across these lesions.  It was then postdilated with a 5 mm x 120 mm Lutonix balloon was utilized inflating to 12 atm for 2 full minutes. Follow-up imaging demonstrated less than 5% residual stenosis throughout the femoral-popliteal system and the trifurcation disease was now addressed.  The S6 Crosser catheter was then prepped on the field and a angled Usher catheter was advanced into the cul-de-sac of the tibioperoneal trunk under magnified imaging. Using the Crosser catheter the occlusion of the tibioperoneal trunk and peroneal was negotiated.  Injection of contrast confirmed intraluminal positioning.  TAV catheter and stiff angle Glidewire were then advanced down into the distal tibial.    A 3 mm Ultraverse balloon was then used to angioplasty the tibioperoneal trunk and artery. Inflations were to 10-12 atm for 1 full minute.  A second 3 mm balloon inflation  was utilized and ultimately a 4 mm x 100 mm Lutonix drug-eluting balloon was advanced across the proximal peroneal and the entirety of the tibioperoneal trunk into the distal popliteal.  Inflation was to 6 to 8 atm for approximately 2 minutes.  Follow-up imaging demonstrated patency of the tibial artery with less than 10% residual stenosis.  After review of these images the sheath is pulled into the right external iliac oblique of the common femoral is obtained and a Star close device deployed. There no immediate Complications.  Findings:  The abdominal aorta is opacified with a bolus injection of contrast there is nonvisualization of the kidneys consistent with end-stage renal disease.  There is mild atherosclerotic changes of the aorta.  There is marked tortuosity of the distal aorta and iliac arteries.  The common and external iliac arteries are widely patent bilaterally.  The left common femoral profunda femoris and proximal two thirds of the superficial femoral are widely patent.  There is diffuse calcific disease noted but no hemodynamically significant lesions.  At [facility] canal there is a 60% stenosis.  Several centimeters more distally there is a greater than 95% stenosis.  In the mid popliteal there is a greater than 80% stenosis.  The popliteal from the level of the tibial plateau to the trifurcation is patent.  The trifurcation is heavily diseased with occlusion of the anterior  tibial just to centimeters distal to its origin as well as occlusion of the tibioperoneal trunk and the entirety of the posterior tibial.  The peroneal artery is reconstituted approximately 8 to 10 cm distal to its origin.  It is the dominant runoff to the foot.  Following angioplasty of the distal SFA and the popliteal there is significant residual stenosis and a life stent is applied postdilated 5 mm.  Follow-up now shows less than 5% residual stenosis.  Following crosser atherectomy we have now recannulated the  tibioperoneal trunk and proximal peroneal.  Following angioplasty first 3 mm then to 4 mm the peroneal is widely patent in-line flow and looks quite nice with less than 10% residual stenosis.   Disposition: Patient was taken to the recovery room in stable condition having tolerated the procedure well.  Belenda Cruise Anzlee Hinesley 09/11/2019,11:52 AM

## 2019-09-11 NOTE — H&P (Signed)
[facility] History & Physical Update  The patient was interviewed and re-examined.  The patient's previous History and Physical has been reviewed and is unchanged.  There is no change in the plan of care. We plan to proceed with the scheduled procedure.  Hortencia Pilar, MD  09/11/2019, 7:59 AM

## 2019-09-12 ENCOUNTER — Other Ambulatory Visit: Payer: Self-pay | Admitting: Family Medicine

## 2019-09-12 DIAGNOSIS — M1A39X0 Chronic gout due to renal impairment, multiple sites, without tophus (tophi): Secondary | ICD-10-CM

## 2019-09-13 DIAGNOSIS — D631 Anemia in chronic kidney disease: Secondary | ICD-10-CM | POA: Diagnosis not present

## 2019-09-13 DIAGNOSIS — N2581 Secondary hyperparathyroidism of renal origin: Secondary | ICD-10-CM | POA: Diagnosis not present

## 2019-09-13 DIAGNOSIS — N186 End stage renal disease: Secondary | ICD-10-CM | POA: Diagnosis not present

## 2019-09-13 DIAGNOSIS — D509 Iron deficiency anemia, unspecified: Secondary | ICD-10-CM | POA: Diagnosis not present

## 2019-09-13 DIAGNOSIS — Z992 Dependence on renal dialysis: Secondary | ICD-10-CM | POA: Diagnosis not present

## 2019-09-14 ENCOUNTER — Other Ambulatory Visit: Payer: Self-pay

## 2019-09-14 ENCOUNTER — Ambulatory Visit (INDEPENDENT_AMBULATORY_CARE_PROVIDER_SITE_OTHER): Payer: Medicare Other | Admitting: Family Medicine

## 2019-09-14 ENCOUNTER — Ambulatory Visit: Payer: Medicare Other

## 2019-09-14 ENCOUNTER — Encounter: Payer: Self-pay | Admitting: Family Medicine

## 2019-09-14 ENCOUNTER — Telehealth: Payer: Self-pay | Admitting: Family Medicine

## 2019-09-14 ENCOUNTER — Telehealth: Payer: Self-pay

## 2019-09-14 ENCOUNTER — Telehealth: Payer: Self-pay | Admitting: *Deleted

## 2019-09-14 VITALS — BP 142/60 | HR 64 | Temp 97.2°F | Ht 63.0 in | Wt 160.8 lb

## 2019-09-14 DIAGNOSIS — L97523 Non-pressure chronic ulcer of other part of left foot with necrosis of muscle: Secondary | ICD-10-CM

## 2019-09-14 DIAGNOSIS — L97909 Non-pressure chronic ulcer of unspecified part of unspecified lower leg with unspecified severity: Secondary | ICD-10-CM

## 2019-09-14 DIAGNOSIS — I70299 Other atherosclerosis of native arteries of extremities, unspecified extremity: Secondary | ICD-10-CM | POA: Diagnosis not present

## 2019-09-14 DIAGNOSIS — E11621 Type 2 diabetes mellitus with foot ulcer: Secondary | ICD-10-CM

## 2019-09-14 DIAGNOSIS — H43392 Other vitreous opacities, left eye: Secondary | ICD-10-CM

## 2019-09-14 DIAGNOSIS — Z992 Dependence on renal dialysis: Secondary | ICD-10-CM

## 2019-09-14 DIAGNOSIS — N186 End stage renal disease: Secondary | ICD-10-CM | POA: Diagnosis not present

## 2019-09-14 DIAGNOSIS — I739 Peripheral vascular disease, unspecified: Secondary | ICD-10-CM

## 2019-09-14 DIAGNOSIS — E1122 Type 2 diabetes mellitus with diabetic chronic kidney disease: Secondary | ICD-10-CM

## 2019-09-14 LAB — CBC WITH DIFFERENTIAL/PLATELET
Basophils Absolute: 0 10*3/uL (ref 0.0–0.1)
Basophils Relative: 0.4 % (ref 0.0–3.0)
Eosinophils Absolute: 0.1 10*3/uL (ref 0.0–0.7)
Eosinophils Relative: 0.8 % (ref 0.0–5.0)
HCT: 34.6 % — ABNORMAL LOW (ref 36.0–46.0)
Hemoglobin: 11.1 g/dL — ABNORMAL LOW (ref 12.0–15.0)
Lymphocytes Relative: 13.9 % (ref 12.0–46.0)
Lymphs Abs: 1.3 10*3/uL (ref 0.7–4.0)
MCHC: 32 g/dL (ref 30.0–36.0)
MCV: 96.9 fl (ref 78.0–100.0)
Monocytes Absolute: 1.2 10*3/uL — ABNORMAL HIGH (ref 0.1–1.0)
Monocytes Relative: 13.2 % — ABNORMAL HIGH (ref 3.0–12.0)
Neutro Abs: 6.7 10*3/uL (ref 1.4–7.7)
Neutrophils Relative %: 71.7 % (ref 43.0–77.0)
Platelets: 311 10*3/uL (ref 150.0–400.0)
RBC: 3.57 Mil/uL — ABNORMAL LOW (ref 3.87–5.11)
RDW: 15.1 % (ref 11.5–15.5)
WBC: 9.4 10*3/uL (ref 4.0–10.5)

## 2019-09-14 LAB — COMPREHENSIVE METABOLIC PANEL
ALT: 4 U/L (ref 0–35)
AST: 12 U/L (ref 0–37)
Albumin: 3.6 g/dL (ref 3.5–5.2)
Alkaline Phosphatase: 114 U/L (ref 39–117)
BUN: 30 mg/dL — ABNORMAL HIGH (ref 6–23)
CO2: 29 mEq/L (ref 19–32)
Calcium: 9.8 mg/dL (ref 8.4–10.5)
Chloride: 98 mEq/L (ref 96–112)
Creatinine, Ser: 6.97 mg/dL (ref 0.40–1.20)
GFR: 6.75 mL/min — CL (ref >60.00)
Glucose, Bld: 114 mg/dL — ABNORMAL HIGH (ref 70–99)
Potassium: 4.9 mEq/L (ref 3.5–5.1)
Sodium: 139 mEq/L (ref 135–145)
Total Bilirubin: 0.6 mg/dL (ref 0.2–1.2)
Total Protein: 7.6 g/dL (ref 6.0–8.3)

## 2019-09-14 LAB — HEMOGLOBIN A1C: Hgb A1c MFr Bld: 5.7 % (ref 4.6–6.5)

## 2019-09-14 NOTE — Telephone Encounter (Signed)
Patient with end stage renal disease on dialysis.

## 2019-09-14 NOTE — Telephone Encounter (Signed)
Left detailed message on mobile number.

## 2019-09-14 NOTE — Telephone Encounter (Signed)
ATC several times, fast busy signal

## 2019-09-14 NOTE — Telephone Encounter (Signed)
-----   Message from Edrick Kins, DPM sent at 09/14/2019  3:40 PM EST ----- Regarding: f/u appt Will someone please reach out to this patient and get her a f/u appt here in [place] next week? Thanks, Dr. Amalia Hailey

## 2019-09-14 NOTE — Patient Instructions (Addendum)
Consider getting Covid 19 vaccine  Good to see you today  Please look at information regarding health care power of attorney, MOST form  Follow up in 6 months

## 2019-09-14 NOTE — Telephone Encounter (Signed)
[facility] called critical results @ 1500  Creatinine 6.97 GFR 6.75

## 2019-09-14 NOTE — Progress Notes (Signed)
Subjective:    Patient ID: Stacy Pham, female    DOB: 07-04-1931, 86 y.o.   MRN: [id number]  HPI Chief Complaint  Patient presents with  . Medicare Wellness    Pt c/o black floaters in left eye x 2 days, notes little blurred vision. No vision field loss that she is aware of.   This is an 86 year old female who presents today for follow-up of chronic medical conditions.  She is accompanied by her granddaughter.  End-stage renal disease-she has been going to dialysis on Tuesday Thursdays and Saturdays.  Peripheral vascular disease-she recently had a stent placed in her left leg.  She is being followed by vascular and podiatry for wound of left great toe.  Will need amputation of the toe.  Eye floaters-is noticed in left eye intermittently.  Does not have ophthalmologist locally.  No decreased visual acuity, eye pain or drainage.  Diabetes mellitus type 2-diet controlled.  Last hemoglobin A1c 03/26/2019 was 5.8.  Advanced directives-they are unsure if she has a healthcare power of attorney although she does have a power of attorney who is her daughter with whom she lives.  The patient states that she would want chest compressions but not artificial ventilation.    Review of Systems Denies chest pain, shortness of breath, abdominal pain, nausea/vomiting/diarrhea/constipation, recent falls    Objective:   Physical Exam Vitals reviewed.  Constitutional:      General: She is not in acute distress.    Appearance: She is normal weight. She is ill-appearing (chronically ill appearing, in wheelchair). She is not toxic-appearing or diaphoretic.  HENT:     Head: Normocephalic and atraumatic.  Eyes:     Conjunctiva/sclera: Conjunctivae normal.  Cardiovascular:     Rate and Rhythm: Normal rate. Rhythm irregular.     Heart sounds: Normal heart sounds.  Pulmonary:     Effort: Pulmonary effort is normal.     Breath sounds: Normal breath sounds.  Skin:    General: Skin is warm and dry.      Comments: Left great toe with open wound, pale, small amount dried blood on dressing, no drainage or erythema. Wound cleaned with sterile water and redressed.   Neurological:     Mental Status: She is alert and oriented to person, place, and time.  Psychiatric:        Mood and Affect: Mood normal.        Behavior: Behavior normal.        Thought Content: Thought content normal.        Judgment: Judgment normal.       BP (!) 142/60 (BP Location: Left Arm, Patient Position: Sitting, Cuff Size: Normal)   Pulse 64   Temp (!) 97.2 F (36.2 C) (Temporal)   Ht 5\' 3"  (1.6 m)   Wt 160 lb 12.8 oz (72.9 kg)   SpO2 98%   BMI 28.48 kg/m  Wt Readings from Last 3 Encounters:  09/14/19 160 lb 12.8 oz (72.9 kg)  09/11/19 156 lb 1.4 oz (70.8 kg)  09/05/19 156 lb (70.8 kg)       Assessment & Plan:  1. Type 2 diabetes mellitus with chronic kidney disease on chronic dialysis, without long-term current use of insulin ([place]) - Ambulatory referral to Ophthalmology - CBC with Differential - Comprehensive metabolic panel - Hemoglobin A1c  2. Diabetic ulcer of toe of left foot associated with type 2 diabetes mellitus, with necrosis of muscle ([place]) -Contacted patient's podiatrist for wound care instructions to relate  to patient's family -Dr. Amalia Hailey office will contact them regarding follow-up - CBC with Differential  3. PVD (peripheral vascular disease) ([place]) -Continue to follow-up with vascular surgery  4. Vitreous floaters of left eye - Ambulatory referral to Ophthalmology  Follow-up in 6 months  This visit occurred during the SARS-CoV-2 public health emergency.  Safety protocols were in place, including screening questions prior to the visit, additional usage of staff PPE, and extensive cleaning of exam room while observing appropriate contact time as indicated for disinfecting solutions.     Clarene Reamer, FNP-BC  [facility] [place]  [facility]  09/16/2019 7:53 AM

## 2019-09-14 NOTE — Telephone Encounter (Signed)
Please call patient or her daughter and tell them to clean the wound on her toe with Betadine daily and wrap.  The podiatrist will call about scheduling a follow-up appointment.

## 2019-09-14 NOTE — Telephone Encounter (Signed)
I attempted to call the patient to schedule her an appointment for next week.  I left her a message to call me back.

## 2019-09-15 DIAGNOSIS — Z992 Dependence on renal dialysis: Secondary | ICD-10-CM | POA: Diagnosis not present

## 2019-09-15 DIAGNOSIS — D631 Anemia in chronic kidney disease: Secondary | ICD-10-CM | POA: Diagnosis not present

## 2019-09-15 DIAGNOSIS — N186 End stage renal disease: Secondary | ICD-10-CM | POA: Diagnosis not present

## 2019-09-15 DIAGNOSIS — D509 Iron deficiency anemia, unspecified: Secondary | ICD-10-CM | POA: Diagnosis not present

## 2019-09-15 DIAGNOSIS — N2581 Secondary hyperparathyroidism of renal origin: Secondary | ICD-10-CM | POA: Diagnosis not present

## 2019-09-16 ENCOUNTER — Encounter: Payer: Self-pay | Admitting: Family Medicine

## 2019-09-17 NOTE — Telephone Encounter (Signed)
Called patient to and reschedule F/U appt, left voicemail.

## 2019-09-18 DIAGNOSIS — N2581 Secondary hyperparathyroidism of renal origin: Secondary | ICD-10-CM | POA: Diagnosis not present

## 2019-09-18 DIAGNOSIS — D509 Iron deficiency anemia, unspecified: Secondary | ICD-10-CM | POA: Diagnosis not present

## 2019-09-18 DIAGNOSIS — D631 Anemia in chronic kidney disease: Secondary | ICD-10-CM | POA: Diagnosis not present

## 2019-09-18 DIAGNOSIS — Z992 Dependence on renal dialysis: Secondary | ICD-10-CM | POA: Diagnosis not present

## 2019-09-18 DIAGNOSIS — N186 End stage renal disease: Secondary | ICD-10-CM | POA: Diagnosis not present

## 2019-09-18 NOTE — Telephone Encounter (Signed)
Noted  

## 2019-09-19 ENCOUNTER — Ambulatory Visit (INDEPENDENT_AMBULATORY_CARE_PROVIDER_SITE_OTHER): Payer: Medicare Other

## 2019-09-19 ENCOUNTER — Telehealth: Payer: Self-pay | Admitting: *Deleted

## 2019-09-19 DIAGNOSIS — Z0000 Encounter for general adult medical examination without abnormal findings: Secondary | ICD-10-CM

## 2019-09-19 NOTE — Telephone Encounter (Signed)
I attempted to call the patient.  Dr. Amalia Hailey wants to see her this week for a follow-up appointment.  I also Leta Jungling and left her a message.  I asked her  to give me a call back because we're trying to schedule [name] an appointment with Dr. Amalia Hailey.  We have made several attempts to reach the patient with no success.

## 2019-09-19 NOTE — Patient Instructions (Signed)
Stacy Pham , Thank you for taking time to come for your Medicare Wellness Visit. I appreciate your ongoing commitment to your health goals. Please review the following plan we discussed and let me know if I can assist you in the future.   Screening recommendations/referrals: Colonoscopy: no longer required Mammogram: no longer required Bone Density: completed 07/12/2014 Recommended yearly ophthalmology/optometry visit for glaucoma screening and checkup Recommended yearly dental visit for hygiene and checkup  Vaccinations: Influenza vaccine: Up to date, completed 03/22/2019 Pneumococcal vaccine: declined Tdap vaccine: decline Shingles vaccine: discussed    Advanced directives: Advance directive discussed with you today. Even though you declined this today please call our office should you change your mind and we can give you the proper paperwork for you to fill out.  Conditions/risks identified: diabetes, hypertension, hyperlipidemia  Next appointment: 03/21/2020 @ 11 am    Preventive Care 65 Years and Older, Female Preventive care refers to lifestyle choices and visits with your health care provider that can promote health and wellness. What does preventive care include?  A yearly physical exam. This is also called an annual well check.  Dental exams once or twice a year.  Routine eye exams. Ask your health care provider how often you should have your eyes checked.  Personal lifestyle choices, including:  Daily care of your teeth and gums.  Regular physical activity.  Eating a healthy diet.  Avoiding tobacco and drug use.  Limiting alcohol use.  Practicing safe sex.  Taking low-dose aspirin every day.  Taking vitamin and mineral supplements as recommended by your health care provider. What happens during an annual well check? The services and screenings done by your health care provider during your annual well check will depend on your age, overall health, lifestyle  risk factors, and family history of disease. Counseling  Your health care provider may ask you questions about your:  Alcohol use.  Tobacco use.  Drug use.  Emotional well-being.  Home and relationship well-being.  Sexual activity.  Eating habits.  History of falls.  Memory and ability to understand (cognition).  Work and work Statistician.  Reproductive health. Screening  You may have the following tests or measurements:  Height, weight, and BMI.  Blood pressure.  Lipid and cholesterol levels. These may be checked every 5 years, or more frequently if you are over 84 years old.  Skin check.  Lung cancer screening. You may have this screening every year starting at age 59 if you have a 30-pack-year history of smoking and currently smoke or have quit within the past 15 years.  Fecal occult blood test (FOBT) of the stool. You may have this test every year starting at age 68.  Flexible sigmoidoscopy or colonoscopy. You may have a sigmoidoscopy every 5 years or a colonoscopy every 10 years starting at age 23.  Hepatitis C blood test.  Hepatitis B blood test.  Sexually transmitted disease (STD) testing.  Diabetes screening. This is done by checking your blood sugar (glucose) after you have not eaten for a while (fasting). You may have this done every 1-3 years.  Bone density scan. This is done to screen for osteoporosis. You may have this done starting at age 7.  Mammogram. This may be done every 1-2 years. Talk to your health care provider about how often you should have regular mammograms. Talk with your health care provider about your test results, treatment options, and if necessary, the need for more tests. Vaccines  Your health care provider may  recommend certain vaccines, such as:  Influenza vaccine. This is recommended every year.  Tetanus, diphtheria, and acellular pertussis (Tdap, Td) vaccine. You may need a Td booster every 10 years.  Zoster vaccine.  You may need this after age 25.  Pneumococcal 13-valent conjugate (PCV13) vaccine. One dose is recommended after age 17.  Pneumococcal polysaccharide (PPSV23) vaccine. One dose is recommended after age 63. Talk to your health care provider about which screenings and vaccines you need and how often you need them. This information is not intended to replace advice given to you by your health care provider. Make sure you discuss any questions you have with your health care provider. Document Released: 07/25/2015 Document Revised: 03/17/2016 Document Reviewed: 04/29/2015 Elsevier Interactive Patient Education  2017 [place] Prevention in the Home Falls can cause injuries. They can happen to people of all ages. There are many things you can do to make your home safe and to help prevent falls. What can I do on the outside of my home?  Regularly fix the edges of walkways and driveways and fix any cracks.  Remove anything that might make you trip as you walk through a door, such as a raised step or threshold.  Trim any bushes or trees on the path to your home.  Use bright outdoor lighting.  Clear any walking paths of anything that might make someone trip, such as rocks or tools.  Regularly check to see if handrails are loose or broken. Make sure that both sides of any steps have handrails.  Any raised decks and porches should have guardrails on the edges.  Have any leaves, snow, or ice cleared regularly.  Use sand or salt on walking paths during winter.  Clean up any spills in your garage right away. This includes oil or grease spills. What can I do in the bathroom?  Use night lights.  Install grab bars by the toilet and in the tub and shower. Do not use towel bars as grab bars.  Use non-skid mats or decals in the tub or shower.  If you need to sit down in the shower, use a plastic, non-slip stool.  Keep the floor dry. Clean up any water that spills on the floor as soon  as it happens.  Remove soap buildup in the tub or shower regularly.  Attach bath mats securely with double-sided non-slip rug tape.  Do not have throw rugs and other things on the floor that can make you trip. What can I do in the bedroom?  Use night lights.  Make sure that you have a light by your bed that is easy to reach.  Do not use any sheets or blankets that are too big for your bed. They should not hang down onto the floor.  Have a firm chair that has side arms. You can use this for support while you get dressed.  Do not have throw rugs and other things on the floor that can make you trip. What can I do in the kitchen?  Clean up any spills right away.  Avoid walking on wet floors.  Keep items that you use a lot in easy-to-reach places.  If you need to reach something above you, use a strong step stool that has a grab bar.  Keep electrical cords out of the way.  Do not use floor polish or wax that makes floors slippery. If you must use wax, use non-skid floor wax.  Do not have throw rugs and  other things on the floor that can make you trip. What can I do with my stairs?  Do not leave any items on the stairs.  Make sure that there are handrails on both sides of the stairs and use them. Fix handrails that are broken or loose. Make sure that handrails are as long as the stairways.  Check any carpeting to make sure that it is firmly attached to the stairs. Fix any carpet that is loose or worn.  Avoid having throw rugs at the top or bottom of the stairs. If you do have throw rugs, attach them to the floor with carpet tape.  Make sure that you have a light switch at the top of the stairs and the bottom of the stairs. If you do not have them, ask someone to add them for you. What else can I do to help prevent falls?  Wear shoes that:  Do not have high heels.  Have rubber bottoms.  Are comfortable and fit you well.  Are closed at the toe. Do not wear sandals.  If  you use a stepladder:  Make sure that it is fully opened. Do not climb a closed stepladder.  Make sure that both sides of the stepladder are locked into place.  Ask someone to hold it for you, if possible.  Clearly mark and make sure that you can see:  Any grab bars or handrails.  First and last steps.  Where the edge of each step is.  Use tools that help you move around (mobility aids) if they are needed. These include:  Canes.  Walkers.  Scooters.  Crutches.  Turn on the lights when you go into a dark area. Replace any light bulbs as soon as they burn out.  Set up your furniture so you have a clear path. Avoid moving your furniture around.  If any of your floors are uneven, fix them.  If there are any pets around you, be aware of where they are.  Review your medicines with your doctor. Some medicines can make you feel dizzy. This can increase your chance of falling. Ask your doctor what other things that you can do to help prevent falls. This information is not intended to replace advice given to you by your health care provider. Make sure you discuss any questions you have with your health care provider. Document Released: 04/24/2009 Document Revised: 12/04/2015 Document Reviewed: 08/02/2014 Elsevier Interactive Patient Education  2017 [employer].

## 2019-09-19 NOTE — Progress Notes (Signed)
PCP notes:  Health Maintenance: Prevnar 13- declined   Abnormal Screenings: MMSE score 16    Patient concerns: none   Nurse concerns: none   Next PCP appt.: 03/21/2020 @ 11 am

## 2019-09-19 NOTE — Progress Notes (Signed)
Subjective:   Stacy Pham is a 86 y.o. female who presents for Medicare Annual (Subsequent) preventive examination.  Review of Systems: N/A   This visit is being conducted through telemedicine via telephone at the nurse health advisor's home address due to the COVID-19 pandemic. This patient has given me verbal consent via doximity to conduct this visit, patient states they are participating from their home address. Patient has given verbal consent for her daughter, Stacy Pham to assist her during the call. Patient, Stacy Pham (daughter)  and myself are on the telephone call. There is no referral for this visit. Some vital signs may be absent or patient reported.    Patient identification: identified by name, DOB, and current address   Cardiac Risk Factors include: advanced age (>44mn, >>59women);diabetes mellitus;hypertension;dyslipidemia     Objective:     Vitals: There were no vitals taken for this visit.  There is no height or weight on file to calculate BMI.  Advanced Directives 09/19/2019 09/11/2019 08/07/2019 02/16/2019 01/18/2019 01/18/2019 12/15/2018  Does Patient Have a Medical Advance Directive? _0  No Yes  Type of Advance Directive - - - - - - -  Would patient like information on creating a medical advance directive? No - Patient declined No - Patient declined - - No - Patient declined - -    Tobacco Social History   Tobacco Use  Smoking Status Never Smoker  Smokeless Tobacco Never Used     Counseling given: Not Answered   Clinical Intake:  Pre-visit preparation completed: Yes  Pain : No/denies pain Pain Score: 0-No pain Faces Pain Scale: No hurt  Faces Pain Scale: No hurt  Nutritional Risks: None Diabetes: Yes CBG done?: No Did pt. bring in CBG monitor from home?: No  How often do you need to have someone help you when you read instructions, pamphlets, or other written materials from your doctor or pharmacy?: 1 - Never What is the last  grade level you completed in school?: 6th  Interpreter Needed?: No  Information entered by :: CJohnson, LPN  Past Medical History:  Diagnosis Date  . (HFpEF) heart failure with preserved ejection fraction ([place]    a. 10/2017 Echo: EF 60-65%, Gr1 DD, mild MR, mildly dil LA/RA. PASP 411mg.  . Arthritis   . Back pain   . Bronchitis   . Cataract   . Diabetes mellitus without complication (HCC)    no meds currently  . [place] ([place]  . ESRD (end stage renal disease) ([place]   a. 2018 - initially on HD but then transitioned to nightly PD in 08/2017.  . Marland Kitchenout   . Hypertension   . PAF (paroxysmal atrial fibrillation) (HCC)    a. CHA2DS2VASc = 5-->eliquis 2.5 BID.   Past Surgical History:  Procedure Laterality Date  . AV FISTULA PLACEMENT Right 09/13/2018   Procedure: INSERTION OF ARTERIOVENOUS (AV) GORE-TEX GRAFT ARM;  Surgeon: ScKatha CabalMD;  Location: [facility];  Service: Vascular;  Laterality: Right;  . CAPD INSERTION N/A 06/08/2017   Procedure: LAPAROSCOPIC INSERTION CONTINUOUS AMBULATORY PERITONEAL DIALYSIS  (CAPD) CATHETER;  Surgeon: ScKatha CabalMD;  Location: [facility];  Service: Vascular;  Laterality: N/A;  . CATARACT EXTRACTION, BILATERAL Bilateral   . DIALYSIS/PERMA CATHETER INSERTION N/A 03/18/2017   Procedure: DIALYSIS/PERMA CATHETER INSERTION;  Surgeon: ScKatha CabalMD;  [facility];  Service: Cardiovascular;  Laterality: N/A;  . DIALYSIS/PERMA CATHETER REMOVAL N/A 10/20/2017   Procedure:  DIALYSIS/PERMA CATHETER REMOVAL;  Surgeon: Algernon Huxley, MD;  Location: [place] [facility];  Service: Cardiovascular;  Laterality: N/A;  . EYE SURGERY    . LOWER EXTREMITY ANGIOGRAPHY Left 09/11/2019   Procedure: LOWER EXTREMITY ANGIOGRAPHY;  Surgeon: Katha Cabal, MD;  Location: [place] [facility];  Service: Cardiovascular;  Laterality: Left;  . REMOVAL OF A DIALYSIS CATHETER N/A 02/23/2019   Procedure: REMOVAL OF A DIALYSIS CATHETER ( PD  CATH);  Surgeon: Katha Cabal, MD;  Location: [facility];  Service: Vascular;  Laterality: N/A;   Family History  Problem Relation Age of Onset  . Hypertension Father    Social History   Socioeconomic History  . Marital status: Single    Spouse name: Not on file  . Number of children: Not on file  . Years of education: Not on file  . Highest education level: Not on file  Occupational History  . Not on file  Tobacco Use  . Smoking status: Never Smoker  . Smokeless tobacco: Never Used  Substance and Sexual Activity  . Alcohol use: No  . Drug use: No  . Sexual activity: Not on file  Other Topics Concern  . Not on file  Social History Narrative   Retired. Lives in [place] with her dtr who takes care of her.  Pt uses a walker to get around, though admits that ambulation over all is limited.   Social Determinants of Health   Financial Resource Strain: Low Risk   . Difficulty of Paying Living Expenses: Not hard at all  Food Insecurity: No Food Insecurity  . Worried About Charity fundraiser in the Last Year: Never true  . Ran Out of Food in the Last Year: Never true  Transportation Needs: No Transportation Needs  . Lack of Transportation (Medical): No  . Lack of Transportation (Non-Medical): No  Physical Activity: Inactive  . Days of Exercise per Week: 0 days  . Minutes of Exercise per Session: 0 min  Stress: No Stress Concern Present  . Feeling of Stress : Not at all  Social Connections:   . Frequency of Communication with Friends and Family: Not on file  . Frequency of Social Gatherings with Friends and Family: Not on file  . Attends Religious Services: Not on file  . Active Member of Clubs or Organizations: Not on file  . Attends Archivist Meetings: Not on file  . Marital Status: Not on file    Outpatient Encounter Medications as of 09/19/2019  Medication Sig  . acetaminophen (TYLENOL 8 HOUR ARTHRITIS PAIN) 650 MG CR tablet Take 650 mg every 8  (eight) hours as needed by mouth for pain.  [name] allopurinol (ZYLOPRIM) 100 MG tablet TAKE 1 TABLET BY MOUTH EVERYDAY AT BEDTIME  . amiodarone (PACERONE) 200 MG tablet Take 200 mg by mouth every morning. Take 1 tablet (200 mg) by mouth once daily   . apixaban (ELIQUIS) 2.5 MG TABS tablet Take 1 tablet (2.5 mg total) by mouth 2 (two) times daily.  [name] atorvastatin (LIPITOR) 20 MG tablet TAKE 1 TABLET BY MOUTH EVERY DAY IN THE EVENING  . blood glucose meter kit and supplies KIT Please dispense either One Touch or Bayer Contour She must have one of these machines since she is on peritoneal dialysis. Use up to four times daily as directed. (FOR ICD-9 250.00, 250.01).  . calcitRIOL (ROCALTROL) 0.25 MCG capsule Take 0.25 mcg by mouth daily.  . cinacalcet (SENSIPAR) 30 MG tablet Take 1  tablet (30 mg total) by mouth daily with supper. (Patient taking differently: Take 30 mg by mouth daily with supper. With largest meal)  . clopidogrel (PLAVIX) 75 MG tablet Take 1 tablet (75 mg total) by mouth daily.  . colchicine 0.6 MG tablet Take 1 tablet (0.6 mg total) by mouth daily as needed (for gout flare ups).  . ferric citrate (AURYXIA) 1 GM 210 MG(Fe) tablet Take 210 mg by mouth 3 (three) times daily with meals.  . gabapentin (NEURONTIN) 100 MG capsule Take 2 capsules (200 mg total) by mouth 3 (three) times daily.  [name] HYDROcodone-acetaminophen (NORCO) 5-325 MG tablet Take 1-2 tablets by mouth every 6 (six) hours as needed for moderate pain or severe pain.  . metoprolol tartrate (LOPRESSOR) 25 MG tablet Take 0.5 tablets (12.5 mg total) by mouth 2 (two) times daily.  [name] neomycin-polymyxin-pramoxine (NEOSPORIN PLUS) 1 % cream Apply topically 2 (two) times daily.  . sevelamer carbonate (RENVELA) 0.8 g PACK packet   . traMADol (ULTRAM) 50 MG tablet Take 1 tablet (50 mg total) by mouth every 12 (twelve) hours as needed.  [name] amLODipine (NORVASC) 5 MG tablet Take 1 tablet (5 mg total) by mouth daily. (Patient taking differently:  Take 5 mg by mouth every morning. )   No facility-administered encounter medications on file as of 09/19/2019.    Activities of Daily Living In your present state of health, do you have any difficulty performing the following activities: 09/19/2019 09/11/2019  Hearing? Y N  Comment some hearing issues -  Vision? N N  Difficulty concentrating or making decisions? N N  Walking or climbing stairs? N N  Dressing or bathing? N N  Doing errands, shopping? N -  Preparing Food and eating ? N -  Using the Toilet? N -  In the past six months, have you accidently leaked urine? N -  Do you have problems with loss of bowel control? N -  Managing your Medications? N -  Managing your Finances? N -  Housekeeping or managing your Housekeeping? N -  Some recent data might be hidden    Patient Care Team: Elby Beck, FNP as PCP - General (Nurse Practitioner) End, Harrell Gave, MD as PCP - Cardiology (Cardiology)    Assessment:   This is a routine wellness examination for [place].  Exercise Activities and Dietary recommendations Current Exercise Habits: The patient does not participate in regular exercise at present, Exercise limited by: None identified  Goals    . Follow up with Primary Care Provider     Starting 09/08/2018, I will continue to take medications as prescribed and to keep appointments with PCP as scheduled.     . Patient Stated     09/19/2019, I will maintain and continue medications as prescribed.        Fall Risk Fall Risk  09/19/2019 01/26/2019 09/08/2018 09/07/2017  Falls in the past year? _0 Yes  Comment fell outside getting in car - - fell out of bed  Number falls in past yr: 0 1 0 1  Injury with Fall? 0 0 1 No  Risk for fall due to : Medication side effect History of fall(s);Impaired balance/gait;Impaired mobility;Medication side effect - -  Follow up Falls evaluation completed;Falls prevention discussed Falls evaluation completed;Falls prevention  discussed;Education provided - -   Is the patient's home free of loose throw rugs in walkways, pet beds, electrical cords, etc?   yes      Grab bars in the bathroom? no  Handrails on the stairs?   yes      Adequate lighting?   yes  Timed Get Up and Go performed: N/A  Depression Screen PHQ 2/9 Scores 09/19/2019 09/08/2018 09/07/2017 05/18/2017  PHQ - 2 Score 0 0 0 0  PHQ- 9 Score 0 0 0 -     Cognitive Function MMSE - Mini Mental State Exam 09/19/2019 09/08/2018 09/07/2017  Orientation to time _0 Orientation to Place 0 5 5  Registration _1 Attention/ Calculation 5 0 0  Recall _2 Recall-comments - unable to recall 1 of 3 words unable to recall 1 of 3 words  Language- name 2 objects - 0 0  Language- repeat _3 Language- follow 3 step command - 0 2  Language- follow 3 step command-comments - unable to follow 3 steps of 3 step command unable to follow 1 step of 3 step command  Language- read & follow direction - 0 0  Write a sentence - 0 0  Copy design - 0 0  Total score - 16 17  Mini Cog  Mini-Cog screen was completed. Maximum score is 22. A value of 0 denotes this part of the MMSE was not completed or the patient failed this part of the Mini-Cog screening.       Immunization History  Administered Date(s) Administered  . Hepatitis B, adult 02/23/2017, 03/23/2017, 04/29/2017, 11/17/2017, 05/15/2019  . Influenza, Seasonal, Injecte, Preservative Fre 04/20/2017, 04/12/2018  . Influenza-Unspecified 04/20/2017, 04/12/2018, 03/22/2019  . PPD Test 03/16/2017, 07/27/2017, 07/18/2018, 01/23/2019  . Pneumococcal Polysaccharide-23 11/17/2017  . Pneumococcal-Unspecified 11/17/2017    Qualifies for Shingles Vaccine: Yes  Screening Tests Health Maintenance  Topic Date Due  . FOOT EXAM  09/09/2019  . OPHTHALMOLOGY EXAM  07/11/2020 (Originally 10/27/1940)  . TETANUS/TDAP  07/11/2020 (Originally 10/27/1949)  . PNA vac Low Risk Adult (2 of 2 - PCV13) 07/11/2020 (Originally  11/18/2018)  . HEMOGLOBIN A1C  03/16/2020  . INFLUENZA VACCINE  Completed  . DEXA SCAN  Completed  . URINE MICROALBUMIN  Discontinued    Cancer Screenings: Lung: Low Dose CT Chest recommended if Age 25-80 years, 30 pack-year currently smoking OR have quit w/in 15 years. Patient does not qualify. Breast: Mammogram: no longer required   Bone Density/Dexa: completed 07/12/2014 Colorectal: no longer required  Additional Screenings:  Hepatitis C Screening: N/A     Plan:   Patient will maintain and continue medications as prescribed.   I have personally reviewed and noted the following in the patient's chart:   . Medical and social history . Use of alcohol, tobacco or illicit drugs  . Current medications and supplements . Functional ability and status . Nutritional status . Physical activity . Advanced directives . List of other physicians . Hospitalizations, surgeries, and ER visits in previous 12 months . Vitals . Screenings to include cognitive, depression, and falls . Referrals and appointments  In addition, I have reviewed and discussed with patient certain preventive protocols, quality metrics, and best practice recommendations. A written personalized care plan for preventive services as well as general preventive health recommendations were provided to patient.     Andrez Grime, LPN  [id number]

## 2019-09-20 DIAGNOSIS — Z992 Dependence on renal dialysis: Secondary | ICD-10-CM | POA: Diagnosis not present

## 2019-09-20 DIAGNOSIS — N2581 Secondary hyperparathyroidism of renal origin: Secondary | ICD-10-CM | POA: Diagnosis not present

## 2019-09-20 DIAGNOSIS — N186 End stage renal disease: Secondary | ICD-10-CM | POA: Diagnosis not present

## 2019-09-20 DIAGNOSIS — D509 Iron deficiency anemia, unspecified: Secondary | ICD-10-CM | POA: Diagnosis not present

## 2019-09-20 DIAGNOSIS — D631 Anemia in chronic kidney disease: Secondary | ICD-10-CM | POA: Diagnosis not present

## 2019-09-20 NOTE — Telephone Encounter (Signed)
Second attempt made to schedule a f/u appt with Dr. Amalia Hailey

## 2019-09-21 ENCOUNTER — Other Ambulatory Visit: Payer: Self-pay | Admitting: Family Medicine

## 2019-09-21 DIAGNOSIS — H9190 Unspecified hearing loss, unspecified ear: Secondary | ICD-10-CM

## 2019-09-22 ENCOUNTER — Telehealth: Payer: Self-pay | Admitting: Family Medicine

## 2019-09-22 DIAGNOSIS — N2581 Secondary hyperparathyroidism of renal origin: Secondary | ICD-10-CM | POA: Diagnosis not present

## 2019-09-22 DIAGNOSIS — D631 Anemia in chronic kidney disease: Secondary | ICD-10-CM | POA: Diagnosis not present

## 2019-09-22 DIAGNOSIS — D509 Iron deficiency anemia, unspecified: Secondary | ICD-10-CM | POA: Diagnosis not present

## 2019-09-22 DIAGNOSIS — N186 End stage renal disease: Secondary | ICD-10-CM | POA: Diagnosis not present

## 2019-09-22 DIAGNOSIS — Z992 Dependence on renal dialysis: Secondary | ICD-10-CM | POA: Diagnosis not present

## 2019-09-22 NOTE — Telephone Encounter (Signed)
Please call patient and clarify whether she is taking clopidogrel and/or Eliquis. She should only be one of these.

## 2019-09-24 ENCOUNTER — Other Ambulatory Visit: Payer: Self-pay | Admitting: Family Medicine

## 2019-09-24 DIAGNOSIS — I480 Paroxysmal atrial fibrillation: Secondary | ICD-10-CM

## 2019-09-24 DIAGNOSIS — T82858A Stenosis of vascular prosthetic devices, implants and grafts, initial encounter: Secondary | ICD-10-CM | POA: Diagnosis not present

## 2019-09-24 DIAGNOSIS — I871 Compression of vein: Secondary | ICD-10-CM | POA: Diagnosis not present

## 2019-09-25 DIAGNOSIS — N2581 Secondary hyperparathyroidism of renal origin: Secondary | ICD-10-CM | POA: Diagnosis not present

## 2019-09-25 DIAGNOSIS — N186 End stage renal disease: Secondary | ICD-10-CM | POA: Diagnosis not present

## 2019-09-25 DIAGNOSIS — D509 Iron deficiency anemia, unspecified: Secondary | ICD-10-CM | POA: Diagnosis not present

## 2019-09-25 DIAGNOSIS — D631 Anemia in chronic kidney disease: Secondary | ICD-10-CM | POA: Diagnosis not present

## 2019-09-25 DIAGNOSIS — Z992 Dependence on renal dialysis: Secondary | ICD-10-CM | POA: Diagnosis not present

## 2019-09-26 NOTE — Telephone Encounter (Signed)
Message left for patient to return my call.  

## 2019-09-27 DIAGNOSIS — D509 Iron deficiency anemia, unspecified: Secondary | ICD-10-CM | POA: Diagnosis not present

## 2019-09-27 DIAGNOSIS — N2581 Secondary hyperparathyroidism of renal origin: Secondary | ICD-10-CM | POA: Diagnosis not present

## 2019-09-27 DIAGNOSIS — D631 Anemia in chronic kidney disease: Secondary | ICD-10-CM | POA: Diagnosis not present

## 2019-09-27 DIAGNOSIS — N186 End stage renal disease: Secondary | ICD-10-CM | POA: Diagnosis not present

## 2019-09-27 DIAGNOSIS — Z992 Dependence on renal dialysis: Secondary | ICD-10-CM | POA: Diagnosis not present

## 2019-09-27 NOTE — Telephone Encounter (Signed)
Message left for patient to return my call.  

## 2019-09-29 DIAGNOSIS — N186 End stage renal disease: Secondary | ICD-10-CM | POA: Diagnosis not present

## 2019-09-29 DIAGNOSIS — Z992 Dependence on renal dialysis: Secondary | ICD-10-CM | POA: Diagnosis not present

## 2019-09-29 DIAGNOSIS — D509 Iron deficiency anemia, unspecified: Secondary | ICD-10-CM | POA: Diagnosis not present

## 2019-09-29 DIAGNOSIS — N2581 Secondary hyperparathyroidism of renal origin: Secondary | ICD-10-CM | POA: Diagnosis not present

## 2019-09-29 DIAGNOSIS — D631 Anemia in chronic kidney disease: Secondary | ICD-10-CM | POA: Diagnosis not present

## 2019-10-01 ENCOUNTER — Other Ambulatory Visit (INDEPENDENT_AMBULATORY_CARE_PROVIDER_SITE_OTHER): Payer: Self-pay | Admitting: Nurse Practitioner

## 2019-10-01 ENCOUNTER — Telehealth (INDEPENDENT_AMBULATORY_CARE_PROVIDER_SITE_OTHER): Payer: Self-pay

## 2019-10-01 MED ORDER — HYDROCODONE-ACETAMINOPHEN 5-325 MG PO TABS: 1.0000 | ORAL_TABLET | Freq: Four times a day (QID) | ORAL | 0 refills | Status: DC | PRN

## 2019-10-01 NOTE — Telephone Encounter (Signed)
Refill sent.

## 2019-10-01 NOTE — Telephone Encounter (Signed)
Patient's daughter returned phone call and states that patient is taking both Eliquis and Plavix. She states that Dr. Delana Meyer recommends that with her circulation issues, as well as her history with blood clots, that she should take both of these medications. Will route to [place] as FYI.

## 2019-10-01 NOTE — Telephone Encounter (Signed)
Patient daughter has been aware that Rx was sent to pharmacy

## 2019-10-01 NOTE — Telephone Encounter (Signed)
Message left for patient to return my call.  

## 2019-10-02 DIAGNOSIS — Z992 Dependence on renal dialysis: Secondary | ICD-10-CM | POA: Diagnosis not present

## 2019-10-02 DIAGNOSIS — D631 Anemia in chronic kidney disease: Secondary | ICD-10-CM | POA: Diagnosis not present

## 2019-10-02 DIAGNOSIS — N2581 Secondary hyperparathyroidism of renal origin: Secondary | ICD-10-CM | POA: Diagnosis not present

## 2019-10-02 DIAGNOSIS — N186 End stage renal disease: Secondary | ICD-10-CM | POA: Diagnosis not present

## 2019-10-02 DIAGNOSIS — D509 Iron deficiency anemia, unspecified: Secondary | ICD-10-CM | POA: Diagnosis not present

## 2019-10-03 DIAGNOSIS — B351 Tinea unguium: Secondary | ICD-10-CM | POA: Diagnosis not present

## 2019-10-03 DIAGNOSIS — E1140 Type 2 diabetes mellitus with diabetic neuropathy, unspecified: Secondary | ICD-10-CM | POA: Diagnosis not present

## 2019-10-03 DIAGNOSIS — I96 Gangrene, not elsewhere classified: Secondary | ICD-10-CM | POA: Diagnosis not present

## 2019-10-03 DIAGNOSIS — M79672 Pain in left foot: Secondary | ICD-10-CM | POA: Diagnosis not present

## 2019-10-03 DIAGNOSIS — I739 Peripheral vascular disease, unspecified: Secondary | ICD-10-CM | POA: Diagnosis not present

## 2019-10-03 DIAGNOSIS — L97512 Non-pressure chronic ulcer of other part of right foot with fat layer exposed: Secondary | ICD-10-CM | POA: Diagnosis not present

## 2019-10-03 NOTE — Telephone Encounter (Signed)
Patient went to the foot doctor recently and doctors were concerned about the wound on her foot. Daughter called to see if patient could be seen sooner.

## 2019-10-04 ENCOUNTER — Other Ambulatory Visit (INDEPENDENT_AMBULATORY_CARE_PROVIDER_SITE_OTHER): Payer: Self-pay | Admitting: Vascular Surgery

## 2019-10-04 DIAGNOSIS — N2581 Secondary hyperparathyroidism of renal origin: Secondary | ICD-10-CM | POA: Diagnosis not present

## 2019-10-04 DIAGNOSIS — I70245 Atherosclerosis of native arteries of left leg with ulceration of other part of foot: Secondary | ICD-10-CM

## 2019-10-04 DIAGNOSIS — Z95820 Peripheral vascular angioplasty status with implants and grafts: Secondary | ICD-10-CM

## 2019-10-04 DIAGNOSIS — D631 Anemia in chronic kidney disease: Secondary | ICD-10-CM | POA: Diagnosis not present

## 2019-10-04 DIAGNOSIS — N186 End stage renal disease: Secondary | ICD-10-CM | POA: Diagnosis not present

## 2019-10-04 DIAGNOSIS — Z992 Dependence on renal dialysis: Secondary | ICD-10-CM | POA: Diagnosis not present

## 2019-10-04 DIAGNOSIS — D509 Iron deficiency anemia, unspecified: Secondary | ICD-10-CM | POA: Diagnosis not present

## 2019-10-05 ENCOUNTER — Other Ambulatory Visit: Payer: Self-pay | Admitting: Family Medicine

## 2019-10-05 DIAGNOSIS — E1122 Type 2 diabetes mellitus with diabetic chronic kidney disease: Secondary | ICD-10-CM

## 2019-10-05 DIAGNOSIS — N186 End stage renal disease: Secondary | ICD-10-CM

## 2019-10-06 DIAGNOSIS — D509 Iron deficiency anemia, unspecified: Secondary | ICD-10-CM | POA: Diagnosis not present

## 2019-10-06 DIAGNOSIS — N186 End stage renal disease: Secondary | ICD-10-CM | POA: Diagnosis not present

## 2019-10-06 DIAGNOSIS — Z992 Dependence on renal dialysis: Secondary | ICD-10-CM | POA: Diagnosis not present

## 2019-10-06 DIAGNOSIS — N2581 Secondary hyperparathyroidism of renal origin: Secondary | ICD-10-CM | POA: Diagnosis not present

## 2019-10-06 DIAGNOSIS — D631 Anemia in chronic kidney disease: Secondary | ICD-10-CM | POA: Diagnosis not present

## 2019-10-08 ENCOUNTER — Ambulatory Visit (INDEPENDENT_AMBULATORY_CARE_PROVIDER_SITE_OTHER): Payer: Medicare Other

## 2019-10-08 ENCOUNTER — Ambulatory Visit (INDEPENDENT_AMBULATORY_CARE_PROVIDER_SITE_OTHER): Payer: Medicare Other | Admitting: Vascular Surgery

## 2019-10-08 ENCOUNTER — Encounter (INDEPENDENT_AMBULATORY_CARE_PROVIDER_SITE_OTHER): Payer: Self-pay | Admitting: Vascular Surgery

## 2019-10-08 ENCOUNTER — Other Ambulatory Visit: Payer: Self-pay

## 2019-10-08 VITALS — BP 160/69 | HR 66 | Resp 15 | Wt 167.6 lb

## 2019-10-08 DIAGNOSIS — Z95820 Peripheral vascular angioplasty status with implants and grafts: Secondary | ICD-10-CM

## 2019-10-08 DIAGNOSIS — I70245 Atherosclerosis of native arteries of left leg with ulceration of other part of foot: Secondary | ICD-10-CM

## 2019-10-08 DIAGNOSIS — I480 Paroxysmal atrial fibrillation: Secondary | ICD-10-CM | POA: Diagnosis not present

## 2019-10-08 DIAGNOSIS — I10 Essential (primary) hypertension: Secondary | ICD-10-CM | POA: Diagnosis not present

## 2019-10-08 DIAGNOSIS — T829XXS Unspecified complication of cardiac and vascular prosthetic device, implant and graft, sequela: Secondary | ICD-10-CM | POA: Diagnosis not present

## 2019-10-08 DIAGNOSIS — I7025 Atherosclerosis of native arteries of other extremities with ulceration: Secondary | ICD-10-CM

## 2019-10-08 DIAGNOSIS — N186 End stage renal disease: Secondary | ICD-10-CM

## 2019-10-08 DIAGNOSIS — E1159 Type 2 diabetes mellitus with other circulatory complications: Secondary | ICD-10-CM

## 2019-10-09 ENCOUNTER — Encounter (INDEPENDENT_AMBULATORY_CARE_PROVIDER_SITE_OTHER): Payer: Self-pay

## 2019-10-09 DIAGNOSIS — D631 Anemia in chronic kidney disease: Secondary | ICD-10-CM | POA: Diagnosis not present

## 2019-10-09 DIAGNOSIS — N186 End stage renal disease: Secondary | ICD-10-CM | POA: Diagnosis not present

## 2019-10-09 DIAGNOSIS — D509 Iron deficiency anemia, unspecified: Secondary | ICD-10-CM | POA: Diagnosis not present

## 2019-10-09 DIAGNOSIS — N2581 Secondary hyperparathyroidism of renal origin: Secondary | ICD-10-CM | POA: Diagnosis not present

## 2019-10-09 DIAGNOSIS — Z992 Dependence on renal dialysis: Secondary | ICD-10-CM | POA: Diagnosis not present

## 2019-10-10 ENCOUNTER — Encounter (INDEPENDENT_AMBULATORY_CARE_PROVIDER_SITE_OTHER): Payer: Self-pay | Admitting: Vascular Surgery

## 2019-10-10 DIAGNOSIS — N186 End stage renal disease: Secondary | ICD-10-CM | POA: Diagnosis not present

## 2019-10-10 DIAGNOSIS — Z992 Dependence on renal dialysis: Secondary | ICD-10-CM | POA: Diagnosis not present

## 2019-10-10 NOTE — Progress Notes (Signed)
MRN : [id number]  Stacy Pham is a 86 y.o. ([birth year]) female who presents with chief complaint of  Chief Complaint  Patient presents with  . Follow-up    [facility] 3week ultrasound follow up  .  History of Present Illness:   The patient is seen for evaluation of painful lower extremities and diminished pulses associated with ulceration of the both feet.  Recently on September 11, 2019 she had successful crosser atherectomy of the left tibioperoneal trunk and peroneal artery in association with angioplasty and stent placement of the SFA and popliteal arteries on the left.  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  Her toe has been stable.  She has been on antibiotics.  Today the patient notes there is a right fourth toe ulcer that has been present for multiple weeks and has not been improving.  It is very painful and has had some drainage.  No specific history of trauma noted by the patient.  The patient denies fever or chills.  the patient does have diabetes which has been difficult to control.  Patient notes prior to the ulcer developing the extremities were painful particularly with ambulation or activity and the discomfort is very consistent day today. Typically, the pain occurs at less than one block, progress is as activity continues to the point that the patient must stop walking. Resting including standing still for several minutes allowed resumption of the activity and the ability to walk a similar distance before stopping again. Uneven terrain and inclined shorten the distance. The pain has been progressive over the past several years.   The patient denies rest pain or dangling of an extremity off the side of the bed during the night for relief. No prior interventions or surgeries.  No history of back problems or DJD of the lumbar sacral spine.   The patient denies amaurosis fugax or recent TIA symptoms. There are no recent neurological changes noted. The patient denies history  of DVT, PE or superficial thrombophlebitis. The patient denies recent episodes of angina or shortness of breath.   ABI's Rt=0.78 (monophasic doppler signals) and Lt=0.91 (bi-triphasic PT doppler signal)  Current Meds  Medication Sig  . acetaminophen (TYLENOL 8 HOUR ARTHRITIS PAIN) 650 MG CR tablet Take 650 mg every 8 (eight) hours as needed by mouth for pain.  [name] allopurinol (ZYLOPRIM) 100 MG tablet TAKE 1 TABLET BY MOUTH EVERYDAY AT BEDTIME  . amiodarone (PACERONE) 200 MG tablet Take 200 mg by mouth every morning. Take 1 tablet (200 mg) by mouth once daily   . atorvastatin (LIPITOR) 20 MG tablet TAKE 1 TABLET BY MOUTH EVERY DAY IN THE EVENING  . blood glucose meter kit and supplies KIT Please dispense either One Touch or Bayer Contour She must have one of these machines since she is on peritoneal dialysis. Use up to four times daily as directed. (FOR ICD-9 250.00, 250.01).  . calcitRIOL (ROCALTROL) 0.25 MCG capsule Take 0.25 mcg by mouth daily.  . cinacalcet (SENSIPAR) 30 MG tablet Take 1 tablet (30 mg total) by mouth daily with supper. (Patient taking differently: Take 30 mg by mouth daily with supper. With largest meal)  . clopidogrel (PLAVIX) 75 MG tablet Take 1 tablet (75 mg total) by mouth daily.  . colchicine 0.6 MG tablet Take 1 tablet (0.6 mg total) by mouth daily as needed (for gout flare ups).  [name] ELIQUIS 2.5 MG TABS tablet TAKE 1 TABLET BY MOUTH TWICE A DAY  . ferric citrate (  AURYXIA) 1 GM 210 MG(Fe) tablet Take 210 mg by mouth 3 (three) times daily with meals.  . gabapentin (NEURONTIN) 100 MG capsule Take 2 capsules (200 mg total) by mouth 3 (three) times daily.  [name] HYDROcodone-acetaminophen (NORCO) 5-325 MG tablet Take 1 tablet by mouth every 6 (six) hours as needed for moderate pain or severe pain.  . metoprolol tartrate (LOPRESSOR) 25 MG tablet Take 0.5 tablets (12.5 mg total) by mouth 2 (two) times daily.  [name] neomycin-polymyxin-pramoxine (NEOSPORIN PLUS) 1 % cream Apply topically 2  (two) times daily.  . sevelamer carbonate (RENVELA) 0.8 g PACK packet   . traMADol (ULTRAM) 50 MG tablet Take 1 tablet (50 mg total) by mouth every 12 (twelve) hours as needed.    Past Medical History:  Diagnosis Date  . (HFpEF) heart failure with preserved ejection fraction ([place])    a. 10/2017 Echo: EF 60-65%, Gr1 DD, mild MR, mildly dil LA/RA. PASP 5mHg.  . Arthritis   . Back pain   . Bronchitis   . Cataract   . Diabetes mellitus without complication (HCC)    no meds currently  . [place]   . [place] ([place] renal disease) [place]    a. 2018 - initially on HD but then transitioned to nightly PD in 08/2017.  .Marland KitchenGout   . Hypertension   . PAF (paroxysmal atrial fibrillation) (HCC)    a. CHA2DS2VASc = 5-->eliquis 2.5 BID.    Past Surgical History:  Procedure Laterality Date  . AV FISTULA PLACEMENT Right 09/13/2018   Procedure: INSERTION OF ARTERIOVENOUS (AV) GORE-TEX GRAFT ARM;  Surgeon: SKatha Cabal MD;  Location: [facility] ORS;  Service: Vascular;  Laterality: Right;  . CAPD INSERTION N/A 06/08/2017   Procedure: LAPAROSCOPIC INSERTION CONTINUOUS AMBULATORY PERITONEAL DIALYSIS  (CAPD) CATHETER;  Surgeon: SKatha Cabal MD;  Location: [facility] ORS;  Service: Vascular;  Laterality: N/A;  . CATARACT EXTRACTION, BILATERAL Bilateral   . DIALYSIS/PERMA CATHETER INSERTION N/A 03/18/2017   Procedure: DIALYSIS/PERMA CATHETER INSERTION;  Surgeon: SKatha Cabal MD;  [place];  Service: Cardiovascular;  Laterality: N/A;  . DIALYSIS/PERMA CATHETER REMOVAL N/A 10/20/2017   Procedure: DIALYSIS/PERMA CATHETER REMOVAL;  Surgeon: DAlgernon Huxley MD;  [place];  Service: Cardiovascular;  Laterality: N/A;  . EYE SURGERY    . LOWER EXTREMITY ANGIOGRAPHY Left 09/11/2019   Procedure: LOWER EXTREMITY ANGIOGRAPHY;  Surgeon: SKatha Cabal MD;  [place] [facility];  Service: Cardiovascular;  Laterality: Left;  . REMOVAL OF A DIALYSIS  CATHETER N/A 02/23/2019   Procedure: REMOVAL OF A DIALYSIS CATHETER ( PD CATH);  Surgeon: SKatha Cabal MD;  Location: [facility] ORS;  Service: Vascular;  Laterality: N/A;    Social History Social History   Tobacco Use  . Smoking status: Never Smoker  . Smokeless tobacco: Never Used  Substance Use Topics  . Alcohol use: No  . Drug use: No    Family History Family History  Problem Relation Age of Onset  . Hypertension Father     No Known Allergies   REVIEW OF SYSTEMS (Negative unless checked)  Constitutional: '[]' Weight loss  '[]' Fever  '[]' Chills Cardiac: '[]' Chest pain   '[]' Chest pressure   '[]' Palpitations   '[]' Shortness of breath when laying flat   '[]' Shortness of breath with exertion. Vascular:  '[]' Pain in legs with walking   '[x]' Pain in legs at rest  '[]' History of DVT   '[]' Phlebitis   '[]' Swelling in legs   '[]' Varicose veins   [  x]Non-healing ulcers Pulmonary:   '[]' Uses home oxygen   '[]' Productive cough   '[]' Hemoptysis   '[]' Wheeze  '[]' COPD   '[]' Asthma Neurologic:  '[]' Dizziness   '[]' Seizures   '[]' History of stroke   '[]' History of TIA  '[]' Aphasia   '[]' Vissual changes   '[]' Weakness or numbness in arm   '[]' Weakness or numbness in leg Musculoskeletal:   '[]' Joint swelling   '[]' Joint pain   '[]' Low back pain Hematologic:  '[]' Easy bruising  '[]' Easy bleeding   '[]' Hypercoagulable state   '[]' Anemic Gastrointestinal:  '[]' Diarrhea   '[]' Vomiting  '[]' Gastroesophageal reflux/heartburn   '[]' Difficulty swallowing. Genitourinary:  '[x]' Chronic kidney disease   '[]' Difficult urination  '[]' Frequent urination   '[]' Blood in urine Skin:  '[]' Rashes   '[x]' Ulcers  Psychological:  '[]' History of anxiety   '[]'  History of major depression.  Physical Examination  Vitals:   10/08/19 1030  BP: (!) 160/69  Pulse: 66  Resp: 15  Weight: 167 lb 9.6 oz (76 kg)   Body mass index is 29.69 kg/m. Gen: WD/WN, NAD Head: [place]/AT, No temporalis wasting.  Ear/Nose/Throat: Hearing grossly intact, nares w/o erythema or drainage Eyes: PER, EOMI, sclera nonicteric.    Neck: Supple, no large masses.   Pulmonary:  Good air movement, no audible wheezing bilaterally, no use of accessory muscles.  Cardiac: RRR, no JVD Vascular: Noninfected kissing ulcer lateral aspect of the right fourth toe.  Dry gangrene to the proximal phalanx of the left great toe.  Right arm break X graft good thrill good bruit Vessel Right Left  Radial Palpable Palpable  PT Not Palpable Trace Palpable  DP Not Palpable Not Palpable  Gastrointestinal: Non-distended. No guarding/no peritoneal signs.  Musculoskeletal: M/S 5/5 throughout.  No deformity or atrophy.  Neurologic: CN 2-12 intact. Symmetrical.  Speech is fluent. Motor exam as listed above. Psychiatric: Judgment intact, Mood & affect appropriate for pt's clinical situation. Dermatologic: + ulcers noted.  No changes consistent with cellulitis.   CBC Lab Results  Component Value Date   WBC 9.4 09/14/2019   HGB 11.1 (L) 09/14/2019   HCT 34.6 (L) 09/14/2019   MCV 96.9 09/14/2019   PLT 311.0 09/14/2019    BMET    Component Value Date/Time   NA 139 09/14/2019 1153   K 4.9 09/14/2019 1153   CL 98 09/14/2019 1153   CO2 29 09/14/2019 1153   GLUCOSE 114 (H) 09/14/2019 1153   BUN 30 (H) 09/14/2019 1153   CREATININE 6.97 (HH) 09/14/2019 1153   CALCIUM 9.8 09/14/2019 1153   GFRNONAA 2 (L) 06/28/2019 0941   GFRAA 2 (L) 06/28/2019 0941   CrCl cannot be calculated (Patient's most recent lab result is older than the maximum 21 days allowed.).  COAG Lab Results  Component Value Date   INR 1.5 (H) 02/16/2019   INR 1.45 08/21/2018   INR 1.43 06/16/2018    Radiology PERIPHERAL VASCULAR CATHETERIZATION  Result Date: 09/11/2019 See op note    Assessment/Plan 1. Atherosclerosis of native arteries of the extremities with ulceration ([place])  Recommend:  The patient has evidence of severe atherosclerotic changes of both lower extremities associated with ulceration and tissue loss of the right foot.  This represents a limb  threatening ischemia and places the patient at the risk for right limb loss.  Patient should undergo angiography of the right lower extremities with the hope for intervention for limb salvage.  The risks and benefits as well as the alternative therapies was discussed in detail with the patient.  All questions were answered.  Patient agrees to  proceed with right leg angiography.  With respect to follow-up regarding the left lower extremity revascularization this appears to been successful.  Her ABI is now significantly improved and her posterior tibial Doppler signal is by to triphasic.  This should provide adequate perfusion for wound healing and I concur with Dr. Caryl Comes that the course of action to minimize further complications in particular infection would be a first ray amputation at the transmetatarsal level.  The patient is resistant to this and does not consent to this at this time.  We will move forward with revascularization the right and continue to treat the left great toe with Betadine on a daily basis.  Incidentally lambs will was recommended to create separation between the toes and prevent continued pressure ulcer position.  The patient will follow up with me in the office after the procedure.    2. Complication from renal dialysis device, sequela Recommend:  The patient is doing well and currently has adequate dialysis access. The patient's dialysis center is not reporting any access issues. Flow pattern is stable when compared to the prior ultrasound.  The patient should have a duplex ultrasound of the dialysis access in 6 months. The patient will follow-up with me in the office after each ultrasound   3. ESRD (end stage renal disease) ([place]) At the present time the patient has adequate dialysis access.  Continue hemodialysis as ordered without interruption.  Avoid nephrotoxic medications and dehydration.  Further plans per nephrology  4. Type 2 diabetes mellitus with  vascular disease ([place]) Continue hypoglycemic medications as already ordered, these medications have been reviewed and there are no changes at this time.  Hgb A1C to be monitored as already arranged by primary service   5. Paroxysmal atrial fibrillation (HCC) Continue antiarrhythmia medications as already ordered, these medications have been reviewed and there are no changes at this time.  Continue anticoagulation as ordered by Cardiology Service   6. Essential hypertension Continue antihypertensive medications as already ordered, these medications have been reviewed and there are no changes at this time.     Hortencia Pilar, MD  10/10/2019 7:46 AM

## 2019-10-11 DIAGNOSIS — D509 Iron deficiency anemia, unspecified: Secondary | ICD-10-CM | POA: Diagnosis not present

## 2019-10-11 DIAGNOSIS — N2581 Secondary hyperparathyroidism of renal origin: Secondary | ICD-10-CM | POA: Diagnosis not present

## 2019-10-11 DIAGNOSIS — D631 Anemia in chronic kidney disease: Secondary | ICD-10-CM | POA: Diagnosis not present

## 2019-10-11 DIAGNOSIS — Z992 Dependence on renal dialysis: Secondary | ICD-10-CM | POA: Diagnosis not present

## 2019-10-11 DIAGNOSIS — N186 End stage renal disease: Secondary | ICD-10-CM | POA: Diagnosis not present

## 2019-10-13 DIAGNOSIS — D631 Anemia in chronic kidney disease: Secondary | ICD-10-CM | POA: Diagnosis not present

## 2019-10-13 DIAGNOSIS — N186 End stage renal disease: Secondary | ICD-10-CM | POA: Diagnosis not present

## 2019-10-13 DIAGNOSIS — Z992 Dependence on renal dialysis: Secondary | ICD-10-CM | POA: Diagnosis not present

## 2019-10-13 DIAGNOSIS — N2581 Secondary hyperparathyroidism of renal origin: Secondary | ICD-10-CM | POA: Diagnosis not present

## 2019-10-13 DIAGNOSIS — D509 Iron deficiency anemia, unspecified: Secondary | ICD-10-CM | POA: Diagnosis not present

## 2019-10-15 ENCOUNTER — Other Ambulatory Visit
Admission: RE | Admit: 2019-10-15 | Discharge: 2019-10-15 | Disposition: A | Discharge status: Home / Self Care | Payer: Medicare Other | Source: Ambulatory Visit | Attending: Vascular Surgery | Admitting: Vascular Surgery

## 2019-10-15 ENCOUNTER — Other Ambulatory Visit: Payer: Self-pay

## 2019-10-15 ENCOUNTER — Other Ambulatory Visit (INDEPENDENT_AMBULATORY_CARE_PROVIDER_SITE_OTHER): Payer: Self-pay | Admitting: Vascular Surgery

## 2019-10-15 ENCOUNTER — Telehealth (INDEPENDENT_AMBULATORY_CARE_PROVIDER_SITE_OTHER): Payer: Self-pay

## 2019-10-15 DIAGNOSIS — Z01812 Encounter for preprocedural laboratory examination: Secondary | ICD-10-CM | POA: Insufficient documentation

## 2019-10-15 DIAGNOSIS — Z20822 Contact with and (suspected) exposure to covid-19: Secondary | ICD-10-CM | POA: Diagnosis not present

## 2019-10-15 DIAGNOSIS — I7025 Atherosclerosis of native arteries of other extremities with ulceration: Secondary | ICD-10-CM

## 2019-10-15 MED ORDER — HYDROCODONE-ACETAMINOPHEN 5-325 MG PO TABS: 1.0000 | ORAL_TABLET | Freq: Four times a day (QID) | ORAL | 0 refills | Status: DC | PRN

## 2019-10-15 NOTE — Telephone Encounter (Signed)
Patient daughter called informing that she received the letter for the procedure that is schedule for 10/17/19 and was asking should her mother be off both blood thinners or the just the Eliquis. I spoke with Dr Delana Meyer and he informed that the patient should be off the Eliquis but can stay on the Clopidgrel. The daughter ask if her mother can receive refill for Hydrocodone due to having pain and I made her aware that I spoke with Dr Delana Meyer and Rx has been sent.

## 2019-10-15 NOTE — Progress Notes (Signed)
The patient is set up for an angiogram and has rest pain.  I will refill her Hydrocodon

## 2019-10-16 ENCOUNTER — Other Ambulatory Visit (INDEPENDENT_AMBULATORY_CARE_PROVIDER_SITE_OTHER): Payer: Self-pay | Admitting: Nurse Practitioner

## 2019-10-16 DIAGNOSIS — N186 End stage renal disease: Secondary | ICD-10-CM | POA: Diagnosis not present

## 2019-10-16 DIAGNOSIS — Z992 Dependence on renal dialysis: Secondary | ICD-10-CM | POA: Diagnosis not present

## 2019-10-16 DIAGNOSIS — D509 Iron deficiency anemia, unspecified: Secondary | ICD-10-CM | POA: Diagnosis not present

## 2019-10-16 DIAGNOSIS — D631 Anemia in chronic kidney disease: Secondary | ICD-10-CM | POA: Diagnosis not present

## 2019-10-16 DIAGNOSIS — N2581 Secondary hyperparathyroidism of renal origin: Secondary | ICD-10-CM | POA: Diagnosis not present

## 2019-10-16 LAB — SARS CORONAVIRUS 2 (TAT 6-24 HRS): SARS Coronavirus 2: NEGATIVE

## 2019-10-17 ENCOUNTER — Ambulatory Visit
Admission: RE | Admit: 2019-10-17 | Discharge: 2019-10-17 | Disposition: A | Discharge status: Home / Self Care | Payer: Medicare Other | Attending: Vascular Surgery | Admitting: Vascular Surgery

## 2019-10-17 ENCOUNTER — Encounter: Payer: Self-pay | Admitting: Vascular Surgery

## 2019-10-17 ENCOUNTER — Encounter
Admission: RE | Disposition: A | Discharge status: Home / Self Care | Payer: Self-pay | Source: Home / Self Care | Attending: Vascular Surgery

## 2019-10-17 ENCOUNTER — Other Ambulatory Visit: Payer: Self-pay

## 2019-10-17 DIAGNOSIS — I70235 Atherosclerosis of native arteries of right leg with ulceration of other part of foot: Secondary | ICD-10-CM | POA: Diagnosis not present

## 2019-10-17 DIAGNOSIS — L97519 Non-pressure chronic ulcer of other part of right foot with unspecified severity: Secondary | ICD-10-CM | POA: Insufficient documentation

## 2019-10-17 DIAGNOSIS — E1136 Type 2 diabetes mellitus with diabetic cataract: Secondary | ICD-10-CM | POA: Diagnosis not present

## 2019-10-17 DIAGNOSIS — Z7901 Long term (current) use of anticoagulants: Secondary | ICD-10-CM | POA: Insufficient documentation

## 2019-10-17 DIAGNOSIS — Z7902 Long term (current) use of antithrombotics/antiplatelets: Secondary | ICD-10-CM | POA: Insufficient documentation

## 2019-10-17 DIAGNOSIS — I5030 Unspecified diastolic (congestive) heart failure: Secondary | ICD-10-CM | POA: Diagnosis not present

## 2019-10-17 DIAGNOSIS — I70239 Atherosclerosis of native arteries of right leg with ulceration of unspecified site: Secondary | ICD-10-CM | POA: Diagnosis not present

## 2019-10-17 DIAGNOSIS — I480 Paroxysmal atrial fibrillation: Secondary | ICD-10-CM | POA: Insufficient documentation

## 2019-10-17 DIAGNOSIS — E11621 Type 2 diabetes mellitus with foot ulcer: Secondary | ICD-10-CM | POA: Diagnosis not present

## 2019-10-17 DIAGNOSIS — E1122 Type 2 diabetes mellitus with diabetic chronic kidney disease: Secondary | ICD-10-CM | POA: Insufficient documentation

## 2019-10-17 DIAGNOSIS — M1990 Unspecified osteoarthritis, unspecified site: Secondary | ICD-10-CM | POA: Insufficient documentation

## 2019-10-17 DIAGNOSIS — Z992 Dependence on renal dialysis: Secondary | ICD-10-CM | POA: Insufficient documentation

## 2019-10-17 DIAGNOSIS — L97909 Non-pressure chronic ulcer of unspecified part of unspecified lower leg with unspecified severity: Secondary | ICD-10-CM

## 2019-10-17 DIAGNOSIS — M109 Gout, unspecified: Secondary | ICD-10-CM | POA: Insufficient documentation

## 2019-10-17 DIAGNOSIS — E1151 Type 2 diabetes mellitus with diabetic peripheral angiopathy without gangrene: Secondary | ICD-10-CM | POA: Insufficient documentation

## 2019-10-17 DIAGNOSIS — I130 Hypertensive heart and chronic kidney disease with heart failure and stage 1 through stage 4 chronic kidney disease, or unspecified chronic kidney disease: Secondary | ICD-10-CM | POA: Diagnosis not present

## 2019-10-17 DIAGNOSIS — Z8249 Family history of ischemic heart disease and other diseases of the circulatory system: Secondary | ICD-10-CM | POA: Insufficient documentation

## 2019-10-17 DIAGNOSIS — Z95828 Presence of other vascular implants and grafts: Secondary | ICD-10-CM | POA: Insufficient documentation

## 2019-10-17 DIAGNOSIS — Z79899 Other long term (current) drug therapy: Secondary | ICD-10-CM | POA: Insufficient documentation

## 2019-10-17 DIAGNOSIS — N186 End stage renal disease: Secondary | ICD-10-CM | POA: Insufficient documentation

## 2019-10-17 DIAGNOSIS — I70299 Other atherosclerosis of native arteries of extremities, unspecified extremity: Secondary | ICD-10-CM

## 2019-10-17 HISTORY — PX: LOWER EXTREMITY ANGIOGRAPHY: CATH118251

## 2019-10-17 LAB — POTASSIUM (ARMC VASCULAR LAB ONLY): Potassium (ARMC vascular lab): 4.7 (ref 3.5–5.1)

## 2019-10-17 LAB — GLUCOSE, CAPILLARY
Glucose-Capillary: 104 mg/dL — ABNORMAL HIGH (ref 70–99)
Glucose-Capillary: 93 mg/dL (ref 70–99)

## 2019-10-17 SURGERY — LOWER EXTREMITY ANGIOGRAPHY
Anesthesia: Moderate Sedation | Site: Leg Lower | Laterality: Right

## 2019-10-17 MED ORDER — ACETAMINOPHEN 325 MG PO TABS: 650.0000 mg | ORAL_TABLET | ORAL | Status: DC | PRN

## 2019-10-17 MED ORDER — CEFAZOLIN SODIUM-DEXTROSE 1-4 GM/50ML-% IV SOLN
INTRAVENOUS | Status: AC
  Filled 2019-10-17: qty 50

## 2019-10-17 MED ORDER — SODIUM CHLORIDE 0.9% FLUSH: 3.0000 mL | Freq: Two times a day (BID) | INTRAVENOUS | Status: DC

## 2019-10-17 MED ORDER — METHYLPREDNISOLONE SODIUM SUCC 125 MG IJ SOLR: 125.0000 mg | Freq: Once | INTRAMUSCULAR | Status: DC | PRN

## 2019-10-17 MED ORDER — SODIUM CHLORIDE 0.9 % IV SOLN: INTRAVENOUS | Status: DC

## 2019-10-17 MED ORDER — HEPARIN SODIUM (PORCINE) 1000 UNIT/ML IJ SOLN
INTRAMUSCULAR | Status: AC
  Filled 2019-10-17: qty 1

## 2019-10-17 MED ORDER — MIDAZOLAM HCL 2 MG/2ML IJ SOLN
INTRAMUSCULAR | Status: AC
  Filled 2019-10-17: qty 2

## 2019-10-17 MED ORDER — MIDAZOLAM HCL 2 MG/2ML IJ SOLN
INTRAMUSCULAR | Status: DC | PRN
  Administered 2019-10-17 (×3): 1 mg via INTRAVENOUS
  Administered 2019-10-17: 2 mg via INTRAVENOUS

## 2019-10-17 MED ORDER — MIDAZOLAM HCL 2 MG/ML PO SYRP: 8.0000 mg | ORAL_SOLUTION | Freq: Once | ORAL | Status: DC | PRN

## 2019-10-17 MED ORDER — FENTANYL CITRATE (PF) 100 MCG/2ML IJ SOLN
INTRAMUSCULAR | Status: DC | PRN
  Administered 2019-10-17: 25 ug via INTRAVENOUS
  Administered 2019-10-17: 50 ug via INTRAVENOUS
  Administered 2019-10-17 (×2): 25 ug via INTRAVENOUS

## 2019-10-17 MED ORDER — SODIUM CHLORIDE 0.9% FLUSH: 3.0000 mL | INTRAVENOUS | Status: DC | PRN

## 2019-10-17 MED ORDER — MIDAZOLAM HCL 5 MG/5ML IJ SOLN
INTRAMUSCULAR | Status: AC
  Filled 2019-10-17: qty 5

## 2019-10-17 MED ORDER — MORPHINE SULFATE (PF) 4 MG/ML IV SOLN: 2.0000 mg | INTRAVENOUS | Status: DC | PRN

## 2019-10-17 MED ORDER — FENTANYL CITRATE (PF) 100 MCG/2ML IJ SOLN
INTRAMUSCULAR | Status: AC
  Filled 2019-10-17: qty 2

## 2019-10-17 MED ORDER — IODIXANOL 320 MG/ML IV SOLN
INTRAVENOUS | Status: DC | PRN
  Administered 2019-10-17: 80 mL via INTRA_ARTERIAL

## 2019-10-17 MED ORDER — HEPARIN SODIUM (PORCINE) 1000 UNIT/ML IJ SOLN
INTRAMUSCULAR | Status: DC | PRN
  Administered 2019-10-17: 5000 [IU] via INTRAVENOUS

## 2019-10-17 MED ORDER — OXYCODONE HCL 5 MG PO TABS: 5.0000 mg | ORAL_TABLET | ORAL | Status: DC | PRN

## 2019-10-17 MED ORDER — HYDRALAZINE HCL 20 MG/ML IJ SOLN: 5.0000 mg | INTRAMUSCULAR | Status: DC | PRN

## 2019-10-17 MED ORDER — FAMOTIDINE 20 MG PO TABS: 40.0000 mg | ORAL_TABLET | Freq: Once | ORAL | Status: DC | PRN

## 2019-10-17 MED ORDER — ONDANSETRON HCL 4 MG/2ML IJ SOLN: 4.0000 mg | Freq: Four times a day (QID) | INTRAMUSCULAR | Status: DC | PRN

## 2019-10-17 MED ORDER — CEFAZOLIN SODIUM-DEXTROSE 1-4 GM/50ML-% IV SOLN
1.0000 g | Freq: Once | INTRAVENOUS | Status: AC
  Administered 2019-10-17: 1 g via INTRAVENOUS

## 2019-10-17 MED ORDER — HYDROMORPHONE HCL 1 MG/ML IJ SOLN: 1.0000 mg | Freq: Once | INTRAMUSCULAR | Status: DC | PRN

## 2019-10-17 MED ORDER — SODIUM CHLORIDE 0.9 % IV SOLN: 250.0000 mL | INTRAVENOUS | Status: DC | PRN

## 2019-10-17 MED ORDER — DIPHENHYDRAMINE HCL 50 MG/ML IJ SOLN: 50.0000 mg | Freq: Once | INTRAMUSCULAR | Status: DC | PRN

## 2019-10-17 MED ORDER — LABETALOL HCL 5 MG/ML IV SOLN: 10.0000 mg | INTRAVENOUS | Status: DC | PRN

## 2019-10-17 SURGICAL SUPPLY — 30 items
BALLN LUTONIX DCB 5X100X130 (BALLOONS) ×3
BALLN LUTONIX DCB 6X100X130 (BALLOONS) ×3
BALLN STERLING OTW 2X40X150 (BALLOONS) ×3
BALLN ULTRVRSE 018 2.5X150X150 (BALLOONS) ×3
BALLN ULTRVRSE 3X100X130C (BALLOONS) ×3
BALLOON LUTONIX DCB 5X100X130 (BALLOONS) IMPLANT
BALLOON LUTONIX DCB 6X100X130 (BALLOONS) IMPLANT
BALLOON STERLING OTW 2X40X150 (BALLOONS) IMPLANT
BALLOON ULTRVRSE 3X100X130C (BALLOONS) IMPLANT
BALLOON ULTRVS 018 2.5X150X150 (BALLOONS) IMPLANT
CATH BEACON 5 .035 65 RIM TIP (CATHETERS) ×2 IMPLANT
CATH CROSSER S6 154CM (CATHETERS) ×2 IMPLANT
CATH CXI SUPP 2.6F 150 ANG (CATHETERS) ×2 IMPLANT
CATH USHER TPER 130CM (CATHETERS) ×2 IMPLANT
CATH VERT 5FR 125CM (CATHETERS) ×2 IMPLANT
DEVICE PRESTO INFLATION (MISCELLANEOUS) ×2 IMPLANT
DEVICE STARCLOSE SE CLOSURE (Vascular Products) ×2 IMPLANT
GLIDEWIRE ADV .035X260CM (WIRE) ×2 IMPLANT
GUIDEWIRE PFTE-COATED .018X300 (WIRE) ×2 IMPLANT
KIT FLOWMATE PROCEDURAL (KITS) ×2 IMPLANT
NDL ENTRY 21GA 7CM ECHOTIP (NEEDLE) IMPLANT
NEEDLE ENTRY 21GA 7CM ECHOTIP (NEEDLE) ×3 IMPLANT
PACK ANGIOGRAPHY (CUSTOM PROCEDURE TRAY) ×3 IMPLANT
SET INTRO CAPELLA COAXIAL (SET/KITS/TRAYS/PACK) ×2 IMPLANT
SHEATH BRITE TIP 5FRX11 (SHEATH) ×2 IMPLANT
SHEATH RAABE 6FR (SHEATH) ×2 IMPLANT
STENT LIFESTENT 5F 6X100X135 (Permanent Stent) ×2 IMPLANT
TUBE CONN 8.8X1320 FR HP M-F (CONNECTOR) ×4 IMPLANT
WIRE G V18X300CM (WIRE) ×2 IMPLANT
WIRE J 3MM .035X145CM (WIRE) ×2 IMPLANT

## 2019-10-17 NOTE — OR Nursing (Signed)
Daughter Rose Phi called estimated pick up time 1600. Reviewed discharge instructions

## 2019-10-17 NOTE — Discharge Instructions (Signed)
Angiogram, Care After This sheet gives you information about how to care for yourself after your procedure. Your health care provider may also give you more specific instructions. If you have problems or questions, contact your health care provider. What can I expect after the procedure? After the procedure, it is common to have bruising and tenderness at the catheter insertion area. Follow these instructions at home: Insertion site care  Follow instructions from your health care provider about how to take care of your insertion site. Make sure you: ? Wash your hands with soap and water before you change your bandage (dressing). If soap and water are not available, use hand sanitizer. ? Change your dressing as told by your health care provider. ? Leave stitches (sutures), skin glue, or adhesive strips in place. These skin closures may need to stay in place for 2 weeks or longer. If adhesive strip edges start to loosen and curl up, you may trim the loose edges. Do not remove adhesive strips completely unless your health care provider tells you to do that.  Do not take baths, swim, or use a hot tub until your health care provider approves.  You may shower 24-48 hours after the procedure or as told by your health care provider. ? Gently wash the site with plain soap and water. ? Pat the area dry with a clean towel. ? Do not rub the site. This may cause bleeding.  Do not apply powder or lotion to the site. Keep the site clean and dry.  Check your insertion site every day for signs of infection. Check for: ? Redness, swelling, or pain. ? Fluid or blood. ? Warmth. ? Pus or a bad smell. Activity  Rest as told by your health care provider, usually for 1-2 days.  Do not lift anything that is heavier than 10 lbs. (4.5 kg) or as told by your health care provider.  Do not drive for 24 hours if you were given a medicine to help you relax (sedative).  Do not drive or use heavy machinery while  taking prescription pain medicine. General instructions   Return to your normal activities as told by your health care provider, usually in about a week. Ask your health care provider what activities are safe for you.  If the catheter site starts bleeding, lie flat and put pressure on the site. If the bleeding does not stop, get help right away. This is a medical emergency.  Drink enough fluid to keep your urine clear or pale yellow. This helps flush the contrast dye from your body.  Take over-the-counter and prescription medicines only as told by your health care provider.  Keep all follow-up visits as told by your health care provider. This is important. Contact a health care provider if:  You have a fever or chills.  You have redness, swelling, or pain around your insertion site.  You have fluid or blood coming from your insertion site.  The insertion site feels warm to the touch.  You have pus or a bad smell coming from your insertion site.  You have bruising around the insertion site.  You notice blood collecting in the tissue around the catheter site (hematoma). The hematoma may be painful to the touch. Get help right away if:  You have severe pain at the catheter insertion area.  The catheter insertion area swells very fast.  The catheter insertion area is bleeding, and the bleeding does not stop when you hold steady pressure on the area.    The area near or just beyond the catheter insertion site becomes pale, cool, tingly, or numb. These symptoms may represent a serious problem that is an emergency. Do not wait to see if the symptoms will go away. Get medical help right away. Call your local emergency services (911 in the [place] Do not drive yourself to the hospital. Summary  After the procedure, it is common to have bruising and tenderness at the catheter insertion area.  After the procedure, it is important to rest and drink plenty of fluids.  Do not take baths,  swim, or use a hot tub until your health care provider says it is okay to do so. You may shower 24-48 hours after the procedure or as told by your health care provider.  If the catheter site starts bleeding, lie flat and put pressure on the site. If the bleeding does not stop, get help right away. This is a medical emergency. This information is not intended to replace advice given to you by your health care provider. Make sure you discuss any questions you have with your health care provider. Document Revised: 06/10/2017 Document Reviewed: 06/02/2016 Elsevier Patient Education  2020 [employer]  

## 2019-10-17 NOTE — Op Note (Signed)
[facility] Percutaneous Study/Intervention Procedural Note   Date of Surgery: 10/17/2019  Surgeon:  Katha Cabal, MD.  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 Introduction catheter into right lower extremity 3rd order catheter placement  2. Contrast injection right lower extremity for distal runoff   3. Crosser atherectomy of the peroneal artery 4. Percutaneous transluminal angioplasty peroneal artery with a 2.5 mm Ultraverse balloon             5.  Percutaneous transluminal angioplasty and stent placement of the SFA and popliteal arteries with a 6 mm x 100 mm life stent                      6.   Star close closure left common femoral arteriotomy                Anesthesia: Conscious sedation was administered under my direct supervision by the interventional radiology RN. IV Versed plus fentanyl were utilized. Continuous ECG, pulse oximetry and blood pressure was monitored throughout the entire procedure. Conscious sedation was for a total of 130 minutes.  Sheath: 6 French Raby left common femoral retrograde  Contrast: 80 cc  Fluoroscopy Time: 12.2 minutes  Indications: Stacy Pham presents with atherosclerotic occlusive disease bilateral lower extremities. The patient has developed ulceration of the soft tissues of the right fourth toe. This places the patient at high risk for limb loss and amputation. The risks and benefits are reviewed all questions answered patient agrees to proceed.  Procedure: Stacy Pham is a 86 y.o. y.o. female who was identified and appropriate procedural time out was performed. The patient was then placed supine on the table and prepped and draped in the usual sterile fashion.   Ultrasound was placed in the sterile  sleeve and the left groin was evaluated the left common femoral artery was echolucent and pulsatile indicating patency.  Image was recorded for the permanent record and under real-time visualization a microneedle was inserted into the common femoral artery microwire followed by a micro-sheath.  A J-wire was then advanced through the micro-sheath and a  5 [place] sheath was then inserted over a J-wire. J-wire was then advanced and a 5 French rim catheter was positioned at the level of the aortic bifurcation and a LAO view of the pelvis was obtained.  Subsequently a rim catheter with the stiff angle Glidewire was used to cross the aortic bifurcation the catheter wire were advanced down into the right distal external iliac artery. Oblique view of the femoral bifurcation was then obtained and subsequently the wire was reintroduced and the pigtail catheter negotiated into the SFA representing third order catheter placement. Distal runoff was then performed.  Diagnostic interpretation: The abdominal aorta as well as the bilateral common and external iliac arteries are patent without hemodynamically significant stenosis.  This was determined on her recent angiogram to treat the left lower extremity.  Therefore abdominal aortogram was not repeated.  The right common femoral profunda femoris and proximal SFA demonstrate diffuse atherosclerotic disease but there are no hemodynamically significant stenoses.  In the distal right SFA and above-knee popliteal there is severe disease with a subtotal occlusion in association with tandem greater than 70% stenoses.  The mid and distal popliteal again demonstrate diffuse atherosclerotic changes but no hemodynamically significant stenosis.  The trifurcation is profoundly diseased with occlusion of the anterior tibial  approximately 1 cm after its origin.  The tibioperoneal trunk is extensively diseased with greater than 90% stenosis proximally and occlusion of the tibioperoneal  trunk distally.  The posterior tibial and peroneal are both occluded from their origins distally.  The peroneal reconstitutes at its mid level and remains patent down to the ankle where collateralizes extensively to the foot.  This is the dominant runoff to the toes.  The posterior tibial remains occluded throughout its entire course and does not reconstitute.  The anterior tibial remains occluded throughout its entire course.  There is reconstitution of the dorsalis pedis via peroneal collaterals.  5000 units of heparin was then given and allowed to circulate and a 6 [place] Raby sheath was advanced up and over the bifurcation and positioned in the common femoral artery.  Wire and Kumpe catheter were then used to negotiate the SFA stenosis and the catheter was then positioned in the distal popliteal where magnified imaging of the trifurcation was performed later in the case.  Having crossed the extensive SFA popliteal stenoses I first use a 3 mm x 100 mm Ultraverse balloon.  Is inflated to 10 atm for 1 minute.  Next a 5 mm x 100 mm Lutonix drug-eluting balloon was advanced across the lesion inflated to 12 atm for 1 minute.  Follow-up imaging demonstrated greater than 50% residual stenosis particularly at the lesion that was a subtotal occlusion and therefore a 6 mm x 100 mm life stent was deployed and postdilated with a 5 mm Lutonix balloon.  Follow-up imaging now demonstrated wide patency of the SFA popliteal with less than 5% residual stenosis.  Attention was then turned to the trifurcation.  Kumpe catheter and wire were then negotiated into the cul-de-sac of the tibioperoneal trunk.  The S6 Crosser catheter was then prepped on the field and a straight Usher catheter was advanced into the and of the cul-de-sac of the tibioperoneal trunk under magnified imaging. Using the Crosser catheter the occlusion of the distal tibioperoneal trunk and proximal peroneal was negotiated.  Injection of contrast confirmed  intraluminal positioning.  Initially a 2 mm x 40 mm balloon was used to angioplasty the occlusion.  Next a 2.5 mm x 150 mm ultra versed balloon was advanced across the proximal peroneal and the tibioperoneal trunk.  Inflation was to 14 atm for 2 minutes.  Follow-up imaging now demonstrated wide patency of the tibioperoneal trunk peroneal with less than 10% residual stenosis.  There is preservation of distal runoff.   After review of these images the sheath is pulled into the left external iliac oblique of the common femoral is obtained and a Star close device deployed. There no immediate Complications.  Findings:  The abdominal aorta as well as the bilateral common and external iliac arteries are patent without hemodynamically significant stenosis.  This was determined on her recent angiogram to treat the left lower extremity.  Therefore abdominal aortogram was not repeated.  The right common femoral profunda femoris and proximal SFA demonstrate diffuse atherosclerotic disease but there are no hemodynamically significant stenoses.  In the distal right SFA and above-knee popliteal there is severe disease with a subtotal occlusion in association with tandem greater than 70% stenoses.  The mid and distal popliteal again demonstrate diffuse atherosclerotic changes but no hemodynamically significant stenosis.  The trifurcation is profoundly diseased with occlusion of the anterior tibial approximately 1 cm after its origin.  The tibioperoneal trunk is extensively diseased with greater than 90% stenosis proximally and occlusion of the tibioperoneal  trunk distally.  The posterior tibial and peroneal are both occluded from their origins distally.  The peroneal reconstitutes at its mid level and remains patent down to the ankle where collateralizes extensively to the foot.  This is the dominant runoff to the toes.  The posterior tibial remains occluded throughout its entire course and does not reconstitute.  The  anterior tibial remains occluded throughout its entire course.  There is reconstitution of the dorsalis pedis via peroneal collaterals.  Angioplasty and stent placement of the SFA and popliteal with a Life stent and  Lutonix balloon shows an excellent result with less than 5% residual stenosis.  Following crosser atherectomy there is successful recanalization of the peroneal.  Following angioplasty to 2.5 mm the tibial peroneal trunk and peroneal now is widely patent in-line flow and looks quite nice with less than 10% residual stenosis.   Disposition: Patient was taken to the recovery room in stable condition having tolerated the procedure well.  Stacy Pham 10/17/2019,2:09 PM

## 2019-10-17 NOTE — H&P (Signed)
[facility] History & Physical Update  The patient was interviewed and re-examined.  The patient's previous History and Physical has been reviewed and is unchanged.  There is no change in the plan of care. We plan to proceed with the scheduled procedure.  Hortencia Pilar, MD  10/17/2019, 11:49 AM

## 2019-10-18 ENCOUNTER — Encounter: Payer: Self-pay | Admitting: Cardiology

## 2019-10-18 DIAGNOSIS — D509 Iron deficiency anemia, unspecified: Secondary | ICD-10-CM | POA: Diagnosis not present

## 2019-10-18 DIAGNOSIS — Z992 Dependence on renal dialysis: Secondary | ICD-10-CM | POA: Diagnosis not present

## 2019-10-18 DIAGNOSIS — N2581 Secondary hyperparathyroidism of renal origin: Secondary | ICD-10-CM | POA: Diagnosis not present

## 2019-10-18 DIAGNOSIS — N186 End stage renal disease: Secondary | ICD-10-CM | POA: Diagnosis not present

## 2019-10-18 DIAGNOSIS — D631 Anemia in chronic kidney disease: Secondary | ICD-10-CM | POA: Diagnosis not present

## 2019-10-19 DIAGNOSIS — H903 Sensorineural hearing loss, bilateral: Secondary | ICD-10-CM | POA: Diagnosis not present

## 2019-10-19 DIAGNOSIS — J301 Allergic rhinitis due to pollen: Secondary | ICD-10-CM | POA: Diagnosis not present

## 2019-10-20 DIAGNOSIS — Z992 Dependence on renal dialysis: Secondary | ICD-10-CM | POA: Diagnosis not present

## 2019-10-20 DIAGNOSIS — D509 Iron deficiency anemia, unspecified: Secondary | ICD-10-CM | POA: Diagnosis not present

## 2019-10-20 DIAGNOSIS — N2581 Secondary hyperparathyroidism of renal origin: Secondary | ICD-10-CM | POA: Diagnosis not present

## 2019-10-20 DIAGNOSIS — D631 Anemia in chronic kidney disease: Secondary | ICD-10-CM | POA: Diagnosis not present

## 2019-10-20 DIAGNOSIS — N186 End stage renal disease: Secondary | ICD-10-CM | POA: Diagnosis not present

## 2019-10-23 ENCOUNTER — Encounter: Payer: Self-pay | Admitting: Family Medicine

## 2019-10-23 DIAGNOSIS — Z992 Dependence on renal dialysis: Secondary | ICD-10-CM | POA: Diagnosis not present

## 2019-10-23 DIAGNOSIS — D509 Iron deficiency anemia, unspecified: Secondary | ICD-10-CM | POA: Diagnosis not present

## 2019-10-23 DIAGNOSIS — N186 End stage renal disease: Secondary | ICD-10-CM | POA: Diagnosis not present

## 2019-10-23 DIAGNOSIS — N2581 Secondary hyperparathyroidism of renal origin: Secondary | ICD-10-CM | POA: Diagnosis not present

## 2019-10-23 DIAGNOSIS — D631 Anemia in chronic kidney disease: Secondary | ICD-10-CM | POA: Diagnosis not present

## 2019-10-23 NOTE — Telephone Encounter (Signed)
Do not see that this medication was refilled by Jackelyn Poling before. Will let PCP review.

## 2019-10-23 NOTE — Telephone Encounter (Signed)
Please call patient's pharmacy and see when this was last filled? Per Epic, one dispense in last 6 months.

## 2019-10-24 ENCOUNTER — Telehealth: Payer: Self-pay

## 2019-10-24 MED ORDER — AMIODARONE HCL 200 MG PO TABS: 200.0000 mg | ORAL_TABLET | ORAL | 3 refills | Status: AC

## 2019-10-24 NOTE — Telephone Encounter (Signed)
Refill sent for amiodarone 200 mg one tablet daily with 90 and 3 refills to [facility] in [place].

## 2019-10-26 DIAGNOSIS — D631 Anemia in chronic kidney disease: Secondary | ICD-10-CM | POA: Diagnosis not present

## 2019-10-26 DIAGNOSIS — N186 End stage renal disease: Secondary | ICD-10-CM | POA: Diagnosis not present

## 2019-10-26 DIAGNOSIS — Z992 Dependence on renal dialysis: Secondary | ICD-10-CM | POA: Diagnosis not present

## 2019-10-26 DIAGNOSIS — D509 Iron deficiency anemia, unspecified: Secondary | ICD-10-CM | POA: Diagnosis not present

## 2019-10-26 DIAGNOSIS — N2581 Secondary hyperparathyroidism of renal origin: Secondary | ICD-10-CM | POA: Diagnosis not present

## 2019-10-27 DIAGNOSIS — D509 Iron deficiency anemia, unspecified: Secondary | ICD-10-CM | POA: Diagnosis not present

## 2019-10-27 DIAGNOSIS — D631 Anemia in chronic kidney disease: Secondary | ICD-10-CM | POA: Diagnosis not present

## 2019-10-27 DIAGNOSIS — N2581 Secondary hyperparathyroidism of renal origin: Secondary | ICD-10-CM | POA: Diagnosis not present

## 2019-10-27 DIAGNOSIS — N186 End stage renal disease: Secondary | ICD-10-CM | POA: Diagnosis not present

## 2019-10-27 DIAGNOSIS — Z992 Dependence on renal dialysis: Secondary | ICD-10-CM | POA: Diagnosis not present

## 2019-10-30 DIAGNOSIS — N186 End stage renal disease: Secondary | ICD-10-CM | POA: Diagnosis not present

## 2019-10-30 DIAGNOSIS — D631 Anemia in chronic kidney disease: Secondary | ICD-10-CM | POA: Diagnosis not present

## 2019-10-30 DIAGNOSIS — D509 Iron deficiency anemia, unspecified: Secondary | ICD-10-CM | POA: Diagnosis not present

## 2019-10-30 DIAGNOSIS — N2581 Secondary hyperparathyroidism of renal origin: Secondary | ICD-10-CM | POA: Diagnosis not present

## 2019-10-30 DIAGNOSIS — Z992 Dependence on renal dialysis: Secondary | ICD-10-CM | POA: Diagnosis not present

## 2019-11-01 ENCOUNTER — Other Ambulatory Visit (INDEPENDENT_AMBULATORY_CARE_PROVIDER_SITE_OTHER): Payer: Self-pay | Admitting: Vascular Surgery

## 2019-11-01 DIAGNOSIS — Z992 Dependence on renal dialysis: Secondary | ICD-10-CM | POA: Diagnosis not present

## 2019-11-01 DIAGNOSIS — N186 End stage renal disease: Secondary | ICD-10-CM | POA: Diagnosis not present

## 2019-11-01 DIAGNOSIS — Z95820 Peripheral vascular angioplasty status with implants and grafts: Secondary | ICD-10-CM

## 2019-11-01 DIAGNOSIS — N2581 Secondary hyperparathyroidism of renal origin: Secondary | ICD-10-CM | POA: Diagnosis not present

## 2019-11-01 DIAGNOSIS — D631 Anemia in chronic kidney disease: Secondary | ICD-10-CM | POA: Diagnosis not present

## 2019-11-01 DIAGNOSIS — D509 Iron deficiency anemia, unspecified: Secondary | ICD-10-CM | POA: Diagnosis not present

## 2019-11-03 DIAGNOSIS — D631 Anemia in chronic kidney disease: Secondary | ICD-10-CM | POA: Diagnosis not present

## 2019-11-03 DIAGNOSIS — N2581 Secondary hyperparathyroidism of renal origin: Secondary | ICD-10-CM | POA: Diagnosis not present

## 2019-11-03 DIAGNOSIS — D509 Iron deficiency anemia, unspecified: Secondary | ICD-10-CM | POA: Diagnosis not present

## 2019-11-03 DIAGNOSIS — N186 End stage renal disease: Secondary | ICD-10-CM | POA: Diagnosis not present

## 2019-11-03 DIAGNOSIS — Z992 Dependence on renal dialysis: Secondary | ICD-10-CM | POA: Diagnosis not present

## 2019-11-05 ENCOUNTER — Ambulatory Visit (INDEPENDENT_AMBULATORY_CARE_PROVIDER_SITE_OTHER): Payer: Medicare Other

## 2019-11-05 ENCOUNTER — Encounter (INDEPENDENT_AMBULATORY_CARE_PROVIDER_SITE_OTHER): Payer: Self-pay | Admitting: Vascular Surgery

## 2019-11-05 ENCOUNTER — Ambulatory Visit (INDEPENDENT_AMBULATORY_CARE_PROVIDER_SITE_OTHER): Payer: Medicare Other | Admitting: Vascular Surgery

## 2019-11-05 ENCOUNTER — Other Ambulatory Visit: Payer: Self-pay

## 2019-11-05 VITALS — BP 170/71 | HR 64 | Ht 63.0 in | Wt 165.0 lb

## 2019-11-05 DIAGNOSIS — Z95820 Peripheral vascular angioplasty status with implants and grafts: Secondary | ICD-10-CM

## 2019-11-05 DIAGNOSIS — I10 Essential (primary) hypertension: Secondary | ICD-10-CM

## 2019-11-05 DIAGNOSIS — N186 End stage renal disease: Secondary | ICD-10-CM

## 2019-11-05 DIAGNOSIS — I480 Paroxysmal atrial fibrillation: Secondary | ICD-10-CM | POA: Diagnosis not present

## 2019-11-05 DIAGNOSIS — T829XXS Unspecified complication of cardiac and vascular prosthetic device, implant and graft, sequela: Secondary | ICD-10-CM | POA: Diagnosis not present

## 2019-11-05 DIAGNOSIS — I7025 Atherosclerosis of native arteries of other extremities with ulceration: Secondary | ICD-10-CM | POA: Diagnosis not present

## 2019-11-05 MED ORDER — TRAMADOL HCL 50 MG PO TABS: 50.0000 mg | ORAL_TABLET | Freq: Two times a day (BID) | ORAL | 0 refills | Status: DC | PRN

## 2019-11-05 NOTE — Progress Notes (Signed)
MRN : [id number]  Stacy Pham is a 86 y.o. (1931/01/16) female who presents with chief complaint of  Chief Complaint  Patient presents with  . Follow-up    U/S Fol[place]ow up   .  History of Present Illness:   She is s/p Percutaneous transluminal angioplasty peroneal artery with a 2.5 mm Ultraverse balloon with percutaneous transluminal angioplasty and stent placement of the SFA and popliteal arteries with a 6 mm x 100 mm life stent  on October 17, 2019     She returns today with increased pain in her left great toe.  The kissing ulcer on the right between the fourth and fifth toes appears significantly improved.  She also notes that she had her right arm AV graft treated at [facility] and they told her there is still a problem with it.  She has not missed any dialysis lately.  ABIs obtained today are essentially the same as her preintervention right leg study.      No outpatient medications have been marked as taking for the 11/05/19 encounter (Office Visit) with Delana Meyer, Dolores Lory, MD.    Past Medical History:  Diagnosis Date  . (HFpEF) heart failure with preserved ejection fraction ([place])    a. 10/2017 Echo: EF 60-65%, Gr1 DD, mild MR, mildly dil LA/RA. PASP 76mmHg.  . Arthritis   . Back pain   . Bronchitis   . Cataract   . Diabetes mellitus without complication (HCC)    no meds currently  . Dialysis patient ([place])   . ESRD (end stage renal disease) ([place])    a. 2018 - initially on HD but then transitioned to nightly PD in 08/2017.  [name] Gout   . Hypertension   . PAF (paroxysmal atrial fibrillation) (HCC)    a. CHA2DS2VASc = 5-->eliquis 2.5 BID.    Past Surgical History:  Procedure Laterality Date  . AV FISTULA PLACEMENT Right 09/13/2018   Procedure: INSERTION OF ARTERIOVENOUS (AV) GORE-TEX GRAFT ARM;  Surgeon: Katha Cabal, MD;  Location: [facility] ORS;  Service: Vascular;  Laterality: Right;  . CAPD INSERTION N/A 06/08/2017   Procedure: LAPAROSCOPIC INSERTION CONTINUOUS AMBULATORY  PERITONEAL DIALYSIS  (CAPD) CATHETER;  Surgeon: Katha Cabal, MD;  Location: [facility] ORS;  Service: Vascular;  Laterality: N/A;  . CATARACT EXTRACTION, BILATERAL Bilateral   . DIALYSIS/PERMA CATHETER INSERTION N/A 03/18/2017   Procedure: DIALYSIS/PERMA CATHETER INSERTION;  Surgeon: Katha Cabal, MD;  Location: [facility];  Service: Cardiovascular;  Laterality: N/A;  . DIALYSIS/PERMA CATHETER REMOVAL N/A 10/20/2017   Procedure: DIALYSIS/PERMA CATHETER REMOVAL;  Surgeon: Algernon Huxley, MD;  Location: [facility];  Service: Cardiovascular;  Laterality: N/A;  . EYE SURGERY    . LOWER EXTREMITY ANGIOGRAPHY Left 09/11/2019   Procedure: LOWER EXTREMITY ANGIOGRAPHY;  Surgeon: Katha Cabal, MD;  Location: [facility];  Service: Cardiovascular;  Laterality: Left;  . LOWER EXTREMITY ANGIOGRAPHY Right 10/17/2019   Procedure: LOWER EXTREMITY ANGIOGRAPHY;  Surgeon: Katha Cabal, MD;  Location: [facility] [facility];  Service: Cardiovascular;  Laterality: Right;  . REMOVAL OF A DIALYSIS CATHETER N/A 02/23/2019   Procedure: REMOVAL OF A DIALYSIS CATHETER ( PD CATH);  Surgeon: Katha Cabal, MD;  Location: [facility];  Service: Vascular;  Laterality: N/A;    Social History Social History   Tobacco Use  . Smoking status: Never Smoker  . Smokeless tobacco: Never Used  Substance Use Topics  . Alcohol use: No  . Drug use: No  Family History Family History  Problem Relation Age of Onset  . Hypertension Father     No Known Allergies   REVIEW OF SYSTEMS (Negative unless checked)  Constitutional: [] Weight loss  [] Fever  [] Chills Cardiac: [] Chest pain   [] Chest pressure   [] Palpitations   [] Shortness of breath when laying flat   [] Shortness of breath with exertion. Vascular:  [x] Pain in legs with walking   [x] Pain in legs at rest  [] History of DVT   [] Phlebitis   [] Swelling in legs   [] Varicose veins   [x] Non-healing ulcers Pulmonary:   [] Uses home oxygen    [] Productive cough   [] Hemoptysis   [] Wheeze  [] COPD   [] Asthma Neurologic:  [] Dizziness   [] Seizures   [] History of stroke   [] History of TIA  [] Aphasia   [] Vissual changes   [] Weakness or numbness in arm   [] Weakness or numbness in leg Musculoskeletal:   [] Joint swelling   [] Joint pain   [] Low back pain Hematologic:  [] Easy bruising  [] Easy bleeding   [] Hypercoagulable state   [] Anemic Gastrointestinal:  [] Diarrhea   [] Vomiting  [] Gastroesophageal reflux/heartburn   [] Difficulty swallowing. Genitourinary:  [x] Chronic kidney disease   [] Difficult urination  [] Frequent urination   [] Blood in urine Skin:  [] Rashes   [] Ulcers  Psychological:  [] History of anxiety   []  History of major depression.  Physical Examination  Vitals:   11/05/19 1551  BP: (!) 170/71  Pulse: 64  Weight: 165 lb (74.8 kg)  Height: 5\' 3"  (1.6 m)   Body mass index is 29.23 kg/m. Gen: WD/WN, NAD Head: [place]/AT, No temporalis wasting.  Ear/Nose/Throat: Hearing grossly intact, nares w/o erythema or drainage Eyes: PER, EOMI, sclera nonicteric.  Neck: Supple, no large masses.   Pulmonary:  Good air movement, no audible wheezing bilaterally, no use of accessory muscles.  Cardiac: RRR, no JVD Vascular:  Right arm AV graft good thrill good bruit;  Left great toe dry gangrene with a wet rim Vessel Right Left  Radial Palpable Palpable  PT Not Palpable Not Palpable  DP Not Palpable Not Palpable  Gastrointestinal: Non-distended. No guarding/no peritoneal signs.  Musculoskeletal: M/S 5/5 throughout.  No deformity or atrophy.  Neurologic: CN 2-12 intact. Symmetrical.  Speech is fluent. Motor exam as listed above. Psychiatric: Judgment intact, Mood & affect appropriate for pt's clinical situation. Dermatologic: No rashes + ulcers noted.  + changes consistent with cellulitis.   CBC Lab Results  Component Value Date   WBC 9.4 09/14/2019   HGB 11.1 (L) 09/14/2019   HCT 34.6 (L) 09/14/2019   MCV 96.9 09/14/2019   PLT 311.0  09/14/2019    BMET    Component Value Date/Time   NA 139 09/14/2019 1153   K 4.9 09/14/2019 1153   CL 98 09/14/2019 1153   CO2 29 09/14/2019 1153   GLUCOSE 114 (H) 09/14/2019 1153   BUN 30 (H) 09/14/2019 1153   CREATININE 6.97 (HH) 09/14/2019 1153   CALCIUM 9.8 09/14/2019 1153   GFRNONAA 2 (L) 06/28/2019 0941   GFRAA 2 (L) 06/28/2019 0941   CrCl cannot be calculated (Patient's most recent lab result is older than the maximum 21 days allowed.).  COAG Lab Results  Component Value Date   INR 1.5 (H) 02/16/2019   INR 1.45 08/21/2018   INR 1.43 06/16/2018    Radiology PERIPHERAL VASCULAR CATHETERIZATION  Result Date: 10/17/2019 See op note  VAS [place] ABI WITH/WO TBI  Result Date: 10/11/2019 LOWER EXTREMITY DOPPLER STUDY Indications: Peripheral artery disease.  Vascular  Interventions: 09/11/2019: Crosser Atherectomy of the Left                         Tibioperoneal Trunk and Peroneal Artery. PTA of the Left                         Tibioperoneal trunk and Peroneal Artery. PTA and Stent                         placement of the Left SFA and Popliteal Artery. Performing Technologist: Almira Coaster RVS  Examination Guidelines: A complete evaluation includes at minimum, Doppler waveform signals and systolic blood pressure reading at the level of bilateral brachial, anterior tibial, and posterior tibial arteries, when vessel segments are accessible. Bilateral testing is considered an integral part of a complete examination. Photoelectric Plethysmograph (PPG) waveforms and toe systolic pressure readings are included as required and additional duplex testing as needed. Limited examinations for reoccurring indications may be performed as noted.  ABI Findings: +---------+------------------+-----+----------+--------+ Right    Rt Pressure (mmHg)IndexWaveform  Comment  +---------+------------------+-----+----------+--------+ Brachial                                  HDA       +---------+------------------+-----+----------+--------+ ATA      29                0.18 monophasic         +---------+------------------+-----+----------+--------+ PTA      128               0.78 monophasic         +---------+------------------+-----+----------+--------+ [place]               0.84 Normal             +---------+------------------+-----+----------+--------+ +---------+------------------+-----+----------+-------------+ Left     Lt Pressure (mmHg)IndexWaveform  Comment       +---------+------------------+-----+----------+-------------+ Brachial 164                                            +---------+------------------+-----+----------+-------------+ ATA      149               0.91 monophasic              +---------+------------------+-----+----------+-------------+ PTA      116               0.71 monophasic              +---------+------------------+-----+----------+-------------+ Great Toe                                 Necrotic Toes +---------+------------------+-----+----------+-------------+ +-------+-----------+-----------+------------+------------+ ABI/TBIToday's ABIToday's TBIPrevious ABIPrevious TBI +-------+-----------+-----------+------------+------------+ Right  .78        .84                                 +-------+-----------+-----------+------------+------------+ Left   .71                                            +-------+-----------+-----------+------------+------------+  Summary: Right: Resting right ankle-brachial index indicates moderate right lower extremity arterial disease. The right toe-brachial index is normal. Left: Resting left ankle-brachial index indicates moderate left lower extremity arterial disease.  *See table(s) above for measurements and observations.  Electronically signed by Hortencia Pilar MD on 10/11/2019 at 5:06:10 PM.    Final    VAS [place] LOWER EXTREMITY ARTERIAL DUPLEX  Result Date:  10/11/2019 LOWER EXTREMITY ARTERIAL DUPLEX STUDY Indications: Peripheral artery disease, and Left great toe ulcer.  Vascular Interventions: 09/11/2019: Crosser Atherectomy of the Left                         Tibioperoneal Trunk and Peroneal Artery. PTA of the Left                         Tibioperoneal trunk and Peroneal Artery. PTA and Stent                         placement of the Left SFA and Popliteal Artery. Current ABI:            Rt .78, Lt .91 Comparison Study: 09/05/2019 Performing Technologist: Almira Coaster RVS  Examination Guidelines: A complete evaluation includes B-mode imaging, spectral Doppler, color Doppler, and power Doppler as needed of all accessible portions of each vessel. Bilateral testing is considered an integral part of a complete examination. Limited examinations for reoccurring indications may be performed as noted.  +-----------+--------+-----+--------+----------+--------+ LEFT       PSV cm/sRatioStenosisWaveform  Comments +-----------+--------+-----+--------+----------+--------+ CFA Distal 197                  biphasic           +-----------+--------+-----+--------+----------+--------+ DFA        132                  biphasic           +-----------+--------+-----+--------+----------+--------+ SFA Prox   109                  biphasic           +-----------+--------+-----+--------+----------+--------+ SFA Mid    237                  monophasic         +-----------+--------+-----+--------+----------+--------+ SFA Distal 135                  monophasic         +-----------+--------+-----+--------+----------+--------+ POP Distal 112                  monophasic         +-----------+--------+-----+--------+----------+--------+ ATA Distal 39                   monophasic         +-----------+--------+-----+--------+----------+--------+ PTA Distal 103                  monophasic          +-----------+--------+-----+--------+----------+--------+ PERO Distal79                   monophasic         +-----------+--------+-----+--------+----------+--------+  Summary: Left: Imaging and Waveforms obtained throughout in the Left Lower Extremity. Monophasic flow seen prdominantly.  See table(s) above for measurements and observations. Electronically signed by Hortencia Pilar MD on 10/11/2019 at 5:06:13 PM.  Final      Assessment/Plan 1. Atherosclerosis of native arteries of the extremities with ulceration ([place]) Recommend:  Patient should undergo arterial duplex of the lower extremity ASAP because there has been a significant deterioration in the patient's lower extremity symptoms.  The patient states they are having increased pain and a marked decrease in the distance that they can walk.  The risks and benefits as well as the alternatives were discussed in detail with the patient.  All questions were answered.  Patient agrees to proceed and understands this could be a prelude to angiography and intervention.  The patient will follow up with me in the office to review the studies.  - VAS [place] LOWER EXTREMITY ARTERIAL DUPLEX; Future  2. Complication from renal dialysis device, sequela Recommend:  The patient is experiencing increasing problems with their dialysis access.  Patient should have a HDA to assess her access given the [facility] finding  - VAS [place] (AVF, AVG); Future  3. ESRD (end stage renal disease) ([place]) At the present time the patient does not have adequate dialysis access.  Arrangements will be made to correct this as noted above.  Continue hemodialysis as ordered without interruption.  Avoid nephrotoxic medications and dehydration.  Further plans per nephrology  4. Paroxysmal atrial fibrillation (HCC) Continue antiarrhythmia medications as already ordered, these medications have been reviewed and there are no changes at this  time.  Continue anticoagulation as ordered by Cardiology Service   5. Essential hypertension Continue antihypertensive medications as already ordered, these medications have been reviewed and there are no changes at this time.    Hortencia Pilar, MD  11/05/2019 4:03 PM

## 2019-11-06 DIAGNOSIS — D509 Iron deficiency anemia, unspecified: Secondary | ICD-10-CM | POA: Diagnosis not present

## 2019-11-06 DIAGNOSIS — D631 Anemia in chronic kidney disease: Secondary | ICD-10-CM | POA: Diagnosis not present

## 2019-11-06 DIAGNOSIS — N2581 Secondary hyperparathyroidism of renal origin: Secondary | ICD-10-CM | POA: Diagnosis not present

## 2019-11-06 DIAGNOSIS — Z992 Dependence on renal dialysis: Secondary | ICD-10-CM | POA: Diagnosis not present

## 2019-11-06 DIAGNOSIS — N186 End stage renal disease: Secondary | ICD-10-CM | POA: Diagnosis not present

## 2019-11-06 DIAGNOSIS — E119 Type 2 diabetes mellitus without complications: Secondary | ICD-10-CM | POA: Diagnosis not present

## 2019-11-07 ENCOUNTER — Other Ambulatory Visit: Payer: Self-pay

## 2019-11-07 ENCOUNTER — Other Ambulatory Visit (INDEPENDENT_AMBULATORY_CARE_PROVIDER_SITE_OTHER): Payer: Self-pay | Admitting: Vascular Surgery

## 2019-11-07 ENCOUNTER — Ambulatory Visit (INDEPENDENT_AMBULATORY_CARE_PROVIDER_SITE_OTHER): Payer: Medicare Other

## 2019-11-07 DIAGNOSIS — Z95820 Peripheral vascular angioplasty status with implants and grafts: Secondary | ICD-10-CM

## 2019-11-07 DIAGNOSIS — T829XXS Unspecified complication of cardiac and vascular prosthetic device, implant and graft, sequela: Secondary | ICD-10-CM

## 2019-11-07 DIAGNOSIS — I7025 Atherosclerosis of native arteries of other extremities with ulceration: Secondary | ICD-10-CM | POA: Diagnosis not present

## 2019-11-07 DIAGNOSIS — N186 End stage renal disease: Secondary | ICD-10-CM | POA: Diagnosis not present

## 2019-11-08 DIAGNOSIS — N186 End stage renal disease: Secondary | ICD-10-CM | POA: Diagnosis not present

## 2019-11-08 DIAGNOSIS — N2581 Secondary hyperparathyroidism of renal origin: Secondary | ICD-10-CM | POA: Diagnosis not present

## 2019-11-08 DIAGNOSIS — D631 Anemia in chronic kidney disease: Secondary | ICD-10-CM | POA: Diagnosis not present

## 2019-11-08 DIAGNOSIS — D509 Iron deficiency anemia, unspecified: Secondary | ICD-10-CM | POA: Diagnosis not present

## 2019-11-08 DIAGNOSIS — Z992 Dependence on renal dialysis: Secondary | ICD-10-CM | POA: Diagnosis not present

## 2019-11-09 DIAGNOSIS — N186 End stage renal disease: Secondary | ICD-10-CM | POA: Diagnosis not present

## 2019-11-09 DIAGNOSIS — Z992 Dependence on renal dialysis: Secondary | ICD-10-CM | POA: Diagnosis not present

## 2019-11-10 DIAGNOSIS — N2581 Secondary hyperparathyroidism of renal origin: Secondary | ICD-10-CM | POA: Diagnosis not present

## 2019-11-10 DIAGNOSIS — D509 Iron deficiency anemia, unspecified: Secondary | ICD-10-CM | POA: Diagnosis not present

## 2019-11-10 DIAGNOSIS — Z992 Dependence on renal dialysis: Secondary | ICD-10-CM | POA: Diagnosis not present

## 2019-11-10 DIAGNOSIS — D631 Anemia in chronic kidney disease: Secondary | ICD-10-CM | POA: Diagnosis not present

## 2019-11-10 DIAGNOSIS — N186 End stage renal disease: Secondary | ICD-10-CM | POA: Diagnosis not present

## 2019-11-12 ENCOUNTER — Other Ambulatory Visit (INDEPENDENT_AMBULATORY_CARE_PROVIDER_SITE_OTHER): Payer: Self-pay | Admitting: Nurse Practitioner

## 2019-11-12 ENCOUNTER — Telehealth (INDEPENDENT_AMBULATORY_CARE_PROVIDER_SITE_OTHER): Payer: Self-pay

## 2019-11-12 ENCOUNTER — Encounter (INDEPENDENT_AMBULATORY_CARE_PROVIDER_SITE_OTHER): Payer: Self-pay

## 2019-11-12 ENCOUNTER — Ambulatory Visit (INDEPENDENT_AMBULATORY_CARE_PROVIDER_SITE_OTHER): Payer: Medicare Other | Admitting: Vascular Surgery

## 2019-11-12 ENCOUNTER — Encounter (INDEPENDENT_AMBULATORY_CARE_PROVIDER_SITE_OTHER): Payer: Self-pay | Admitting: Vascular Surgery

## 2019-11-12 ENCOUNTER — Other Ambulatory Visit
Admission: RE | Admit: 2019-11-12 | Discharge: 2019-11-12 | Disposition: A | Discharge status: Home / Self Care | Payer: Medicare Other | Source: Ambulatory Visit | Attending: Vascular Surgery | Admitting: Vascular Surgery

## 2019-11-12 ENCOUNTER — Other Ambulatory Visit: Payer: Self-pay

## 2019-11-12 VITALS — BP 169/67 | HR 64 | Ht 65.0 in | Wt 150.0 lb

## 2019-11-12 DIAGNOSIS — I7025 Atherosclerosis of native arteries of other extremities with ulceration: Secondary | ICD-10-CM

## 2019-11-12 DIAGNOSIS — Z20822 Contact with and (suspected) exposure to covid-19: Secondary | ICD-10-CM | POA: Insufficient documentation

## 2019-11-12 DIAGNOSIS — T829XXS Unspecified complication of cardiac and vascular prosthetic device, implant and graft, sequela: Secondary | ICD-10-CM

## 2019-11-12 DIAGNOSIS — Z01812 Encounter for preprocedural laboratory examination: Secondary | ICD-10-CM | POA: Insufficient documentation

## 2019-11-12 DIAGNOSIS — E1122 Type 2 diabetes mellitus with diabetic chronic kidney disease: Secondary | ICD-10-CM

## 2019-11-12 DIAGNOSIS — N186 End stage renal disease: Secondary | ICD-10-CM | POA: Diagnosis not present

## 2019-11-12 DIAGNOSIS — Z992 Dependence on renal dialysis: Secondary | ICD-10-CM

## 2019-11-12 DIAGNOSIS — E782 Mixed hyperlipidemia: Secondary | ICD-10-CM

## 2019-11-12 LAB — SARS CORONAVIRUS 2 (TAT 6-24 HRS): SARS Coronavirus 2: NEGATIVE

## 2019-11-12 NOTE — Progress Notes (Signed)
MRN : [id number]  Stacy Pham is a 86 y.o. ([birth year]) female who presents with chief complaint of  Chief Complaint  Patient presents with  . Follow-up    LE Arterial U/S results  .  History of Present Illness:  She returns today with increased pain in her left great toe.  The kissing ulcer on the right between the fourth and fifth toes appears significantly improved.  She also notes that she had her right arm AV graft treated at [facility] and they told her there is still a problem with it.  She has not missed any dialysis lately.  She is s/p Percutaneous transluminal angioplastyperonealartery with a 2.5 mm Ultraverseballoon with percutaneous transluminal angioplastyand stent placementof the SFA and popliteal arteries with a6 mm x 100 mm life stenton October 17, 2019  ABIs obtained at her last visit were essentially the same as her preintervention right leg study.  HDA obtained today demonstrates a flow volume of only 748 with diffuse hemodynamically significant disease in the mid brachial axillary graft.  Duplex ultrasound of the arterial system bilateral lower extremities demonstrates the right remains patent intervention site is free of hemodynamically significant stenoses.  On the left there is greater than 70% distal SFA stenosis.   No outpatient medications have been marked as taking for the 11/12/19 encounter (Office Visit) with Delana Meyer, Dolores Lory, MD.    Past Medical History:  Diagnosis Date  . (HFpEF) heart failure with preserved ejection fraction ([place])    a. 10/2017 Echo: EF 60-65%, Gr1 DD, mild MR, mildly dil LA/RA. PASP 68mmHg.  . Arthritis   . Back pain   . Bronchitis   . Cataract   . Diabetes mellitus without complication (HCC)    no meds currently  . Dialysis patient ([place])   . ESRD (end stage renal disease) ([place])    a. 2018 - initially on HD but then transitioned to nightly PD in 08/2017.  [name] Gout   . Hypertension   . PAF (paroxysmal atrial fibrillation)  (HCC)    a. CHA2DS2VASc = 5-->eliquis 2.5 BID.    Past Surgical History:  Procedure Laterality Date  . AV FISTULA PLACEMENT Right 09/13/2018   Procedure: INSERTION OF ARTERIOVENOUS (AV) GORE-TEX GRAFT ARM;  Surgeon: Katha Cabal, MD;  Location: [facility] ORS;  Service: Vascular;  Laterality: Right;  . CAPD INSERTION N/A 06/08/2017   Procedure: LAPAROSCOPIC INSERTION CONTINUOUS AMBULATORY PERITONEAL DIALYSIS  (CAPD) CATHETER;  Surgeon: Katha Cabal, MD;  Location: [facility] ORS;  Service: Vascular;  Laterality: N/A;  . CATARACT EXTRACTION, BILATERAL Bilateral   . DIALYSIS/PERMA CATHETER INSERTION N/A 03/18/2017   Procedure: DIALYSIS/PERMA CATHETER INSERTION;  Surgeon: Katha Cabal, MD;  Location: [place] [facility];  Service: Cardiovascular;  Laterality: N/A;  . DIALYSIS/PERMA CATHETER REMOVAL N/A 10/20/2017   Procedure: DIALYSIS/PERMA CATHETER REMOVAL;  Surgeon: Algernon Huxley, MD;  Location: [place] [facility];  Service: Cardiovascular;  Laterality: N/A;  . EYE SURGERY    . LOWER EXTREMITY ANGIOGRAPHY Left 09/11/2019   Procedure: LOWER EXTREMITY ANGIOGRAPHY;  Surgeon: Katha Cabal, MD;  Location: [place] [facility];  Service: Cardiovascular;  Laterality: Left;  . LOWER EXTREMITY ANGIOGRAPHY Right 10/17/2019   Procedure: LOWER EXTREMITY ANGIOGRAPHY;  Surgeon: Katha Cabal, MD;  Location: [place] [facility];  Service: Cardiovascular;  Laterality: Right;  . REMOVAL OF A DIALYSIS CATHETER N/A 02/23/2019   Procedure: REMOVAL OF A DIALYSIS CATHETER ( PD CATH);  Surgeon: Katha Cabal, MD;  Location: [facility]  ORS;  Service: Vascular;  Laterality: N/A;    Social History Social History   Tobacco Use  . Smoking status: Never Smoker  . Smokeless tobacco: Never Used  Substance Use Topics  . Alcohol use: No  . Drug use: No    Family History Family History  Problem Relation Age of Onset  . Hypertension Father     No Known Allergies   REVIEW OF SYSTEMS (Negative unless  checked)  Constitutional: [] Weight loss  [] Fever  [] Chills Cardiac: [] Chest pain   [] Chest pressure   [] Palpitations   [] Shortness of breath when laying flat   [] Shortness of breath with exertion. Vascular:  [x] Pain in legs with walking   [x] Pain in legs at rest  [] History of DVT   [] Phlebitis   [] Swelling in legs   [] Varicose veins   [x] Non-healing ulcers Pulmonary:   [] Uses home oxygen   [] Productive cough   [] Hemoptysis   [] Wheeze  [] COPD   [] Asthma Neurologic:  [] Dizziness   [] Seizures   [] History of stroke   [] History of TIA  [] Aphasia   [] Vissual changes   [] Weakness or numbness in arm   [] Weakness or numbness in leg Musculoskeletal:   [] Joint swelling   [] Joint pain   [] Low back pain Hematologic:  [] Easy bruising  [] Easy bleeding   [] Hypercoagulable state   [] Anemic Gastrointestinal:  [] Diarrhea   [] Vomiting  [] Gastroesophageal reflux/heartburn   [] Difficulty swallowing. Genitourinary:  [x] Chronic kidney disease   [] Difficult urination  [] Frequent urination   [] Blood in urine Skin:  [] Rashes   [] Ulcers  Psychological:  [] History of anxiety   []  History of major depression.  Physical Examination  Vitals:   11/12/19 0919  BP: (!) 169/67  Pulse: 64  Weight: 150 lb (68 kg)  Height: 5\' 5"  (1.651 m)   Body mass index is 24.96 kg/m. Gen: WD/WN, NAD Head: [place]/AT, No temporalis wasting.  Ear/Nose/Throat: Hearing grossly intact, nares w/o erythema or drainage Eyes: PER, EOMI, sclera nonicteric.  Neck: Supple, no large masses.   Pulmonary:  Good air movement, no audible wheezing bilaterally, no use of accessory muscles.  Cardiac: RRR, no JVD Vascular: Right brachial axillary graft is markedly pulsatile with a poor thrill skin integrity still is good.  The kissing ulcer between the fourth and fifth toes on the right is improved.  There is dry gangrene of the left great toe with some drainage at the edge of the wound. Vessel Right Left  Radial Palpable Palpable  PT  not palpable  not  palpable  DP  not palpable  not palpable  Gastrointestinal: Non-distended. No guarding/no peritoneal signs.  Musculoskeletal: M/S 5/5 throughout.  No deformity or atrophy.  Neurologic: CN 2-12 intact. Symmetrical.  Speech is fluent. Motor exam as listed above. Psychiatric: Judgment intact, Mood & affect appropriate for pt's clinical situation. Dermatologic: No rashes or ulcers noted.  No changes consistent with cellulitis.  CBC Lab Results  Component Value Date   WBC 9.4 09/14/2019   HGB 11.1 (L) 09/14/2019   HCT 34.6 (L) 09/14/2019   MCV 96.9 09/14/2019   PLT 311.0 09/14/2019    BMET    Component Value Date/Time   NA 139 09/14/2019 1153   K 4.9 09/14/2019 1153   CL 98 09/14/2019 1153   CO2 29 09/14/2019 1153   GLUCOSE 114 (H) 09/14/2019 1153   BUN 30 (H) 09/14/2019 1153   CREATININE 6.97 (HH) 09/14/2019 1153   CALCIUM 9.8 09/14/2019 1153   GFRNONAA 2 (L) 06/28/2019 0941   GFRAA  2 (L) 06/28/2019 0941   CrCl cannot be calculated (Patient's most recent lab result is older than the maximum 21 days allowed.).  COAG Lab Results  Component Value Date   INR 1.5 (H) 02/16/2019   INR 1.45 08/21/2018   INR 1.43 06/16/2018    Radiology PERIPHERAL VASCULAR CATHETERIZATION  Result Date: 10/17/2019 See op note  ABI WITH/WO TBI  Result Date: 11/05/2019 LOWER EXTREMITY DOPPLER STUDY Indications: Peripheral artery disease.  Vascular Interventions: 09/11/2019: Crosser Atherectomy of the Left                         Tibioperoneal Trunk and Peroneal Artery. PTA of the Left                         Tibioperoneal trunk and Peroneal Artery. PTA and Stent                         placement of the Left SFA and Popliteal Artery.                         10/17/2019 PTA of Peroneal artery. PTA and stent bilateral                         SFA and popliteal arteries. Comparison Study: 10/08/2019 Performing Technologist: Charlane Ferretti RT (R)(VS)  Examination Guidelines: A complete evaluation includes at  minimum, Doppler waveform signals and systolic blood pressure reading at the level of bilateral brachial, anterior tibial, and posterior tibial arteries, when vessel segments are accessible. Bilateral testing is considered an integral part of a complete examination. Photoelectric Plethysmograph (PPG) waveforms and toe systolic pressure readings are included as required and additional duplex testing as needed. Limited examinations for reoccurring indications may be performed as noted.  ABI Findings: +---------+------------------+-----+----------+--------------+ Right    Rt Pressure (mmHg)IndexWaveform  Comment        +---------+------------------+-----+----------+--------------+ Brachial                                  Dialysis graft +---------+------------------+-----+----------+--------------+ ATA      118               0.69 monophasic               +---------+------------------+-----+----------+--------------+ PTA      128               0.75 monophasic               +---------+------------------+-----+----------+--------------+ Great Toe42                0.25 Abnormal                 +---------+------------------+-----+----------+--------------+ +---------+------------------+-----+----------+----------+ Left     Lt Pressure (mmHg)IndexWaveform  Comment    +---------+------------------+-----+----------+----------+ Brachial 170                                         +---------+------------------+-----+----------+----------+ ATA      110               0.65 monophasic           +---------+------------------+-----+----------+----------+ PTA      129  0.76 monophasic           +---------+------------------+-----+----------+----------+ Great Toe                                 Gangrenous +---------+------------------+-----+----------+----------+ +-------+-----------+-----------+------------+------------+ ABI/TBIToday's ABIToday's TBIPrevious  ABIPrevious TBI +-------+-----------+-----------+------------+------------+ Right  .75        .25        .78         .84          +-------+-----------+-----------+------------+------------+ Left   .76                   .71                      +-------+-----------+-----------+------------+------------+ Bilateral ABIs appear essentially unchanged compared to prior study on 10/08/2019. Right TBIs appear decreased compared to prior study on 10/08/2019.  Summary: Right: Resting right ankle-brachial index indicates moderate right lower extremity arterial disease. The right toe-brachial index is abnormal. Left: Resting left ankle-brachial index indicates moderate left lower extremity arterial disease. *See table(s) above for measurements and observations.  Electronically signed by Hortencia Pilar MD on 11/05/2019 at 5:28:53 PM.   Final      Assessment/Plan 1. Complication from renal dialysis device, sequela Recommend:  The patient is experiencing increasing problems with their dialysis access.  Patient should have a right arm fistulagram with the intention for intervention.  The intention for intervention is to restore appropriate flow and prevent thrombosis and possible loss of the access.  As well as improve the quality of dialysis therapy.  The risks, benefits and alternative therapies were reviewed in detail with the patient.  All questions were answered.  The patient agrees to proceed with right arm AV graft angio/intervention.    2. Atherosclerosis of native arteries of the extremities with ulceration (HCC)  Recommend:  The patient has evidence of severe atherosclerotic changes of both lower extremities associated with ulceration and tissue loss of the left foot.  This represents a limb threatening ischemia and places the patient at the risk for left limb loss.  Patient should undergo angiography of the left lower extremities with the hope for intervention for limb salvage.  The risks  and benefits as well as the alternative therapies was discussed in detail with the patient.  All questions were answered.  Patient agrees to proceed with left leg angiography.  The patient will follow up with me in the office after the procedure.    3. ESRD (end stage renal disease) ([place]) At the present time the patient does not have adequate dialysis access since her right arm access is failing.  Arrangements will be made to correct this as noted above.  Continue hemodialysis as ordered without interruption.  Avoid nephrotoxic medications and dehydration.  Further plans per nephrology  4. Mixed hyperlipidemia Continue statin as ordered and reviewed, no changes at this time   5. Type 2 diabetes mellitus with chronic kidney disease on chronic dialysis, without long-term current use of insulin (HCC) Continue hypoglycemic medications as already ordered, these medications have been reviewed and there are no changes at this time.  Hgb A1C to be monitored as already arranged by primary service    Hortencia Pilar, MD  11/12/2019 9:43 AM

## 2019-11-12 NOTE — Telephone Encounter (Signed)
Patient was seen in office today and is now scheduled with Dr. Delana Meyer for a right arm fistulagram on 11/13/19 with a 1:15 pm arrival time to the [facility]. And covid testing 11/12/19 between 8-1 pm at the [facility]. Patient is also scheduled for a left leg angio on 11/20/19 with a 12:15 pm arrival time to the [facility] with Dr. Delana Meyer and possible covid testing on 11/16/19 between 8-1 pm at the [facility]. Pre-procedure instructions were discussed with and information given to the patient and daughter.

## 2019-11-13 ENCOUNTER — Encounter (INDEPENDENT_AMBULATORY_CARE_PROVIDER_SITE_OTHER): Payer: Self-pay | Admitting: Vascular Surgery

## 2019-11-13 ENCOUNTER — Ambulatory Visit
Admission: RE | Admit: 2019-11-13 | Discharge: 2019-11-13 | Disposition: A | Discharge status: Home / Self Care | Payer: Medicare Other | Attending: Vascular Surgery | Admitting: Vascular Surgery

## 2019-11-13 ENCOUNTER — Encounter: Payer: Self-pay | Admitting: Vascular Surgery

## 2019-11-13 ENCOUNTER — Encounter
Admission: RE | Disposition: A | Discharge status: Home / Self Care | Payer: Self-pay | Source: Home / Self Care | Attending: Vascular Surgery

## 2019-11-13 ENCOUNTER — Other Ambulatory Visit: Payer: Self-pay

## 2019-11-13 DIAGNOSIS — E1122 Type 2 diabetes mellitus with diabetic chronic kidney disease: Secondary | ICD-10-CM | POA: Diagnosis not present

## 2019-11-13 DIAGNOSIS — D509 Iron deficiency anemia, unspecified: Secondary | ICD-10-CM | POA: Diagnosis not present

## 2019-11-13 DIAGNOSIS — T82858S Stenosis of vascular prosthetic devices, implants and grafts, sequela: Secondary | ICD-10-CM | POA: Insufficient documentation

## 2019-11-13 DIAGNOSIS — I70245 Atherosclerosis of native arteries of left leg with ulceration of other part of foot: Secondary | ICD-10-CM | POA: Insufficient documentation

## 2019-11-13 DIAGNOSIS — M109 Gout, unspecified: Secondary | ICD-10-CM | POA: Insufficient documentation

## 2019-11-13 DIAGNOSIS — Z992 Dependence on renal dialysis: Secondary | ICD-10-CM

## 2019-11-13 DIAGNOSIS — E782 Mixed hyperlipidemia: Secondary | ICD-10-CM | POA: Diagnosis not present

## 2019-11-13 DIAGNOSIS — N2581 Secondary hyperparathyroidism of renal origin: Secondary | ICD-10-CM | POA: Diagnosis not present

## 2019-11-13 DIAGNOSIS — L97529 Non-pressure chronic ulcer of other part of left foot with unspecified severity: Secondary | ICD-10-CM | POA: Insufficient documentation

## 2019-11-13 DIAGNOSIS — I132 Hypertensive heart and chronic kidney disease with heart failure and with stage 5 chronic kidney disease, or end stage renal disease: Secondary | ICD-10-CM | POA: Diagnosis not present

## 2019-11-13 DIAGNOSIS — N186 End stage renal disease: Secondary | ICD-10-CM | POA: Insufficient documentation

## 2019-11-13 DIAGNOSIS — I480 Paroxysmal atrial fibrillation: Secondary | ICD-10-CM | POA: Diagnosis not present

## 2019-11-13 DIAGNOSIS — E1151 Type 2 diabetes mellitus with diabetic peripheral angiopathy without gangrene: Secondary | ICD-10-CM | POA: Diagnosis not present

## 2019-11-13 DIAGNOSIS — Y841 Kidney dialysis as the cause of abnormal reaction of the patient, or of later complication, without mention of misadventure at the time of the procedure: Secondary | ICD-10-CM | POA: Insufficient documentation

## 2019-11-13 DIAGNOSIS — D631 Anemia in chronic kidney disease: Secondary | ICD-10-CM | POA: Diagnosis not present

## 2019-11-13 DIAGNOSIS — I509 Heart failure, unspecified: Secondary | ICD-10-CM | POA: Diagnosis not present

## 2019-11-13 DIAGNOSIS — T82898A Other specified complication of vascular prosthetic devices, implants and grafts, initial encounter: Secondary | ICD-10-CM

## 2019-11-13 HISTORY — PX: A/V FISTULAGRAM: CATH118298

## 2019-11-13 LAB — POTASSIUM (ARMC VASCULAR LAB ONLY): Potassium (ARMC vascular lab): 4.5 (ref 3.5–5.1)

## 2019-11-13 LAB — GLUCOSE, CAPILLARY: Glucose-Capillary: 80 mg/dL (ref 70–99)

## 2019-11-13 SURGERY — A/V FISTULAGRAM
Anesthesia: Moderate Sedation | Laterality: Right

## 2019-11-13 MED ORDER — MIDAZOLAM HCL 2 MG/2ML IJ SOLN
INTRAMUSCULAR | Status: DC | PRN
  Administered 2019-11-13 (×2): 1 mg via INTRAVENOUS

## 2019-11-13 MED ORDER — DIPHENHYDRAMINE HCL 50 MG/ML IJ SOLN: 50.0000 mg | Freq: Once | INTRAMUSCULAR | Status: DC | PRN

## 2019-11-13 MED ORDER — CEFAZOLIN SODIUM-DEXTROSE 1-4 GM/50ML-% IV SOLN: 1.0000 g | Freq: Once | INTRAVENOUS | Status: AC

## 2019-11-13 MED ORDER — HEPARIN SODIUM (PORCINE) 1000 UNIT/ML IJ SOLN
INTRAMUSCULAR | Status: DC | PRN
  Administered 2019-11-13: 3000 [IU] via INTRAVENOUS

## 2019-11-13 MED ORDER — METHYLPREDNISOLONE SODIUM SUCC 125 MG IJ SOLR: 125.0000 mg | Freq: Once | INTRAMUSCULAR | Status: DC | PRN

## 2019-11-13 MED ORDER — FAMOTIDINE 20 MG PO TABS: 40.0000 mg | ORAL_TABLET | Freq: Once | ORAL | Status: DC | PRN

## 2019-11-13 MED ORDER — HEPARIN SODIUM (PORCINE) 1000 UNIT/ML IJ SOLN
INTRAMUSCULAR | Status: AC
  Filled 2019-11-13: qty 1

## 2019-11-13 MED ORDER — MIDAZOLAM HCL 5 MG/5ML IJ SOLN
INTRAMUSCULAR | Status: AC
  Filled 2019-11-13: qty 5

## 2019-11-13 MED ORDER — HYDROMORPHONE HCL 1 MG/ML IJ SOLN: 1.0000 mg | Freq: Once | INTRAMUSCULAR | Status: DC | PRN

## 2019-11-13 MED ORDER — MIDAZOLAM HCL 2 MG/ML PO SYRP: 8.0000 mg | ORAL_SOLUTION | Freq: Once | ORAL | Status: DC | PRN

## 2019-11-13 MED ORDER — CEFAZOLIN SODIUM-DEXTROSE 1-4 GM/50ML-% IV SOLN
INTRAVENOUS | Status: AC
  Administered 2019-11-13: 14:00:00 1 g via INTRAVENOUS
  Filled 2019-11-13: qty 50

## 2019-11-13 MED ORDER — FENTANYL CITRATE (PF) 100 MCG/2ML IJ SOLN
INTRAMUSCULAR | Status: DC | PRN
  Administered 2019-11-13: 25 ug via INTRAVENOUS
  Administered 2019-11-13: 50 ug via INTRAVENOUS

## 2019-11-13 MED ORDER — SODIUM CHLORIDE 0.9 % IV SOLN: INTRAVENOUS | Status: DC

## 2019-11-13 MED ORDER — FENTANYL CITRATE (PF) 100 MCG/2ML IJ SOLN
INTRAMUSCULAR | Status: AC
  Filled 2019-11-13: qty 2

## 2019-11-13 MED ORDER — ONDANSETRON HCL 4 MG/2ML IJ SOLN: 4.0000 mg | Freq: Four times a day (QID) | INTRAMUSCULAR | Status: DC | PRN

## 2019-11-13 SURGICAL SUPPLY — 11 items
BALLN ULTRVRSE 8X60X75C (BALLOONS) ×2
BALLOON ULTRVRSE 8X60X75C (BALLOONS) ×1 IMPLANT
DEVICE PRESTO INFLATION (MISCELLANEOUS) ×2 IMPLANT
NEEDLE ENTRY 21GA 7CM ECHOTIP (NEEDLE) ×2 IMPLANT
PACK ANGIOGRAPHY (CUSTOM PROCEDURE TRAY) ×2 IMPLANT
SET INTRO CAPELLA COAXIAL (SET/KITS/TRAYS/PACK) ×2 IMPLANT
SHEATH BRITE TIP 6FRX5.5 (SHEATH) ×2 IMPLANT
SUT MNCRL 4-0 (SUTURE) ×1
SUT MNCRL 4-0 27XMFL (SUTURE) ×1
SUTURE MNCRL 4-0 27XMF (SUTURE) ×1 IMPLANT
WIRE MAGIC TOR.035 180C (WIRE) ×2 IMPLANT

## 2019-11-13 NOTE — Op Note (Signed)
[facility] History & Physical Update  The patient was interviewed and re-examined.  The patient's previous History and Physical has been reviewed and is unchanged.  There is no change in the plan of care. We plan to proceed with the scheduled procedure.  Hortencia Pilar, MD  11/13/2019, 2:01 PM

## 2019-11-13 NOTE — Op Note (Signed)
OPERATIVE NOTE   PROCEDURE: 1. Contrast injection right arm  AV access 2. Percutaneous transluminal angioplasty midportion of the right arm AV graft  PRE-OPERATIVE DIAGNOSIS: Complication of dialysis access                                                       End Stage Renal Disease  POST-OPERATIVE DIAGNOSIS: same as above   SURGEON: Katha Cabal, M.D.  ANESTHESIA: Conscious sedation was administered under my direct supervision by the interventional radiology RN. IV Versed plus fentanyl were utilized. Continuous ECG, pulse oximetry and blood pressure was monitored throughout the entire procedure.  Conscious sedation was for a total of 30 minutes.  ESTIMATED BLOOD LOSS: minimal  FINDING(S): Stricture of the AV graft  SPECIMEN(S):  None  CONTRAST: 20 cc  FLUOROSCOPY TIME: 0.7 minutes  INDICATIONS: Stacy Pham is a 86 y.o. female who  presents with malfunctioning right arm AV access.  The patient is scheduled for angiography with possible intervention of the AV access.  The patient is aware the risks include but are not limited to: bleeding, infection, thrombosis of the cannulated access, and possible anaphylactic reaction to the contrast.  The patient acknowledges if the access can not be salvaged a tunneled catheter will be needed and will be placed during this procedure.  The patient is aware of the risks of the procedure and elects to proceed with the angiogram and intervention.  DESCRIPTION: After full informed written consent was obtained, the patient was brought back to the Special Procedure suite and placed supine position.  Appropriate cardiopulmonary monitors were placed.  The right arm was prepped and draped in the standard fashion.  Appropriate timeout is called. The right brachial axillary AV graft was cannulated with a micropuncture needle.  Cannulation was performed with ultrasound guidance. Ultrasound was placed in a sterile sleeve, the AV access was interrogated  and noted to be echolucent and compressible indicating patency. Image was recorded for the permanent record. The puncture is performed under continuous ultrasound visualization.   The microwire was advanced and the needle was exchanged for  a microsheath.  The J-wire was then advanced and a 6 Fr sheath inserted.  Hand injections were completed to image the access from the arterial anastomosis through the entire access.  The central venous structures were also imaged by hand injections.  Diagnostic interpretation: The AV graft demonstrates a greater than 70% stricture in its midportion.  The venous outflow is widely patent as is the central veins.  Later in the case reflux imaging demonstrates the arterial anastomosis is widely patent and the visualized portions of the brachial artery are widely patent.  Based on the images,  3000 units of heparin was given and a wire was negotiated through the strictures within the venous portion of the graft.  An 8 mm x 40 mm Ultraverse balloon was used.  Inflation was to 14 atm for 1 minute.  Follow-up imaging demonstrates complete resolution of the stricture, less than 5% residual stenosis, with rapid flow of contrast through the graft, the central venous anatomy is preserved.  A 4-0 Monocryl purse-string suture was sewn around the sheath.  The sheath was removed and light pressure was applied.  A sterile bandage was applied to the puncture site.    COMPLICATIONS: None  CONDITION: Stacy Pham.  Jenin Birdsall, M.D  [facility] Office: [phone]  11/13/2019 3:00 PM

## 2019-11-14 ENCOUNTER — Encounter: Payer: Self-pay | Admitting: Cardiology

## 2019-11-15 DIAGNOSIS — N2581 Secondary hyperparathyroidism of renal origin: Secondary | ICD-10-CM | POA: Diagnosis not present

## 2019-11-15 DIAGNOSIS — N186 End stage renal disease: Secondary | ICD-10-CM | POA: Diagnosis not present

## 2019-11-15 DIAGNOSIS — D509 Iron deficiency anemia, unspecified: Secondary | ICD-10-CM | POA: Diagnosis not present

## 2019-11-15 DIAGNOSIS — Z992 Dependence on renal dialysis: Secondary | ICD-10-CM | POA: Diagnosis not present

## 2019-11-15 DIAGNOSIS — D631 Anemia in chronic kidney disease: Secondary | ICD-10-CM | POA: Diagnosis not present

## 2019-11-16 ENCOUNTER — Other Ambulatory Visit
Admission: RE | Admit: 2019-11-16 | Discharge: 2019-11-16 | Disposition: A | Discharge status: Home / Self Care | Payer: Medicare Other | Source: Ambulatory Visit | Attending: Vascular Surgery | Admitting: Vascular Surgery

## 2019-11-16 DIAGNOSIS — Z9842 Cataract extraction status, left eye: Secondary | ICD-10-CM | POA: Diagnosis not present

## 2019-11-16 DIAGNOSIS — Z01812 Encounter for preprocedural laboratory examination: Secondary | ICD-10-CM | POA: Insufficient documentation

## 2019-11-16 DIAGNOSIS — Z20822 Contact with and (suspected) exposure to covid-19: Secondary | ICD-10-CM | POA: Insufficient documentation

## 2019-11-16 DIAGNOSIS — E119 Type 2 diabetes mellitus without complications: Secondary | ICD-10-CM | POA: Diagnosis not present

## 2019-11-16 DIAGNOSIS — Z9841 Cataract extraction status, right eye: Secondary | ICD-10-CM | POA: Diagnosis not present

## 2019-11-16 DIAGNOSIS — H35013 Changes in retinal vascular appearance, bilateral: Secondary | ICD-10-CM | POA: Diagnosis not present

## 2019-11-16 LAB — SARS CORONAVIRUS 2 (TAT 6-24 HRS): SARS Coronavirus 2: NEGATIVE

## 2019-11-17 DIAGNOSIS — D631 Anemia in chronic kidney disease: Secondary | ICD-10-CM | POA: Diagnosis not present

## 2019-11-17 DIAGNOSIS — D509 Iron deficiency anemia, unspecified: Secondary | ICD-10-CM | POA: Diagnosis not present

## 2019-11-17 DIAGNOSIS — N186 End stage renal disease: Secondary | ICD-10-CM | POA: Diagnosis not present

## 2019-11-17 DIAGNOSIS — N2581 Secondary hyperparathyroidism of renal origin: Secondary | ICD-10-CM | POA: Diagnosis not present

## 2019-11-17 DIAGNOSIS — Z992 Dependence on renal dialysis: Secondary | ICD-10-CM | POA: Diagnosis not present

## 2019-11-19 ENCOUNTER — Other Ambulatory Visit (INDEPENDENT_AMBULATORY_CARE_PROVIDER_SITE_OTHER): Payer: Self-pay | Admitting: Nurse Practitioner

## 2019-11-20 ENCOUNTER — Other Ambulatory Visit: Payer: Self-pay

## 2019-11-20 ENCOUNTER — Encounter
Admission: RE | Disposition: A | Discharge status: Home / Self Care | Payer: Self-pay | Source: Home / Self Care | Attending: Vascular Surgery

## 2019-11-20 ENCOUNTER — Encounter: Payer: Self-pay | Admitting: Vascular Surgery

## 2019-11-20 ENCOUNTER — Ambulatory Visit
Admission: RE | Admit: 2019-11-20 | Discharge: 2019-11-20 | Disposition: A | Discharge status: Home / Self Care | Payer: Medicare Other | Attending: Vascular Surgery | Admitting: Vascular Surgery

## 2019-11-20 DIAGNOSIS — Z95828 Presence of other vascular implants and grafts: Secondary | ICD-10-CM | POA: Insufficient documentation

## 2019-11-20 DIAGNOSIS — Z8249 Family history of ischemic heart disease and other diseases of the circulatory system: Secondary | ICD-10-CM | POA: Insufficient documentation

## 2019-11-20 DIAGNOSIS — M1990 Unspecified osteoarthritis, unspecified site: Secondary | ICD-10-CM | POA: Diagnosis not present

## 2019-11-20 DIAGNOSIS — E1122 Type 2 diabetes mellitus with diabetic chronic kidney disease: Secondary | ICD-10-CM | POA: Diagnosis not present

## 2019-11-20 DIAGNOSIS — I480 Paroxysmal atrial fibrillation: Secondary | ICD-10-CM | POA: Diagnosis not present

## 2019-11-20 DIAGNOSIS — D509 Iron deficiency anemia, unspecified: Secondary | ICD-10-CM | POA: Diagnosis not present

## 2019-11-20 DIAGNOSIS — E11621 Type 2 diabetes mellitus with foot ulcer: Secondary | ICD-10-CM | POA: Insufficient documentation

## 2019-11-20 DIAGNOSIS — I5030 Unspecified diastolic (congestive) heart failure: Secondary | ICD-10-CM | POA: Insufficient documentation

## 2019-11-20 DIAGNOSIS — N186 End stage renal disease: Secondary | ICD-10-CM | POA: Diagnosis not present

## 2019-11-20 DIAGNOSIS — E1152 Type 2 diabetes mellitus with diabetic peripheral angiopathy with gangrene: Secondary | ICD-10-CM | POA: Insufficient documentation

## 2019-11-20 DIAGNOSIS — D631 Anemia in chronic kidney disease: Secondary | ICD-10-CM | POA: Diagnosis not present

## 2019-11-20 DIAGNOSIS — E782 Mixed hyperlipidemia: Secondary | ICD-10-CM | POA: Insufficient documentation

## 2019-11-20 DIAGNOSIS — I70262 Atherosclerosis of native arteries of extremities with gangrene, left leg: Secondary | ICD-10-CM | POA: Insufficient documentation

## 2019-11-20 DIAGNOSIS — I70299 Other atherosclerosis of native arteries of extremities, unspecified extremity: Secondary | ICD-10-CM

## 2019-11-20 DIAGNOSIS — L97909 Non-pressure chronic ulcer of unspecified part of unspecified lower leg with unspecified severity: Secondary | ICD-10-CM

## 2019-11-20 DIAGNOSIS — L97529 Non-pressure chronic ulcer of other part of left foot with unspecified severity: Secondary | ICD-10-CM | POA: Diagnosis not present

## 2019-11-20 DIAGNOSIS — Z992 Dependence on renal dialysis: Secondary | ICD-10-CM | POA: Insufficient documentation

## 2019-11-20 DIAGNOSIS — E1136 Type 2 diabetes mellitus with diabetic cataract: Secondary | ICD-10-CM | POA: Diagnosis not present

## 2019-11-20 DIAGNOSIS — I130 Hypertensive heart and chronic kidney disease with heart failure and stage 1 through stage 4 chronic kidney disease, or unspecified chronic kidney disease: Secondary | ICD-10-CM | POA: Insufficient documentation

## 2019-11-20 DIAGNOSIS — N2581 Secondary hyperparathyroidism of renal origin: Secondary | ICD-10-CM | POA: Diagnosis not present

## 2019-11-20 HISTORY — PX: LOWER EXTREMITY ANGIOGRAPHY: CATH118251

## 2019-11-20 LAB — POTASSIUM (ARMC VASCULAR LAB ONLY): Potassium (ARMC vascular lab): 5.3 — ABNORMAL HIGH (ref 3.5–5.1)

## 2019-11-20 LAB — GLUCOSE, CAPILLARY
Glucose-Capillary: 74 mg/dL (ref 70–99)
Glucose-Capillary: 96 mg/dL (ref 70–99)

## 2019-11-20 SURGERY — LOWER EXTREMITY ANGIOGRAPHY
Anesthesia: Moderate Sedation | Laterality: Left

## 2019-11-20 MED ORDER — SODIUM CHLORIDE 0.9 % IV SOLN: 250.0000 mL | INTRAVENOUS | Status: DC | PRN

## 2019-11-20 MED ORDER — DIPHENHYDRAMINE HCL 50 MG/ML IJ SOLN: 50.0000 mg | Freq: Once | INTRAMUSCULAR | Status: DC | PRN

## 2019-11-20 MED ORDER — FENTANYL CITRATE (PF) 100 MCG/2ML IJ SOLN
INTRAMUSCULAR | Status: AC
  Filled 2019-11-20: qty 2

## 2019-11-20 MED ORDER — HYDROMORPHONE HCL 1 MG/ML IJ SOLN: 1.0000 mg | Freq: Once | INTRAMUSCULAR | Status: DC | PRN

## 2019-11-20 MED ORDER — DEXTROSE 50 % IV SOLN
INTRAVENOUS | Status: AC
  Administered 2019-11-20: 12.5 g via INTRAVENOUS
  Filled 2019-11-20: qty 50

## 2019-11-20 MED ORDER — ONDANSETRON HCL 4 MG/2ML IJ SOLN: 4.0000 mg | Freq: Four times a day (QID) | INTRAMUSCULAR | Status: DC | PRN

## 2019-11-20 MED ORDER — MORPHINE SULFATE (PF) 4 MG/ML IV SOLN: 2.0000 mg | INTRAVENOUS | Status: DC | PRN

## 2019-11-20 MED ORDER — FAMOTIDINE 20 MG PO TABS: 40.0000 mg | ORAL_TABLET | Freq: Once | ORAL | Status: DC | PRN

## 2019-11-20 MED ORDER — IODIXANOL 320 MG/ML IV SOLN
INTRAVENOUS | Status: DC | PRN
  Administered 2019-11-20: 14:00:00 60 mL

## 2019-11-20 MED ORDER — SODIUM CHLORIDE 0.9 % IV SOLN: INTRAVENOUS | Status: DC

## 2019-11-20 MED ORDER — FENTANYL CITRATE (PF) 100 MCG/2ML IJ SOLN
INTRAMUSCULAR | Status: DC | PRN
  Administered 2019-11-20: 25 ug via INTRAVENOUS
  Administered 2019-11-20: 50 ug via INTRAVENOUS
  Administered 2019-11-20: 25 ug via INTRAVENOUS

## 2019-11-20 MED ORDER — SODIUM CHLORIDE 0.9% FLUSH: 3.0000 mL | INTRAVENOUS | Status: DC | PRN

## 2019-11-20 MED ORDER — MIDAZOLAM HCL 2 MG/ML PO SYRP: 8.0000 mg | ORAL_SOLUTION | Freq: Once | ORAL | Status: DC | PRN

## 2019-11-20 MED ORDER — MIDAZOLAM HCL 5 MG/5ML IJ SOLN
INTRAMUSCULAR | Status: AC
  Filled 2019-11-20: qty 5

## 2019-11-20 MED ORDER — SODIUM CHLORIDE 0.9% FLUSH: 3.0000 mL | Freq: Two times a day (BID) | INTRAVENOUS | Status: DC

## 2019-11-20 MED ORDER — HYDRALAZINE HCL 20 MG/ML IJ SOLN: 5.0000 mg | INTRAMUSCULAR | Status: DC | PRN

## 2019-11-20 MED ORDER — OXYCODONE HCL 5 MG PO TABS: 5.0000 mg | ORAL_TABLET | ORAL | Status: DC | PRN

## 2019-11-20 MED ORDER — ACETAMINOPHEN 325 MG PO TABS: 650.0000 mg | ORAL_TABLET | ORAL | Status: DC | PRN

## 2019-11-20 MED ORDER — LABETALOL HCL 5 MG/ML IV SOLN: 10.0000 mg | INTRAVENOUS | Status: DC | PRN

## 2019-11-20 MED ORDER — HEPARIN SODIUM (PORCINE) 1000 UNIT/ML IJ SOLN
INTRAMUSCULAR | Status: AC
  Filled 2019-11-20: qty 1

## 2019-11-20 MED ORDER — MIDAZOLAM HCL 2 MG/2ML IJ SOLN
INTRAMUSCULAR | Status: DC | PRN
  Administered 2019-11-20: 1 mg via INTRAVENOUS
  Administered 2019-11-20: 2 mg via INTRAVENOUS

## 2019-11-20 MED ORDER — DEXTROSE 50 % IV SOLN: 12.5000 g | Freq: Once | INTRAVENOUS | Status: AC

## 2019-11-20 MED ORDER — HEPARIN SODIUM (PORCINE) 1000 UNIT/ML IJ SOLN
INTRAMUSCULAR | Status: DC | PRN
  Administered 2019-11-20: 4000 [IU] via INTRAVENOUS

## 2019-11-20 MED ORDER — METHYLPREDNISOLONE SODIUM SUCC 125 MG IJ SOLR: 125.0000 mg | Freq: Once | INTRAMUSCULAR | Status: DC | PRN

## 2019-11-20 MED ORDER — CEFAZOLIN SODIUM-DEXTROSE 1-4 GM/50ML-% IV SOLN
1.0000 g | Freq: Once | INTRAVENOUS | Status: AC
  Administered 2019-11-20: 1 g via INTRAVENOUS

## 2019-11-20 SURGICAL SUPPLY — 26 items
BALLN LUTONIX  018 4X60X130 (BALLOONS) ×4
BALLN LUTONIX 018 4X60X130 (BALLOONS) ×2
BALLN ULTRASCORE 014 3X40X150 (BALLOONS) ×3
BALLOON LUTONIX 018 4X60X130 (BALLOONS) ×2 IMPLANT
BALLOON ULTRSCRE 014 3X40X150 (BALLOONS) ×1 IMPLANT
CATH BEACON 5 .035 65 RIM TIP (CATHETERS) ×3 IMPLANT
CATH PIG 70CM (CATHETERS) ×3 IMPLANT
CATH SEEKER .035X135CM (CATHETERS) ×3 IMPLANT
CATH VERT 5FR 125CM (CATHETERS) ×3 IMPLANT
DEVICE PRESTO INFLATION (MISCELLANEOUS) ×3 IMPLANT
DEVICE STARCLOSE SE CLOSURE (Vascular Products) ×3 IMPLANT
DEVICE TORQUE .025-.038 (MISCELLANEOUS) ×3 IMPLANT
GLIDEWIRE ADV .035X260CM (WIRE) ×3 IMPLANT
NEEDLE ENTRY 21GA 7CM ECHOTIP (NEEDLE) ×3 IMPLANT
PACK ANGIOGRAPHY (CUSTOM PROCEDURE TRAY) ×3 IMPLANT
SET INTRO CAPELLA COAXIAL (SET/KITS/TRAYS/PACK) ×3 IMPLANT
SHEATH BRITE TIP 5FRX11 (SHEATH) ×3 IMPLANT
SHEATH RAABE 6FR (SHEATH) ×3 IMPLANT
SHIELD X-DRAPE GOLD 12X17 (MISCELLANEOUS) ×3 IMPLANT
STENT LIFESTENT 5F 5X60X135 (Permanent Stent) ×3 IMPLANT
SYR MEDRAD MARK 7 150ML (SYRINGE) ×3 IMPLANT
TUBING CONTRAST HIGH PRESS 72 (TUBING) ×3 IMPLANT
WIRE G V18X300CM (WIRE) ×3 IMPLANT
WIRE J 3MM .035X145CM (WIRE) ×3 IMPLANT
WIRE ROSEN-J .035X260CM (WIRE) ×3 IMPLANT
WIRE RUNTHROUGH .014X300CM (WIRE) ×3 IMPLANT

## 2019-11-20 NOTE — Op Note (Signed)
[facility] Percutaneous Study/Intervention Procedural Note   Date of Surgery: 11/20/2019  Surgeon:  Katha Cabal, MD.  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 Introduction catheter into left lower extremity 3rd order catheter placement  2. Contrast injection left lower extremity for distal runoff   3. Percutaneous transluminal angioplasty and stent placement left peroneal and tibioperoneal trunk with a 5 x 60 life stent postdilated to 4 mm with a Lutonix drug-eluting balloon 4. Star close closure right common femoral arteriotomy  Anesthesia: Conscious sedation was administered under my direct supervision by the interventional radiology RN. IV Versed plus fentanyl were utilized. Continuous ECG, pulse oximetry and blood pressure was monitored throughout the entire procedure.  Conscious sedation was for a total of 70 minutes.  Sheath: 6 French Raby right common femoral retrograde  Contrast: 60 cc  Fluoroscopy Time: 11.8 minutes  Indications: Stacy Pham presents with gangrene of the left great toe.  Her ABI is not improved after her last intervention.  She will require toe amputation and therefore is undergoing angiography with the hope for further intervention to improve her circulation and provide for wound healing.  This is a limb salvage effort.  The risks and benefits are reviewed all questions answered patient agrees to proceed.  Procedure: Stacy Pham is a 86 y.o. y.o. female who was identified and appropriate procedural time out was performed. The patient was then placed supine on the table and prepped and draped in the usual sterile fashion.   Ultrasound was placed in the sterile sleeve and the right groin was evaluated the right common femoral  artery was echolucent and pulsatile indicating patency.  Image was recorded for the permanent record and under real-time visualization a microneedle was inserted into the common femoral artery microwire followed by a micro-sheath.  A J-wire was then advanced through the micro-sheath and a  5 [place] sheath was then inserted over a J-wire. J-wire was then advanced and a 5 French pigtail catheter was positioned at the level of T12. AP projection of the aorta was then obtained. Pigtail catheter was repositioned to above the bifurcation and a RAO view of the pelvis was obtained.  Subsequently a rim catheter with the stiff angle advantage was used to cross the aortic bifurcation the catheter wire were advanced down into the left distal external iliac artery. Oblique view of the femoral bifurcation was then obtained and subsequently the wire was reintroduced and the pigtail catheter negotiated into the SFA representing third order catheter placement. Distal runoff was then performed.  5000 units of heparin was then given and allowed to circulate and a 6 [place] Raby sheath was advanced up and over the bifurcation and positioned in the left superficial femoral artery.  Diagnostic interpretation: The abdominal aorta is opacified with a bolus injection contrast.  There is diffuse atherosclerotic changes.  There are no hemodynamically significant stenoses.  The bilateral common and external iliac arteries are widely patent.  Again there is diffuse atherosclerotic changes but no hemodynamically significant stenoses.  The left common femoral profunda femoris are widely patent.  Diffuse atherosclerosis noted but no hemodynamically significant stenoses.  The left SFA is patent there is diffuse atherosclerotic changes.  There is a moderate lesion noted in the midportion.  This appears to be approximately 40%.  Previously placed stent is widely patent with less than 10% residual stenosis.  Distal popliteal is widely patent.  Trifurcation is diffusely diseased.  The anterior tibial occludes after its origin and remains occluded throughout its entire course.  There is no evidence reconstitution of a dorsalis pedis.  The tibioperoneal trunk demonstrates a greater than 50% narrowing which extends into the ostia and proximal centimeter of the peroneal.  It is actually significantly worse at the ostia of the peroneal almost 80%.  Below this the peroneal is widely patent with extensive collaterals to the foot filling the pedal arch.  The posterior tibial is occluded at its origin remains occluded throughout its course down to the ankle.  There is reconstitution of a very diseased appearing posterior tibial the fills the plantar vessels.  The peroneal is clearly the dominant runoff to the foot with suitable collateralization for limb salvage.  KMP  catheter and advantage wire were then negotiated down into the distal popliteal and then across the stenosis in the tibioperoneal trunk and through the ostial peroneal lesion.  Distal runoff was then completed by hand injection through the catheter imaging the distal peroneal. The 0.014 run-through wire was then reintroduced and a 3 mm x 40 mm ultra score balloon was used to angioplasty the peroneal and tibioperoneal trunk. Inflation was to 12 atmospheres for 1 minute. Follow-up imaging demonstrated patency with adequate preparation of the vessel for a drug-coated balloon.  Subsequently, a 4 mm x 60 mm Lutonix balloon was utilized inflating to 12 atm for 2 full minutes. Follow-up imaging demonstrated greater than 50% residual stenosis and therefore a 5 mm x 60 mm life stent was deployed and subsequently postdilated with a 4 mm x 60 mm Lutonix drug-eluting balloon.  Inflation was to 10 atm for 1 minute.  Follow-up imaging was then obtained which demonstrated less than 5% residual stenosis throughout the tibioperoneal trunk and proximal peroneal.  Distal runoff was then reassessed and found to be  widely patent.  After review of these images the sheath is pulled into the right external iliac oblique of the common femoral is obtained and a Star close device deployed. There no immediate complications.   Findings: The abdominal aorta is opacified with a bolus injection contrast.  There is diffuse atherosclerotic changes.  There are no hemodynamically significant stenoses.  The bilateral common and external iliac arteries are widely patent.  Again there is diffuse atherosclerotic changes but no hemodynamically significant stenoses.  The left common femoral profunda femoris are widely patent.  Diffuse atherosclerosis noted but no hemodynamically significant stenoses.  The left SFA is patent there is diffuse atherosclerotic changes.  There is a moderate lesion noted in the midportion.  This appears to be approximately 40%.  Previously placed stent is widely patent with less than 10% residual stenosis.  Distal popliteal is widely patent.  Trifurcation is diffusely diseased.  The anterior tibial occludes after its origin and remains occluded throughout its entire course.  There is no evidence reconstitution of a dorsalis pedis.  The tibioperoneal trunk demonstrates a greater than 50% narrowing which extends into the ostia and proximal centimeter of the peroneal.  It is actually significantly worse at the ostia of the peroneal almost 80%.  Below this the peroneal is widely patent with extensive collaterals to the foot filling the pedal arch.  The posterior tibial is occluded at its origin remains occluded throughout its course down to the ankle.  There is reconstitution of a very diseased appearing posterior tibial the fills the plantar vessels.  The peroneal is clearly the dominant runoff to the foot with suitable collateralization  for limb salvage.  Following angioplasty to 4 mm with a Lutonix drug-eluting balloon in the tibioperoneal trunk and peroneal demonstrated persisting greater than 50% stenosis.   Life stent is then deployed and postdilated with a 4 mm Lutonix balloon follow-up imaging demonstrates less than 5% residual stenosis with preservation of distal runoff.     Summary: Successful recanalization left lower extremity for limb salvage    Disposition: Patient was taken to the recovery room in stable condition having tolerated the procedure well.  Stacy Pham, Stacy Pham 11/20/2019,2:06 PM

## 2019-11-21 ENCOUNTER — Encounter: Payer: Self-pay | Admitting: Cardiology

## 2019-11-22 DIAGNOSIS — Z992 Dependence on renal dialysis: Secondary | ICD-10-CM | POA: Diagnosis not present

## 2019-11-22 DIAGNOSIS — N186 End stage renal disease: Secondary | ICD-10-CM | POA: Diagnosis not present

## 2019-11-22 DIAGNOSIS — D631 Anemia in chronic kidney disease: Secondary | ICD-10-CM | POA: Diagnosis not present

## 2019-11-22 DIAGNOSIS — D509 Iron deficiency anemia, unspecified: Secondary | ICD-10-CM | POA: Diagnosis not present

## 2019-11-22 DIAGNOSIS — N2581 Secondary hyperparathyroidism of renal origin: Secondary | ICD-10-CM | POA: Diagnosis not present

## 2019-11-24 DIAGNOSIS — D509 Iron deficiency anemia, unspecified: Secondary | ICD-10-CM | POA: Diagnosis not present

## 2019-11-24 DIAGNOSIS — N186 End stage renal disease: Secondary | ICD-10-CM | POA: Diagnosis not present

## 2019-11-24 DIAGNOSIS — N2581 Secondary hyperparathyroidism of renal origin: Secondary | ICD-10-CM | POA: Diagnosis not present

## 2019-11-24 DIAGNOSIS — Z992 Dependence on renal dialysis: Secondary | ICD-10-CM | POA: Diagnosis not present

## 2019-11-24 DIAGNOSIS — D631 Anemia in chronic kidney disease: Secondary | ICD-10-CM | POA: Diagnosis not present

## 2019-11-27 DIAGNOSIS — N2581 Secondary hyperparathyroidism of renal origin: Secondary | ICD-10-CM | POA: Diagnosis not present

## 2019-11-27 DIAGNOSIS — N186 End stage renal disease: Secondary | ICD-10-CM | POA: Diagnosis not present

## 2019-11-27 DIAGNOSIS — D631 Anemia in chronic kidney disease: Secondary | ICD-10-CM | POA: Diagnosis not present

## 2019-11-27 DIAGNOSIS — Z992 Dependence on renal dialysis: Secondary | ICD-10-CM | POA: Diagnosis not present

## 2019-11-27 DIAGNOSIS — D509 Iron deficiency anemia, unspecified: Secondary | ICD-10-CM | POA: Diagnosis not present

## 2019-11-28 ENCOUNTER — Ambulatory Visit (INDEPENDENT_AMBULATORY_CARE_PROVIDER_SITE_OTHER): Payer: Medicare Other | Admitting: Nurse Practitioner

## 2019-11-29 DIAGNOSIS — N186 End stage renal disease: Secondary | ICD-10-CM | POA: Diagnosis not present

## 2019-11-29 DIAGNOSIS — D631 Anemia in chronic kidney disease: Secondary | ICD-10-CM | POA: Diagnosis not present

## 2019-11-29 DIAGNOSIS — Z992 Dependence on renal dialysis: Secondary | ICD-10-CM | POA: Diagnosis not present

## 2019-11-29 DIAGNOSIS — N2581 Secondary hyperparathyroidism of renal origin: Secondary | ICD-10-CM | POA: Diagnosis not present

## 2019-11-29 DIAGNOSIS — D509 Iron deficiency anemia, unspecified: Secondary | ICD-10-CM | POA: Diagnosis not present

## 2019-12-01 DIAGNOSIS — D509 Iron deficiency anemia, unspecified: Secondary | ICD-10-CM | POA: Diagnosis not present

## 2019-12-01 DIAGNOSIS — D631 Anemia in chronic kidney disease: Secondary | ICD-10-CM | POA: Diagnosis not present

## 2019-12-01 DIAGNOSIS — N186 End stage renal disease: Secondary | ICD-10-CM | POA: Diagnosis not present

## 2019-12-01 DIAGNOSIS — N2581 Secondary hyperparathyroidism of renal origin: Secondary | ICD-10-CM | POA: Diagnosis not present

## 2019-12-01 DIAGNOSIS — Z992 Dependence on renal dialysis: Secondary | ICD-10-CM | POA: Diagnosis not present

## 2019-12-04 ENCOUNTER — Other Ambulatory Visit (INDEPENDENT_AMBULATORY_CARE_PROVIDER_SITE_OTHER): Payer: Self-pay | Admitting: Vascular Surgery

## 2019-12-04 DIAGNOSIS — D509 Iron deficiency anemia, unspecified: Secondary | ICD-10-CM | POA: Diagnosis not present

## 2019-12-04 DIAGNOSIS — N2581 Secondary hyperparathyroidism of renal origin: Secondary | ICD-10-CM | POA: Diagnosis not present

## 2019-12-04 DIAGNOSIS — I70262 Atherosclerosis of native arteries of extremities with gangrene, left leg: Secondary | ICD-10-CM

## 2019-12-04 DIAGNOSIS — Z992 Dependence on renal dialysis: Secondary | ICD-10-CM | POA: Diagnosis not present

## 2019-12-04 DIAGNOSIS — Z95820 Peripheral vascular angioplasty status with implants and grafts: Secondary | ICD-10-CM

## 2019-12-04 DIAGNOSIS — D631 Anemia in chronic kidney disease: Secondary | ICD-10-CM | POA: Diagnosis not present

## 2019-12-04 DIAGNOSIS — N186 End stage renal disease: Secondary | ICD-10-CM | POA: Diagnosis not present

## 2019-12-05 ENCOUNTER — Ambulatory Visit (INDEPENDENT_AMBULATORY_CARE_PROVIDER_SITE_OTHER): Payer: Medicare Other | Admitting: Nurse Practitioner

## 2019-12-05 ENCOUNTER — Encounter (INDEPENDENT_AMBULATORY_CARE_PROVIDER_SITE_OTHER): Payer: Medicare Other

## 2019-12-05 ENCOUNTER — Other Ambulatory Visit: Payer: Self-pay

## 2019-12-05 ENCOUNTER — Ambulatory Visit (INDEPENDENT_AMBULATORY_CARE_PROVIDER_SITE_OTHER): Payer: Medicare Other

## 2019-12-05 ENCOUNTER — Encounter (INDEPENDENT_AMBULATORY_CARE_PROVIDER_SITE_OTHER): Payer: Self-pay | Admitting: Nurse Practitioner

## 2019-12-05 VITALS — BP 173/80 | HR 62 | Ht 63.0 in | Wt 150.0 lb

## 2019-12-05 DIAGNOSIS — E1159 Type 2 diabetes mellitus with other circulatory complications: Secondary | ICD-10-CM

## 2019-12-05 DIAGNOSIS — E782 Mixed hyperlipidemia: Secondary | ICD-10-CM | POA: Diagnosis not present

## 2019-12-05 DIAGNOSIS — I7025 Atherosclerosis of native arteries of other extremities with ulceration: Secondary | ICD-10-CM

## 2019-12-05 DIAGNOSIS — I70262 Atherosclerosis of native arteries of extremities with gangrene, left leg: Secondary | ICD-10-CM

## 2019-12-05 DIAGNOSIS — Z95820 Peripheral vascular angioplasty status with implants and grafts: Secondary | ICD-10-CM

## 2019-12-06 DIAGNOSIS — Z992 Dependence on renal dialysis: Secondary | ICD-10-CM | POA: Diagnosis not present

## 2019-12-06 DIAGNOSIS — N186 End stage renal disease: Secondary | ICD-10-CM | POA: Diagnosis not present

## 2019-12-06 DIAGNOSIS — N2581 Secondary hyperparathyroidism of renal origin: Secondary | ICD-10-CM | POA: Diagnosis not present

## 2019-12-06 DIAGNOSIS — D631 Anemia in chronic kidney disease: Secondary | ICD-10-CM | POA: Diagnosis not present

## 2019-12-06 DIAGNOSIS — D509 Iron deficiency anemia, unspecified: Secondary | ICD-10-CM | POA: Diagnosis not present

## 2019-12-07 ENCOUNTER — Encounter (INDEPENDENT_AMBULATORY_CARE_PROVIDER_SITE_OTHER): Payer: Self-pay | Admitting: Nurse Practitioner

## 2019-12-07 NOTE — Progress Notes (Signed)
Subjective:    Patient ID: Stacy Pham, female    DOB: [birth year], 86 y.o.   MRN: [id number] Chief Complaint  Patient presents with  . Follow-up    U/S follow up    The patient returns to the office for followup and review status post angiogram with intervention. The patient notes improvement in the lower extremity symptoms of her right lower extremity, she feels as if the feeling has improved.  The patient's left lower extremity continues to have a necrotic great toe.  The patient also notes that recently she lost her toenail on her right foot with a ulceration underneath.  The patient has underwent multiple angiograms related to her peripheral arterial disease recently.  She recently had intervention on the left lower extremity on 09/11/2019 as well as 11/20/2019.  The right lower extremity had intervention done on 10/17/2019.  There have been no significant changes to the patient's overall health care.  The patient denies amaurosis fugax or recent TIA symptoms. There are no recent neurological changes noted. The patient denies history of DVT, PE or superficial thrombophlebitis. The patient denies recent episodes of angina or shortness of breath.   ABI's Rt=0.65 and Lt=0.76  (previous ABI's Rt=0.75 and Lt=0.76) Duplex US of the right tibial arteries reveals monophasic waveforms throughout with retrograde flow seen within the right anterior tibial artery.  The left lower extremity has biphasic waveforms throughout the left lower extremity until the distal popliteal artery, where it transitions to monophasic waveforms.  This continues through the left lower extremity tibial arteries.   Review of Systems  Cardiovascular: Positive for leg swelling.  Skin: Positive for wound.  Neurological: Positive for weakness and numbness.  All other systems reviewed and are negative.      Objective:   Physical Exam Vitals reviewed.  HENT:     Head: Normocephalic.  Cardiovascular:     Rate and  Rhythm: Normal rate and regular rhythm.     Heart sounds: Normal heart sounds.  Pulmonary:     Effort: Pulmonary effort is normal.     Breath sounds: Normal breath sounds.  Feet:     Right foot:     Skin integrity: Ulcer present.     Left foot:     Skin integrity: Dry skin present.  Neurological:     Mental Status: She is alert and oriented to person, place, and time.  Psychiatric:        Mood and Affect: Mood normal.        Behavior: Behavior normal.        Thought Content: Thought content normal.        Judgment: Judgment normal.     BP (!) 173/80   Pulse 62   Ht '5\' 3"'  (1.6 m)   Wt 150 lb (68 kg)   BMI 26.57 kg/m   Past Medical History:  Diagnosis Date  . (HFpEF) heart failure with preserved ejection fraction ([place])    a. 10/2017 Echo: EF 60-65%, Gr1 DD, mild MR, mildly dil LA/RA. PASP 45mHg.  . Arthritis   . Back pain   . Bronchitis   . Cataract   . Diabetes mellitus without complication (HCC)    no meds currently  . [place]   . ESRD (end stage renal disease) ([place]    a. 2018 - initially on HD but then transitioned to nightly PD in 08/2017.  .Marland KitchenGout   . Hypertension   . PAF (paroxysmal atrial fibrillation) ([place]  a. CHA2DS2VASc = 5-->eliquis 2.5 BID.    Social History   Socioeconomic History  . Marital status: Single    Spouse name: Not on file  . Number of children: 3  . Years of education: Not on file  . Highest education level: Not on file  Occupational History  . Not on file  Tobacco Use  . Smoking status: Never Smoker  . Smokeless tobacco: Never Used  Substance and Sexual Activity  . Alcohol use: No  . Drug use: No  . Sexual activity: Not on file  Other Topics Concern  . Not on file  Social History Narrative   Retired. Lives in [place] with her dtr who takes care of her.  Pt uses a walker to get around, though admits that ambulation over all is limited.   Social Determinants of Health   Financial Resource Strain: Low Risk    . Difficulty of Paying Living Expenses: Not hard at all  Food Insecurity: No Food Insecurity  . Worried About Charity fundraiser in the Last Year: Never true  . Ran Out of Food in the Last Year: Never true  Transportation Needs: No Transportation Needs  . Lack of Transportation (Medical): No  . Lack of Transportation (Non-Medical): No  Physical Activity: Inactive  . Days of Exercise per Week: 0 days  . Minutes of Exercise per Session: 0 min  Stress: No Stress Concern Present  . Feeling of Stress : Not at all  Social Connections:   . Frequency of Communication with Friends and Family:   . Frequency of Social Gatherings with Friends and Family:   . Attends Religious Services:   . Active Member of Clubs or Organizations:   . Attends Archivist Meetings:   [name] Marital Status:   Intimate Partner Violence: Not At Risk  . Fear of Current or Ex-Partner: No  . Emotionally Abused: No  . Physically Abused: No  . Sexually Abused: No    Past Surgical History:  Procedure Laterality Date  . A/V FISTULAGRAM Right 11/13/2019   Procedure: A/V FISTULAGRAM;  Surgeon: Katha Cabal, MD;  Location: [facility];  Service: Cardiovascular;  Laterality: Right;  . AV FISTULA PLACEMENT Right 09/13/2018   Procedure: INSERTION OF ARTERIOVENOUS (AV) GORE-TEX GRAFT ARM;  Surgeon: Katha Cabal, MD;  Location: [facility];  Service: Vascular;  Laterality: Right;  . CAPD INSERTION N/A 06/08/2017   Procedure: LAPAROSCOPIC INSERTION CONTINUOUS AMBULATORY PERITONEAL DIALYSIS  (CAPD) CATHETER;  Surgeon: Katha Cabal, MD;  Location: [facility];  Service: Vascular;  Laterality: N/A;  . CATARACT EXTRACTION, BILATERAL Bilateral   . DIALYSIS/PERMA CATHETER INSERTION N/A 03/18/2017   Procedure: DIALYSIS/PERMA CATHETER INSERTION;  Surgeon: Katha Cabal, MD;  Location: [facility];  Service: Cardiovascular;  Laterality: N/A;  . DIALYSIS/PERMA CATHETER REMOVAL N/A 10/20/2017    Procedure: DIALYSIS/PERMA CATHETER REMOVAL;  Surgeon: Algernon Huxley, MD;  Location: [facility];  Service: Cardiovascular;  Laterality: N/A;  . EYE SURGERY    . LOWER EXTREMITY ANGIOGRAPHY Left 09/11/2019   Procedure: LOWER EXTREMITY ANGIOGRAPHY;  Surgeon: Katha Cabal, MD;  Location: [facility];  Service: Cardiovascular;  Laterality: Left;  . LOWER EXTREMITY ANGIOGRAPHY Right 10/17/2019   Procedure: LOWER EXTREMITY ANGIOGRAPHY;  Surgeon: Katha Cabal, MD;  Location: [facility];  Service: Cardiovascular;  Laterality: Right;  . LOWER EXTREMITY ANGIOGRAPHY Left 11/20/2019   Procedure: LOWER EXTREMITY ANGIOGRAPHY;  Surgeon: Katha Cabal, MD;  Location: [facility];  Service: Cardiovascular;  Laterality: Left;  . REMOVAL OF A DIALYSIS CATHETER N/A 02/23/2019   Procedure: REMOVAL OF A DIALYSIS CATHETER ( PD CATH);  Surgeon: Katha Cabal, MD;  Location: [facility];  Service: Vascular;  Laterality: N/A;    Family History  Problem Relation Age of Onset  . Hypertension Father     No Known Allergies     Assessment & Plan:   1. Atherosclerosis of native arteries of the extremities with ulceration ([place]) After discussion with the patient today, she states that following her most recent angiogram on her right lower extremity, her toes are feeling better.  At this time we will proceed cautiously before scheduling another angiogram on the right lower extremity.  We will plan on having the patient return in 2 weeks for evaluation of her toe wound to see if it is beginning to heal.  If it has worsened or shows evidence that it is not healing we may discuss the possibility of another angiogram of the right lower extremity.  The great toe of the left lower extremity is beginning to mummified however shows no signs of wet gangrene.   2. Type 2 diabetes mellitus with vascular disease ([place]) Continue hypoglycemic medications as already ordered, these medications  have been reviewed and there are no changes at this time.  Hgb A1C to be monitored as already arranged by primary service  3. Mixed hyperlipidemia Continue statin as ordered and reviewed, no changes at this time    Current Outpatient Medications on File Prior to Visit  Medication Sig Dispense Refill  . acetaminophen (TYLENOL 8 HOUR ARTHRITIS PAIN) 650 MG CR tablet Take 650 mg every 8 (eight) hours as needed by mouth for pain.    [name] allopurinol (ZYLOPRIM) 100 MG tablet TAKE 1 TABLET BY MOUTH EVERYDAY AT BEDTIME 90 tablet 3  . amiodarone (PACERONE) 200 MG tablet Take 1 tablet (200 mg total) by mouth every morning. Take 1 tablet (200 mg) by mouth once daily (Patient taking differently: Take 200 mg by mouth daily. ) 90 tablet 3  . atorvastatin (LIPITOR) 20 MG tablet TAKE 1 TABLET BY MOUTH EVERY DAY IN THE EVENING (Patient taking differently: Take 20 mg by mouth at bedtime. ) 90 tablet 0  . blood glucose meter kit and supplies KIT Please dispense either One Touch or Bayer Contour She must have one of these machines since she is on peritoneal dialysis. Use up to four times daily as directed. (FOR ICD-9 250.00, 250.01). 1 each 0  . clopidogrel (PLAVIX) 75 MG tablet Take 1 tablet (75 mg total) by mouth daily. 30 tablet 4  . ELIQUIS 2.5 MG TABS tablet TAKE 1 TABLET BY MOUTH TWICE A DAY (Patient taking differently: Take 2.5 mg by mouth 2 (two) times daily. ) 180 tablet 1  . metoprolol tartrate (LOPRESSOR) 25 MG tablet Take 0.5 tablets (12.5 mg total) by mouth 2 (two) times daily. 90 tablet 2  . traMADol (ULTRAM) 50 MG tablet Take 1 tablet (50 mg total) by mouth every 12 (twelve) hours as needed for moderate pain or severe pain. 50 tablet 0  . amLODipine (NORVASC) 5 MG tablet Take 1 tablet (5 mg total) by mouth daily. 90 tablet 2   No current facility-administered medications on file prior to visit.    There are no Patient Instructions on file for this visit. No follow-ups on file.   Kris Hartmann,  NP

## 2019-12-10 DIAGNOSIS — Z992 Dependence on renal dialysis: Secondary | ICD-10-CM | POA: Diagnosis not present

## 2019-12-10 DIAGNOSIS — N186 End stage renal disease: Secondary | ICD-10-CM | POA: Diagnosis not present

## 2019-12-11 DIAGNOSIS — N186 End stage renal disease: Secondary | ICD-10-CM | POA: Diagnosis not present

## 2019-12-11 DIAGNOSIS — D631 Anemia in chronic kidney disease: Secondary | ICD-10-CM | POA: Diagnosis not present

## 2019-12-11 DIAGNOSIS — D509 Iron deficiency anemia, unspecified: Secondary | ICD-10-CM | POA: Diagnosis not present

## 2019-12-11 DIAGNOSIS — N2581 Secondary hyperparathyroidism of renal origin: Secondary | ICD-10-CM | POA: Diagnosis not present

## 2019-12-11 DIAGNOSIS — Z992 Dependence on renal dialysis: Secondary | ICD-10-CM | POA: Diagnosis not present

## 2019-12-13 DIAGNOSIS — D631 Anemia in chronic kidney disease: Secondary | ICD-10-CM | POA: Diagnosis not present

## 2019-12-13 DIAGNOSIS — Z992 Dependence on renal dialysis: Secondary | ICD-10-CM | POA: Diagnosis not present

## 2019-12-13 DIAGNOSIS — N2581 Secondary hyperparathyroidism of renal origin: Secondary | ICD-10-CM | POA: Diagnosis not present

## 2019-12-13 DIAGNOSIS — N186 End stage renal disease: Secondary | ICD-10-CM | POA: Diagnosis not present

## 2019-12-13 DIAGNOSIS — D509 Iron deficiency anemia, unspecified: Secondary | ICD-10-CM | POA: Diagnosis not present

## 2019-12-15 DIAGNOSIS — D631 Anemia in chronic kidney disease: Secondary | ICD-10-CM | POA: Diagnosis not present

## 2019-12-15 DIAGNOSIS — Z992 Dependence on renal dialysis: Secondary | ICD-10-CM | POA: Diagnosis not present

## 2019-12-15 DIAGNOSIS — D509 Iron deficiency anemia, unspecified: Secondary | ICD-10-CM | POA: Diagnosis not present

## 2019-12-15 DIAGNOSIS — N186 End stage renal disease: Secondary | ICD-10-CM | POA: Diagnosis not present

## 2019-12-15 DIAGNOSIS — N2581 Secondary hyperparathyroidism of renal origin: Secondary | ICD-10-CM | POA: Diagnosis not present

## 2019-12-18 DIAGNOSIS — N186 End stage renal disease: Secondary | ICD-10-CM | POA: Diagnosis not present

## 2019-12-18 DIAGNOSIS — D631 Anemia in chronic kidney disease: Secondary | ICD-10-CM | POA: Diagnosis not present

## 2019-12-18 DIAGNOSIS — D509 Iron deficiency anemia, unspecified: Secondary | ICD-10-CM | POA: Diagnosis not present

## 2019-12-18 DIAGNOSIS — Z992 Dependence on renal dialysis: Secondary | ICD-10-CM | POA: Diagnosis not present

## 2019-12-18 DIAGNOSIS — N2581 Secondary hyperparathyroidism of renal origin: Secondary | ICD-10-CM | POA: Diagnosis not present

## 2019-12-20 ENCOUNTER — Other Ambulatory Visit: Payer: Self-pay

## 2019-12-20 ENCOUNTER — Encounter (INDEPENDENT_AMBULATORY_CARE_PROVIDER_SITE_OTHER): Payer: Self-pay | Admitting: Vascular Surgery

## 2019-12-20 ENCOUNTER — Ambulatory Visit (INDEPENDENT_AMBULATORY_CARE_PROVIDER_SITE_OTHER): Payer: Medicare Other | Admitting: Vascular Surgery

## 2019-12-20 VITALS — BP 192/80 | HR 80 | Ht 63.0 in | Wt 156.0 lb

## 2019-12-20 DIAGNOSIS — I480 Paroxysmal atrial fibrillation: Secondary | ICD-10-CM

## 2019-12-20 DIAGNOSIS — I7025 Atherosclerosis of native arteries of other extremities with ulceration: Secondary | ICD-10-CM | POA: Diagnosis not present

## 2019-12-20 DIAGNOSIS — T829XXS Unspecified complication of cardiac and vascular prosthetic device, implant and graft, sequela: Secondary | ICD-10-CM | POA: Diagnosis not present

## 2019-12-20 DIAGNOSIS — I10 Essential (primary) hypertension: Secondary | ICD-10-CM | POA: Diagnosis not present

## 2019-12-20 DIAGNOSIS — E1159 Type 2 diabetes mellitus with other circulatory complications: Secondary | ICD-10-CM

## 2019-12-20 DIAGNOSIS — D631 Anemia in chronic kidney disease: Secondary | ICD-10-CM | POA: Diagnosis not present

## 2019-12-20 DIAGNOSIS — N2581 Secondary hyperparathyroidism of renal origin: Secondary | ICD-10-CM | POA: Diagnosis not present

## 2019-12-20 DIAGNOSIS — D509 Iron deficiency anemia, unspecified: Secondary | ICD-10-CM | POA: Diagnosis not present

## 2019-12-20 DIAGNOSIS — N186 End stage renal disease: Secondary | ICD-10-CM | POA: Diagnosis not present

## 2019-12-20 DIAGNOSIS — Z992 Dependence on renal dialysis: Secondary | ICD-10-CM | POA: Diagnosis not present

## 2019-12-20 NOTE — Progress Notes (Signed)
MRN : [id number]  Stacy Pham is a 86 y.o. (1930-08-27) female who presents with chief complaint of No chief complaint on file. Stacy Pham  History of Present Illness:   The patient returns to the office for followup and review status post angiogram with intervention on 11/20/2019.   Procedure:  Percutaneous transluminal angioplasty and stent placement left peroneal and tibioperoneal trunk with a 5 x 60 life stent postdilated to 4 mm with a Lutonix drug-eluting balloon   The patient notes improvement in the right  lower extremity symptom of pain. The left great toe continues to necrose and is painful. Previous wounds have now healed.  No new ulcers or wounds have occurred since the last visit.  There have been no significant changes to the patient's overall health care.  The patient denies amaurosis fugax or recent TIA symptoms. There are no recent neurological changes noted. The patient denies history of DVT, PE or superficial thrombophlebitis. The patient denies recent episodes of angina or shortness of breath.     Current Meds  Medication Sig  . acetaminophen (TYLENOL 8 HOUR ARTHRITIS PAIN) 650 MG CR tablet Take 650 mg every 8 (eight) hours as needed by mouth for pain.  Stacy Pham allopurinol (ZYLOPRIM) 100 MG tablet TAKE 1 TABLET BY MOUTH EVERYDAY AT BEDTIME  . amiodarone (PACERONE) 200 MG tablet Take 1 tablet (200 mg total) by mouth every morning. Take 1 tablet (200 mg) by mouth once daily (Patient taking differently: Take 200 mg by mouth daily. )  . atorvastatin (LIPITOR) 20 MG tablet TAKE 1 TABLET BY MOUTH EVERY DAY IN THE EVENING (Patient taking differently: Take 20 mg by mouth at bedtime. )  . blood glucose meter kit and supplies KIT Please dispense either One Touch or Bayer Contour She must have one of these machines since she is on peritoneal dialysis. Use up to four times daily as directed. (FOR ICD-9 250.00, 250.01).  Stacy Pham clopidogrel (PLAVIX) 75 MG tablet Take 1 tablet (75 mg total) by  mouth daily.  Stacy Pham ELIQUIS 2.5 MG TABS tablet TAKE 1 TABLET BY MOUTH TWICE A DAY (Patient taking differently: Take 2.5 mg by mouth 2 (two) times daily. )  . metoprolol tartrate (LOPRESSOR) 25 MG tablet Take 0.5 tablets (12.5 mg total) by mouth 2 (two) times daily.  . traMADol (ULTRAM) 50 MG tablet Take 1 tablet (50 mg total) by mouth every 12 (twelve) hours as needed for moderate pain or severe pain.    Past Medical History:  Diagnosis Date  . (HFpEF) heart failure with preserved ejection fraction ([place])    a. 10/2017 Echo: EF 60-65%, Gr1 DD, mild MR, mildly dil LA/RA. PASP 44mHg.  . Arthritis   . Back pain   . Bronchitis   . Cataract   . Diabetes mellitus without complication (HCC)    no meds currently  . [place] ([place]   . ESRD (end stage renal disease) ([place]    a. 2018 - initially on HD but then transitioned to nightly PD in 08/2017.  .Stacy KitchenGout   . Hypertension   . PAF (paroxysmal atrial fibrillation) (HCC)    a. CHA2DS2VASc = 5-->eliquis 2.5 BID.    Past Surgical History:  Procedure Laterality Date  . A/V FISTULAGRAM Right 11/13/2019   Procedure: A/V FISTULAGRAM;  Surgeon: SKatha Cabal MD;  [facility];  Service: Cardiovascular;  Laterality: Right;  . AV FISTULA PLACEMENT Right 09/13/2018   Procedure: INSERTION OF ARTERIOVENOUS (AV) GORE-TEX GRAFT ARM;  Surgeon:  Madalee Altmann, Dolores Lory, MD;  Location: [facility];  Service: Vascular;  Laterality: Right;  . CAPD INSERTION N/A 06/08/2017   Procedure: LAPAROSCOPIC INSERTION CONTINUOUS AMBULATORY PERITONEAL DIALYSIS  (CAPD) CATHETER;  Surgeon: Katha Cabal, MD;  Location: [facility];  Service: Vascular;  Laterality: N/A;  . CATARACT EXTRACTION, BILATERAL Bilateral   . DIALYSIS/PERMA CATHETER INSERTION N/A 03/18/2017   Procedure: DIALYSIS/PERMA CATHETER INSERTION;  Surgeon: Katha Cabal, MD;  Location: [place] [facility];  Service: Cardiovascular;  Laterality: N/A;  . DIALYSIS/PERMA CATHETER REMOVAL N/A  10/20/2017   Procedure: DIALYSIS/PERMA CATHETER REMOVAL;  Surgeon: Algernon Huxley, MD;  Location: [place] [facility];  Service: Cardiovascular;  Laterality: N/A;  . EYE SURGERY    . LOWER EXTREMITY ANGIOGRAPHY Left 09/11/2019   Procedure: LOWER EXTREMITY ANGIOGRAPHY;  Surgeon: Katha Cabal, MD;  Location: [place] [facility];  Service: Cardiovascular;  Laterality: Left;  . LOWER EXTREMITY ANGIOGRAPHY Right 10/17/2019   Procedure: LOWER EXTREMITY ANGIOGRAPHY;  Surgeon: Katha Cabal, MD;  Location: [place] [facility];  Service: Cardiovascular;  Laterality: Right;  . LOWER EXTREMITY ANGIOGRAPHY Left 11/20/2019   Procedure: LOWER EXTREMITY ANGIOGRAPHY;  Surgeon: Katha Cabal, MD;  Location: [place] [facility];  Service: Cardiovascular;  Laterality: Left;  . REMOVAL OF A DIALYSIS CATHETER N/A 02/23/2019   Procedure: REMOVAL OF A DIALYSIS CATHETER ( PD CATH);  Surgeon: Katha Cabal, MD;  Location: [facility];  Service: Vascular;  Laterality: N/A;    Social History Social History   Tobacco Use  . Smoking status: Never Smoker  . Smokeless tobacco: Never Used  Vaping Use  . Vaping Use: Never used  Substance Use Topics  . Alcohol use: No  . Drug use: No    Family History Family History  Problem Relation Age of Onset  . Hypertension Father     No Known Allergies   REVIEW OF SYSTEMS (Negative unless checked)  Constitutional: _0 Weight loss  _1 Fever  _2 Chills Cardiac: _3 Chest pain   _4 Chest pressure   _5 Palpitations   _6 Shortness of breath when laying flat   _7 Shortness of breath with exertion. Vascular:  _8 Pain in legs with walking   _9 Pain in legs at rest  _10 History of DVT   _11 Phlebitis   _12 Swelling in legs   _13 Varicose veins   _14 Non-healing ulcers Pulmonary:   _15 Uses home oxygen   _16 Productive cough   _17 Hemoptysis   _18 Wheeze  _19 COPD   _20 Asthma Neurologic:  _21 Dizziness   _22 Seizures   _23 History of stroke   _24 History of TIA  _25 Aphasia   _26 Vissual changes   _27 Weakness  or numbness in arm   _28 Weakness or numbness in leg Musculoskeletal:   _29 Joint swelling   _30 Joint pain   _31 Low back pain Hematologic:  _32 Easy bruising  _33 Easy bleeding   _34 Hypercoagulable state   _35 Anemic Gastrointestinal:  _36 Diarrhea   _37 Vomiting  _38 Gastroesophageal reflux/heartburn   _39 Difficulty swallowing. Genitourinary:  _40 Chronic kidney disease   _41 Difficult urination  _42 Frequent urination   _43 Blood in urine Skin:  _44 Rashes   _45 Ulcers  Psychological:  _46 History of anxiety   _47  History of major depression.  Physical Examination  Vitals:   12/20/19 1611  BP: (!) 192/80  Pulse: 80  Weight: 156 lb (70.8 kg)  Height: _48  (1.6 m)   Body mass index is 27.63 kg/m. Gen: WD/WN, NAD Head: [place]/AT, No temporalis wasting.  Ear/Nose/Throat: Hearing grossly intact, nares w/o erythema or drainage Eyes: PER, EOMI, sclera nonicteric.  Neck: Supple, no large masses.  Pulmonary:  Good air movement, no audible wheezing bilaterally, no use of accessory muscles.  Cardiac: RRR, no JVD Vascular: gangrene of the left great toe, kissing ulcer right 4th and 5th toes improved.   Vessel Right Left  Radial Palpable Palpable  PT Not Palpable Not Palpable  DP Not Palpable Not Palpable  Gastrointestinal: Non-distended. No guarding/no peritoneal signs.  Musculoskeletal: M/S 5/5 throughout.  No deformity or atrophy.  Neurologic: CN 2-12 intact. Symmetrical.  Speech is fluent. Motor exam as listed above. Psychiatric: Judgment intact, Mood & affect appropriate for pt's clinical situation. Dermatologic: No rashes or ulcers noted.  No changes consistent with cellulitis. Lymph : No lichenification or skin changes of chronic lymphedema.  CBC Lab Results  Component Value Date   WBC 9.4 09/14/2019   HGB 11.1 (L) 09/14/2019   HCT 34.6 (L) 09/14/2019   MCV 96.9 09/14/2019   PLT 311.0 09/14/2019    BMET    Component Value Date/Time   NA 139 09/14/2019 1153   K 4.9 09/14/2019 1153   CL 98 09/14/2019 1153    CO2 29 09/14/2019 1153   GLUCOSE 114 (H) 09/14/2019 1153   BUN 30 (H) 09/14/2019 1153   CREATININE 6.97 (HH) 09/14/2019 1153   CALCIUM 9.8 09/14/2019 1153   GFRNONAA 2 (L) 06/28/2019 0941   GFRAA 2 (L) 06/28/2019 0941   CrCl cannot be calculated (Patient's most recent lab result is older than the maximum 21 days allowed.).  COAG Lab Results  Component Value Date   INR 1.5 (H) 02/16/2019   INR 1.45 08/21/2018   INR 1.43 06/16/2018    Radiology VAS [place] ABI WITH/WO TBI  Result Date: 12/13/2019 LOWER EXTREMITY DOPPLER STUDY Indications: Gangrene, and peripheral artery disease.  Vascular Interventions: 09/11/2019: Crosser Atherectomy of the Left                         Tibioperoneal Trunk and Peroneal Artery. PTA of the Left                         Tibioperoneal trunk and Peroneal Artery. PTA and Stent                         placement of the Left SFA and Popliteal Artery.                         10/17/2019 PTA of Peroneal artery. PTA and stent bilateral                         SFA and popliteal arteries. Comparison Study: 11/05/2019 Performing Technologist: Almira Coaster RVS  Examination Guidelines: A complete evaluation includes at minimum, Doppler waveform signals and systolic blood pressure reading at the level of bilateral brachial, anterior tibial, and posterior tibial arteries, when vessel segments are accessible. Bilateral testing is considered an integral part of a complete examination. Photoelectric Plethysmograph (PPG) waveforms and toe systolic pressure readings are included as required and additional duplex testing as needed. Limited examinations for reoccurring indications may be performed as noted.  ABI Findings: +---------+------------------+-----+----------+-------------------------+ Right    Rt Pressure (mmHg)IndexWaveform  Comment                   +---------+------------------+-----+----------+-------------------------+ ATA      121               0.65  monophasicRetrograde flow           +---------+------------------+-----+----------+-------------------------+ PTA      114               0.61 monophasic                          +---------+------------------+-----+----------+-------------------------+ Great Toe                                 Severe Necrotic Great Toe +---------+------------------+-----+----------+-------------------------+ +---------+------------------+-----+----------+-------------------------+ Left     Lt Pressure (mmHg)IndexWaveform  Comment                   +---------+------------------+-----+----------+-------------------------+ Brachial 187                                                        +---------+------------------+-----+----------+-------------------------+ ATA      142               0.76 monophasic                          +---------+------------------+-----+----------+-------------------------+ PTA      138               0.74 monophasic                          +---------+------------------+-----+----------+-------------------------+ Great Toe                                 Severe Necrotic Great Toe +---------+------------------+-----+----------+-------------------------+ +-------+-----------+-----------+------------+------------+ ABI/TBIToday's ABIToday's TBIPrevious ABIPrevious TBI +-------+-----------+-----------+------------+------------+ Right  .65                   .75         .25          +-------+-----------+-----------+------------+------------+ Left   .76                   .76                      +-------+-----------+-----------+------------+------------+ Right ABIs appear decreased compared to prior study on 11/05/2019. Left ABIs appear essentially unchanged compared to prior study on 11/05/2019.  Summary: Right: Resting right ankle-brachial index indicates moderate right lower extremity arterial disease. Left: Resting left ankle-brachial index indicates  moderate left lower extremity arterial disease.  *See table(s) above for measurements and observations.  Electronically signed by Hortencia Pilar MD on 12/13/2019 at 1:28:53 PM.    Final    VAS [place] LOWER EXTREMITY ARTERIAL DUPLEX  Result Date: 12/13/2019 LOWER EXTREMITY ARTERIAL DUPLEX STUDY Indications: Gangrene, peripheral artery disease, and Left great toe ulcer.  Vascular Interventions: 09/11/2019: Crosser Atherectomy of the Left                         Tibioperoneal Trunk and Peroneal Artery. PTA of the Left                         Tibioperoneal trunk and Peroneal Artery. PTA and Stent  placement of the Left SFA and Popliteal Artery. Current ABI:            Rt .65, Lt .76 Comparison Study: 11/07/2019 Performing Technologist: Almira Coaster RVS  Examination Guidelines: A complete evaluation includes B-mode imaging, spectral Doppler, color Doppler, and power Doppler as needed of all accessible portions of each vessel. Bilateral testing is considered an integral part of a complete examination. Limited examinations for reoccurring indications may be performed as noted.  +-----------+--------+-----+--------+----------+-------------+ RIGHT      PSV cm/sRatioStenosisWaveform  Comments      +-----------+--------+-----+--------+----------+-------------+ ATA Distal 16                   monophasicReversed flow +-----------+--------+-----+--------+----------+-------------+ PTA Distal 67                   monophasic              +-----------+--------+-----+--------+----------+-------------+ PERO Distal54                   monophasic              +-----------+--------+-----+--------+----------+-------------+  +-----------+--------+-----+--------+----------+--------+ LEFT       PSV cm/sRatioStenosisWaveform  Comments +-----------+--------+-----+--------+----------+--------+ CFA Distal 118                  biphasic            +-----------+--------+-----+--------+----------+--------+ DFA        81                   biphasic           +-----------+--------+-----+--------+----------+--------+ SFA Prox   137                  biphasic           +-----------+--------+-----+--------+----------+--------+ SFA Mid    134                  biphasic           +-----------+--------+-----+--------+----------+--------+ SFA Distal 107                  biphasic           +-----------+--------+-----+--------+----------+--------+ POP Distal 111                  monophasic         +-----------+--------+-----+--------+----------+--------+ ATA Distal 45                   monophasic         +-----------+--------+-----+--------+----------+--------+ PTA Distal 82                   monophasic         +-----------+--------+-----+--------+----------+--------+ PERO Distal120                  monophasic         +-----------+--------+-----+--------+----------+--------+  Summary: Right: Imaged and Obtained Waveforms of the Right PTA, ATA and Peroneal Artery at the level of the Ankle; Monophasic Flow seen in all vessels. Retrograde flow seen in the Right ATA. Left: Imaging and Waveforms obtained throughout in the Left Lower Extremity. Biphasic Waveforms seen in the Femoral Artery and Monophasic flow seen below the knee.  See table(s) above for measurements and observations. Electronically signed by Hortencia Pilar MD on 12/13/2019 at 1:28:56 PM.    Final      Assessment/Plan 1. Atherosclerosis of native arteries of the extremities with ulceration ([place]) I  have discussed amputation of her left great toe secondary to the gangrene.  She has consider this and agrees to proceed.  The risks and benefits of been reviewed all questions been answered all are in agreement.  2. Essential hypertension Continue antihypertensive medications as already ordered, these medications have been reviewed and there are no changes at  this time.   3. Paroxysmal atrial fibrillation (HCC) Continue antiarrhythmia medications as already ordered, these medications have been reviewed and there are no changes at this time.  Continue anticoagulation as ordered by Cardiology Service   4. Type 2 diabetes mellitus with vascular disease ([place]) Continue hypoglycemic medications as already ordered, these medications have been reviewed and there are no changes at this time.  Hgb A1C to be monitored as already arranged by primary service   5. Complication from renal dialysis device, sequela Recommend:  The patient is doing well and currently has adequate dialysis access. The patient's dialysis center is not reporting any access issues. Flow pattern is stable when compared to the prior ultrasound.  The patient should have a duplex ultrasound of the dialysis access in 6 months. The patient will follow-up with me in the office after each ultrasound     6. ESRD (end stage renal disease) ([place]) At the present time the patient has adequate dialysis access.  Continue hemodialysis as ordered without interruption.  Avoid nephrotoxic medications and dehydration.  Further plans per nephrology  Hortencia Pilar, MD  12/20/2019 4:27 PM

## 2019-12-21 ENCOUNTER — Other Ambulatory Visit (INDEPENDENT_AMBULATORY_CARE_PROVIDER_SITE_OTHER): Payer: Self-pay | Admitting: Vascular Surgery

## 2019-12-21 NOTE — Telephone Encounter (Signed)
He never sent this over ( schnier ) and he wanted the patient to have it he saw her yesterday I cant do it due to it being a control can you please refill this .

## 2019-12-22 ENCOUNTER — Encounter (INDEPENDENT_AMBULATORY_CARE_PROVIDER_SITE_OTHER): Payer: Self-pay | Admitting: Vascular Surgery

## 2019-12-22 DIAGNOSIS — D509 Iron deficiency anemia, unspecified: Secondary | ICD-10-CM | POA: Diagnosis not present

## 2019-12-22 DIAGNOSIS — D631 Anemia in chronic kidney disease: Secondary | ICD-10-CM | POA: Diagnosis not present

## 2019-12-22 DIAGNOSIS — Z992 Dependence on renal dialysis: Secondary | ICD-10-CM | POA: Diagnosis not present

## 2019-12-22 DIAGNOSIS — N2581 Secondary hyperparathyroidism of renal origin: Secondary | ICD-10-CM | POA: Diagnosis not present

## 2019-12-22 DIAGNOSIS — N186 End stage renal disease: Secondary | ICD-10-CM | POA: Diagnosis not present

## 2019-12-25 ENCOUNTER — Encounter (INDEPENDENT_AMBULATORY_CARE_PROVIDER_SITE_OTHER): Payer: Self-pay

## 2019-12-25 ENCOUNTER — Telehealth (INDEPENDENT_AMBULATORY_CARE_PROVIDER_SITE_OTHER): Payer: Self-pay

## 2019-12-25 DIAGNOSIS — D509 Iron deficiency anemia, unspecified: Secondary | ICD-10-CM | POA: Diagnosis not present

## 2019-12-25 DIAGNOSIS — D631 Anemia in chronic kidney disease: Secondary | ICD-10-CM | POA: Diagnosis not present

## 2019-12-25 DIAGNOSIS — N186 End stage renal disease: Secondary | ICD-10-CM | POA: Diagnosis not present

## 2019-12-25 DIAGNOSIS — N2581 Secondary hyperparathyroidism of renal origin: Secondary | ICD-10-CM | POA: Diagnosis not present

## 2019-12-25 DIAGNOSIS — Z992 Dependence on renal dialysis: Secondary | ICD-10-CM | POA: Diagnosis not present

## 2019-12-25 NOTE — Telephone Encounter (Signed)
Spoke with the patient's daughter and the patient is scheduled with Dr. Delana Meyer for a left great toe amputation on 01/09/20 at the [facility]. Patient will do a phone pre-op on 12/31/19 between 1-5 pm and covid testing on 06-28/21 between 8-1 pm at the [place]. Pre-surgical instructions were discussed with the daughter and will be mailed.

## 2019-12-29 DIAGNOSIS — N186 End stage renal disease: Secondary | ICD-10-CM | POA: Diagnosis not present

## 2019-12-29 DIAGNOSIS — Z992 Dependence on renal dialysis: Secondary | ICD-10-CM | POA: Diagnosis not present

## 2019-12-29 DIAGNOSIS — D631 Anemia in chronic kidney disease: Secondary | ICD-10-CM | POA: Diagnosis not present

## 2019-12-29 DIAGNOSIS — D509 Iron deficiency anemia, unspecified: Secondary | ICD-10-CM | POA: Diagnosis not present

## 2019-12-29 DIAGNOSIS — N2581 Secondary hyperparathyroidism of renal origin: Secondary | ICD-10-CM | POA: Diagnosis not present

## 2019-12-31 ENCOUNTER — Other Ambulatory Visit: Payer: Self-pay

## 2019-12-31 ENCOUNTER — Encounter
Admission: RE | Admit: 2019-12-31 | Discharge: 2019-12-31 | Disposition: A | Discharge status: Home / Self Care | Payer: Medicare Other | Source: Ambulatory Visit | Attending: Vascular Surgery | Admitting: Vascular Surgery

## 2019-12-31 ENCOUNTER — Other Ambulatory Visit (INDEPENDENT_AMBULATORY_CARE_PROVIDER_SITE_OTHER): Payer: Self-pay | Admitting: Nurse Practitioner

## 2019-12-31 ENCOUNTER — Other Ambulatory Visit: Payer: Self-pay | Admitting: Family Medicine

## 2019-12-31 DIAGNOSIS — N186 End stage renal disease: Secondary | ICD-10-CM

## 2019-12-31 DIAGNOSIS — Z01818 Encounter for other preprocedural examination: Secondary | ICD-10-CM | POA: Insufficient documentation

## 2019-12-31 HISTORY — DX: Personal history of Methicillin resistant Staphylococcus aureus infection: Z86.14

## 2019-12-31 NOTE — Patient Instructions (Addendum)
Your procedure is scheduled on: 01-09-20 [facility] Report to Same Day Surgery 2nd floor medical mall [facility] Entrance-take elevator on left to 2nd floor.  Check in with surgery information desk.) To find out your arrival time please call [phone] between 1PM - 3PM on 01-08-20 TUESDAY  Remember: Instructions that are not followed completely may result in serious medical risk, up to and including death, or upon the discretion of your surgeon and anesthesiologist your surgery may need to be rescheduled.    _x___ 1. Do not eat food after midnight the night before your procedure. NO GUM OR CANDY AFTER MIDNIGHT. You may drink WATER up to 2 hours before you are scheduled to arrive at the hospital for your procedure.  Do not drink WATER within 2 hours of your scheduled arrival to the hospital.  Type 1 and type 2 diabetics should only drink water.    __x__ 2. No Alcohol for 24 hours before or after surgery.   __x__3. No Smoking or e-cigarettes for 24 prior to surgery.  Do not use any chewable tobacco products for at least 6 hour prior to surgery   ____  4. Bring all medications with you on the day of surgery if instructed.    __x__ 5. Notify your doctor if there is any change in your medical condition     (cold, fever, infections).    x___6. On the morning of surgery brush your teeth with toothpaste and water.  You may rinse your mouth with mouth wash if you wish.  Do not swallow any toothpaste or mouthwash.   Do not wear jewelry, make-up, hairpins, clips or nail polish.  Do not wear lotions, powders, or perfumes.  Do not shave 48 hours prior to surgery. Men may shave face and neck.  Do not bring valuables to the hospital.    [facility] is not responsible for any belongings or valuables.               Contacts, dentures or bridgework may not be worn into surgery.  Leave your suitcase in the car. After surgery it may be brought to your room.  For patients admitted to the hospital,  discharge time is determined by your  treatment team.  _  Patients discharged the day of surgery will not be allowed to drive home.  You will need someone to drive you home and stay with you the night of your procedure.    Please read over the following fact sheets that you were given:   [facility] Preparing for Surgery   _x___ TAKE THE FOLLOWING MEDICATION THE MORNING OF SURGERY WITH A SMALL SIP OF WATER. These include:  1. NORVASC (AMLODIPINE)  2. AMIODARONE (PACERONE)  3. METOPROLOL (TOPROL)  4. YOU MAY TAKE TRAMADOL DAY OF SURGERY IF NEEDED  5.  6.  ____Fleets enema or Magnesium Citrate as directed.   _x___ Use CHG WIPES as directed on instruction sheet   ____ Use inhalers on the day of surgery and bring to hospital day of surgery  ____ Stop Metformin and Janumet 2 days prior to surgery.    ____ Take 1/2 of usual insulin dose the night before surgery and none on the morning surgery.   _x___ Follow recommendations from Cardiologist, Pulmonologist or PCP regarding  stopping Aspirin, Coumadin, Plavix ,Eliquis, Effient, or Pradaxa, and Pletal-CONTINUE ELIQUIS AND PLAVIX (CLOPIDOGREL)-DO NOT TAKE THE MORNING OF SURGERY  X____Stop Anti-inflammatories such as Advil, Aleve, Ibuprofen, Motrin, Naproxen, Naprosyn, Goodies powders or aspirin products  NOW-OK to take Tylenol OR TRAMADOL IF NEEDED   ____ Stop supplements until after surgery.     ____ Bring C-Pap to the hospital.

## 2019-12-31 NOTE — Pre-Procedure Instructions (Signed)
Stacy Mu, PA-C  Physician Assistant Certified  Cardiology  Progress Notes    Signed  Encounter Date:  01/22/2019          Signed      Expand All Collapse All  Show:Clear all '[x]' Manual'[x]' Template'[]' Copied  Added by: '[x]' Stacy Mu, PA-C  '[]' Hover for details    Cardiology Office Note Date:  01/22/2019  Patient ID:  Stacy Pham, Stacy Pham 06-18-1931, MRN [id number] PCP:  Elby Beck, FNP        Cardiologist:  Dr. Saunders Revel, MD    Chief Complaint: Follow up Afib  History of Present Illness: Stacy Pham is a 86 y.o. female with history of PAF on Eliquis, ESRD on HD (TTS), HFpEF, anemia of chronic disease, DM2, HTN, and gout who presents for follow up of her Afib.   Patient has been managed on Eliquis since diagnosis of A. fib in 2018.  She was previously on hemodialysis though transitioned to PD in 08/2017.  Echo from 10/2017 showed an EF of 60 to 56%, grade 1 diastolic dysfunction, mild MR, PASP 40 mmHg.  More recently, it was recommended she transition back to hemodialysis by her nephrologist.  She was scheduled to undergo AV fistula placement in 07/2018 though upon presentation she was noted to be in A. fib with RVR and hypertensive.  Case was canceled and she was seen in the PACU.  Her carvedilol was transitioned to metoprolol.  Of note, the patient was asymptomatic in this setting.  She subsequently underwent AV fistula placement on 09/13/2018.  Patient was recently admitted to [facility] in early 01/2019 for gout flare of the foot. Initial potassium was elevated at 7.3. CXR showed pulmonary vascular congestion with suspected degree of volume overload. She was treated by IM and underwent HD.  It appears that she maintains sinus rhythm during the admission.  She comes in doing well from a cardiac perspective.  No chest pain, shortness of breath, palpitations, dizziness, recently, or syncope.  No falls, BRBPR, or melena.  No lower extremity swelling, abdominal distention, orthopnea,  PND, early satiety.  Her main concerns today are noncardiac and related to 1) while she was prescribed prednisone time of discharge for her gout and 2) chronic low back pain for which she takes Tylenol.  She also states her blood pressure seems to be trending on the high side during hemodialysis.  Labs:  01/2019 - Potassium 4.9, uric acid 8.1, AST/ALT normal, albumin 3.8, HGB 8.9 08/2018 - A1c 6.7 04/2018 - LDL 111 02/2018 - TSH normal       Past Medical History:  Diagnosis Date  . (HFpEF) heart failure with preserved ejection fraction ([place])    a. 10/2017 Echo: EF 60-65%, Gr1 DD, mild MR, mildly dil LA/RA. PASP 3mHg.  . Arthritis   . Back pain   . Bronchitis   . Cataract   . Diabetes mellitus without complication ([place]   . Dialysis patient ([facility]   . ESRD (end stage renal disease) ([facility]    a. 2018 - initially on HD but then transitioned to nightly PD in 08/2017.  .Marland KitchenGout   . Hypertension   . PAF (paroxysmal atrial fibrillation) (HCC)    a. CHA2DS2VASc = 5-->eliquis 2.5 BID.         Past Surgical History:  Procedure Laterality Date  . AV FISTULA PLACEMENT Right 09/13/2018   Procedure: INSERTION OF ARTERIOVENOUS (AV) GORE-TEX GRAFT ARM;  Surgeon: SKatha Cabal MD;  Location: [facility] ORS;  Service: Vascular;  Laterality:  Right;  [name] CAPD INSERTION N/A 06/08/2017   Procedure: LAPAROSCOPIC INSERTION CONTINUOUS AMBULATORY PERITONEAL DIALYSIS  (CAPD) CATHETER;  Surgeon: Katha Cabal, MD;  Location: [facility] ORS;  Service: Vascular;  Laterality: N/A;  . CATARACT EXTRACTION, BILATERAL Bilateral   . DIALYSIS/PERMA CATHETER INSERTION N/A 03/18/2017   Procedure: DIALYSIS/PERMA CATHETER INSERTION;  Surgeon: Katha Cabal, MD;  Location: [place] [facility];  Service: Cardiovascular;  Laterality: N/A;  . DIALYSIS/PERMA CATHETER REMOVAL N/A 10/20/2017   Procedure: DIALYSIS/PERMA CATHETER REMOVAL;  Surgeon: Algernon Huxley, MD;  Location: [place] [facility];  Service:  Cardiovascular;  Laterality: N/A;  . EYE SURGERY      Active Medications  Current Meds  Medication Sig  . acetaminophen (TYLENOL 8 HOUR ARTHRITIS PAIN) 650 MG CR tablet Take 650 mg every 8 (eight) hours as needed by mouth for pain.  [name] allopurinol (ZYLOPRIM) 100 MG tablet Take 1 tablet (100 mg total) by mouth at bedtime.  [name] amiodarone (PACERONE) 200 MG tablet Take 1 tablet (200 mg total) by mouth 2 (two) times daily. Take 1 tablet (200 mg) by mouth two times a day for 2 weeks, then decrease to 1 tablet (200 mg) by mouth once a day.  [name] apixaban (ELIQUIS) 2.5 MG TABS tablet Take 1 tablet (2.5 mg total) by mouth 2 (two) times daily.  [name] atorvastatin (LIPITOR) 20 MG tablet Take 1 tablet (20 mg total) by mouth every evening.  . blood glucose meter kit and supplies KIT Please dispense either One Touch or Bayer Contour She must have one of these machines since she is on peritoneal dialysis. Use up to four times daily as directed. (FOR ICD-9 250.00, 250.01).  . calcitRIOL (ROCALTROL) 0.25 MCG capsule Take 0.25 mcg by mouth daily.  . cinacalcet (SENSIPAR) 30 MG tablet Take 1 tablet (30 mg total) by mouth daily with supper. (Patient taking differently: Take 30 mg by mouth daily with supper. With largest meal)  . colchicine 0.6 MG tablet Take 1 tablet (0.6 mg total) by mouth daily as needed (for gout flare ups).  . ferric citrate (AURYXIA) 1 GM 210 MG(Fe) tablet Take 210 mg by mouth 3 (three) times daily with meals.  . gabapentin (NEURONTIN) 100 MG capsule Take 2 capsules (200 mg total) by mouth 3 (three) times daily.  . metoprolol tartrate (LOPRESSOR) 25 MG tablet Take 1 tablet (25 mg total) by mouth daily.      Allergies:   Patient has no known allergies.   Social History:  The patient  reports that she has never smoked. She has never used smokeless tobacco. She reports that she does not drink alcohol or use drugs.   Family History:  The patient's family history includes Hypertension in her  father.  ROS:   Review of Systems  Constitutional: Negative for chills, diaphoresis, fever, malaise/fatigue and weight loss.  HENT: Negative for congestion.   Eyes: Negative for discharge and redness.  Respiratory: Negative for cough, hemoptysis, sputum production, shortness of breath and wheezing.   Cardiovascular: Negative for chest pain, palpitations, orthopnea, claudication, leg swelling and PND.  Gastrointestinal: Negative for abdominal pain, blood in stool, heartburn, melena, nausea and vomiting.  Genitourinary: Negative for hematuria.  Musculoskeletal: Positive for back pain and joint pain. Negative for falls and myalgias.  Skin: Negative for rash.  Neurological: Negative for dizziness, tingling, tremors, sensory change, speech change, focal weakness, loss of consciousness and weakness.  Endo/Heme/Allergies: Does not bruise/bleed easily.  Psychiatric/Behavioral: Negative for substance abuse. The patient is not  nervous/anxious.   All other systems reviewed and are negative.    PHYSICAL EXAM:  VS:  BP (!) 150/70 (BP Location: Left Arm, Patient Position: Sitting, Cuff Size: Normal)   Pulse (!) 59   Temp (!) 97 F (36.1 C)   Ht '5\' 3"'  (1.6 m)   Wt 190 lb 8 oz (86.4 kg)   SpO2 97%   BMI 33.75 kg/m  BMI: Body mass index is 33.75 kg/m.  Physical Exam  Constitutional: She is oriented to person, place, and time. She appears well-developed and well-nourished.  HENT:  Head: Normocephalic and atraumatic.  Eyes: Right eye exhibits no discharge. Left eye exhibits no discharge.  Neck: Normal range of motion. No JVD present.  Cardiovascular: Regular rhythm, S1 normal, S2 normal and normal heart sounds. Bradycardia present. Exam reveals no distant heart sounds, no friction rub, no midsystolic click and no opening snap.  No murmur heard. Pulmonary/Chest: Effort normal and breath sounds normal. No respiratory distress. She has no decreased breath sounds. She has no wheezes. She has no  rales. She exhibits no tenderness.  Abdominal: Soft. She exhibits no distension. There is no abdominal tenderness.  Musculoskeletal:        General: No edema.  Neurological: She is alert and oriented to person, place, and time.  Skin: Skin is warm and dry. No cyanosis. Nails show no clubbing.  Psychiatric: She has a normal mood and affect. Her speech is normal and behavior is normal. Judgment and thought content normal.     EKG:  Was ordered and interpreted by me today. Shows sinus bradycardia, 59 bpm, no acute ST-T changes  Recent Labs: 02/21/2018: TSH 3.308 03/17/2018: Magnesium 1.6 01/18/2019: ALT 18; BUN 70; Creatinine, Ser 18.30; Hemoglobin 8.9; Platelets 360; Sodium 137 01/19/2019: Potassium 4.9  05/03/2018: Chol/HDL Ratio 3.9; Cholesterol, Total 179; HDL 46; LDL Calculated 111; Triglycerides 108   Estimated Creatinine Clearance: 2.3 mL/min (A) (by C-G formula based on SCr of 18.3 mg/dL (H)).      Wt Readings from Last 3 Encounters:  01/22/19 190 lb 8 oz (86.4 kg)  01/18/19 187 lb 4.8 oz (85 kg)  12/22/18 193 lb (87.5 kg)     Other studies reviewed: Additional studies/records reviewed today include: summarized above  ASSESSMENT AND PLAN:  1. PAF: Maintaining sinus rhythm with a mildly bradycardic heart rate.  She has been taking Lopressor 25 mg once daily, presumably in the setting of issues with relative hypotension surrounding her dialysis.  We will change her to Lopressor 12.5 mg twice daily for more consistent dosing/BP control.  Continue amiodarone 200 mg daily.  Recent LFT and TSH normal.  Schedule PFTs given recently started amiodarone.  She will need routine follow up of her LFT, thyroid function, as well as regular eye exams.  CHADS2VASc at least 6 (HTN, age x 2, DM, vascular disease, female).  Continue Eliquis 2.5 mg twice daily as she meets reduced dosing criteria with age and renal function.  2. HFpEF: She appears euvolemic and well compensated.  Volume  managed by hemodialysis.  3. ESRD: HD per nephrology.   4. Hypertension: Blood pressure is mildly elevated today at 150/70, though she has issues with relative hypotension during HD.  However, she does state her blood pressure has been trending up with hemodialysis which would certainly be a new finding for her.  In this setting, we will call her dialysis center to request recent BP readings with follow-up recommendation of antihypertensives after we have this information.  For now,  we will change her Lopressor from 25 mg daily to 12.5 mg twice daily as outlined above.    5. Anemia of chronic disease: Most recent hemoglobin stable.  Follow-up with PCP as directed later this week.  6. Gout: Follow-up with PCP.  7. Chronic low back pain: Follow-up with PCP.  Disposition: F/u with Dr. Saunders Revel or an APP in 6 months.  Current medicines are reviewed at length with the patient today.  The patient did not have any concerns regarding medicines.  Signed, Christell Faith, PA-C 01/22/2019 8:42 AM     [facility] [place] [address] [place] [address] [place], [place] [address] [phone]        Electronically signed by Stacy Mu, PA-C at 01/22/2019 9:41 AM  Office Visit on 01/22/2019   Office Visit on 01/22/2019     Detailed Report    Note shared with patient

## 2020-01-01 DIAGNOSIS — D509 Iron deficiency anemia, unspecified: Secondary | ICD-10-CM | POA: Diagnosis not present

## 2020-01-01 DIAGNOSIS — N2581 Secondary hyperparathyroidism of renal origin: Secondary | ICD-10-CM | POA: Diagnosis not present

## 2020-01-01 DIAGNOSIS — N186 End stage renal disease: Secondary | ICD-10-CM | POA: Diagnosis not present

## 2020-01-01 DIAGNOSIS — D631 Anemia in chronic kidney disease: Secondary | ICD-10-CM | POA: Diagnosis not present

## 2020-01-01 DIAGNOSIS — Z992 Dependence on renal dialysis: Secondary | ICD-10-CM | POA: Diagnosis not present

## 2020-01-02 ENCOUNTER — Encounter
Admission: RE | Admit: 2020-01-02 | Discharge: 2020-01-02 | Disposition: A | Discharge status: Home / Self Care | Payer: Medicare Other | Source: Ambulatory Visit | Attending: Vascular Surgery | Admitting: Vascular Surgery

## 2020-01-02 ENCOUNTER — Other Ambulatory Visit: Payer: Self-pay

## 2020-01-02 DIAGNOSIS — Z01818 Encounter for other preprocedural examination: Secondary | ICD-10-CM | POA: Diagnosis not present

## 2020-01-02 DIAGNOSIS — Z01812 Encounter for preprocedural laboratory examination: Secondary | ICD-10-CM | POA: Insufficient documentation

## 2020-01-02 LAB — BASIC METABOLIC PANEL
Anion gap: 16 — ABNORMAL HIGH (ref 5–15)
BUN: 24 mg/dL — ABNORMAL HIGH (ref 8–23)
CO2: 24 mmol/L (ref 22–32)
Calcium: 9.1 mg/dL (ref 8.9–10.3)
Chloride: 95 mmol/L — ABNORMAL LOW (ref 98–111)
Creatinine, Ser: 7 mg/dL — ABNORMAL HIGH (ref 0.44–1.00)
GFR calc Af Amer: 6 mL/min — ABNORMAL LOW (ref >60)
GFR calc non Af Amer: 5 mL/min — ABNORMAL LOW (ref >60)
Glucose, Bld: 95 mg/dL (ref 70–99)
Potassium: 4.2 mmol/L (ref 3.5–5.1)
Sodium: 135 mmol/L (ref 135–145)

## 2020-01-02 LAB — CBC WITH DIFFERENTIAL/PLATELET
Abs Immature Granulocytes: 0.02 10*3/uL (ref 0.00–0.07)
Basophils Absolute: 0.1 10*3/uL (ref 0.0–0.1)
Basophils Relative: 1 %
Eosinophils Absolute: 0 10*3/uL (ref 0.0–0.5)
Eosinophils Relative: 1 %
HCT: 39.9 % (ref 36.0–46.0)
Hemoglobin: 12.4 g/dL (ref 12.0–15.0)
Immature Granulocytes: 0 %
Lymphocytes Relative: 20 %
Lymphs Abs: 1.1 10*3/uL (ref 0.7–4.0)
MCH: 30.5 pg (ref 26.0–34.0)
MCHC: 31.1 g/dL (ref 30.0–36.0)
MCV: 98.3 fL (ref 80.0–100.0)
Monocytes Absolute: 0.6 10*3/uL (ref 0.1–1.0)
Monocytes Relative: 11 %
Neutro Abs: 3.9 10*3/uL (ref 1.7–7.7)
Neutrophils Relative %: 67 %
Platelets: 343 10*3/uL (ref 150–400)
RBC: 4.06 MIL/uL (ref 3.87–5.11)
RDW: 14.8 % (ref 11.5–15.5)
WBC: 5.7 10*3/uL (ref 4.0–10.5)
nRBC: 0 % (ref 0.0–0.2)

## 2020-01-02 LAB — PROTIME-INR
INR: 1.3 — ABNORMAL HIGH (ref 0.8–1.2)
Prothrombin Time: 15.9 seconds — ABNORMAL HIGH (ref 11.4–15.2)

## 2020-01-02 LAB — TYPE AND SCREEN
ABO/RH(D): B POS
Antibody Screen: NEGATIVE

## 2020-01-02 LAB — APTT: aPTT: 39 seconds — ABNORMAL HIGH (ref 24–36)

## 2020-01-02 NOTE — Progress Notes (Signed)
  [facility] Perioperative Services: Pre-Admission/Anesthesia Testing  Abnormal Lab Notification  Date: 01/02/20  Name: Naturi Alarid MRN:   [id number]  Re: Abnormal labs noted during PAT appointment  Provider Notified: Eulogio Ditch, NP Notification mode: Routed via [facility] of concern:  BUN 24 and creatinine 7.00. Estimated Creatinine Clearance: 5.3 mL/min (A) (by C-G formula based on SCr of 7 mg/dL (H)).   PT 15.9 and INR 1.3  PTT 39  Honor Loh, MSN, APRN, FNP-C, CEN [facility]  Peri-operative Services Nurse Practitioner Phone: [phone] 01/02/20 12:51 PM

## 2020-01-03 DIAGNOSIS — Z992 Dependence on renal dialysis: Secondary | ICD-10-CM | POA: Diagnosis not present

## 2020-01-03 DIAGNOSIS — D631 Anemia in chronic kidney disease: Secondary | ICD-10-CM | POA: Diagnosis not present

## 2020-01-03 DIAGNOSIS — N186 End stage renal disease: Secondary | ICD-10-CM | POA: Diagnosis not present

## 2020-01-03 DIAGNOSIS — D509 Iron deficiency anemia, unspecified: Secondary | ICD-10-CM | POA: Diagnosis not present

## 2020-01-03 DIAGNOSIS — N2581 Secondary hyperparathyroidism of renal origin: Secondary | ICD-10-CM | POA: Diagnosis not present

## 2020-01-05 DIAGNOSIS — D509 Iron deficiency anemia, unspecified: Secondary | ICD-10-CM | POA: Diagnosis not present

## 2020-01-05 DIAGNOSIS — D631 Anemia in chronic kidney disease: Secondary | ICD-10-CM | POA: Diagnosis not present

## 2020-01-05 DIAGNOSIS — N2581 Secondary hyperparathyroidism of renal origin: Secondary | ICD-10-CM | POA: Diagnosis not present

## 2020-01-05 DIAGNOSIS — N186 End stage renal disease: Secondary | ICD-10-CM | POA: Diagnosis not present

## 2020-01-05 DIAGNOSIS — Z992 Dependence on renal dialysis: Secondary | ICD-10-CM | POA: Diagnosis not present

## 2020-01-07 ENCOUNTER — Other Ambulatory Visit: Payer: Self-pay

## 2020-01-07 ENCOUNTER — Other Ambulatory Visit
Admission: RE | Admit: 2020-01-07 | Discharge: 2020-01-07 | Disposition: A | Discharge status: Home / Self Care | Payer: Medicare Other | Source: Ambulatory Visit | Attending: Vascular Surgery | Admitting: Vascular Surgery

## 2020-01-07 DIAGNOSIS — Z20822 Contact with and (suspected) exposure to covid-19: Secondary | ICD-10-CM | POA: Diagnosis not present

## 2020-01-07 DIAGNOSIS — Z01812 Encounter for preprocedural laboratory examination: Secondary | ICD-10-CM | POA: Diagnosis present

## 2020-01-07 LAB — SARS CORONAVIRUS 2 (TAT 6-24 HRS): SARS Coronavirus 2: NEGATIVE

## 2020-01-08 DIAGNOSIS — N186 End stage renal disease: Secondary | ICD-10-CM | POA: Diagnosis not present

## 2020-01-08 DIAGNOSIS — D509 Iron deficiency anemia, unspecified: Secondary | ICD-10-CM | POA: Diagnosis not present

## 2020-01-08 DIAGNOSIS — Z992 Dependence on renal dialysis: Secondary | ICD-10-CM | POA: Diagnosis not present

## 2020-01-08 DIAGNOSIS — D631 Anemia in chronic kidney disease: Secondary | ICD-10-CM | POA: Diagnosis not present

## 2020-01-08 DIAGNOSIS — N2581 Secondary hyperparathyroidism of renal origin: Secondary | ICD-10-CM | POA: Diagnosis not present

## 2020-01-09 ENCOUNTER — Encounter
Admission: RE | Disposition: A | Discharge status: Home / Self Care | Payer: Self-pay | Source: Home / Self Care | Attending: Vascular Surgery

## 2020-01-09 ENCOUNTER — Encounter: Payer: Self-pay | Admitting: Vascular Surgery

## 2020-01-09 ENCOUNTER — Ambulatory Visit: Payer: Medicare Other | Admitting: Certified Registered"

## 2020-01-09 ENCOUNTER — Ambulatory Visit
Admission: RE | Admit: 2020-01-09 | Discharge: 2020-01-09 | Disposition: A | Discharge status: Home / Self Care | Payer: Medicare Other | Attending: Vascular Surgery | Admitting: Vascular Surgery

## 2020-01-09 ENCOUNTER — Other Ambulatory Visit: Payer: Self-pay

## 2020-01-09 DIAGNOSIS — I70269 Atherosclerosis of native arteries of extremities with gangrene, unspecified extremity: Secondary | ICD-10-CM | POA: Diagnosis not present

## 2020-01-09 DIAGNOSIS — I5032 Chronic diastolic (congestive) heart failure: Secondary | ICD-10-CM | POA: Insufficient documentation

## 2020-01-09 DIAGNOSIS — N186 End stage renal disease: Secondary | ICD-10-CM | POA: Insufficient documentation

## 2020-01-09 DIAGNOSIS — L97529 Non-pressure chronic ulcer of other part of left foot with unspecified severity: Secondary | ICD-10-CM | POA: Diagnosis not present

## 2020-01-09 DIAGNOSIS — E1152 Type 2 diabetes mellitus with diabetic peripheral angiopathy with gangrene: Secondary | ICD-10-CM | POA: Diagnosis not present

## 2020-01-09 DIAGNOSIS — Z79899 Other long term (current) drug therapy: Secondary | ICD-10-CM | POA: Insufficient documentation

## 2020-01-09 DIAGNOSIS — M1990 Unspecified osteoarthritis, unspecified site: Secondary | ICD-10-CM | POA: Insufficient documentation

## 2020-01-09 DIAGNOSIS — M109 Gout, unspecified: Secondary | ICD-10-CM | POA: Diagnosis not present

## 2020-01-09 DIAGNOSIS — Z7901 Long term (current) use of anticoagulants: Secondary | ICD-10-CM | POA: Insufficient documentation

## 2020-01-09 DIAGNOSIS — I96 Gangrene, not elsewhere classified: Secondary | ICD-10-CM | POA: Diagnosis not present

## 2020-01-09 DIAGNOSIS — I480 Paroxysmal atrial fibrillation: Secondary | ICD-10-CM | POA: Diagnosis not present

## 2020-01-09 DIAGNOSIS — Z95820 Peripheral vascular angioplasty status with implants and grafts: Secondary | ICD-10-CM | POA: Diagnosis not present

## 2020-01-09 DIAGNOSIS — Z992 Dependence on renal dialysis: Secondary | ICD-10-CM | POA: Diagnosis not present

## 2020-01-09 DIAGNOSIS — I132 Hypertensive heart and chronic kidney disease with heart failure and with stage 5 chronic kidney disease, or end stage renal disease: Secondary | ICD-10-CM | POA: Insufficient documentation

## 2020-01-09 DIAGNOSIS — E1136 Type 2 diabetes mellitus with diabetic cataract: Secondary | ICD-10-CM | POA: Diagnosis not present

## 2020-01-09 DIAGNOSIS — Z7902 Long term (current) use of antithrombotics/antiplatelets: Secondary | ICD-10-CM | POA: Diagnosis not present

## 2020-01-09 DIAGNOSIS — E1122 Type 2 diabetes mellitus with diabetic chronic kidney disease: Secondary | ICD-10-CM | POA: Diagnosis not present

## 2020-01-09 DIAGNOSIS — I70262 Atherosclerosis of native arteries of extremities with gangrene, left leg: Secondary | ICD-10-CM | POA: Diagnosis not present

## 2020-01-09 DIAGNOSIS — I509 Heart failure, unspecified: Secondary | ICD-10-CM | POA: Diagnosis not present

## 2020-01-09 HISTORY — PX: AMPUTATION: SHX166

## 2020-01-09 LAB — GLUCOSE, CAPILLARY
Glucose-Capillary: 100 mg/dL — ABNORMAL HIGH (ref 70–99)
Glucose-Capillary: 85 mg/dL (ref 70–99)

## 2020-01-09 LAB — POCT I-STAT, CHEM 8
BUN: 28 mg/dL — ABNORMAL HIGH (ref 8–23)
Calcium, Ion: 1.03 mmol/L — ABNORMAL LOW (ref 1.15–1.40)
Chloride: 97 mmol/L — ABNORMAL LOW (ref 98–111)
Creatinine, Ser: 7.2 mg/dL — ABNORMAL HIGH (ref 0.44–1.00)
Glucose, Bld: 111 mg/dL — ABNORMAL HIGH (ref 70–99)
HCT: 42 % (ref 36.0–46.0)
Hemoglobin: 14.3 g/dL (ref 12.0–15.0)
Potassium: 3.8 mmol/L (ref 3.5–5.1)
Sodium: 140 mmol/L (ref 135–145)
TCO2: 30 mmol/L (ref 22–32)

## 2020-01-09 SURGERY — AMPUTATION, FOOT, RAY
Anesthesia: General | Laterality: Left

## 2020-01-09 MED ORDER — CHLORHEXIDINE GLUCONATE 0.12 % MT SOLN
OROMUCOSAL | Status: AC
  Administered 2020-01-09: 15 mL via OROMUCOSAL
  Filled 2020-01-09: qty 15

## 2020-01-09 MED ORDER — FENTANYL CITRATE (PF) 100 MCG/2ML IJ SOLN
INTRAMUSCULAR | Status: DC | PRN
  Administered 2020-01-09 (×2): 50 ug via INTRAVENOUS

## 2020-01-09 MED ORDER — ONDANSETRON HCL 4 MG/2ML IJ SOLN
INTRAMUSCULAR | Status: DC | PRN
  Administered 2020-01-09: 4 mg via INTRAVENOUS

## 2020-01-09 MED ORDER — DEXAMETHASONE SODIUM PHOSPHATE 10 MG/ML IJ SOLN
INTRAMUSCULAR | Status: AC
  Filled 2020-01-09: qty 1

## 2020-01-09 MED ORDER — LIDOCAINE HCL (PF) 2 % IJ SOLN
INTRAMUSCULAR | Status: AC
  Filled 2020-01-09: qty 5

## 2020-01-09 MED ORDER — FAMOTIDINE 20 MG PO TABS
20.0000 mg | ORAL_TABLET | Freq: Once | ORAL | Status: AC
  Administered 2020-01-09: 20 mg via ORAL

## 2020-01-09 MED ORDER — DEXAMETHASONE SODIUM PHOSPHATE 10 MG/ML IJ SOLN
INTRAMUSCULAR | Status: DC | PRN
  Administered 2020-01-09: 10 mg via INTRAVENOUS

## 2020-01-09 MED ORDER — LIDOCAINE HCL (CARDIAC) PF 100 MG/5ML IV SOSY
PREFILLED_SYRINGE | INTRAVENOUS | Status: DC | PRN
  Administered 2020-01-09: 50 mg via INTRAVENOUS

## 2020-01-09 MED ORDER — ORAL CARE MOUTH RINSE: 15.0000 mL | Freq: Once | OROMUCOSAL | Status: AC

## 2020-01-09 MED ORDER — SODIUM CHLORIDE 0.9 % IV SOLN: INTRAVENOUS | Status: DC

## 2020-01-09 MED ORDER — EPHEDRINE SULFATE 50 MG/ML IJ SOLN
INTRAMUSCULAR | Status: DC | PRN
  Administered 2020-01-09 (×2): 25 mg via INTRAVENOUS

## 2020-01-09 MED ORDER — CEFAZOLIN SODIUM-DEXTROSE 1-4 GM/50ML-% IV SOLN
INTRAVENOUS | Status: AC
  Filled 2020-01-09: qty 50

## 2020-01-09 MED ORDER — BUPIVACAINE LIPOSOME 1.3 % IJ SUSP
INTRAMUSCULAR | Status: DC | PRN
  Administered 2020-01-09: 16 mL

## 2020-01-09 MED ORDER — FAMOTIDINE 20 MG PO TABS
ORAL_TABLET | ORAL | Status: AC
  Filled 2020-01-09: qty 1

## 2020-01-09 MED ORDER — CHLORHEXIDINE GLUCONATE 0.12 % MT SOLN: 15.0000 mL | Freq: Once | OROMUCOSAL | Status: AC

## 2020-01-09 MED ORDER — FAMOTIDINE 20 MG PO TABS: 20.0000 mg | ORAL_TABLET | Freq: Once | ORAL | Status: DC

## 2020-01-09 MED ORDER — BUPIVACAINE HCL (PF) 0.5 % IJ SOLN
INTRAMUSCULAR | Status: AC
  Filled 2020-01-09: qty 30

## 2020-01-09 MED ORDER — CEFAZOLIN SODIUM-DEXTROSE 1-4 GM/50ML-% IV SOLN
1.0000 g | INTRAVENOUS | Status: AC
  Administered 2020-01-09: 1 g via INTRAVENOUS

## 2020-01-09 MED ORDER — BUPIVACAINE LIPOSOME 1.3 % IJ SUSP
INTRAMUSCULAR | Status: AC
  Filled 2020-01-09: qty 20

## 2020-01-09 MED ORDER — EPHEDRINE 5 MG/ML INJ
INTRAVENOUS | Status: AC
  Filled 2020-01-09: qty 10

## 2020-01-09 MED ORDER — FENTANYL CITRATE (PF) 100 MCG/2ML IJ SOLN
INTRAMUSCULAR | Status: AC
  Filled 2020-01-09: qty 2

## 2020-01-09 MED ORDER — PROPOFOL 10 MG/ML IV BOLUS
INTRAVENOUS | Status: AC
  Filled 2020-01-09: qty 20

## 2020-01-09 MED ORDER — PROPOFOL 10 MG/ML IV BOLUS
INTRAVENOUS | Status: DC | PRN
  Administered 2020-01-09: 60 mg via INTRAVENOUS

## 2020-01-09 MED ORDER — CHLORHEXIDINE GLUCONATE CLOTH 2 % EX PADS: 6.0000 | MEDICATED_PAD | Freq: Once | CUTANEOUS | Status: DC

## 2020-01-09 MED ORDER — HYDROCODONE-ACETAMINOPHEN 5-325 MG PO TABS: 1.0000 | ORAL_TABLET | Freq: Four times a day (QID) | ORAL | 0 refills | Status: DC | PRN

## 2020-01-09 MED ORDER — ONDANSETRON HCL 4 MG/2ML IJ SOLN
INTRAMUSCULAR | Status: AC
  Filled 2020-01-09: qty 2

## 2020-01-09 MED ORDER — ONDANSETRON HCL 4 MG/2ML IJ SOLN: 4.0000 mg | Freq: Once | INTRAMUSCULAR | Status: DC | PRN

## 2020-01-09 MED ORDER — FENTANYL CITRATE (PF) 100 MCG/2ML IJ SOLN: 25.0000 ug | INTRAMUSCULAR | Status: DC | PRN

## 2020-01-09 SURGICAL SUPPLY — 31 items
BLADE MED AGGRESSIVE (BLADE) ×3 IMPLANT
BNDG ELASTIC 4X5.8 VLCR NS LF (GAUZE/BANDAGES/DRESSINGS) ×3 IMPLANT
BNDG GAUZE 4.5X4.1 6PLY STRL (MISCELLANEOUS) ×3 IMPLANT
BRUSH SCRUB EZ  4% CHG (MISCELLANEOUS) ×2
BRUSH SCRUB EZ 4% CHG (MISCELLANEOUS) ×1 IMPLANT
CANISTER SUCT 1200ML W/VALVE (MISCELLANEOUS) ×3 IMPLANT
CHLORAPREP W/TINT 26 (MISCELLANEOUS) ×3 IMPLANT
COVER WAND RF STERILE (DRAPES) ×3 IMPLANT
ELECT CAUTERY BLADE 6.4 (BLADE) ×3 IMPLANT
ELECT REM PT RETURN 9FT ADLT (ELECTROSURGICAL) ×3
ELECTRODE REM PT RTRN 9FT ADLT (ELECTROSURGICAL) ×1 IMPLANT
GAUZE SPONGE 4X4 12PLY STRL (GAUZE/BANDAGES/DRESSINGS) ×3 IMPLANT
GLOVE BIO SURGEON STRL SZ7 (GLOVE) ×3 IMPLANT
GLOVE INDICATOR 7.5 STRL GRN (GLOVE) ×3 IMPLANT
GLOVE SURG SYN 8.0 (GLOVE) ×3 IMPLANT
GOWN STRL REUS W/ TWL LRG LVL3 (GOWN DISPOSABLE) ×2 IMPLANT
GOWN STRL REUS W/ TWL XL LVL3 (GOWN DISPOSABLE) ×1 IMPLANT
GOWN STRL REUS W/TWL LRG LVL3 (GOWN DISPOSABLE) ×4
GOWN STRL REUS W/TWL XL LVL3 (GOWN DISPOSABLE) ×2
HANDLE YANKAUER SUCT BULB TIP (MISCELLANEOUS) ×3 IMPLANT
KIT TURNOVER KIT A (KITS) ×3 IMPLANT
LABEL OR SOLS (LABEL) ×3 IMPLANT
NS IRRIG 500ML POUR BTL (IV SOLUTION) ×3 IMPLANT
PACK EXTREMITY (MISCELLANEOUS) ×3 IMPLANT
PAD PREP 24X41 OB/GYN DISP (PERSONAL CARE ITEMS) ×3 IMPLANT
STOCKINETTE STRL 6IN 960660 (GAUZE/BANDAGES/DRESSINGS) ×3 IMPLANT
SUT ETHILON 3-0 FS-10 30 BLK (SUTURE) ×3
SUT VIC AB 3-0 SH 27 (SUTURE) ×2
SUT VIC AB 3-0 SH 27X BRD (SUTURE) ×1 IMPLANT
SUTURE EHLN 3-0 FS-10 30 BLK (SUTURE) ×1 IMPLANT
SWAB DUAL CULTURE TRANS RED ST (MISCELLANEOUS) ×3 IMPLANT

## 2020-01-09 NOTE — H&P (Signed)
[facility] History & Physical Update  The patient was interviewed and re-examined.  The patient's previous History and Physical has been reviewed and is unchanged.  There is no change in the plan of care. We plan to proceed with the scheduled procedure.  Hortencia Pilar, MD  01/09/2020, 11:47 AM

## 2020-01-09 NOTE — Discharge Instructions (Signed)

## 2020-01-09 NOTE — Op Note (Signed)
    OPERATIVE NOTE   PROCEDURE: First ray amputation left foot  PRE-OPERATIVE DIAGNOSIS: Gangrene left great toe  POST-OPERATIVE DIAGNOSIS: Same  SURGEON: Hortencia Pilar  ASSISTANT(S): None  ANESTHESIA: general  ESTIMATED BLOOD LOSS: 15 cc  FINDING(S): Dry gangrene left great toe no purulence encountered  SPECIMEN(S): Left great toe with metatarsal head to pathology  INDICATIONS:   Stacy Pham is a 86 y.o. female who presents with dry gangrene of the left great toe.  She is now undergoing toe amputation.  Risks and benefits of been reviewed all questions answered patient agrees to proceed..  DESCRIPTION: After full informed written consent was obtained from the patient, the patient was brought back to the operating room and placed supine upon the operating table.  Prior to induction, the patient received IV antibiotics.   After obtaining adequate anesthesia, the patient was then prepped and draped in the standard fashion for a left first ray amputation.  Using 15 blade scalpel a keyhole incision was created around the base of the great toe extending at the over the metatarsal shaft.  Dissection is carried down through the soft tissues using Bovie cautery exposing the shaft of the metatarsal.  Periosteal elevator was then used to raise the adherent tissues off the shaft of the metatarsal and a micro air saw was used to transect the bone.  The first metatarsal head and great toe were then excised en bloc using Bovie cautery.  The wound is then irrigated with a liter of saline hemostasis is achieved with Bovie cautery.  The wound is then closed in layers using interrupted figure-of-eight 3-0 Vicryl for the subcutaneous tissues and interrupted 4-0 nylon vertical mattress sutures for the skin edge.  Fluffed gauze followed by Kerlix followed by an Ace wrap were then applied.    The patient tolerated this procedure well.   COMPLICATIONS: None  CONDITION: [name]  Office: [phone]   01/09/2020, 1:14 PM

## 2020-01-09 NOTE — OR Nursing (Signed)
Dr Rosey Bath and Dr Delana Meyer both aware of preop labs and labs from today. No new orders at this time.

## 2020-01-09 NOTE — Anesthesia Procedure Notes (Signed)
Procedure Name: LMA Insertion Performed by: Gaynelle Cage, CRNA Pre-anesthesia Checklist: Patient identified, Emergency Drugs available, Suction available and Patient being monitored Patient Re-evaluated:Patient Re-evaluated prior to induction Oxygen Delivery Method: Circle system utilized Preoxygenation: Pre-oxygenation with 100% oxygen Induction Type: IV induction LMA: LMA inserted LMA Size: 3.5 Tube type: Oral Number of attempts: 1 Placement Confirmation: positive ETCO2 and breath sounds checked- equal and bilateral Tube secured with: Tape Dental Injury: Teeth and Oropharynx as per pre-operative assessment

## 2020-01-09 NOTE — Anesthesia Preprocedure Evaluation (Signed)
Anesthesia Evaluation  Patient identified by MRN, date of birth, ID band Patient awake    Reviewed: Allergy & Precautions, H&P , NPO status , Patient's Chart, lab work & pertinent test results  History of Anesthesia Complications Negative for: history of anesthetic complications  Airway Mallampati: II  TM Distance: >3 FB     Dental  (+) Upper Dentures, Lower Dentures   Pulmonary neg pulmonary ROS, neg shortness of breath, neg COPD,           Cardiovascular hypertension, (-) angina+ Peripheral Vascular Disease and +CHF (HFpEF)  (-) Past MI and (-) Cardiac Stents (-) dysrhythmias   Echo 10/24/17: - Left ventricle: The cavity size was normal. There was mild   concentric hypertrophy. Systolic function was normal. The   estimated ejection fraction was in the range of 60% to 65%.   Doppler parameters are consistent with abnormal left ventricular   relaxation (grade 1 diastolic dysfunction). - Mitral valve: Calcified annulus. There was mild regurgitation. - Left atrium: The atrium was mildly dilated. - Right atrium: The atrium was mildly dilated. - Pulmonary arteries: Systolic pressure was mildly increased. PA   peak pressure: 40 mm Hg (S).   Neuro/Psych neg Seizures negative neurological ROS  negative psych ROS   GI/Hepatic negative GI ROS, Neg liver ROS,   Endo/Other  diabetes  Renal/GU ESRF and DialysisRenal disease     Musculoskeletal  (+) Arthritis ,   Abdominal   Peds  Hematology negative hematology ROS (+)   Anesthesia Other Findings Past Medical History: No date: (HFpEF) heart failure with preserved ejection fraction ([place])     Comment:  a. 10/2017 Echo: EF 60-65%, Gr1 DD, mild MR, mildly dil               LA/RA. PASP 16mmHg. No date: Arthritis No date: Back pain No date: Bronchitis No date: Cataract No date: Diabetes mellitus without complication (HCC)     Comment:  no meds currently No date: Dialysis  patient [facility]) No date: ESRD (end stage renal disease) ([place])     Comment:  a. 2018 - initially on HD but then transitioned to               nightly PD in 08/2017. No date: Gout No date: Hypertension No date: PAF (paroxysmal atrial fibrillation) (HCC)     Comment:  a. CHA2DS2VASc = 5-->eliquis 2.5 BID.  Past Surgical History: 09/13/2018: AV FISTULA PLACEMENT; Right     Comment:  Procedure: INSERTION OF ARTERIOVENOUS (AV) GORE-TEX               GRAFT ARM;  Surgeon: Katha Cabal, MD;  Location:               [facility] ORS;  Service: Vascular;  Laterality: Right; 06/08/2017: CAPD INSERTION; N/A     Comment:  Procedure: [place]  (CAPD) CATHETER;  Surgeon: Katha Cabal, MD;  Location: [facility] ORS;  Service: Vascular;                Laterality: N/A; No date: CATARACT EXTRACTION, BILATERAL; Bilateral 03/18/2017: DIALYSIS/PERMA CATHETER INSERTION; N/A     Comment:  Procedure: DIALYSIS/PERMA CATHETER INSERTION;  Surgeon:               Katha Cabal, MD;  Location: [facility] INVASIVE  CV LAB;               Service: Cardiovascular;  Laterality: N/A; 10/20/2017: DIALYSIS/PERMA CATHETER REMOVAL; N/A     Comment:  Procedure: DIALYSIS/PERMA CATHETER REMOVAL;  Surgeon:               Algernon Huxley, MD;  Location: [place] [facility];                Service: Cardiovascular;  Laterality: N/A; No date: EYE SURGERY     Reproductive/Obstetrics negative OB ROS                             Anesthesia Physical  Anesthesia Plan  ASA: IV  Anesthesia Plan: General LMA   Post-op Pain Management:    Induction: Intravenous  PONV Risk Score and Plan: Dexamethasone, Ondansetron and Treatment may vary due to age or medical condition  Airway Management Planned: LMA  Additional Equipment:   Intra-op Plan:   Post-operative Plan: Extubation in OR  Informed Consent: I have reviewed the patients  History and Physical, chart, labs and discussed the procedure including the risks, benefits and alternatives for the proposed anesthesia with the patient or authorized representative who has indicated his/her understanding and acceptance.     Dental Advisory Given  Plan Discussed with: Anesthesiologist and CRNA  Anesthesia Plan Comments:         Anesthesia Quick Evaluation

## 2020-01-09 NOTE — Transfer of Care (Signed)
Immediate Anesthesia Transfer of Care Note  Patient: Stacy Pham  Procedure(s) Performed: AMPUTATION RAY ( GREAT TOE ) (Left )  Patient Location: PACU  Anesthesia Type:General  Level of Consciousness: drowsy  Airway & Oxygen Therapy: Patient Spontanous Breathing and Patient connected to face mask oxygen  Post-op Assessment: Report given to RN and Post -op Vital signs reviewed and stable  Post vital signs: Reviewed and stable  Last Vitals:  Vitals Value Taken Time  BP 137/48 01/09/20 1316  Temp    Pulse 58 01/09/20 1321  Resp 16 01/09/20 1321  SpO2 100 % 01/09/20 1321  Vitals shown include unvalidated device data.  Last Pain:  Vitals:   01/09/20 0847  TempSrc: Temporal  PainSc: 0-No pain         Complications: No complications documented.

## 2020-01-11 LAB — SURGICAL PATHOLOGY

## 2020-01-11 NOTE — Anesthesia Postprocedure Evaluation (Signed)
Anesthesia Post Note  Patient: Lilith Solana  Procedure(s) Performed: AMPUTATION RAY ( GREAT TOE ) (Left )  Patient location during evaluation: PACU Anesthesia Type: General Level of consciousness: awake and alert Pain management: pain level controlled Vital Signs Assessment: post-procedure vital signs reviewed and stable Respiratory status: spontaneous breathing, nonlabored ventilation, respiratory function stable and patient connected to nasal cannula oxygen Cardiovascular status: blood pressure returned to baseline and stable Postop Assessment: no apparent nausea or vomiting Anesthetic complications: no   No complications documented.   Last Vitals:  Vitals:   01/09/20 1352 01/09/20 1407  BP:  (!) 160/46  Pulse: (!) 52 (!) 59  Resp: 19 18  Temp: (!) 36.1 C (!) 36.3 C  SpO2: 94% 100%    Last Pain:  Vitals:   01/09/20 1407  TempSrc: Temporal                 Martha Clan

## 2020-01-12 DIAGNOSIS — N186 End stage renal disease: Secondary | ICD-10-CM | POA: Diagnosis not present

## 2020-01-12 DIAGNOSIS — D509 Iron deficiency anemia, unspecified: Secondary | ICD-10-CM | POA: Diagnosis not present

## 2020-01-12 DIAGNOSIS — Z992 Dependence on renal dialysis: Secondary | ICD-10-CM | POA: Diagnosis not present

## 2020-01-12 DIAGNOSIS — D631 Anemia in chronic kidney disease: Secondary | ICD-10-CM | POA: Diagnosis not present

## 2020-01-12 DIAGNOSIS — N2581 Secondary hyperparathyroidism of renal origin: Secondary | ICD-10-CM | POA: Diagnosis not present

## 2020-01-15 ENCOUNTER — Other Ambulatory Visit (INDEPENDENT_AMBULATORY_CARE_PROVIDER_SITE_OTHER): Payer: Self-pay | Admitting: Vascular Surgery

## 2020-01-15 DIAGNOSIS — I7025 Atherosclerosis of native arteries of other extremities with ulceration: Secondary | ICD-10-CM

## 2020-01-15 DIAGNOSIS — Z992 Dependence on renal dialysis: Secondary | ICD-10-CM | POA: Diagnosis not present

## 2020-01-15 DIAGNOSIS — D631 Anemia in chronic kidney disease: Secondary | ICD-10-CM | POA: Diagnosis not present

## 2020-01-15 DIAGNOSIS — N186 End stage renal disease: Secondary | ICD-10-CM | POA: Diagnosis not present

## 2020-01-15 DIAGNOSIS — N2581 Secondary hyperparathyroidism of renal origin: Secondary | ICD-10-CM | POA: Diagnosis not present

## 2020-01-15 DIAGNOSIS — D509 Iron deficiency anemia, unspecified: Secondary | ICD-10-CM | POA: Diagnosis not present

## 2020-01-16 ENCOUNTER — Telehealth (INDEPENDENT_AMBULATORY_CARE_PROVIDER_SITE_OTHER): Payer: Self-pay | Admitting: Vascular Surgery

## 2020-01-16 NOTE — Telephone Encounter (Signed)
Stacy Pham called to let us know that he went to Stacy Pham's house to provide care and there was no response via phone or while he was at the door. He advised [place] that this would be a delay of care.

## 2020-01-17 DIAGNOSIS — N186 End stage renal disease: Secondary | ICD-10-CM | POA: Diagnosis not present

## 2020-01-17 DIAGNOSIS — D509 Iron deficiency anemia, unspecified: Secondary | ICD-10-CM | POA: Diagnosis not present

## 2020-01-17 DIAGNOSIS — N2581 Secondary hyperparathyroidism of renal origin: Secondary | ICD-10-CM | POA: Diagnosis not present

## 2020-01-17 DIAGNOSIS — Z992 Dependence on renal dialysis: Secondary | ICD-10-CM | POA: Diagnosis not present

## 2020-01-17 DIAGNOSIS — D631 Anemia in chronic kidney disease: Secondary | ICD-10-CM | POA: Diagnosis not present

## 2020-01-18 DIAGNOSIS — M6281 Muscle weakness (generalized): Secondary | ICD-10-CM | POA: Diagnosis not present

## 2020-01-18 DIAGNOSIS — I70202 Unspecified atherosclerosis of native arteries of extremities, left leg: Secondary | ICD-10-CM | POA: Diagnosis not present

## 2020-01-18 DIAGNOSIS — E1122 Type 2 diabetes mellitus with diabetic chronic kidney disease: Secondary | ICD-10-CM | POA: Diagnosis not present

## 2020-01-18 DIAGNOSIS — M109 Gout, unspecified: Secondary | ICD-10-CM | POA: Diagnosis not present

## 2020-01-18 DIAGNOSIS — I480 Paroxysmal atrial fibrillation: Secondary | ICD-10-CM | POA: Diagnosis not present

## 2020-01-18 DIAGNOSIS — M549 Dorsalgia, unspecified: Secondary | ICD-10-CM | POA: Diagnosis not present

## 2020-01-18 DIAGNOSIS — N186 End stage renal disease: Secondary | ICD-10-CM | POA: Diagnosis not present

## 2020-01-18 DIAGNOSIS — Z89412 Acquired absence of left great toe: Secondary | ICD-10-CM | POA: Diagnosis not present

## 2020-01-18 DIAGNOSIS — Z992 Dependence on renal dialysis: Secondary | ICD-10-CM | POA: Diagnosis not present

## 2020-01-18 DIAGNOSIS — I5032 Chronic diastolic (congestive) heart failure: Secondary | ICD-10-CM | POA: Diagnosis not present

## 2020-01-18 DIAGNOSIS — I132 Hypertensive heart and chronic kidney disease with heart failure and with stage 5 chronic kidney disease, or end stage renal disease: Secondary | ICD-10-CM | POA: Diagnosis not present

## 2020-01-18 DIAGNOSIS — Z4781 Encounter for orthopedic aftercare following surgical amputation: Secondary | ICD-10-CM | POA: Diagnosis not present

## 2020-01-19 DIAGNOSIS — D631 Anemia in chronic kidney disease: Secondary | ICD-10-CM | POA: Diagnosis not present

## 2020-01-19 DIAGNOSIS — N2581 Secondary hyperparathyroidism of renal origin: Secondary | ICD-10-CM | POA: Diagnosis not present

## 2020-01-19 DIAGNOSIS — D509 Iron deficiency anemia, unspecified: Secondary | ICD-10-CM | POA: Diagnosis not present

## 2020-01-19 DIAGNOSIS — N186 End stage renal disease: Secondary | ICD-10-CM | POA: Diagnosis not present

## 2020-01-19 DIAGNOSIS — Z992 Dependence on renal dialysis: Secondary | ICD-10-CM | POA: Diagnosis not present

## 2020-01-20 NOTE — Progress Notes (Signed)
Patient ID: Stacy Pham, female   DOB: Mar 08, 1931, 86 y.o.   MRN: [id number]  No chief complaint on file.   HPI Stacy Pham is a 86 y.o. female.   Patient denies pain; no drainage reported   Past Medical History:  Diagnosis Date  . (HFpEF) heart failure with preserved ejection fraction ([place])    a. 10/2017 Echo: EF 60-65%, Gr1 DD, mild MR, mildly dil LA/RA. PASP 61mHg.  . Arthritis   . Back pain   . Bronchitis   . Cataract   . Diabetes mellitus without complication (HCC)    no meds currently  . Dialysis patient ([facility]   . Dysrhythmia   . ESRD (end stage renal disease) ([place]    a. 2018 - initially on HD but then transitioned to nightly PD in 08/2017.  .Marland KitchenGout   . History of methicillin resistant staphylococcus aureus (MRSA)   . Hypertension   . PAF (paroxysmal atrial fibrillation) (HCC)    a. CHA2DS2VASc = 5-->eliquis 2.5 BID.    Past Surgical History:  Procedure Laterality Date  . A/V FISTULAGRAM Right 11/13/2019   Procedure: A/V FISTULAGRAM;  Surgeon: SKatha Cabal MD;  [facility];  Service: Cardiovascular;  Laterality: Right;  . AMPUTATION Left 01/09/2020   Procedure: AMPUTATION RAY ( GREAT TOE );  Surgeon: SKatha Cabal MD;  Location: [facility];  Service: Vascular;  Laterality: Left;  . AV FISTULA PLACEMENT Right 09/13/2018   Procedure: INSERTION OF ARTERIOVENOUS (AV) GORE-TEX GRAFT ARM;  Surgeon: SKatha Cabal MD;  Location: [facility];  Service: Vascular;  Laterality: Right;  . CAPD INSERTION N/A 06/08/2017   Procedure: LAPAROSCOPIC INSERTION CONTINUOUS AMBULATORY PERITONEAL DIALYSIS  (CAPD) CATHETER;  Surgeon: SKatha Cabal MD;  Location: [facility];  Service: Vascular;  Laterality: N/A;  . CATARACT EXTRACTION, BILATERAL Bilateral   . DIALYSIS/PERMA CATHETER INSERTION N/A 03/18/2017   Procedure: DIALYSIS/PERMA CATHETER INSERTION;  Surgeon: SKatha Cabal MD;  [facility];  Service: Cardiovascular;   Laterality: N/A;  . DIALYSIS/PERMA CATHETER REMOVAL N/A 10/20/2017   Procedure: DIALYSIS/PERMA CATHETER REMOVAL;  Surgeon: DAlgernon Huxley MD;  [facility];  Service: Cardiovascular;  Laterality: N/A;  . EYE SURGERY    . LOWER EXTREMITY ANGIOGRAPHY Left 09/11/2019   Procedure: LOWER EXTREMITY ANGIOGRAPHY;  Surgeon: SKatha Cabal MD;  [facility];  Service: Cardiovascular;  Laterality: Left;  . LOWER EXTREMITY ANGIOGRAPHY Right 10/17/2019   Procedure: LOWER EXTREMITY ANGIOGRAPHY;  Surgeon: SKatha Cabal MD;  [facility];  Service: Cardiovascular;  Laterality: Right;  . LOWER EXTREMITY ANGIOGRAPHY Left 11/20/2019   Procedure: LOWER EXTREMITY ANGIOGRAPHY;  Surgeon: SKatha Cabal MD;  [facility];  Service: Cardiovascular;  Laterality: Left;  . REMOVAL OF A DIALYSIS CATHETER N/A 02/23/2019   Procedure: REMOVAL OF A DIALYSIS CATHETER ( PD CATH);  Surgeon: SKatha Cabal MD;  Location: [facility];  Service: Vascular;  Laterality: N/A;      No Known Allergies  Current Outpatient Medications  Medication Sig Dispense Refill  . acetaminophen (TYLENOL 8 HOUR ARTHRITIS PAIN) 650 MG CR tablet Take 650 mg every 8 (eight) hours as needed by mouth for pain.    .Marland Kitchenallopurinol (ZYLOPRIM) 100 MG tablet TAKE 1 TABLET BY MOUTH EVERYDAY AT BEDTIME (Patient not taking: Reported on 01/09/2020) 90 tablet 3  . amiodarone (PACERONE) 200 MG tablet Take 1 tablet (200 mg total) by mouth every  morning. Take 1 tablet (200 mg) by mouth once daily (Patient taking differently: Take 200 mg by mouth every morning. ) 90 tablet 3  . amLODipine (NORVASC) 5 MG tablet Take 1 tablet (5 mg total) by mouth daily. (Patient taking differently: Take 5 mg by mouth every morning. ) 90 tablet 2  . atorvastatin (LIPITOR) 20 MG tablet Take 1 tablet (20 mg total) by mouth every evening. 90 tablet 0  . blood glucose meter kit and supplies KIT Please dispense  either One Touch or Bayer Contour She must have one of these machines since she is on peritoneal dialysis. Use up to four times daily as directed. (FOR ICD-9 250.00, 250.01). 1 each 0  . clopidogrel (PLAVIX) 75 MG tablet Take 1 tablet (75 mg total) by mouth daily. 30 tablet 4  . colchicine 0.6 MG tablet Take 0.6 mg by mouth daily as needed (gout).    [name] ELIQUIS 2.5 MG TABS tablet TAKE 1 TABLET BY MOUTH TWICE A DAY (Patient taking differently: Take 2.5 mg by mouth 2 (two) times daily. ) 180 tablet 1  . Ferric Citrate (AURYXIA PO) Take 220 mg by mouth 3 (three) times daily.    [name] HYDROcodone-acetaminophen (NORCO) 5-325 MG tablet Take 1-2 tablets by mouth every 6 (six) hours as needed for moderate pain or severe pain. 40 tablet 0  . metoprolol tartrate (LOPRESSOR) 25 MG tablet Take 0.5 tablets (12.5 mg total) by mouth 2 (two) times daily. 90 tablet 2   No current facility-administered medications for this visit.        Physical Exam There were no vitals taken for this visit. Gen:  WD/WN, NAD Skin: incision C/D/I     Assessment/Plan: 1. Atherosclerosis of native arteries of the extremities with ulceration ([place]) Doing well and appears to be healing  Will leave the sutures in for two more weeks      Hortencia Pilar 01/20/2020, 4:13 PM   This note was created with Dragon medical transcription system.  Any errors from dictation are unintentional.

## 2020-01-21 ENCOUNTER — Other Ambulatory Visit: Payer: Self-pay

## 2020-01-21 ENCOUNTER — Ambulatory Visit (INDEPENDENT_AMBULATORY_CARE_PROVIDER_SITE_OTHER): Payer: Medicare Other

## 2020-01-21 ENCOUNTER — Encounter (INDEPENDENT_AMBULATORY_CARE_PROVIDER_SITE_OTHER): Payer: Self-pay | Admitting: Vascular Surgery

## 2020-01-21 ENCOUNTER — Ambulatory Visit (INDEPENDENT_AMBULATORY_CARE_PROVIDER_SITE_OTHER): Payer: Medicare Other | Admitting: Vascular Surgery

## 2020-01-21 VITALS — BP 203/67 | HR 61 | Ht 63.0 in | Wt 156.0 lb

## 2020-01-21 DIAGNOSIS — I7025 Atherosclerosis of native arteries of other extremities with ulceration: Secondary | ICD-10-CM | POA: Diagnosis not present

## 2020-01-23 DIAGNOSIS — D509 Iron deficiency anemia, unspecified: Secondary | ICD-10-CM | POA: Diagnosis not present

## 2020-01-23 DIAGNOSIS — N186 End stage renal disease: Secondary | ICD-10-CM | POA: Diagnosis not present

## 2020-01-23 DIAGNOSIS — Z992 Dependence on renal dialysis: Secondary | ICD-10-CM | POA: Diagnosis not present

## 2020-01-23 DIAGNOSIS — D631 Anemia in chronic kidney disease: Secondary | ICD-10-CM | POA: Diagnosis not present

## 2020-01-23 DIAGNOSIS — N2581 Secondary hyperparathyroidism of renal origin: Secondary | ICD-10-CM | POA: Diagnosis not present

## 2020-01-24 DIAGNOSIS — E1122 Type 2 diabetes mellitus with diabetic chronic kidney disease: Secondary | ICD-10-CM | POA: Diagnosis not present

## 2020-01-24 DIAGNOSIS — I5032 Chronic diastolic (congestive) heart failure: Secondary | ICD-10-CM | POA: Diagnosis not present

## 2020-01-24 DIAGNOSIS — Z4781 Encounter for orthopedic aftercare following surgical amputation: Secondary | ICD-10-CM | POA: Diagnosis not present

## 2020-01-24 DIAGNOSIS — N186 End stage renal disease: Secondary | ICD-10-CM | POA: Diagnosis not present

## 2020-01-24 DIAGNOSIS — I132 Hypertensive heart and chronic kidney disease with heart failure and with stage 5 chronic kidney disease, or end stage renal disease: Secondary | ICD-10-CM | POA: Diagnosis not present

## 2020-01-24 DIAGNOSIS — I70202 Unspecified atherosclerosis of native arteries of extremities, left leg: Secondary | ICD-10-CM | POA: Diagnosis not present

## 2020-01-25 DIAGNOSIS — I70202 Unspecified atherosclerosis of native arteries of extremities, left leg: Secondary | ICD-10-CM | POA: Diagnosis not present

## 2020-01-25 DIAGNOSIS — E1122 Type 2 diabetes mellitus with diabetic chronic kidney disease: Secondary | ICD-10-CM | POA: Diagnosis not present

## 2020-01-25 DIAGNOSIS — Z4781 Encounter for orthopedic aftercare following surgical amputation: Secondary | ICD-10-CM | POA: Diagnosis not present

## 2020-01-25 DIAGNOSIS — N186 End stage renal disease: Secondary | ICD-10-CM | POA: Diagnosis not present

## 2020-01-25 DIAGNOSIS — I132 Hypertensive heart and chronic kidney disease with heart failure and with stage 5 chronic kidney disease, or end stage renal disease: Secondary | ICD-10-CM | POA: Diagnosis not present

## 2020-01-25 DIAGNOSIS — I5032 Chronic diastolic (congestive) heart failure: Secondary | ICD-10-CM | POA: Diagnosis not present

## 2020-01-26 DIAGNOSIS — D631 Anemia in chronic kidney disease: Secondary | ICD-10-CM | POA: Diagnosis not present

## 2020-01-26 DIAGNOSIS — N2581 Secondary hyperparathyroidism of renal origin: Secondary | ICD-10-CM | POA: Diagnosis not present

## 2020-01-26 DIAGNOSIS — Z992 Dependence on renal dialysis: Secondary | ICD-10-CM | POA: Diagnosis not present

## 2020-01-26 DIAGNOSIS — N186 End stage renal disease: Secondary | ICD-10-CM | POA: Diagnosis not present

## 2020-01-26 DIAGNOSIS — D509 Iron deficiency anemia, unspecified: Secondary | ICD-10-CM | POA: Diagnosis not present

## 2020-01-29 DIAGNOSIS — Z992 Dependence on renal dialysis: Secondary | ICD-10-CM | POA: Diagnosis not present

## 2020-01-29 DIAGNOSIS — N186 End stage renal disease: Secondary | ICD-10-CM | POA: Diagnosis not present

## 2020-01-29 DIAGNOSIS — D509 Iron deficiency anemia, unspecified: Secondary | ICD-10-CM | POA: Diagnosis not present

## 2020-01-29 DIAGNOSIS — N2581 Secondary hyperparathyroidism of renal origin: Secondary | ICD-10-CM | POA: Diagnosis not present

## 2020-01-29 DIAGNOSIS — D631 Anemia in chronic kidney disease: Secondary | ICD-10-CM | POA: Diagnosis not present

## 2020-01-30 DIAGNOSIS — I5032 Chronic diastolic (congestive) heart failure: Secondary | ICD-10-CM | POA: Diagnosis not present

## 2020-01-30 DIAGNOSIS — N186 End stage renal disease: Secondary | ICD-10-CM | POA: Diagnosis not present

## 2020-01-30 DIAGNOSIS — Z4781 Encounter for orthopedic aftercare following surgical amputation: Secondary | ICD-10-CM | POA: Diagnosis not present

## 2020-01-30 DIAGNOSIS — I70202 Unspecified atherosclerosis of native arteries of extremities, left leg: Secondary | ICD-10-CM | POA: Diagnosis not present

## 2020-01-30 DIAGNOSIS — E1122 Type 2 diabetes mellitus with diabetic chronic kidney disease: Secondary | ICD-10-CM | POA: Diagnosis not present

## 2020-01-30 DIAGNOSIS — I132 Hypertensive heart and chronic kidney disease with heart failure and with stage 5 chronic kidney disease, or end stage renal disease: Secondary | ICD-10-CM | POA: Diagnosis not present

## 2020-01-31 DIAGNOSIS — Z992 Dependence on renal dialysis: Secondary | ICD-10-CM | POA: Diagnosis not present

## 2020-01-31 DIAGNOSIS — D509 Iron deficiency anemia, unspecified: Secondary | ICD-10-CM | POA: Diagnosis not present

## 2020-01-31 DIAGNOSIS — N186 End stage renal disease: Secondary | ICD-10-CM | POA: Diagnosis not present

## 2020-01-31 DIAGNOSIS — D631 Anemia in chronic kidney disease: Secondary | ICD-10-CM | POA: Diagnosis not present

## 2020-01-31 DIAGNOSIS — N2581 Secondary hyperparathyroidism of renal origin: Secondary | ICD-10-CM | POA: Diagnosis not present

## 2020-02-01 DIAGNOSIS — E1122 Type 2 diabetes mellitus with diabetic chronic kidney disease: Secondary | ICD-10-CM | POA: Diagnosis not present

## 2020-02-01 DIAGNOSIS — I70202 Unspecified atherosclerosis of native arteries of extremities, left leg: Secondary | ICD-10-CM | POA: Diagnosis not present

## 2020-02-01 DIAGNOSIS — I5032 Chronic diastolic (congestive) heart failure: Secondary | ICD-10-CM | POA: Diagnosis not present

## 2020-02-01 DIAGNOSIS — N186 End stage renal disease: Secondary | ICD-10-CM | POA: Diagnosis not present

## 2020-02-01 DIAGNOSIS — Z4781 Encounter for orthopedic aftercare following surgical amputation: Secondary | ICD-10-CM | POA: Diagnosis not present

## 2020-02-01 DIAGNOSIS — I132 Hypertensive heart and chronic kidney disease with heart failure and with stage 5 chronic kidney disease, or end stage renal disease: Secondary | ICD-10-CM | POA: Diagnosis not present

## 2020-02-02 DIAGNOSIS — D509 Iron deficiency anemia, unspecified: Secondary | ICD-10-CM | POA: Diagnosis not present

## 2020-02-02 DIAGNOSIS — N186 End stage renal disease: Secondary | ICD-10-CM | POA: Diagnosis not present

## 2020-02-02 DIAGNOSIS — N2581 Secondary hyperparathyroidism of renal origin: Secondary | ICD-10-CM | POA: Diagnosis not present

## 2020-02-02 DIAGNOSIS — D631 Anemia in chronic kidney disease: Secondary | ICD-10-CM | POA: Diagnosis not present

## 2020-02-02 DIAGNOSIS — Z992 Dependence on renal dialysis: Secondary | ICD-10-CM | POA: Diagnosis not present

## 2020-02-03 NOTE — Progress Notes (Signed)
Patient ID: Stacy Pham, female   DOB: [birth year], 86 y.o.   MRN: [id number]  No chief complaint on file.   HPI Stacy Pham is a 86 y.o. female.    Patient denies pain; no drainage reported No fever chills  She is having bleeding problems with her AV graft   Past Medical History:  Diagnosis Date  . (HFpEF) heart failure with preserved ejection fraction ([place])    a. 10/2017 Echo: EF 60-65%, Gr1 DD, mild MR, mildly dil LA/RA. PASP 78mHg.  . Arthritis   . Back pain   . Bronchitis   . Cataract   . Diabetes mellitus without complication (HCC)    no meds currently  . Dialysis patient ([facility]   . Dysrhythmia   . ESRD (end stage renal disease) ([place]    a. 2018 - initially on HD but then transitioned to nightly PD in 08/2017.  .Marland KitchenGout   . History of methicillin resistant staphylococcus aureus (MRSA)   . Hypertension   . PAF (paroxysmal atrial fibrillation) (HCC)    a. CHA2DS2VASc = 5-->eliquis 2.5 BID.    Past Surgical History:  Procedure Laterality Date  . A/V FISTULAGRAM Right 11/13/2019   Procedure: A/V FISTULAGRAM;  Surgeon: SKatha Cabal MD;  [facility];  Service: Cardiovascular;  Laterality: Right;  . AMPUTATION Left 01/09/2020   Procedure: AMPUTATION RAY ( GREAT TOE );  Surgeon: SKatha Cabal MD;  Location: [facility];  Service: Vascular;  Laterality: Left;  . AV FISTULA PLACEMENT Right 09/13/2018   Procedure: INSERTION OF ARTERIOVENOUS (AV) GORE-TEX GRAFT ARM;  Surgeon: SKatha Cabal MD;  Location: [facility];  Service: Vascular;  Laterality: Right;  . CAPD INSERTION N/A 06/08/2017   Procedure: LAPAROSCOPIC INSERTION CONTINUOUS AMBULATORY PERITONEAL DIALYSIS  (CAPD) CATHETER;  Surgeon: SKatha Cabal MD;  Location: [facility];  Service: Vascular;  Laterality: N/A;  . CATARACT EXTRACTION, BILATERAL Bilateral   . DIALYSIS/PERMA CATHETER INSERTION N/A 03/18/2017   Procedure: DIALYSIS/PERMA CATHETER INSERTION;  Surgeon: SKatha Cabal  MD;  [facility];  Service: Cardiovascular;  Laterality: N/A;  . DIALYSIS/PERMA CATHETER REMOVAL N/A 10/20/2017   Procedure: DIALYSIS/PERMA CATHETER REMOVAL;  Surgeon: DAlgernon Huxley MD;  [facility];  Service: Cardiovascular;  Laterality: N/A;  . EYE SURGERY    . LOWER EXTREMITY ANGIOGRAPHY Left 09/11/2019   Procedure: LOWER EXTREMITY ANGIOGRAPHY;  Surgeon: SKatha Cabal MD;  [facility];  Service: Cardiovascular;  Laterality: Left;  . LOWER EXTREMITY ANGIOGRAPHY Right 10/17/2019   Procedure: LOWER EXTREMITY ANGIOGRAPHY;  Surgeon: SKatha Cabal MD;  [facility];  Service: Cardiovascular;  Laterality: Right;  . LOWER EXTREMITY ANGIOGRAPHY Left 11/20/2019   Procedure: LOWER EXTREMITY ANGIOGRAPHY;  Surgeon: SKatha Cabal MD;  [facility];  Service: Cardiovascular;  Laterality: Left;  . REMOVAL OF A DIALYSIS CATHETER N/A 02/23/2019   Procedure: REMOVAL OF A DIALYSIS CATHETER ( PD CATH);  Surgeon: SKatha Cabal MD;  Location: [facility];  Service: Vascular;  Laterality: N/A;      No Known Allergies  Current Outpatient Medications  Medication Sig Dispense Refill  . acetaminophen (TYLENOL 8 HOUR ARTHRITIS PAIN) 650 MG CR tablet Take 650 mg every 8 (eight) hours as needed by mouth for pain.    .Marland Kitchenallopurinol (ZYLOPRIM) 100 MG tablet TAKE 1 TABLET BY MOUTH EVERYDAY AT BEDTIME 90 tablet 3  . amiodarone (PACERONE) 200 MG tablet Take  1 tablet (200 mg total) by mouth every morning. Take 1 tablet (200 mg) by mouth once daily (Patient taking differently: Take 200 mg by mouth every morning. ) 90 tablet 3  . amLODipine (NORVASC) 5 MG tablet Take 1 tablet (5 mg total) by mouth daily. (Patient taking differently: Take 5 mg by mouth every morning. ) 90 tablet 2  . atorvastatin (LIPITOR) 20 MG tablet Take 1 tablet (20 mg total) by mouth every evening. 90 tablet 0  . blood glucose meter kit and supplies  KIT Please dispense either One Touch or Bayer Contour She must have one of these machines since she is on peritoneal dialysis. Use up to four times daily as directed. (FOR ICD-9 250.00, 250.01). 1 each 0  . ciprofloxacin (CIPRO) 250 MG tablet Take 250 mg by mouth 2 (two) times daily.    . clopidogrel (PLAVIX) 75 MG tablet Take 1 tablet (75 mg total) by mouth daily. 30 tablet 4  . colchicine 0.6 MG tablet Take 0.6 mg by mouth daily as needed (gout).    [name] ELIQUIS 2.5 MG TABS tablet TAKE 1 TABLET BY MOUTH TWICE A DAY (Patient taking differently: Take 2.5 mg by mouth 2 (two) times daily. ) 180 tablet 1  . Ferric Citrate (AURYXIA PO) Take 220 mg by mouth 3 (three) times daily.    [name] HYDROcodone-acetaminophen (NORCO) 5-325 MG tablet Take 1-2 tablets by mouth every 6 (six) hours as needed for moderate pain or severe pain. 40 tablet 0  . metoprolol tartrate (LOPRESSOR) 25 MG tablet Take 0.5 tablets (12.5 mg total) by mouth 2 (two) times daily. 90 tablet 2   No current facility-administered medications for this visit.        Physical Exam There were no vitals taken for this visit. Gen:  WD/WN, NAD Skin: incision C/D/I AV Graft: moderate pulsatility      Assessment/Plan: 1. Atherosclerosis of native arteries of the extremities with ulceration ([place]) Sutures removed  Wound looks good  2. Complication from renal dialysis device, sequela Recommend:  The patient is experiencing increasing problems with their dialysis access.  Patient should have a duplex ultrasound of her access.    - VAS US DUPLEX DIALYSIS ACCESS (AVF, AVG); Future      Hortencia Pilar 02/03/2020, 11:40 AM   This note was created with Dragon medical transcription system.  Any errors from dictation are unintentional.

## 2020-02-04 ENCOUNTER — Encounter (INDEPENDENT_AMBULATORY_CARE_PROVIDER_SITE_OTHER): Payer: Self-pay | Admitting: Vascular Surgery

## 2020-02-04 ENCOUNTER — Other Ambulatory Visit: Payer: Self-pay

## 2020-02-04 ENCOUNTER — Ambulatory Visit (INDEPENDENT_AMBULATORY_CARE_PROVIDER_SITE_OTHER): Payer: Medicare Other | Admitting: Vascular Surgery

## 2020-02-04 VITALS — BP 200/73 | HR 68 | Resp 16 | Wt 154.0 lb

## 2020-02-04 DIAGNOSIS — I7025 Atherosclerosis of native arteries of other extremities with ulceration: Secondary | ICD-10-CM

## 2020-02-04 DIAGNOSIS — T829XXS Unspecified complication of cardiac and vascular prosthetic device, implant and graft, sequela: Secondary | ICD-10-CM

## 2020-02-05 ENCOUNTER — Encounter (INDEPENDENT_AMBULATORY_CARE_PROVIDER_SITE_OTHER): Payer: Self-pay | Admitting: Vascular Surgery

## 2020-02-05 DIAGNOSIS — Z992 Dependence on renal dialysis: Secondary | ICD-10-CM | POA: Diagnosis not present

## 2020-02-05 DIAGNOSIS — N186 End stage renal disease: Secondary | ICD-10-CM | POA: Diagnosis not present

## 2020-02-05 DIAGNOSIS — D509 Iron deficiency anemia, unspecified: Secondary | ICD-10-CM | POA: Diagnosis not present

## 2020-02-05 DIAGNOSIS — N2581 Secondary hyperparathyroidism of renal origin: Secondary | ICD-10-CM | POA: Diagnosis not present

## 2020-02-05 DIAGNOSIS — E119 Type 2 diabetes mellitus without complications: Secondary | ICD-10-CM | POA: Diagnosis not present

## 2020-02-05 DIAGNOSIS — D631 Anemia in chronic kidney disease: Secondary | ICD-10-CM | POA: Diagnosis not present

## 2020-02-06 DIAGNOSIS — N186 End stage renal disease: Secondary | ICD-10-CM | POA: Diagnosis not present

## 2020-02-06 DIAGNOSIS — I5032 Chronic diastolic (congestive) heart failure: Secondary | ICD-10-CM | POA: Diagnosis not present

## 2020-02-06 DIAGNOSIS — I132 Hypertensive heart and chronic kidney disease with heart failure and with stage 5 chronic kidney disease, or end stage renal disease: Secondary | ICD-10-CM | POA: Diagnosis not present

## 2020-02-06 DIAGNOSIS — Z4781 Encounter for orthopedic aftercare following surgical amputation: Secondary | ICD-10-CM | POA: Diagnosis not present

## 2020-02-06 DIAGNOSIS — I70202 Unspecified atherosclerosis of native arteries of extremities, left leg: Secondary | ICD-10-CM | POA: Diagnosis not present

## 2020-02-06 DIAGNOSIS — E1122 Type 2 diabetes mellitus with diabetic chronic kidney disease: Secondary | ICD-10-CM | POA: Diagnosis not present

## 2020-02-07 DIAGNOSIS — N186 End stage renal disease: Secondary | ICD-10-CM | POA: Diagnosis not present

## 2020-02-07 DIAGNOSIS — Z4781 Encounter for orthopedic aftercare following surgical amputation: Secondary | ICD-10-CM | POA: Diagnosis not present

## 2020-02-07 DIAGNOSIS — I5032 Chronic diastolic (congestive) heart failure: Secondary | ICD-10-CM | POA: Diagnosis not present

## 2020-02-07 DIAGNOSIS — I70202 Unspecified atherosclerosis of native arteries of extremities, left leg: Secondary | ICD-10-CM | POA: Diagnosis not present

## 2020-02-07 DIAGNOSIS — E1122 Type 2 diabetes mellitus with diabetic chronic kidney disease: Secondary | ICD-10-CM | POA: Diagnosis not present

## 2020-02-07 DIAGNOSIS — I132 Hypertensive heart and chronic kidney disease with heart failure and with stage 5 chronic kidney disease, or end stage renal disease: Secondary | ICD-10-CM | POA: Diagnosis not present

## 2020-02-09 DIAGNOSIS — Z992 Dependence on renal dialysis: Secondary | ICD-10-CM | POA: Diagnosis not present

## 2020-02-09 DIAGNOSIS — N186 End stage renal disease: Secondary | ICD-10-CM | POA: Diagnosis not present

## 2020-02-09 DIAGNOSIS — D509 Iron deficiency anemia, unspecified: Secondary | ICD-10-CM | POA: Diagnosis not present

## 2020-02-09 DIAGNOSIS — D631 Anemia in chronic kidney disease: Secondary | ICD-10-CM | POA: Diagnosis not present

## 2020-02-09 DIAGNOSIS — N2581 Secondary hyperparathyroidism of renal origin: Secondary | ICD-10-CM | POA: Diagnosis not present

## 2020-02-11 ENCOUNTER — Other Ambulatory Visit (INDEPENDENT_AMBULATORY_CARE_PROVIDER_SITE_OTHER): Payer: Self-pay | Admitting: Vascular Surgery

## 2020-02-11 DIAGNOSIS — I132 Hypertensive heart and chronic kidney disease with heart failure and with stage 5 chronic kidney disease, or end stage renal disease: Secondary | ICD-10-CM | POA: Diagnosis not present

## 2020-02-11 DIAGNOSIS — N186 End stage renal disease: Secondary | ICD-10-CM | POA: Diagnosis not present

## 2020-02-11 DIAGNOSIS — I5032 Chronic diastolic (congestive) heart failure: Secondary | ICD-10-CM | POA: Diagnosis not present

## 2020-02-11 DIAGNOSIS — I70202 Unspecified atherosclerosis of native arteries of extremities, left leg: Secondary | ICD-10-CM | POA: Diagnosis not present

## 2020-02-11 DIAGNOSIS — Z4781 Encounter for orthopedic aftercare following surgical amputation: Secondary | ICD-10-CM | POA: Diagnosis not present

## 2020-02-11 DIAGNOSIS — E1122 Type 2 diabetes mellitus with diabetic chronic kidney disease: Secondary | ICD-10-CM | POA: Diagnosis not present

## 2020-02-12 ENCOUNTER — Other Ambulatory Visit: Payer: Self-pay | Admitting: Physician Assistant

## 2020-02-12 DIAGNOSIS — N2581 Secondary hyperparathyroidism of renal origin: Secondary | ICD-10-CM | POA: Diagnosis not present

## 2020-02-12 DIAGNOSIS — D509 Iron deficiency anemia, unspecified: Secondary | ICD-10-CM | POA: Diagnosis not present

## 2020-02-12 DIAGNOSIS — Z992 Dependence on renal dialysis: Secondary | ICD-10-CM | POA: Diagnosis not present

## 2020-02-12 DIAGNOSIS — N186 End stage renal disease: Secondary | ICD-10-CM | POA: Diagnosis not present

## 2020-02-12 DIAGNOSIS — D631 Anemia in chronic kidney disease: Secondary | ICD-10-CM | POA: Diagnosis not present

## 2020-02-12 NOTE — Telephone Encounter (Signed)
Patient needs an appointment for further refills, Thanks !

## 2020-02-12 NOTE — Telephone Encounter (Signed)
LVm for pt to call back

## 2020-02-14 DIAGNOSIS — N186 End stage renal disease: Secondary | ICD-10-CM | POA: Diagnosis not present

## 2020-02-14 DIAGNOSIS — D631 Anemia in chronic kidney disease: Secondary | ICD-10-CM | POA: Diagnosis not present

## 2020-02-14 DIAGNOSIS — N2581 Secondary hyperparathyroidism of renal origin: Secondary | ICD-10-CM | POA: Diagnosis not present

## 2020-02-14 DIAGNOSIS — D509 Iron deficiency anemia, unspecified: Secondary | ICD-10-CM | POA: Diagnosis not present

## 2020-02-14 DIAGNOSIS — Z992 Dependence on renal dialysis: Secondary | ICD-10-CM | POA: Diagnosis not present

## 2020-02-15 ENCOUNTER — Encounter (INDEPENDENT_AMBULATORY_CARE_PROVIDER_SITE_OTHER): Payer: Self-pay | Admitting: Vascular Surgery

## 2020-02-15 ENCOUNTER — Telehealth: Payer: Self-pay

## 2020-02-15 ENCOUNTER — Telehealth: Payer: Self-pay | Admitting: Family Medicine

## 2020-02-15 ENCOUNTER — Other Ambulatory Visit: Payer: Self-pay | Admitting: Family Medicine

## 2020-02-15 DIAGNOSIS — I7025 Atherosclerosis of native arteries of other extremities with ulceration: Secondary | ICD-10-CM

## 2020-02-15 DIAGNOSIS — R296 Repeated falls: Secondary | ICD-10-CM

## 2020-02-15 DIAGNOSIS — N186 End stage renal disease: Secondary | ICD-10-CM | POA: Diagnosis not present

## 2020-02-15 DIAGNOSIS — Z4781 Encounter for orthopedic aftercare following surgical amputation: Secondary | ICD-10-CM | POA: Diagnosis not present

## 2020-02-15 DIAGNOSIS — I70202 Unspecified atherosclerosis of native arteries of extremities, left leg: Secondary | ICD-10-CM | POA: Diagnosis not present

## 2020-02-15 DIAGNOSIS — I132 Hypertensive heart and chronic kidney disease with heart failure and with stage 5 chronic kidney disease, or end stage renal disease: Secondary | ICD-10-CM | POA: Diagnosis not present

## 2020-02-15 DIAGNOSIS — E1122 Type 2 diabetes mellitus with diabetic chronic kidney disease: Secondary | ICD-10-CM | POA: Diagnosis not present

## 2020-02-15 DIAGNOSIS — I5032 Chronic diastolic (congestive) heart failure: Secondary | ICD-10-CM | POA: Diagnosis not present

## 2020-02-15 DIAGNOSIS — L97523 Non-pressure chronic ulcer of other part of left foot with necrosis of muscle: Secondary | ICD-10-CM

## 2020-02-15 NOTE — Telephone Encounter (Signed)
Noted  

## 2020-02-15 NOTE — Telephone Encounter (Signed)
Order faxed to fax number provided

## 2020-02-15 NOTE — Telephone Encounter (Signed)
Please call patient's daughter and get clarification for what letter is for? Why is it needed for so many members of her family?

## 2020-02-15 NOTE — Telephone Encounter (Signed)
I received a phone call from [place] with [employer]. He states he is needing an order for a rolling walker for balance faxed over for the patient. This can be faxed to [phone]

## 2020-02-15 NOTE — Telephone Encounter (Signed)
Order signed and placed on your desk. Please fax.

## 2020-02-15 NOTE — Telephone Encounter (Signed)
Debbie, please advise.

## 2020-02-15 NOTE — Telephone Encounter (Signed)
Patient's daughter, [name], called.  [name] needs a letter for her employer and school.  [name] said the letter needs to have patient's diagnosis and that she's being taken care of Robesha, Junior Taylor,Robesha's husband, and Trish Mage, Robesha's daughter. Please call Robesha with any questions or when letter is ready.

## 2020-02-15 NOTE — Telephone Encounter (Signed)
I spoke to patient.  She wants Debbie to write a note for dates she missed for work while taking care of her mother. I told patient it sounded like FMLA. She said she spoke to work and they said they could take a letter. She said she's been having to take her mother to dialysis. Her husband, also, helped. Now her daughter is taking patient to dialysis.  I asked patient to send a message to Jackelyn Poling on my chart with the dates and more detail and if Jackelyn Poling has any questions, she can reply back to her through my chart.

## 2020-02-16 DIAGNOSIS — N2581 Secondary hyperparathyroidism of renal origin: Secondary | ICD-10-CM | POA: Diagnosis not present

## 2020-02-16 DIAGNOSIS — Z992 Dependence on renal dialysis: Secondary | ICD-10-CM | POA: Diagnosis not present

## 2020-02-16 DIAGNOSIS — D631 Anemia in chronic kidney disease: Secondary | ICD-10-CM | POA: Diagnosis not present

## 2020-02-16 DIAGNOSIS — N186 End stage renal disease: Secondary | ICD-10-CM | POA: Diagnosis not present

## 2020-02-16 DIAGNOSIS — D509 Iron deficiency anemia, unspecified: Secondary | ICD-10-CM | POA: Diagnosis not present

## 2020-02-17 DIAGNOSIS — Z4781 Encounter for orthopedic aftercare following surgical amputation: Secondary | ICD-10-CM | POA: Diagnosis not present

## 2020-02-18 ENCOUNTER — Telehealth: Payer: Self-pay

## 2020-02-18 ENCOUNTER — Other Ambulatory Visit: Payer: Self-pay | Admitting: Family Medicine

## 2020-02-18 ENCOUNTER — Encounter (INDEPENDENT_AMBULATORY_CARE_PROVIDER_SITE_OTHER): Payer: Medicare Other

## 2020-02-18 ENCOUNTER — Ambulatory Visit (INDEPENDENT_AMBULATORY_CARE_PROVIDER_SITE_OTHER): Payer: Medicare Other | Admitting: Vascular Surgery

## 2020-02-18 NOTE — Telephone Encounter (Signed)
Refill sent to patients pharmacy. 

## 2020-02-18 NOTE — Telephone Encounter (Signed)
[place] Night - Client Nonclinical Telephone Record  [facility] Client [place] Night - Client Client Site [place] Physician Tor Netters- NP Contact Type Call Who Is Calling Patient / Member / Family / Caregiver Caller Name [place] Phone Number [phone] Patient Name Stacy Pham Patient DOB [birth year] Call Type Message Only Information Provided Reason for Call Request for General Office Information Initial Comment Caller states that mother is almost out of Amlodipine for BP. Needs new Rx sent to [facility] on [address] in [place]. Time Disposition Final User 02/16/2020 4:31:55 PM General Information Provided Yes Lawernce Keas Call Closed By: Lawernce Keas Transaction Date/Time: 02/16/2020 4:26:22 PM (ET)

## 2020-02-19 DIAGNOSIS — D509 Iron deficiency anemia, unspecified: Secondary | ICD-10-CM | POA: Diagnosis not present

## 2020-02-19 DIAGNOSIS — D631 Anemia in chronic kidney disease: Secondary | ICD-10-CM | POA: Diagnosis not present

## 2020-02-19 DIAGNOSIS — N2581 Secondary hyperparathyroidism of renal origin: Secondary | ICD-10-CM | POA: Diagnosis not present

## 2020-02-19 DIAGNOSIS — N186 End stage renal disease: Secondary | ICD-10-CM | POA: Diagnosis not present

## 2020-02-19 DIAGNOSIS — Z992 Dependence on renal dialysis: Secondary | ICD-10-CM | POA: Diagnosis not present

## 2020-02-20 ENCOUNTER — Ambulatory Visit (INDEPENDENT_AMBULATORY_CARE_PROVIDER_SITE_OTHER): Payer: Medicare Other

## 2020-02-20 ENCOUNTER — Ambulatory Visit (INDEPENDENT_AMBULATORY_CARE_PROVIDER_SITE_OTHER): Payer: Medicare Other | Admitting: Nurse Practitioner

## 2020-02-20 ENCOUNTER — Encounter (INDEPENDENT_AMBULATORY_CARE_PROVIDER_SITE_OTHER): Payer: Self-pay | Admitting: Nurse Practitioner

## 2020-02-20 ENCOUNTER — Other Ambulatory Visit: Payer: Self-pay

## 2020-02-20 VITALS — BP 158/60 | HR 58 | Resp 16 | Wt 155.0 lb

## 2020-02-20 DIAGNOSIS — N186 End stage renal disease: Secondary | ICD-10-CM

## 2020-02-20 DIAGNOSIS — E1159 Type 2 diabetes mellitus with other circulatory complications: Secondary | ICD-10-CM

## 2020-02-20 DIAGNOSIS — T829XXS Unspecified complication of cardiac and vascular prosthetic device, implant and graft, sequela: Secondary | ICD-10-CM

## 2020-02-20 DIAGNOSIS — I7025 Atherosclerosis of native arteries of other extremities with ulceration: Secondary | ICD-10-CM

## 2020-02-20 DIAGNOSIS — I10 Essential (primary) hypertension: Secondary | ICD-10-CM

## 2020-02-20 MED ORDER — HYDROCODONE-ACETAMINOPHEN 5-325 MG PO TABS: 1.0000 | ORAL_TABLET | Freq: Four times a day (QID) | ORAL | 0 refills | Status: DC | PRN

## 2020-02-20 NOTE — Progress Notes (Signed)
Subjective:    Patient ID: Stacy Pham, female    DOB: [birth year], 86 y.o.   MRN: [id number] Chief Complaint  Patient presents with  . Follow-up    ultrasound follow up    The patient returns today for evaluation of her dialysis access.  The patient notes that she has been having bleeding from her dialysis access following cannulation.  The patient does note that the bleeding only happens when certain tech stick in certain very low areas of her graft.  Usually when the 6 or higher she does not have any issues.  The bleeding is not consistent and again only within this predictable pattern.  The patient notes that there has been some infiltrations however those have resolved at this time.  The patient has pain and swelling under the skin following the use infiltrations.  And again these only happen when the patient is stuck to low on her graft.  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  The wound is completely healed at area however she still continues to have pain mostly during the evening.  Today noninvasive studies show flow volume of 987.  This is improved from her previous study on 11/07/2019 where there was a flow volume of 748.  There is no area of significant stenosis seen within her graft.  Overall the graft is open and patent.   Review of Systems  Musculoskeletal: Positive for gait problem.  Neurological: Positive for weakness.  Hematological: Bruises/bleeds easily.  All other systems reviewed and are negative.      Objective:   Physical Exam Vitals reviewed.  HENT:     Head: Normocephalic.  Cardiovascular:     Rate and Rhythm: Normal rate and regular rhythm.     Pulses:          Radial pulses are 1+ on the right side.     Arteriovenous access: right arteriovenous access is present.    Comments: Good thrill and bruit of right brachial axillary graft Pulmonary:     Effort: Pulmonary effort is normal.  Feet:     Comments: Left great toe  amputation Skin:    General: Skin is warm and dry.  Neurological:     Mental Status: She is alert and oriented to person, place, and time.  Psychiatric:        Mood and Affect: Mood normal.        Behavior: Behavior normal.        Thought Content: Thought content normal.        Judgment: Judgment normal.     BP (!) 158/60 (BP Location: Left Arm)   Pulse (!) 58   Resp 16   Wt 155 lb (70.3 kg)   BMI 27.46 kg/m   Past Medical History:  Diagnosis Date  . (HFpEF) heart failure with preserved ejection fraction ([place])    a. 10/2017 Echo: EF 60-65%, Gr1 DD, mild MR, mildly dil LA/RA. PASP 41mHg.  . Arthritis   . Back pain   . Bronchitis   . Cataract   . Diabetes mellitus without complication (HCC)    no meds currently  . Dialysis patient ([place] [place]   . Dysrhythmia   . ESRD (end stage renal disease) ([place]    a. 2018 - initially on HD but then transitioned to nightly PD in 08/2017.  .Marland KitchenGout   . History of methicillin resistant staphylococcus aureus (MRSA)   . Hypertension   . PAF (paroxysmal atrial fibrillation) (  [place])    a. CHA2DS2VASc = 5-->eliquis 2.5 BID.    Social History   Socioeconomic History  . Marital status: Single    Spouse name: Not on file  . Number of children: 3  . Years of education: Not on file  . Highest education level: Not on file  Occupational History  . Not on file  Tobacco Use  . Smoking status: Never Smoker  . Smokeless tobacco: Never Used  Vaping Use  . Vaping Use: Never used  Substance and Sexual Activity  . Alcohol use: No  . Drug use: No  . Sexual activity: Not on file  Other Topics Concern  . Not on file  Social History Narrative   Retired. Lives in [place] with her dtr who takes care of her.  Pt uses a walker to get around, though admits that ambulation over all is limited.   Social Determinants of Health   Financial Resource Strain: Low Risk   . Difficulty of Paying Living Expenses: Not hard at all  Food Insecurity: No Food  Insecurity  . Worried About Charity fundraiser in the Last Year: Never true  . Ran Out of Food in the Last Year: Never true  Transportation Needs: No Transportation Needs  . Lack of Transportation (Medical): No  . Lack of Transportation (Non-Medical): No  Physical Activity: Inactive  . Days of Exercise per Week: 0 days  . Minutes of Exercise per Session: 0 min  Stress: No Stress Concern Present  . Feeling of Stress : Not at all  Social Connections:   . Frequency of Communication with Friends and Family:   . Frequency of Social Gatherings with Friends and Family:   . Attends Religious Services:   . Active Member of Clubs or Organizations:   . Attends Archivist Meetings:   [name] Marital Status:   Intimate Partner Violence: Not At Risk  . Fear of Current or Ex-Partner: No  . Emotionally Abused: No  . Physically Abused: No  . Sexually Abused: No    Past Surgical History:  Procedure Laterality Date  . A/V FISTULAGRAM Right 11/13/2019   Procedure: A/V FISTULAGRAM;  Surgeon: Katha Cabal, MD;  Location: [facility];  Service: Cardiovascular;  Laterality: Right;  . AMPUTATION Left 01/09/2020   Procedure: AMPUTATION RAY ( GREAT TOE );  Surgeon: Katha Cabal, MD;  Location: [facility];  Service: Vascular;  Laterality: Left;  . AV FISTULA PLACEMENT Right 09/13/2018   Procedure: INSERTION OF ARTERIOVENOUS (AV) GORE-TEX GRAFT ARM;  Surgeon: Katha Cabal, MD;  Location: [facility];  Service: Vascular;  Laterality: Right;  . CAPD INSERTION N/A 06/08/2017   Procedure: LAPAROSCOPIC INSERTION CONTINUOUS AMBULATORY PERITONEAL DIALYSIS  (CAPD) CATHETER;  Surgeon: Katha Cabal, MD;  Location: [facility];  Service: Vascular;  Laterality: N/A;  . CATARACT EXTRACTION, BILATERAL Bilateral   . DIALYSIS/PERMA CATHETER INSERTION N/A 03/18/2017   Procedure: DIALYSIS/PERMA CATHETER INSERTION;  Surgeon: Katha Cabal, MD;  Location: [facility];  Service:  Cardiovascular;  Laterality: N/A;  . DIALYSIS/PERMA CATHETER REMOVAL N/A 10/20/2017   Procedure: DIALYSIS/PERMA CATHETER REMOVAL;  Surgeon: Algernon Huxley, MD;  Location: [facility];  Service: Cardiovascular;  Laterality: N/A;  . EYE SURGERY    . LOWER EXTREMITY ANGIOGRAPHY Left 09/11/2019   Procedure: LOWER EXTREMITY ANGIOGRAPHY;  Surgeon: Katha Cabal, MD;  Location: [facility];  Service: Cardiovascular;  Laterality: Left;  . LOWER EXTREMITY ANGIOGRAPHY Right 10/17/2019  Procedure: LOWER EXTREMITY ANGIOGRAPHY;  Surgeon: Katha Cabal, MD;  Location: [facility];  Service: Cardiovascular;  Laterality: Right;  . LOWER EXTREMITY ANGIOGRAPHY Left 11/20/2019   Procedure: LOWER EXTREMITY ANGIOGRAPHY;  Surgeon: Katha Cabal, MD;  Location: [facility];  Service: Cardiovascular;  Laterality: Left;  . REMOVAL OF A DIALYSIS CATHETER N/A 02/23/2019   Procedure: REMOVAL OF A DIALYSIS CATHETER ( PD CATH);  Surgeon: Katha Cabal, MD;  Location: [facility];  Service: Vascular;  Laterality: N/A;    Family History  Problem Relation Age of Onset  . Hypertension Father     No Known Allergies     Assessment & Plan:   1. ESRD (end stage renal disease) ([place]) The bleeding that the patient has, seems to be related to the area where she is being stuck in addition to the technician.  The patient is instructed to ensure that she is being stuck in the proper areas to avoid bleeding.  However, if the patient bleeds no matter where she is stuck and this becomes a consistent thing, she should contact her office for follow-up reevaluation.  Otherwise we will see the patient in 3 months with an HDA   2. Atherosclerosis of native arteries of the extremities with ulceration ([place]) Previous left great toe amputation looks well.  The wound is fully healed.  We will have the patient follow-up with noninvasive studies in 3 months.  3. Type 2 diabetes mellitus with vascular  disease ([place]) Continue hypoglycemic medications as already ordered, these medications have been reviewed and there are no changes at this time.  Hgb A1C to be monitored as already arranged by primary service   4. Essential hypertension Continue antihypertensive medications as already ordered, these medications have been reviewed and there are no changes at this time.    Current Outpatient Medications on File Prior to Visit  Medication Sig Dispense Refill  . acetaminophen (TYLENOL 8 HOUR ARTHRITIS PAIN) 650 MG CR tablet Take 650 mg every 8 (eight) hours as needed by mouth for pain.    [name] allopurinol (ZYLOPRIM) 100 MG tablet TAKE 1 TABLET BY MOUTH EVERYDAY AT BEDTIME 90 tablet 3  . amiodarone (PACERONE) 200 MG tablet Take 1 tablet (200 mg total) by mouth every morning. Take 1 tablet (200 mg) by mouth once daily (Patient taking differently: Take 200 mg by mouth every morning. ) 90 tablet 3  . amLODipine (NORVASC) 5 MG tablet Take 1 tablet (5 mg total) by mouth daily. 90 tablet 0  . atorvastatin (LIPITOR) 20 MG tablet Take 1 tablet (20 mg total) by mouth every evening. 90 tablet 0  . blood glucose meter kit and supplies KIT Please dispense either One Touch or Bayer Contour She must have one of these machines since she is on peritoneal dialysis. Use up to four times daily as directed. (FOR ICD-9 250.00, 250.01). 1 each 0  . ciprofloxacin (CIPRO) 250 MG tablet Take 250 mg by mouth 2 (two) times daily.    . clopidogrel (PLAVIX) 75 MG tablet TAKE 1 TABLET BY MOUTH EVERY DAY 30 tablet 4  . colchicine 0.6 MG tablet Take 0.6 mg by mouth daily as needed (gout).    [name] ELIQUIS 2.5 MG TABS tablet TAKE 1 TABLET BY MOUTH TWICE A DAY (Patient taking differently: Take 2.5 mg by mouth 2 (two) times daily. ) 180 tablet 1  . Ferric Citrate (AURYXIA PO) Take 220 mg by mouth 3 (three) times daily.    [name]  metoprolol tartrate (LOPRESSOR) 25 MG tablet Take 0.5 tablets (12.5 mg total) by mouth 2 (two) times daily. 90 tablet  2   No current facility-administered medications on file prior to visit.    There are no Patient Instructions on file for this visit. No follow-ups on file.   Kris Hartmann, NP

## 2020-02-21 DIAGNOSIS — N2581 Secondary hyperparathyroidism of renal origin: Secondary | ICD-10-CM | POA: Diagnosis not present

## 2020-02-21 DIAGNOSIS — N186 End stage renal disease: Secondary | ICD-10-CM | POA: Diagnosis not present

## 2020-02-21 DIAGNOSIS — D631 Anemia in chronic kidney disease: Secondary | ICD-10-CM | POA: Diagnosis not present

## 2020-02-21 DIAGNOSIS — Z992 Dependence on renal dialysis: Secondary | ICD-10-CM | POA: Diagnosis not present

## 2020-02-21 DIAGNOSIS — D509 Iron deficiency anemia, unspecified: Secondary | ICD-10-CM | POA: Diagnosis not present

## 2020-02-23 DIAGNOSIS — N186 End stage renal disease: Secondary | ICD-10-CM | POA: Diagnosis not present

## 2020-02-23 DIAGNOSIS — Z992 Dependence on renal dialysis: Secondary | ICD-10-CM | POA: Diagnosis not present

## 2020-02-23 DIAGNOSIS — D631 Anemia in chronic kidney disease: Secondary | ICD-10-CM | POA: Diagnosis not present

## 2020-02-23 DIAGNOSIS — D509 Iron deficiency anemia, unspecified: Secondary | ICD-10-CM | POA: Diagnosis not present

## 2020-02-23 DIAGNOSIS — N2581 Secondary hyperparathyroidism of renal origin: Secondary | ICD-10-CM | POA: Diagnosis not present

## 2020-02-26 DIAGNOSIS — D509 Iron deficiency anemia, unspecified: Secondary | ICD-10-CM | POA: Diagnosis not present

## 2020-02-26 DIAGNOSIS — Z992 Dependence on renal dialysis: Secondary | ICD-10-CM | POA: Diagnosis not present

## 2020-02-26 DIAGNOSIS — N186 End stage renal disease: Secondary | ICD-10-CM | POA: Diagnosis not present

## 2020-02-26 DIAGNOSIS — N2581 Secondary hyperparathyroidism of renal origin: Secondary | ICD-10-CM | POA: Diagnosis not present

## 2020-02-26 DIAGNOSIS — D631 Anemia in chronic kidney disease: Secondary | ICD-10-CM | POA: Diagnosis not present

## 2020-02-27 NOTE — Telephone Encounter (Signed)
Scheduled

## 2020-02-28 DIAGNOSIS — N2581 Secondary hyperparathyroidism of renal origin: Secondary | ICD-10-CM | POA: Diagnosis not present

## 2020-02-28 DIAGNOSIS — Z992 Dependence on renal dialysis: Secondary | ICD-10-CM | POA: Diagnosis not present

## 2020-02-28 DIAGNOSIS — D509 Iron deficiency anemia, unspecified: Secondary | ICD-10-CM | POA: Diagnosis not present

## 2020-02-28 DIAGNOSIS — N186 End stage renal disease: Secondary | ICD-10-CM | POA: Diagnosis not present

## 2020-02-28 DIAGNOSIS — D631 Anemia in chronic kidney disease: Secondary | ICD-10-CM | POA: Diagnosis not present

## 2020-03-01 DIAGNOSIS — D631 Anemia in chronic kidney disease: Secondary | ICD-10-CM | POA: Diagnosis not present

## 2020-03-01 DIAGNOSIS — D509 Iron deficiency anemia, unspecified: Secondary | ICD-10-CM | POA: Diagnosis not present

## 2020-03-01 DIAGNOSIS — N2581 Secondary hyperparathyroidism of renal origin: Secondary | ICD-10-CM | POA: Diagnosis not present

## 2020-03-01 DIAGNOSIS — N186 End stage renal disease: Secondary | ICD-10-CM | POA: Diagnosis not present

## 2020-03-01 DIAGNOSIS — Z992 Dependence on renal dialysis: Secondary | ICD-10-CM | POA: Diagnosis not present

## 2020-03-04 DIAGNOSIS — N186 End stage renal disease: Secondary | ICD-10-CM | POA: Diagnosis not present

## 2020-03-04 DIAGNOSIS — N2581 Secondary hyperparathyroidism of renal origin: Secondary | ICD-10-CM | POA: Diagnosis not present

## 2020-03-04 DIAGNOSIS — D509 Iron deficiency anemia, unspecified: Secondary | ICD-10-CM | POA: Diagnosis not present

## 2020-03-04 DIAGNOSIS — Z992 Dependence on renal dialysis: Secondary | ICD-10-CM | POA: Diagnosis not present

## 2020-03-04 DIAGNOSIS — D631 Anemia in chronic kidney disease: Secondary | ICD-10-CM | POA: Diagnosis not present

## 2020-03-06 ENCOUNTER — Telehealth (INDEPENDENT_AMBULATORY_CARE_PROVIDER_SITE_OTHER): Payer: Self-pay

## 2020-03-06 DIAGNOSIS — D509 Iron deficiency anemia, unspecified: Secondary | ICD-10-CM | POA: Diagnosis not present

## 2020-03-06 DIAGNOSIS — Z992 Dependence on renal dialysis: Secondary | ICD-10-CM | POA: Diagnosis not present

## 2020-03-06 DIAGNOSIS — D631 Anemia in chronic kidney disease: Secondary | ICD-10-CM | POA: Diagnosis not present

## 2020-03-06 DIAGNOSIS — N186 End stage renal disease: Secondary | ICD-10-CM | POA: Diagnosis not present

## 2020-03-06 DIAGNOSIS — N2581 Secondary hyperparathyroidism of renal origin: Secondary | ICD-10-CM | POA: Diagnosis not present

## 2020-03-06 NOTE — Telephone Encounter (Signed)
pts daughter called and said that  Her mothers  Right  Hand had swollen up due to  Being stuck incorrectly  Again, I made the NP aware and she said the pt needs to come in to be seen on Monday to seen Dr. Delana Meyer  And have HDA I made the daughter aware and transferred her to the appropriate person to schedule her for Monday.

## 2020-03-08 DIAGNOSIS — N186 End stage renal disease: Secondary | ICD-10-CM | POA: Diagnosis not present

## 2020-03-08 DIAGNOSIS — D509 Iron deficiency anemia, unspecified: Secondary | ICD-10-CM | POA: Diagnosis not present

## 2020-03-08 DIAGNOSIS — N2581 Secondary hyperparathyroidism of renal origin: Secondary | ICD-10-CM | POA: Diagnosis not present

## 2020-03-08 DIAGNOSIS — D631 Anemia in chronic kidney disease: Secondary | ICD-10-CM | POA: Diagnosis not present

## 2020-03-08 DIAGNOSIS — Z992 Dependence on renal dialysis: Secondary | ICD-10-CM | POA: Diagnosis not present

## 2020-03-10 ENCOUNTER — Telehealth (INDEPENDENT_AMBULATORY_CARE_PROVIDER_SITE_OTHER): Payer: Self-pay

## 2020-03-10 ENCOUNTER — Other Ambulatory Visit (INDEPENDENT_AMBULATORY_CARE_PROVIDER_SITE_OTHER): Payer: Self-pay | Admitting: Vascular Surgery

## 2020-03-10 ENCOUNTER — Encounter (INDEPENDENT_AMBULATORY_CARE_PROVIDER_SITE_OTHER): Payer: Self-pay | Admitting: Nurse Practitioner

## 2020-03-10 ENCOUNTER — Other Ambulatory Visit: Payer: Self-pay

## 2020-03-10 ENCOUNTER — Ambulatory Visit (INDEPENDENT_AMBULATORY_CARE_PROVIDER_SITE_OTHER): Payer: Medicare Other

## 2020-03-10 ENCOUNTER — Other Ambulatory Visit
Admission: RE | Admit: 2020-03-10 | Discharge: 2020-03-10 | Disposition: A | Discharge status: Home / Self Care | Payer: Medicare Other | Source: Ambulatory Visit | Attending: Vascular Surgery | Admitting: Vascular Surgery

## 2020-03-10 ENCOUNTER — Ambulatory Visit (INDEPENDENT_AMBULATORY_CARE_PROVIDER_SITE_OTHER): Payer: Medicare Other | Admitting: Nurse Practitioner

## 2020-03-10 ENCOUNTER — Encounter (INDEPENDENT_AMBULATORY_CARE_PROVIDER_SITE_OTHER): Payer: Self-pay

## 2020-03-10 VITALS — BP 164/81 | HR 64 | Resp 16 | Wt 158.6 lb

## 2020-03-10 DIAGNOSIS — Z20822 Contact with and (suspected) exposure to covid-19: Secondary | ICD-10-CM | POA: Insufficient documentation

## 2020-03-10 DIAGNOSIS — Z01812 Encounter for preprocedural laboratory examination: Secondary | ICD-10-CM | POA: Insufficient documentation

## 2020-03-10 DIAGNOSIS — I7025 Atherosclerosis of native arteries of other extremities with ulceration: Secondary | ICD-10-CM | POA: Diagnosis not present

## 2020-03-10 DIAGNOSIS — M7989 Other specified soft tissue disorders: Secondary | ICD-10-CM | POA: Diagnosis not present

## 2020-03-10 DIAGNOSIS — N186 End stage renal disease: Secondary | ICD-10-CM

## 2020-03-10 DIAGNOSIS — Z992 Dependence on renal dialysis: Secondary | ICD-10-CM

## 2020-03-10 DIAGNOSIS — I10 Essential (primary) hypertension: Secondary | ICD-10-CM

## 2020-03-10 DIAGNOSIS — E1122 Type 2 diabetes mellitus with diabetic chronic kidney disease: Secondary | ICD-10-CM

## 2020-03-10 LAB — SARS CORONAVIRUS 2 (TAT 6-24 HRS): SARS Coronavirus 2: NEGATIVE

## 2020-03-10 MED ORDER — HYDROCODONE-ACETAMINOPHEN 5-325 MG PO TABS: 1.0000 | ORAL_TABLET | Freq: Four times a day (QID) | ORAL | 0 refills | Status: DC | PRN

## 2020-03-10 MED ORDER — MUPIROCIN 2 % EX OINT: 1.0000 "application " | TOPICAL_OINTMENT | Freq: Every day | CUTANEOUS | 0 refills | Status: DC

## 2020-03-10 NOTE — Progress Notes (Signed)
Subjective:    Patient ID: Stacy Pham, female    DOB: 12-28-1930, 86 y.o.   MRN: [id number] Chief Complaint  Patient presents with  . Follow-up    ultrasound follow up    Patient presents today after noting that she had a dialysis session which subsequently bled and left her hand feeling painful and hard.  The patient dialyzed at her next section after that however she had extensive bruising.  The patient endorses that the area is swollen and tender.  She denies any fever, chills, nausea, vomiting or diarrhea.  The patient recently had a left great toe amputation which is doing well.  However she has lost the toenail on her fourth toe.  However the area is red and beefy showing no signs symptoms of infection or ischemia.  Today noninvasive studies show an occluded right brachial axillary AV graft.  No thrill is felt or bruit heard.  Hematoma is also seen in the distal upper arm measuring 3.39 cm x 1.30 cm.   Review of Systems  Cardiovascular: Positive for leg swelling.  Hematological: Bruises/bleeds easily.  All other systems reviewed and are negative.      Objective:   Physical Exam Vitals reviewed.  HENT:     Head: Normocephalic.  Cardiovascular:     Rate and Rhythm: Normal rate.     Pulses: Normal pulses.          Radial pulses are 2+ on the right side.     Arteriovenous access: right arteriovenous access is present.    Comments: No thrill or bruit in right brachial axillary graft.  Bruising around area Pulmonary:     Effort: Pulmonary effort is normal.  Skin:    General: Skin is warm and dry.  Neurological:     Mental Status: She is alert and oriented to person, place, and time.     Motor: Weakness present.     Gait: Gait abnormal.  Psychiatric:        Mood and Affect: Mood normal.        Behavior: Behavior normal.        Thought Content: Thought content normal.        Judgment: Judgment normal.     BP (!) 164/81 (BP Location: Left Arm)   Pulse 64   Resp 16    Wt 158 lb 9.6 oz (71.9 kg)   BMI 28.09 kg/m   Past Medical History:  Diagnosis Date  . (HFpEF) heart failure with preserved ejection fraction ([place])    a. 10/2017 Echo: EF 60-65%, Gr1 DD, mild MR, mildly dil LA/RA. PASP 75mHg.  . Arthritis   . Back pain   . Bronchitis   . Cataract   . Diabetes mellitus without complication (HCC)    no meds currently  . [place] [place]   . Dysrhythmia   . ESRD ([place] stage renal disease) ([place]    a. 2018 - initially on HD but then transitioned to nightly PD in 08/2017.  .Marland KitchenGout   . History of methicillin resistant staphylococcus aureus (MRSA)   . Hypertension   . PAF (paroxysmal atrial fibrillation) (HCC)    a. CHA2DS2VASc = 5-->eliquis 2.5 BID.    Social History   Socioeconomic History  . Marital status: Single    Spouse name: Not on file  . Number of children: 3  . Years of education: Not on file  . Highest education level: Not on file  Occupational History  . Not on file  Tobacco Use  . Smoking status: Never Smoker  . Smokeless tobacco: Never Used  Vaping Use  . Vaping Use: Never used  Substance and Sexual Activity  . Alcohol use: No  . Drug use: No  . Sexual activity: Not on file  Other Topics Concern  . Not on file  Social History Narrative   Retired. Lives in [place] with her dtr who takes care of her.  Pt uses a walker to get around, though admits that ambulation over all is limited.   Social Determinants of Health   Financial Resource Strain: Low Risk   . Difficulty of Paying Living Expenses: Not hard at all  Food Insecurity: No Food Insecurity  . Worried About Charity fundraiser in the Last Year: Never true  . Ran Out of Food in the Last Year: Never true  Transportation Needs: No Transportation Needs  . Lack of Transportation (Medical): No  . Lack of Transportation (Non-Medical): No  Physical Activity: Inactive  . Days of Exercise per Week: 0 days  . Minutes of Exercise per Session: 0 min  Stress: No  Stress Concern Present  . Feeling of Stress : Not at all  Social Connections:   . Frequency of Communication with Friends and Family: Not on file  . Frequency of Social Gatherings with Friends and Family: Not on file  . Attends Religious Services: Not on file  . Active Member of Clubs or Organizations: Not on file  . Attends Archivist Meetings: Not on file  . Marital Status: Not on file  Intimate Partner Violence: Not At Risk  . Fear of Current or Ex-Partner: No  . Emotionally Abused: No  . Physically Abused: No  . Sexually Abused: No    Past Surgical History:  Procedure Laterality Date  . A/V FISTULAGRAM Right 11/13/2019   Procedure: A/V FISTULAGRAM;  Surgeon: Katha Cabal, MD;  Location: [facility];  Service: Cardiovascular;  Laterality: Right;  . AMPUTATION Left 01/09/2020   Procedure: AMPUTATION RAY ( GREAT TOE );  Surgeon: Katha Cabal, MD;  Location: [facility];  Service: Vascular;  Laterality: Left;  . AV FISTULA PLACEMENT Right 09/13/2018   Procedure: INSERTION OF ARTERIOVENOUS (AV) GORE-TEX GRAFT ARM;  Surgeon: Katha Cabal, MD;  Location: [facility];  Service: Vascular;  Laterality: Right;  . CAPD INSERTION N/A 06/08/2017   Procedure: LAPAROSCOPIC INSERTION CONTINUOUS AMBULATORY PERITONEAL DIALYSIS  (CAPD) CATHETER;  Surgeon: Katha Cabal, MD;  Location: [facility];  Service: Vascular;  Laterality: N/A;  . CATARACT EXTRACTION, BILATERAL Bilateral   . DIALYSIS/PERMA CATHETER INSERTION N/A 03/18/2017   Procedure: DIALYSIS/PERMA CATHETER INSERTION;  Surgeon: Katha Cabal, MD;  Location: [facility];  Service: Cardiovascular;  Laterality: N/A;  . DIALYSIS/PERMA CATHETER REMOVAL N/A 10/20/2017   Procedure: DIALYSIS/PERMA CATHETER REMOVAL;  Surgeon: Algernon Huxley, MD;  Location: [facility];  Service: Cardiovascular;  Laterality: N/A;  . EYE SURGERY    . LOWER EXTREMITY ANGIOGRAPHY Left 09/11/2019   Procedure: LOWER EXTREMITY  ANGIOGRAPHY;  Surgeon: Katha Cabal, MD;  Location: [facility];  Service: Cardiovascular;  Laterality: Left;  . LOWER EXTREMITY ANGIOGRAPHY Right 10/17/2019   Procedure: LOWER EXTREMITY ANGIOGRAPHY;  Surgeon: Katha Cabal, MD;  Location: [facility];  Service: Cardiovascular;  Laterality: Right;  . LOWER EXTREMITY ANGIOGRAPHY Left 11/20/2019   Procedure: LOWER EXTREMITY ANGIOGRAPHY;  Surgeon: Katha Cabal, MD;  Location: [facility];  Service: Cardiovascular;  Laterality: Left;  . REMOVAL OF A DIALYSIS CATHETER N/A 02/23/2019   Procedure: REMOVAL OF A DIALYSIS CATHETER ( PD CATH);  Surgeon: Katha Cabal, MD;  Location: [facility];  Service: Vascular;  Laterality: N/A;    Family History  Problem Relation Age of Onset  . Hypertension Father     No Known Allergies     Assessment & Plan:   1. Atherosclerosis of native arteries of the extremities with ulceration ([place]) The treatments previous amputation on her left foot has healed well.  However, the patient's left fourth toe has lost the toenail.  The area does not show any signs symptoms of infection as of right now.  It did not look necrotic.  There is bleeding from the wound bed which is a good sign.  I had the patient placed mupirocin ointment on the area on a daily basis.  We will also send the patient with some pain medication due to discomfort. - HYDROcodone-acetaminophen (NORCO) 5-325 MG tablet; Take 1 tablet by mouth every 6 (six) hours as needed for moderate pain or severe pain.  Dispense: 40 tablet; Refill: 0 - mupirocin ointment (BACTROBAN) 2 %; Apply 1 application topically daily.  Dispense: 22 g; Refill: 0  2. Essential hypertension Continue antihypertensive medications as already ordered, these medications have been reviewed and there are no changes at this time.   3. Type 2 diabetes mellitus with chronic kidney disease on chronic dialysis, without long-term current use of insulin  (HCC) Continue hypoglycemic medications as already ordered, these medications have been reviewed and there are no changes at this time.  Hgb A1C to be monitored as already arranged by primary service   4. ESRD (end stage renal disease) ([place]) Recommend:  The patient is experiencing increasing problems with their dialysis access.  Patient should have a thrombectomy with the intention for intervention.  The intention for intervention is to restore appropriate flow and prevent thrombosis and possible loss of the access.  As well as improve the quality of dialysis therapy.  The risks, benefits and alternative therapies were reviewed in detail with the patient.  All questions were answered.  The patient agrees to proceed with angio/intervention.       Current Outpatient Medications on File Prior to Visit  Medication Sig Dispense Refill  . acetaminophen (TYLENOL 8 HOUR ARTHRITIS PAIN) 650 MG CR tablet Take 650 mg every 8 (eight) hours as needed by mouth for pain.    [name] allopurinol (ZYLOPRIM) 100 MG tablet TAKE 1 TABLET BY MOUTH EVERYDAY AT BEDTIME 90 tablet 3  . amiodarone (PACERONE) 200 MG tablet Take 1 tablet (200 mg total) by mouth every morning. Take 1 tablet (200 mg) by mouth once daily (Patient taking differently: Take 200 mg by mouth every morning. ) 90 tablet 3  . amLODipine (NORVASC) 5 MG tablet Take 1 tablet (5 mg total) by mouth daily. 90 tablet 0  . atorvastatin (LIPITOR) 20 MG tablet Take 1 tablet (20 mg total) by mouth every evening. 90 tablet 0  . blood glucose meter kit and supplies KIT Please dispense either One Touch or Bayer Contour She must have one of these machines since she is on peritoneal dialysis. Use up to four times daily as directed. (FOR ICD-9 250.00, 250.01). 1 each 0  . ciprofloxacin (CIPRO) 250 MG tablet Take 250 mg by mouth 2 (two) times daily.    . clopidogrel (PLAVIX) 75 MG tablet TAKE 1 TABLET BY MOUTH EVERY DAY 30 tablet 4  .  colchicine 0.6 MG tablet Take 0.6  mg by mouth daily as needed (gout).    [name] ELIQUIS 2.5 MG TABS tablet TAKE 1 TABLET BY MOUTH TWICE A DAY (Patient taking differently: Take 2.5 mg by mouth 2 (two) times daily. ) 180 tablet 1  . Ferric Citrate (AURYXIA PO) Take 220 mg by mouth 3 (three) times daily.    . metoprolol tartrate (LOPRESSOR) 25 MG tablet Take 0.5 tablets (12.5 mg total) by mouth 2 (two) times daily. 90 tablet 2   No current facility-administered medications on file prior to visit.    There are no Patient Instructions on file for this visit. No follow-ups on file.   Kris Hartmann, NP

## 2020-03-10 NOTE — Telephone Encounter (Signed)
Spoke with the patient and her granddaughter and she is scheduled with Dr. Delana Meyer for right arm thrombectomy on 03/11/20 with a 12:00 pm arrival time to the [facility]. Covid testing is today at the [facility] between 8-1 pm. Pre-procedure instructions were discussed and handed to the patient and granddaughter.

## 2020-03-11 ENCOUNTER — Other Ambulatory Visit (INDEPENDENT_AMBULATORY_CARE_PROVIDER_SITE_OTHER): Payer: Self-pay | Admitting: Vascular Surgery

## 2020-03-11 ENCOUNTER — Encounter: Payer: Self-pay | Admitting: Vascular Surgery

## 2020-03-11 ENCOUNTER — Other Ambulatory Visit: Payer: Self-pay

## 2020-03-11 ENCOUNTER — Telehealth (INDEPENDENT_AMBULATORY_CARE_PROVIDER_SITE_OTHER): Payer: Self-pay | Admitting: Vascular Surgery

## 2020-03-11 ENCOUNTER — Ambulatory Visit: Payer: Medicare Other

## 2020-03-11 ENCOUNTER — Encounter
Admission: AC | Disposition: A | Discharge status: Home / Self Care | Payer: Self-pay | Source: Home / Self Care | Attending: Internal Medicine

## 2020-03-11 ENCOUNTER — Observation Stay
Admission: AC | Admit: 2020-03-11 | Discharge: 2020-03-12 | Disposition: A | Discharge status: Home / Self Care | Payer: Medicare Other | Attending: Hospitalist | Admitting: Hospitalist

## 2020-03-11 DIAGNOSIS — D631 Anemia in chronic kidney disease: Secondary | ICD-10-CM | POA: Diagnosis not present

## 2020-03-11 DIAGNOSIS — I10 Essential (primary) hypertension: Secondary | ICD-10-CM | POA: Diagnosis not present

## 2020-03-11 DIAGNOSIS — N2581 Secondary hyperparathyroidism of renal origin: Secondary | ICD-10-CM | POA: Diagnosis not present

## 2020-03-11 DIAGNOSIS — N186 End stage renal disease: Secondary | ICD-10-CM | POA: Insufficient documentation

## 2020-03-11 DIAGNOSIS — Z7901 Long term (current) use of anticoagulants: Secondary | ICD-10-CM | POA: Diagnosis not present

## 2020-03-11 DIAGNOSIS — E875 Hyperkalemia: Secondary | ICD-10-CM | POA: Diagnosis present

## 2020-03-11 DIAGNOSIS — T82868A Thrombosis of vascular prosthetic devices, implants and grafts, initial encounter: Principal | ICD-10-CM | POA: Insufficient documentation

## 2020-03-11 DIAGNOSIS — Z992 Dependence on renal dialysis: Secondary | ICD-10-CM | POA: Diagnosis not present

## 2020-03-11 DIAGNOSIS — I120 Hypertensive chronic kidney disease with stage 5 chronic kidney disease or end stage renal disease: Secondary | ICD-10-CM | POA: Insufficient documentation

## 2020-03-11 DIAGNOSIS — Z79899 Other long term (current) drug therapy: Secondary | ICD-10-CM | POA: Diagnosis not present

## 2020-03-11 DIAGNOSIS — T82898A Other specified complication of vascular prosthetic devices, implants and grafts, initial encounter: Secondary | ICD-10-CM

## 2020-03-11 DIAGNOSIS — E119 Type 2 diabetes mellitus without complications: Secondary | ICD-10-CM | POA: Insufficient documentation

## 2020-03-11 DIAGNOSIS — J90 Pleural effusion, not elsewhere classified: Secondary | ICD-10-CM | POA: Diagnosis not present

## 2020-03-11 DIAGNOSIS — I517 Cardiomegaly: Secondary | ICD-10-CM | POA: Diagnosis not present

## 2020-03-11 HISTORY — PX: TEMPORARY DIALYSIS CATHETER: CATH118312

## 2020-03-11 LAB — CBC
HCT: 27.8 % — ABNORMAL LOW (ref 36.0–46.0)
Hemoglobin: 9 g/dL — ABNORMAL LOW (ref 12.0–15.0)
MCH: 31.8 pg (ref 26.0–34.0)
MCHC: 32.4 g/dL (ref 30.0–36.0)
MCV: 98.2 fL (ref 80.0–100.0)
Platelets: 300 10*3/uL (ref 150–400)
RBC: 2.83 MIL/uL — ABNORMAL LOW (ref 3.87–5.11)
RDW: 15.1 % (ref 11.5–15.5)
WBC: 7.6 10*3/uL (ref 4.0–10.5)
nRBC: 0 % (ref 0.0–0.2)

## 2020-03-11 LAB — GLUCOSE, CAPILLARY: Glucose-Capillary: 96 mg/dL (ref 70–99)

## 2020-03-11 LAB — TYPE AND SCREEN
ABO/RH(D): B POS
Antibody Screen: NEGATIVE

## 2020-03-11 LAB — BASIC METABOLIC PANEL
Anion gap: 12 (ref 5–15)
BUN: 64 mg/dL — ABNORMAL HIGH (ref 8–23)
CO2: 27 mmol/L (ref 22–32)
Calcium: 8.8 mg/dL — ABNORMAL LOW (ref 8.9–10.3)
Chloride: 98 mmol/L (ref 98–111)
Creatinine, Ser: 6.5 mg/dL — ABNORMAL HIGH (ref 0.44–1.00)
GFR calc Af Amer: 6 mL/min — ABNORMAL LOW (ref >60)
GFR calc non Af Amer: 5 mL/min — ABNORMAL LOW (ref >60)
Glucose, Bld: 95 mg/dL (ref 70–99)
Potassium: 4 mmol/L (ref 3.5–5.1)
Sodium: 137 mmol/L (ref 135–145)

## 2020-03-11 LAB — POTASSIUM: Potassium: 5.7 mmol/L — ABNORMAL HIGH (ref 3.5–5.1)

## 2020-03-11 LAB — MAGNESIUM: Magnesium: 2 mg/dL (ref 1.7–2.4)

## 2020-03-11 LAB — POTASSIUM (ARMC VASCULAR LAB ONLY): Potassium (ARMC vascular lab): 5.6 — ABNORMAL HIGH (ref 3.5–5.1)

## 2020-03-11 SURGERY — TEMPORARY DIALYSIS CATHETER
Anesthesia: LOCAL | Laterality: Right

## 2020-03-11 SURGERY — TEMPORARY DIALYSIS CATHETER
Anesthesia: LOCAL

## 2020-03-11 MED ORDER — ATORVASTATIN CALCIUM 20 MG PO TABS
20.0000 mg | ORAL_TABLET | Freq: Every day | ORAL | Status: DC
  Filled 2020-03-11: qty 1

## 2020-03-11 MED ORDER — ALLOPURINOL 100 MG PO TABS
100.0000 mg | ORAL_TABLET | Freq: Every day | ORAL | Status: DC
  Filled 2020-03-11: qty 1

## 2020-03-11 MED ORDER — METHYLPREDNISOLONE SODIUM SUCC 125 MG IJ SOLR: 125.0000 mg | Freq: Once | INTRAMUSCULAR | Status: DC | PRN

## 2020-03-11 MED ORDER — SODIUM CHLORIDE 0.9 % IV SOLN: INTRAVENOUS | Status: DC

## 2020-03-11 MED ORDER — ACETAMINOPHEN 650 MG RE SUPP
650.0000 mg | Freq: Four times a day (QID) | RECTAL | Status: DC | PRN
  Filled 2020-03-11: qty 1

## 2020-03-11 MED ORDER — HYDRALAZINE HCL 20 MG/ML IJ SOLN: 20.0000 mg | Freq: Four times a day (QID) | INTRAMUSCULAR | Status: DC | PRN

## 2020-03-11 MED ORDER — DIPHENHYDRAMINE HCL 50 MG/ML IJ SOLN: 50.0000 mg | Freq: Once | INTRAMUSCULAR | Status: DC | PRN

## 2020-03-11 MED ORDER — LIDOCAINE HCL (PF) 1 % IJ SOLN
INTRAMUSCULAR | Status: DC | PRN
  Administered 2020-03-11: 5 mL via INTRADERMAL

## 2020-03-11 MED ORDER — METOPROLOL TARTRATE 25 MG PO TABS
12.5000 mg | ORAL_TABLET | Freq: Two times a day (BID) | ORAL | Status: DC
  Administered 2020-03-11: 12.5 mg via ORAL
  Filled 2020-03-11: qty 1

## 2020-03-11 MED ORDER — CEFAZOLIN SODIUM-DEXTROSE 1-4 GM/50ML-% IV SOLN: 1.0000 g | Freq: Once | INTRAVENOUS | Status: DC

## 2020-03-11 MED ORDER — BISACODYL 5 MG PO TBEC
5.0000 mg | DELAYED_RELEASE_TABLET | Freq: Every day | ORAL | Status: DC | PRN
  Filled 2020-03-11: qty 1

## 2020-03-11 MED ORDER — ONDANSETRON HCL 4 MG/2ML IJ SOLN: 4.0000 mg | Freq: Four times a day (QID) | INTRAMUSCULAR | Status: DC | PRN

## 2020-03-11 MED ORDER — CEFAZOLIN SODIUM-DEXTROSE 1-4 GM/50ML-% IV SOLN
INTRAVENOUS | Status: AC
  Administered 2020-03-12: 1 g via INTRAVENOUS
  Filled 2020-03-11: qty 50

## 2020-03-11 MED ORDER — ONDANSETRON HCL 4 MG PO TABS
4.0000 mg | ORAL_TABLET | Freq: Four times a day (QID) | ORAL | Status: DC | PRN
  Filled 2020-03-11: qty 1

## 2020-03-11 MED ORDER — AMLODIPINE BESYLATE 5 MG PO TABS: 5.0000 mg | ORAL_TABLET | Freq: Every day | ORAL | Status: DC

## 2020-03-11 MED ORDER — HYDROMORPHONE HCL 1 MG/ML IJ SOLN: 1.0000 mg | Freq: Once | INTRAMUSCULAR | Status: DC | PRN

## 2020-03-11 MED ORDER — ACETAMINOPHEN 325 MG PO TABS
650.0000 mg | ORAL_TABLET | Freq: Four times a day (QID) | ORAL | Status: DC | PRN
  Administered 2020-03-11 – 2020-03-12 (×2): 650 mg via ORAL
  Filled 2020-03-11 (×2): qty 2

## 2020-03-11 MED ORDER — HEPARIN SODIUM (PORCINE) 5000 UNIT/ML IJ SOLN: 5000.0000 [IU] | Freq: Three times a day (TID) | INTRAMUSCULAR | Status: DC

## 2020-03-11 MED ORDER — AMIODARONE HCL 200 MG PO TABS
200.0000 mg | ORAL_TABLET | Freq: Every day | ORAL | Status: DC
  Filled 2020-03-11: qty 1

## 2020-03-11 MED ORDER — MIDAZOLAM HCL 2 MG/ML PO SYRP: 8.0000 mg | ORAL_SOLUTION | Freq: Once | ORAL | Status: DC | PRN

## 2020-03-11 MED ORDER — CHLORHEXIDINE GLUCONATE CLOTH 2 % EX PADS
6.0000 | MEDICATED_PAD | Freq: Every day | CUTANEOUS | Status: DC
  Administered 2020-03-12: 6 via TOPICAL

## 2020-03-11 MED ORDER — FAMOTIDINE 20 MG PO TABS: 40.0000 mg | ORAL_TABLET | Freq: Once | ORAL | Status: DC | PRN

## 2020-03-11 SURGICAL SUPPLY — 3 items
COVER PROBE U/S 5X48 (MISCELLANEOUS) ×3 IMPLANT
KIT DIALYSIS CATH TRI 30X13 (CATHETERS) ×3 IMPLANT
SUT SILK 0 FSL (SUTURE) ×3 IMPLANT

## 2020-03-11 NOTE — Progress Notes (Signed)
Hd started  

## 2020-03-11 NOTE — Progress Notes (Signed)
[facility]  ROUNDING NOTE   Subjective:   [name] was admitted to [facility] on 03/11/2020 for Hyperkalemia [E87.5]  Last hemodialysis treatment was 8/28. When she presented to dialysis today, her AVG was not able to be accessed. She was brought to the vascular suite for intervention where she was found to have hyperkalemia. It was decided to admit patient and perform hemodialysis treatment.   Patient has no complaints    Objective:  Vital signs in last 24 hours:  Temp:  [97.9 F (36.6 C)] 97.9 F (36.6 C) (08/31 1235) Pulse Rate:  [55-57] 57 (08/31 1459) Resp:  [15-20] 20 (08/31 1459) BP: (166-169)/(55-61) 169/55 (08/31 1459) SpO2:  [97 %-98 %] 98 % (08/31 1459) Weight:  [68 kg] 68 kg (08/31 1235)  Weight change:  Filed Weights   03/11/20 1235  Weight: 68 kg    Intake/Output: No intake/output data recorded.   Intake/Output this shift:  No intake/output data recorded.  Physical Exam: General: NAD,   Head: Normocephalic, atraumatic. Moist oral mucosal membranes  Eyes: Anicteric, PERRL  Neck: Supple, trachea midline  Lungs:  Clear to auscultation  Heart: Regular rate and rhythm  Abdomen:  Soft, nontender,   Extremities:  no peripheral edema.  Neurologic: Nonfocal, moving all four extremities  Skin: No lesions  Access: Right arm AVG no bruit no thrill    Basic Metabolic Panel: Recent Labs  Lab 03/11/20 1320  K 5.7*    Liver Function Tests: No results for input(s): AST, ALT, ALKPHOS, BILITOT, PROT, ALBUMIN in the last 168 hours. No results for input(s): LIPASE, AMYLASE in the last 168 hours. No results for input(s): AMMONIA in the last 168 hours.  CBC: No results for input(s): WBC, NEUTROABS, HGB, HCT, MCV, PLT in the last 168 hours.  Cardiac Enzymes: No results for input(s): CKTOTAL, CKMB, CKMBINDEX, TROPONINI in the last 168 hours.  BNP: Invalid input(s): POCBNP  CBG: Recent Labs  Lab 03/11/20 1250  GLUCAP 96     Microbiology: Results for orders placed or performed during the hospital encounter of 03/10/20  SARS CORONAVIRUS 2 (TAT 6-24 HRS) Nasopharyngeal Nasopharyngeal Swab     Status: None   Collection Time: 03/10/20 12:25 PM   Specimen: Nasopharyngeal Swab  Result Value Ref Range Status   SARS Coronavirus 2 NEGATIVE NEGATIVE Final    Comment: (NOTE) SARS-CoV-2 target nucleic acids are NOT DETECTED.  The SARS-CoV-2 RNA is generally detectable in upper and lower respiratory specimens during the acute phase of infection. Negative results do not preclude SARS-CoV-2 infection, do not rule out co-infections with other pathogens, and should not be used as the sole basis for treatment or other patient management decisions. Negative results must be combined with clinical observations, patient history, and epidemiological information. The expected result is Negative.  Fact Sheet for Patients: SugarRoll.be  Fact Sheet for Healthcare Providers: https://www.woods-mathews.com/  This test is not yet approved or cleared by the [place] FDA and  has been authorized for detection and/or diagnosis of SARS-CoV-2 by FDA under an Emergency Use Authorization (EUA). This EUA will remain  in effect (meaning this test can be used) for the duration of the COVID-19 declaration under Se ction 564(b)(1) of the Act, 21 U.S.C. section 360bbb-3(b)(1), unless the authorization is terminated or revoked sooner.  Performed at [facility], [place] [address], [place], [place] [address]     Coagulation Studies: No results for input(s): LABPROT, INR in the last 72 hours.  Urinalysis: No results for input(s):  COLORURINE, LABSPEC, [place], GLUCOSEU, HGBUR, BILIRUBINUR, KETONESUR, PROTEINUR, UROBILINOGEN, NITRITE, LEUKOCYTESUR in the last 72 hours.  Invalid input(s): APPERANCEUR    Imaging: DG Chest Port 1 View  Result Date: 03/11/2020 CLINICAL DATA:   86 year old female with history of hyperkalemia. EXAM: PORTABLE CHEST 1 VIEW COMPARISON:  Chest x-ray 01/18/2019. FINDINGS: Lung volumes are normal. No consolidative airspace disease. No pleural effusions. No evidence of pulmonary edema. No pneumothorax. Mild cardiomegaly. Upper mediastinal contours are within normal limits. Aortic atherosclerosis. Vascular stent projecting over the right upper extremity near the axilla. IMPRESSION: 1. No radiographic evidence of acute cardiopulmonary disease. 2. Cardiomegaly. 3. Aortic atherosclerosis. Electronically Signed   By: Vinnie Langton M.D.   On: 03/11/2020 14:36   VAS US DUPLEX DIALYSIS ACCESS (AVF, AVG)  Result Date: 03/10/2020 DIALYSIS ACCESS Access Site: Right Upper Extremity. Access Type: Brach Axillary AVG. History: Right BrachAx AVG created 09/13/2018. Comparison Study: 02/20/2020 Performing Technologist: Almira Coaster RVS  Examination Guidelines: A complete evaluation includes B-mode imaging, spectral Doppler, color Doppler, and power Doppler as needed of all accessible portions of each vessel. Unilateral testing is considered an integral part of a complete examination. Limited examinations for reoccurring indications may be performed as noted.  Findings:   +--------------------+----------+-----------------+--------+  AVG                  PSV (cm/s) Flow Vol (mL/min) Describe  +--------------------+----------+-----------------+--------+  Native artery inflow     70            133                  +--------------------+----------+-----------------+--------+  Arterial anastomosis     50                                 +--------------------+----------+-----------------+--------+  Prox graft               0                        occluded  +--------------------+----------+-----------------+--------+  Mid graft                0                        occluded  +--------------------+----------+-----------------+--------+  Distal graft             0                         occluded  +--------------------+----------+-----------------+--------+  Venous anastomosis       0                        occluded  +--------------------+----------+-----------------+--------+  Venous outflow           31                                 +--------------------+----------+-----------------+--------+ +---------------+-------------+---------+---------+----------+-----------------+                  Diameter (cm)   Depth   Branching PSV (cm/s)    Flow Volume                                      (  cm)                             (ml/min)       +---------------+-------------+---------+---------+----------+-----------------+  Right Radial                                          90                         Art                                                                             +---------------+-------------+---------+---------+----------+-----------------+  Summary: The Right Upper Arm AVG Brachial Axillary AVG appears to be occluded; Abnormal Flow Volume seen as well. A Hematoma was seen in the Distal Upper Arm area medially measuring 3.39cms x 1.30 cms.  *See table(s) above for measurements and observations.  Diagnosing physician: Hortencia Pilar MD Electronically signed by Hortencia Pilar MD on 03/10/2020 at 5:06:22 PM.    --------------------------------------------------------------------------------   Final      Medications:    ceFAZolin      [START ON 03/12/2020] Chlorhexidine Gluconate Cloth  6 each Topical Q0600   heparin  5,000 Units Subcutaneous Q8H   acetaminophen **OR** acetaminophen, bisacodyl, lidocaine (PF), ondansetron **OR** ondansetron (ZOFRAN) IV  Assessment/ Plan:  [name] is a 86 y.o. black female with end stage renal disease on hemodialysis (recently changed from peritoneal dialysis), hypertension, gout, hyperlipidemia, diabetes mellitus type II, atrial fibrillation who presents to [facility] on 03/11/2020 for Hyperkalemia [E87.5]  [facility]  left AVG 70.5kg   1. End Stage Renal Disease with hyperkalemia:  Complication of dialysis access. Appreciate vascular input.  Dialysis for today once access established. Orders prepared.   2. Hypertension:   Home regimen of metoprolol.   3. Anemia of chronic kidney disease: EPO with HD treatments.   4. Secondary Hyperparathyroidism:   - Auryxia with meals.     LOS: 0 Stacy Pham 8/31/20213:07 PM

## 2020-03-11 NOTE — Telephone Encounter (Signed)
I spoke with the patient's daughter by phone.  I explained to [name] that given her mom's potassium level of 5.7 that we could not move forward with the thrombectomy today.  This would require placing a temporary catheter and performing dialysis to reduce the potassium level and will plan to do the thrombectomy tomorrow.  She voiced understanding and is agreement with our plan

## 2020-03-11 NOTE — Op Note (Signed)
  OPERATIVE NOTE   PROCEDURE: 1. Insertion of temporary dialysis catheter catheter right common femoral approach.  PRE-OPERATIVE DIAGNOSIS: Hyperkalemia; thrombosis right arm brachial axillary graft; and stage renal disease on hemodialysis   POST-OPERATIVE DIAGNOSIS: Same  SURGEON: Katha Cabal M.D.  ANESTHESIA: 1% lidocaine local infiltration  ESTIMATED BLOOD LOSS: Minimal cc  INDICATIONS:   Stacy Pham is a 86 y.o. female who presents with thrombosis of her right arm AV access.  Laboratories obtained prior to thrombectomy demonstrated potassium of 5.7 and therefore she is undergoing temporary catheter placement and urgent dialysis before mechanical thrombectomy.  Risks and benefits of been reviewed all questions answered patient agrees to proceed.  DESCRIPTION: After obtaining full informed written consent, the patient was positioned supine. The right groin was prepped and draped in a sterile fashion. Ultrasound was placed in a sterile sleeve. Ultrasound was utilized to identify the right common femoral vein which is noted to be echolucent and compressible indicating patency. Images recorded for the permanent record. Under real-time visualization a Seldinger needle is inserted into the vein and the guidewires advanced without difficulty. Small counterincision was made at the wire insertion site. Dilator is passed over the wire and the temporary dialysis catheter catheter is fed over the wire without difficulty.  All lumens aspirate and flush easily and are packed with heparin saline. Catheter secured to the skin of the right thigh with 2-0 silk. A sterile dressing is applied with Biopatch.  COMPLICATIONS: None  CONDITION: Unchanged  Hortencia Pilar Office:  [phone] 03/11/2020, 5:36 PM

## 2020-03-11 NOTE — Progress Notes (Signed)
This note also relates to the following rows which could not be included: Resp - Cannot attach notes to unvalidated device data  D completed

## 2020-03-11 NOTE — H&P (Signed)
History and Physical    Stacy Pham YDX:[id number] DOB: 02-16-1931 DOA: 03/11/2020  PCP: Elby Beck, FNP Patient coming from:home    Chief Complaint: clogged AV fistula  HPI: 86 y/o F w/ PMH of ESRD on HD TTS, HTN, HLD, a. fib, DM2, PVD who presents w/ clogged AV fistula x several days. Pt was suppose to have thrombectomy of the AV fistula today but vascular surgery was unable to do this secondary to hyperkalemia. A temporary HD cath was placed by vascular surgery so the pt can have HD today and go from thrombectomy tomorrow. Pt denies any fever chills, sweating, chest pain, shortness of breath, cough, nausea, vomiting, abd pain, dysuria, diarrhea, or constipation.   Review of Systems: As per HPI otherwise 10 point review of systems negative.    Past Medical History:  Diagnosis Date  . (HFpEF) heart failure with preserved ejection fraction ([place])    a. 10/2017 Echo: EF 60-65%, Gr1 DD, mild MR, mildly dil LA/RA. PASP 44mHg.  . Arthritis   . Back pain   . Bronchitis   . Cataract   . Diabetes mellitus without complication (HCC)    no meds currently  . [place]   . Dysrhythmia   . [place] ([place] stage [place] disease) [place]    a. 2018 - initially on HD but then transitioned to nightly PD in 08/2017.  .Marland KitchenGout   . History of methicillin resistant staphylococcus aureus (MRSA)   . Hypertension   . PAF (paroxysmal atrial fibrillation) (HCC)    a. CHA2DS2VASc = 5-->eliquis 2.5 BID.    Past Surgical History:  Procedure Laterality Date  . A/V FISTULAGRAM Right 11/13/2019   Procedure: A/V FISTULAGRAM;  Surgeon: SKatha Cabal MD;  [facility];  Service: Cardiovascular;  Laterality: Right;  . AMPUTATION Left 01/09/2020   Procedure: AMPUTATION RAY ( GREAT TOE );  Surgeon: SKatha Cabal MD;  Location: [facility];  Service: Vascular;  Laterality: Left;  . AV FISTULA PLACEMENT Right 09/13/2018   Procedure: INSERTION OF ARTERIOVENOUS (AV) GORE-TEX GRAFT  ARM;  Surgeon: SKatha Cabal MD;  Location: [facility];  Service: Vascular;  Laterality: Right;  . CAPD INSERTION N/A 06/08/2017   Procedure: LAPAROSCOPIC INSERTION CONTINUOUS AMBULATORY PERITONEAL DIALYSIS  (CAPD) CATHETER;  Surgeon: SKatha Cabal MD;  Location: [facility];  Service: Vascular;  Laterality: N/A;  . CATARACT EXTRACTION, BILATERAL Bilateral   . DIALYSIS/PERMA CATHETER INSERTION N/A 03/18/2017   Procedure: DIALYSIS/PERMA CATHETER INSERTION;  Surgeon: SKatha Cabal MD;  [place];  Service: Cardiovascular;  Laterality: N/A;  . DIALYSIS/PERMA CATHETER REMOVAL N/A 10/20/2017   Procedure: DIALYSIS/PERMA CATHETER REMOVAL;  Surgeon: DAlgernon Huxley MD;  [place] [facility];  Service: Cardiovascular;  Laterality: N/A;  . EYE SURGERY    . LOWER EXTREMITY ANGIOGRAPHY Left 09/11/2019   Procedure: LOWER EXTREMITY ANGIOGRAPHY;  Surgeon: SKatha Cabal MD;  [facility];  Service: Cardiovascular;  Laterality: Left;  . LOWER EXTREMITY ANGIOGRAPHY Right 10/17/2019   Procedure: LOWER EXTREMITY ANGIOGRAPHY;  Surgeon: SKatha Cabal MD;  [facility];  Service: Cardiovascular;  Laterality: Right;  . LOWER EXTREMITY ANGIOGRAPHY Left 11/20/2019   Procedure: LOWER EXTREMITY ANGIOGRAPHY;  Surgeon: SKatha Cabal MD;  [place];  Service: Cardiovascular;  Laterality: Left;  . REMOVAL OF A DIALYSIS CATHETER N/A 02/23/2019   Procedure: REMOVAL OF A DIALYSIS CATHETER ( PD CATH);  Surgeon: SKatha Cabal  MD;  Location: [facility];  Service: Vascular;  Laterality: N/A;     reports that she has never smoked. She has never used smokeless tobacco. She reports that she does not drink alcohol and does not use drugs.  No Known Allergies  Family History  Problem Relation Age of Onset  . Hypertension Father      Prior to Admission medications   Medication Sig Start Date End Date Taking? Authorizing  Provider  acetaminophen (TYLENOL 8 HOUR ARTHRITIS PAIN) 650 MG CR tablet Take 650 mg every 8 (eight) hours as needed by mouth for pain.   Yes [provider]  allopurinol (ZYLOPRIM) 100 MG tablet TAKE 1 TABLET BY MOUTH EVERYDAY AT BEDTIME 09/12/19  Yes Elby Beck, FNP  amiodarone (PACERONE) 200 MG tablet Take 1 tablet (200 mg total) by mouth every morning. Take 1 tablet (200 mg) by mouth once daily Patient taking differently: Take 200 mg by mouth every morning.  10/24/19  Yes Theora Gianotti, NP  amLODipine (NORVASC) 5 MG tablet Take 1 tablet (5 mg total) by mouth daily. 02/18/20  Yes Elby Beck, FNP  atorvastatin (LIPITOR) 20 MG tablet Take 1 tablet (20 mg total) by mouth every evening. 01/01/20  Yes Elby Beck, FNP  blood glucose meter kit and supplies KIT Please dispense either One Touch or Bayer Contour She must have one of these machines since she is on peritoneal dialysis. Use up to four times daily as directed. (FOR ICD-9 250.00, 250.01). 09/23/17  Yes Elby Beck, FNP  ciprofloxacin (CIPRO) 250 MG tablet Take 250 mg by mouth 2 (two) times daily. 12/11/19  Yes [provider]  clopidogrel (PLAVIX) 75 MG tablet TAKE 1 TABLET BY MOUTH EVERY DAY 02/11/20  Yes Schnier, Dolores Lory, MD  colchicine 0.6 MG tablet Take 0.6 mg by mouth daily as needed (gout).   Yes [provider]  ELIQUIS 2.5 MG TABS tablet TAKE 1 TABLET BY MOUTH TWICE A DAY Patient taking differently: Take 2.5 mg by mouth 2 (two) times daily.  09/26/19  Yes Elby Beck, FNP  Ferric Citrate (AURYXIA PO) Take 220 mg by mouth 3 (three) times daily.   Yes [provider]  HYDROcodone-acetaminophen (NORCO) 5-325 MG tablet Take 1 tablet by mouth every 6 (six) hours as needed for moderate pain or severe pain. 03/10/20  Yes Kris Hartmann, NP  metoprolol tartrate (LOPRESSOR) 25 MG tablet Take 0.5 tablets (12.5 mg total) by mouth 2 (two) times daily. 02/05/19  Yes Dunn, Areta Haber, PA-C  mupirocin ointment (BACTROBAN) 2 % Apply 1 application topically daily. 03/10/20  Yes Kris Hartmann, NP    Physical Exam: Vitals:   03/11/20 1235 03/11/20 1459  BP: (!) 166/61 (!) 169/55  Pulse: (!) 55 (!) 57  Resp: 15 20  Temp: 97.9 F (36.6 C)   TempSrc: Oral   SpO2: 97% 98%  Weight: 68 kg   Height: _0  (1.6 m)     Constitutional: NAD, calm, comfortable Vitals:   03/11/20 1235 03/11/20 1459  BP: (!) 166/61 (!) 169/55  Pulse: (!) 55 (!) 57  Resp: 15 20  Temp: 97.9 F (36.6 C)   TempSrc: Oral   SpO2: 97% 98%  Weight: 68 kg   Height: _1  (1.6 m)    Eyes: PERRL, lids and conjunctivae normal ENMT: Mucous membranes are moist.  Neck: normal, supple Respiratory: clear to auscultation bilaterally, no wheezing, no rhonchi Cardiovascular: S1/S2+, no rubs / gallops.  Abdomen: soft, no tenderness, non-distended Bowel sounds positive.  Musculoskeletal: no cyanosis. Moves all extremities Skin: no rashes, lesions, ulcers.  Neurologic: CN 2-12 grossly intact.  Psychiatric: Normal judgment and insight. Alert and oriented x 3. Normal mood.    Labs on Admission: I have personally reviewed following labs and imaging studies  CBC: No results for input(s): WBC, NEUTROABS, HGB, HCT, MCV, PLT in the last 168 hours. Basic Metabolic Panel: Recent Labs  Lab 03/11/20 1320  K 5.7*   GFR: CrCl cannot be calculated (Patient's most recent lab result is older than the maximum 21 days allowed.). Liver Function Tests: No results for input(s): AST, ALT, ALKPHOS, BILITOT, PROT, ALBUMIN in the last 168 hours. No results for input(s): LIPASE, AMYLASE in the last 168 hours. No results for input(s): AMMONIA in the last 168 hours. Coagulation Profile: No results for input(s): INR, PROTIME in the last 168 hours. Cardiac Enzymes: No results for input(s): CKTOTAL, CKMB, CKMBINDEX, TROPONINI in the last 168 hours. BNP (last 3 results) No results for input(s): PROBNP in the last 8760  hours. HbA1C: No results for input(s): HGBA1C in the last 72 hours. CBG: Recent Labs  Lab 03/11/20 1250  GLUCAP 96   Lipid Profile: No results for input(s): CHOL, HDL, LDLCALC, TRIG, CHOLHDL, LDLDIRECT in the last 72 hours. Thyroid Function Tests: No results for input(s): TSH, T4TOTAL, FREET4, T3FREE, THYROIDAB in the last 72 hours. Anemia Panel: No results for input(s): VITAMINB12, FOLATE, FERRITIN, TIBC, IRON, RETICCTPCT in the last 72 hours. Urine analysis: No results found for: COLORURINE, APPEARANCEUR, LABSPEC, PHURINE, GLUCOSEU, HGBUR, BILIRUBINUR, KETONESUR, PROTEINUR, UROBILINOGEN, NITRITE, LEUKOCYTESUR  Radiological Exams on Admission: DG Chest Port 1 View  Result Date: 03/11/2020 CLINICAL DATA:  86 year old female with history of hyperkalemia. EXAM: PORTABLE CHEST 1 VIEW COMPARISON:  Chest x-ray 01/18/2019. FINDINGS: Lung volumes are normal. No consolidative airspace disease. No pleural effusions. No evidence of pulmonary edema. No pneumothorax. Mild cardiomegaly. Upper mediastinal contours are within normal limits. Aortic atherosclerosis. Vascular stent projecting over the right upper extremity near the axilla. IMPRESSION: 1. No radiographic evidence of acute cardiopulmonary disease. 2. Cardiomegaly. 3. Aortic atherosclerosis. Electronically Signed   By: Vinnie Langton M.D.   On: 03/11/2020 14:36   VAS US DUPLEX DIALYSIS ACCESS (AVF, AVG)  Result Date: 03/10/2020 DIALYSIS ACCESS Access Site: Right Upper Extremity. Access Type: Brach Axillary AVG. History: Right BrachAx AVG created 09/13/2018. Comparison Study: 02/20/2020 Performing Technologist: Almira Coaster RVS  Examination Guidelines: A complete evaluation includes B-mode imaging, spectral Doppler, color Doppler, and power Doppler as needed of all accessible portions of each vessel. Unilateral testing is considered an integral part of a complete examination. Limited examinations for reoccurring indications may be performed as  noted.  Findings:   +--------------------+----------+-----------------+--------+ AVG                 PSV (cm/s)Flow Vol (mL/min)Describe +--------------------+----------+-----------------+--------+ Native artery inflow    70           133                +--------------------+----------+-----------------+--------+ Arterial anastomosis    50                              +--------------------+----------+-----------------+--------+ Prox graft              0                      occluded +--------------------+----------+-----------------+--------+  Mid graft               0                      occluded +--------------------+----------+-----------------+--------+ Distal graft            0                      occluded +--------------------+----------+-----------------+--------+ Venous anastomosis      0                      occluded +--------------------+----------+-----------------+--------+ Venous outflow          31                              +--------------------+----------+-----------------+--------+ +---------------+-------------+---------+---------+----------+-----------------+                Diameter (cm)  Depth  BranchingPSV (cm/s)   Flow Volume                                  (cm)                          (ml/min)      +---------------+-------------+---------+---------+----------+-----------------+ Right Radial                                      90                      Art                                                                       +---------------+-------------+---------+---------+----------+-----------------+  Summary: The Right Upper Arm AVG Brachial Axillary AVG appears to be occluded; Abnormal Flow Volume seen as well. A Hematoma was seen in the Distal Upper Arm area medially measuring 3.39cms x 1.30 cms.  *See table(s) above for measurements and observations.  Diagnosing physician: Hortencia Pilar MD Electronically signed by  Hortencia Pilar MD on 03/10/2020 at 5:06:22 PM.    --------------------------------------------------------------------------------   Final     EKG: Independently reviewed  Assessment/Plan Active Problems:   Hyperkalemia   Clogged AV fistula: will go tomorrow for thrombectomy as per vascular surgery. NPO after midnight. Can be d/c home tomorrow if ok w/ vascular surg & nephro  Hyperkalemia: likely secondary to recently missed HD secondary to clogged AV fistula  ESRD: on HD TTS. HD as per nephro. Nephro recs apprec   A. fib: likely PAF. Will continue on home dose of amiodarone,metoprolol. Hold eliquis for tomorrow thrombectomy   HTN: will continue on home dose of amlodipine, metoprolol  HLD: will continue on statin   Hx of likely gout: will restart home dose of allopurinol        DVT prophylaxis: SCDs Code Status: full  Family Communication: Disposition Plan: can likely d/c back home tomorrow if ok w/ nephro & vascular surgery  Consults called: nephro, vascular surgery Admission status: observation  Wyvonnia Dusky MD [facility] Pager [phone]  If 7PM-7AM, please contact night-coverage [email or web address]   03/11/2020, 3:07 PM

## 2020-03-12 ENCOUNTER — Encounter
Admission: AC | Disposition: A | Discharge status: Home / Self Care | Payer: Self-pay | Source: Home / Self Care | Attending: Internal Medicine

## 2020-03-12 ENCOUNTER — Encounter: Payer: Self-pay | Admitting: Vascular Surgery

## 2020-03-12 DIAGNOSIS — Z992 Dependence on renal dialysis: Secondary | ICD-10-CM | POA: Diagnosis not present

## 2020-03-12 DIAGNOSIS — E875 Hyperkalemia: Secondary | ICD-10-CM | POA: Diagnosis not present

## 2020-03-12 DIAGNOSIS — E119 Type 2 diabetes mellitus without complications: Secondary | ICD-10-CM | POA: Diagnosis not present

## 2020-03-12 DIAGNOSIS — D631 Anemia in chronic kidney disease: Secondary | ICD-10-CM | POA: Diagnosis not present

## 2020-03-12 DIAGNOSIS — I120 Hypertensive chronic kidney disease with stage 5 chronic kidney disease or end stage renal disease: Secondary | ICD-10-CM | POA: Diagnosis not present

## 2020-03-12 DIAGNOSIS — N2581 Secondary hyperparathyroidism of renal origin: Secondary | ICD-10-CM | POA: Diagnosis not present

## 2020-03-12 DIAGNOSIS — T82868A Thrombosis of vascular prosthetic devices, implants and grafts, initial encounter: Secondary | ICD-10-CM | POA: Diagnosis not present

## 2020-03-12 DIAGNOSIS — N186 End stage renal disease: Secondary | ICD-10-CM | POA: Diagnosis not present

## 2020-03-12 DIAGNOSIS — Z7901 Long term (current) use of anticoagulants: Secondary | ICD-10-CM | POA: Diagnosis not present

## 2020-03-12 HISTORY — PX: PERIPHERAL VASCULAR THROMBECTOMY: CATH118306

## 2020-03-12 LAB — BASIC METABOLIC PANEL
Anion gap: 12 (ref 5–15)
BUN: 36 mg/dL — ABNORMAL HIGH (ref 8–23)
CO2: 27 mmol/L (ref 22–32)
Calcium: 8.9 mg/dL (ref 8.9–10.3)
Chloride: 99 mmol/L (ref 98–111)
Creatinine, Ser: 5.59 mg/dL — ABNORMAL HIGH (ref 0.44–1.00)
GFR calc Af Amer: 7 mL/min — ABNORMAL LOW (ref >60)
GFR calc non Af Amer: 6 mL/min — ABNORMAL LOW (ref >60)
Glucose, Bld: 84 mg/dL (ref 70–99)
Potassium: 4.1 mmol/L (ref 3.5–5.1)
Sodium: 138 mmol/L (ref 135–145)

## 2020-03-12 LAB — CBC
HCT: 26.9 % — ABNORMAL LOW (ref 36.0–46.0)
Hemoglobin: 8.6 g/dL — ABNORMAL LOW (ref 12.0–15.0)
MCH: 31.9 pg (ref 26.0–34.0)
MCHC: 32 g/dL (ref 30.0–36.0)
MCV: 99.6 fL (ref 80.0–100.0)
Platelets: 283 10*3/uL (ref 150–400)
RBC: 2.7 MIL/uL — ABNORMAL LOW (ref 3.87–5.11)
RDW: 15.1 % (ref 11.5–15.5)
WBC: 6.8 10*3/uL (ref 4.0–10.5)
nRBC: 0 % (ref 0.0–0.2)

## 2020-03-12 SURGERY — PERIPHERAL VASCULAR THROMBECTOMY
Anesthesia: Moderate Sedation | Laterality: Right

## 2020-03-12 MED ORDER — MIDAZOLAM HCL 5 MG/5ML IJ SOLN
INTRAMUSCULAR | Status: AC
  Filled 2020-03-12: qty 5

## 2020-03-12 MED ORDER — HEPARIN SODIUM (PORCINE) 1000 UNIT/ML IJ SOLN
INTRAMUSCULAR | Status: DC | PRN
  Administered 2020-03-12: 4000 [IU] via INTRAVENOUS

## 2020-03-12 MED ORDER — METHYLPREDNISOLONE SODIUM SUCC 125 MG IJ SOLR: 125.0000 mg | Freq: Once | INTRAMUSCULAR | Status: DC | PRN

## 2020-03-12 MED ORDER — HYDROMORPHONE HCL 1 MG/ML IJ SOLN: 1.0000 mg | Freq: Once | INTRAMUSCULAR | Status: DC | PRN

## 2020-03-12 MED ORDER — ONDANSETRON HCL 4 MG/2ML IJ SOLN: 4.0000 mg | Freq: Four times a day (QID) | INTRAMUSCULAR | Status: DC | PRN

## 2020-03-12 MED ORDER — FENTANYL CITRATE (PF) 100 MCG/2ML IJ SOLN
INTRAMUSCULAR | Status: DC | PRN
Start: 2020-03-12 — End: 2020-03-12
  Administered 2020-03-12: 50 ug via INTRAVENOUS
  Administered 2020-03-12: 25 ug via INTRAVENOUS

## 2020-03-12 MED ORDER — IODIXANOL 320 MG/ML IV SOLN
INTRAVENOUS | Status: DC | PRN
  Administered 2020-03-12: 40 mL

## 2020-03-12 MED ORDER — CEFAZOLIN SODIUM-DEXTROSE 1-4 GM/50ML-% IV SOLN
1.0000 g | Freq: Once | INTRAVENOUS | Status: AC
  Filled 2020-03-12: qty 50

## 2020-03-12 MED ORDER — DIPHENHYDRAMINE HCL 50 MG/ML IJ SOLN: 50.0000 mg | Freq: Once | INTRAMUSCULAR | Status: DC | PRN

## 2020-03-12 MED ORDER — HEPARIN SODIUM (PORCINE) 1000 UNIT/ML IJ SOLN
INTRAMUSCULAR | Status: AC
  Filled 2020-03-12: qty 1

## 2020-03-12 MED ORDER — MIDAZOLAM HCL 2 MG/ML PO SYRP: 8.0000 mg | ORAL_SOLUTION | Freq: Once | ORAL | Status: DC | PRN

## 2020-03-12 MED ORDER — FENTANYL CITRATE (PF) 100 MCG/2ML IJ SOLN
INTRAMUSCULAR | Status: AC
  Filled 2020-03-12: qty 2

## 2020-03-12 MED ORDER — FAMOTIDINE 20 MG PO TABS: 40.0000 mg | ORAL_TABLET | Freq: Once | ORAL | Status: DC | PRN

## 2020-03-12 MED ORDER — MIDAZOLAM HCL 2 MG/2ML IJ SOLN
INTRAMUSCULAR | Status: DC | PRN
  Administered 2020-03-12 (×2): 1 mg via INTRAVENOUS

## 2020-03-12 MED ORDER — SODIUM CHLORIDE 0.9 % IV SOLN: INTRAVENOUS | Status: DC

## 2020-03-12 SURGICAL SUPPLY — 22 items
BALLN DORADO 8X60X80 (BALLOONS) ×3
BALLN LUTONIX AV 10X40X75 (BALLOONS) ×3
BALLN ULTRVRSE 8X40X75C (BALLOONS) ×3
BALLOON DORADO 8X60X80 (BALLOONS) ×1 IMPLANT
BALLOON LUTONIX AV 10X40X75 (BALLOONS) ×1 IMPLANT
BALLOON ULTRVRSE 8X40X75C (BALLOONS) ×1 IMPLANT
CATH BEACON 5 .035 65 KMP TIP (CATHETERS) ×3 IMPLANT
DEVICE PRESTO INFLATION (MISCELLANEOUS) ×3 IMPLANT
DRAPE BRACHIAL (DRAPES) ×3 IMPLANT
GLIDEWIRE STIFF .35X180X3 HYDR (WIRE) ×3 IMPLANT
KIT THROMB PERC PTD (MISCELLANEOUS) ×3 IMPLANT
NEEDLE ENTRY 21GA 7CM ECHOTIP (NEEDLE) ×6 IMPLANT
PACK ANGIOGRAPHY (CUSTOM PROCEDURE TRAY) ×3 IMPLANT
SET INTRO CAPELLA COAXIAL (SET/KITS/TRAYS/PACK) ×3 IMPLANT
SHEATH BRITE TIP 6FRX5.5 (SHEATH) ×6 IMPLANT
SHEATH BRITE TIP 7FRX5.5 (SHEATH) ×3 IMPLANT
STENT VIABAHN 8X50X120 (Permanent Stent) ×3 IMPLANT
STENT VIABAHN 8X7.5X120 (Permanent Stent) ×3 IMPLANT
STENT VIABAHN5X120X8X (Permanent Stent) ×1 IMPLANT
SUT MNCRL AB 4-0 PS2 18 (SUTURE) ×6 IMPLANT
WIRE G 018X200 V18 (WIRE) ×3 IMPLANT
WIRE MAGIC TOR.035 180C (WIRE) ×3 IMPLANT

## 2020-03-12 NOTE — Op Note (Signed)
OPERATIVE NOTE   PROCEDURE: 1. Contrast injection right arm brachial axillary AV graft. 2. Mechanical thrombectomy right arm brachial axillary AV graft. 3. Percutaneous transluminal angioplasty and stent placement peripheral segment AV graft with an 8 mm diameter Viabahn stent postdilated to 8 mm. 4. Percutaneous transluminal angioplasty of the subclavian vein to 10 mm with a Lutonix drug-eluting balloon   PRE-OPERATIVE DIAGNOSIS: Complication of dialysis access                                                       End Stage Renal Disease  POST-OPERATIVE DIAGNOSIS: same as above   SURGEON: Katha Cabal, M.D.  ANESTHESIA: Conscious sedation was administered by the radiology RN under my direct supervision. IV Versed plus fentanyl were utilized. Continuous ECG, pulse oximetry and blood pressure was monitored throughout the entire procedure. Conscious sedation was for a total of 67 minutes.    ESTIMATED BLOOD LOSS: minimal  FINDING(S): 1. Thrombus within the graft.  In the midportion of the graft itself there are tandem lesions of greater than 80%.  In the subclavian vein just past the previously placed stent there is a greater than 70% stenosis of the vein.  More proximally the innominate vein demonstrates a 50% stenosis which does not appear to be hemodynamically significant.  Superior vena cava is widely patent.  Visualized portion of the brachial artery and the actual arterial anastomosis are widely patent  SPECIMEN(S):  None  CONTRAST: 40 cc  FLUOROSCOPY TIME: 6.5 minutes  INDICATIONS: Stacy Pham is a 86 y.o. female who  presents with malfunctioning right arm AV access.  The patient is scheduled for angiography with possible intervention of the AV access.  The patient is aware the risks include but are not limited to: bleeding, infection, thrombosis of the cannulated access, and possible anaphylactic reaction to the contrast.  The patient acknowledges if the access can not  be salvaged a tunneled catheter will be needed and will be placed during this procedure.  The patient is aware of the risks of the procedure and elects to proceed with the angiogram and intervention.  DESCRIPTION: After full informed written consent was obtained, the patient was brought back to the Special Procedure suite and placed supine position.  Appropriate cardiopulmonary monitors were placed.  The right arm was prepped and draped in the standard fashion.  Appropriate timeout is called. The right brachial axillary AV graft was cannulated with a micropuncture needle using ultrasound guidance.  With the ultrasound the AV access appeared to be filled with heterogeneous material and was poorly compressible indicating thrombosis of the AV access. The puncture was made under direct ultrasound visualization and an image was recorded for the permanent record.  The microwire was advanced and the needle was exchanged for  a microsheath.  The J-wire was then advanced and a 6 Fr sheath inserted.  Hand was then performed which demonstrated thrombus within the AV access.  The central venous structures were also imaged by hand injections.  3000 units of heparin was given and allowed to circulate as well.  A Trerotola device was then advanced beginning centrally and pulling back performing.  Several passes were made through the venous portion of the graft. Follow-up imaging now demonstrates the vast majority of the clot had been treated. Therefore a retrograde sheath was inserted. This was  a 6 French sheath and was positioned more proximally on the arm and angled in the retrograde direction. The right arm brachial axillary graft was cannulated with a micropuncture needle using ultrasound guidance.  With the ultrasound the AV access appeared to be filled with heterogeneous material and was poorly compressible indicating thrombosis of the AV access. The puncture was made under direct ultrasound visualization and an image  was recorded for the permanent record.  Subsequently a floppy Glidewire and a KMP catheter were negotiated into the arterial system hand injection contrast was then utilized to demonstrate patency of the artery as well as the location for the anastomosis. The Trerotola device was now advanced through the retrograde sheath was extended out into the artery the basket was opened and it was slowly pulled back into the graft and then the basket was engaged. Several passes were made on the arterial portion and pulsatility of the access was reestablished. Follow-up imaging demonstrates there was now thrombus in the venous portion surrounding the sheath and this was treated with the Trerotola device from the antegrade direction. After several passes imaging demonstrated resolution of thrombus within the graft and forward flow however stricture of the graft was noted area  Magic torque wire was then advanced through the antegrade sheath and an 10 mm x 40 mm Lutonix drug-eluting balloon was used to angioplasty the subclavian vein.  Single inflation was performed for 1 minute with maximum pressures of 14 ATM.  Follow-up imaging demonstrated wide patency of the subclavian vein with less than 5% residual stenosis.  The retrograde sheath was then upsized to a 7 French sheath in the Magic torque wire and Kumpe catheter negotiated the brachial artery.  Brachial imaging was then performed by hand-injection through the Kumpe catheter.  This demonstrated the brachial artery was widely patent and the arterial anastomosis at the level of the anastomosis was widely patent.  Again was noted the tandem greater than 80% lesions in the midportion of the graft.  A 8 mm x 7.5 cm Viabahn and then an 8 mm x 5 cm Viabahn were then deployed overlapping slightly across the graft itself.  The antegrade sheath was removed at the time of deployment.  The stents were then postdilated with an 8 mm [place] balloon serial inflations were performed to  16 to 20 atm for 30 seconds.  Kumpe catheter was then reintroduced over the wire and hand-injection contrast used to visualize the entire AV graft and outflow.  It is now widely patent with less than 5% residual stenosis. A 4-0 Monocryl purse-string suture was sewn around both of the sheaths.  The sheaths were removed and light pressure was applied.  A sterile bandage was applied to the puncture site.    COMPLICATIONS: None  CONDITION: Improved  Katha Cabal, M.D [facility] Office: [phone]  03/12/2020 3:13 PM

## 2020-03-12 NOTE — Progress Notes (Signed)
[facility]  ROUNDING NOTE   Subjective:   Hemodialysis treatment yesterday. Tolerated treatment well. Uf of 578mL.   K 4.1   Objective:  Vital signs in last 24 hours:  Temp:  [98.5 F (36.9 C)-99 F (37.2 C)] 98.7 F (37.1 C) (09/01 1253) Pulse Rate:  [54-81] 60 (09/01 1253) Resp:  [12-20] 17 (09/01 1455) BP: (110-186)/(51-117) 186/62 (09/01 1455) SpO2:  [96 %-100 %] 100 % (09/01 1455) Weight:  [68 kg] 68 kg (09/01 1253)  Weight change:  Filed Weights   03/11/20 1235 03/12/20 1253  Weight: 68 kg 68 kg    Intake/Output: I/O last 3 completed shifts: In: 120 [P.O.:120] Out: 500 [Other:500]   Intake/Output this shift:  No intake/output data recorded.  Physical Exam: General: NAD,   Head: Normocephalic, atraumatic. Moist oral mucosal membranes  Eyes: Anicteric, PERRL  Neck: Supple, trachea midline  Lungs:  Clear to auscultation  Heart: Regular rate and rhythm  Abdomen:  Soft, nontender,   Extremities:  no peripheral edema.  Neurologic: Nonfocal, moving all four extremities  Skin: No lesions  Access: Right arm AVG no bruit no thrill    Basic Metabolic Panel: Recent Labs  Lab 03/11/20 1320 03/11/20 1616 03/12/20 0601  NA  --  137 138  K 5.7* 4.0 4.1  CL  --  98 99  CO2  --  27 27  GLUCOSE  --  95 84  BUN  --  64* 36*  CREATININE  --  6.50* 5.59*  CALCIUM  --  8.8* 8.9  MG  --  2.0  --     Liver Function Tests: No results for input(s): AST, ALT, ALKPHOS, BILITOT, PROT, ALBUMIN in the last 168 hours. No results for input(s): LIPASE, AMYLASE in the last 168 hours. No results for input(s): AMMONIA in the last 168 hours.  CBC: Recent Labs  Lab 03/11/20 1616 03/12/20 0601  WBC 7.6 6.8  HGB 9.0* 8.6*  HCT 27.8* 26.9*  MCV 98.2 99.6  PLT 300 283    Cardiac Enzymes: No results for input(s): CKTOTAL, CKMB, CKMBINDEX, TROPONINI in the last 168 hours.  BNP: Invalid input(s): POCBNP  CBG: Recent Labs  Lab 03/11/20 1250  GLUCAP 96     Microbiology: Results for orders placed or performed during the hospital encounter of 03/10/20  SARS CORONAVIRUS 2 (TAT 6-24 HRS) Nasopharyngeal Nasopharyngeal Swab     Status: None   Collection Time: 03/10/20 12:25 PM   Specimen: Nasopharyngeal Swab  Result Value Ref Range Status   SARS Coronavirus 2 NEGATIVE NEGATIVE Final    Comment: (NOTE) SARS-CoV-2 target nucleic acids are NOT DETECTED.  The SARS-CoV-2 RNA is generally detectable in upper and lower respiratory specimens during the acute phase of infection. Negative results do not preclude SARS-CoV-2 infection, do not rule out co-infections with other pathogens, and should not be used as the sole basis for treatment or other patient management decisions. Negative results must be combined with clinical observations, patient history, and epidemiological information. The expected result is Negative.  Fact Sheet for Patients: SugarRoll.be  Fact Sheet for Healthcare Providers: https://www.woods-mathews.com/  This test is not yet approved or cleared by the [place] FDA and  has been authorized for detection and/or diagnosis of SARS-CoV-2 by FDA under an Emergency Use Authorization (EUA). This EUA will remain  in effect (meaning this test can be used) for the duration of the COVID-19 declaration under Se ction 564(b)(1) of the Act, 21 U.S.C. section 360bbb-3(b)(1), unless the authorization is  terminated or revoked sooner.  Performed at [facility], [place] [address], [place], [place] [address]     Coagulation Studies: No results for input(s): LABPROT, INR in the last 72 hours.  Urinalysis: No results for input(s): COLORURINE, LABSPEC, PHURINE, GLUCOSEU, HGBUR, BILIRUBINUR, KETONESUR, PROTEINUR, UROBILINOGEN, NITRITE, LEUKOCYTESUR in the last 72 hours.  Invalid input(s): APPERANCEUR    Imaging: PERIPHERAL VASCULAR CATHETERIZATION  Result Date: 03/11/2020 See op  note  DG Chest Port 1 View  Result Date: 03/11/2020 CLINICAL DATA:  86 year old female with history of hyperkalemia. EXAM: PORTABLE CHEST 1 VIEW COMPARISON:  Chest x-ray 01/18/2019. FINDINGS: Lung volumes are normal. No consolidative airspace disease. No pleural effusions. No evidence of pulmonary edema. No pneumothorax. Mild cardiomegaly. Upper mediastinal contours are within normal limits. Aortic atherosclerosis. Vascular stent projecting over the right upper extremity near the axilla. IMPRESSION: 1. No radiographic evidence of acute cardiopulmonary disease. 2. Cardiomegaly. 3. Aortic atherosclerosis. Electronically Signed   By: Vinnie Langton M.D.   On: 03/11/2020 14:36     Medications:   . sodium chloride     . [MAR Hold] allopurinol  100 mg Oral Daily  . [MAR Hold] amiodarone  200 mg Oral Daily  . [MAR Hold] amLODipine  5 mg Oral Daily  . [MAR Hold] atorvastatin  20 mg Oral Daily  . [MAR Hold] Chlorhexidine Gluconate Cloth  6 each Topical Q0600  . fentaNYL      . heparin sodium (porcine)      . [MAR Hold] metoprolol tartrate  12.5 mg Oral BID  . midazolam       [MAR Hold] acetaminophen **OR** [MAR Hold] acetaminophen, [MAR Hold] bisacodyl, diphenhydrAMINE, famotidine, [MAR Hold] hydrALAZINE, [MAR Hold]  HYDROmorphone (DILAUDID) injection, methylPREDNISolone (SOLU-MEDROL) injection, midazolam, [MAR Hold] ondansetron **OR** [MAR Hold] ondansetron (ZOFRAN) IV, [MAR Hold] ondansetron (ZOFRAN) IV  Assessment/ Plan:  Stacy Pham is a 86 y.o. black female with end stage renal disease on hemodialysis (recently changed from peritoneal dialysis), hypertension, gout, hyperlipidemia, diabetes mellitus type II, atrial fibrillation who presents to [facility] on 03/11/2020 for Hyperkalemia [E87.5]  [facility] TTS [facility] left AVG 70.5kg   1. End Stage Renal Disease with hyperkalemia:  Complication of dialysis access. Appreciate vascular input.  Vascular to do thrombectomy today.   2.  Hypertension:   Home regimen of metoprolol.   3. Anemia of chronic kidney disease: hemoglobin 8.6 EPO with HD treatments.   4. Secondary Hyperparathyroidism:   - Auryxia with meals.    LOS: 0 Stacy Pham 9/1/20213:15 PM

## 2020-03-12 NOTE — Discharge Summary (Signed)
Physician Discharge Summary   Stacy Pham  female DOB: [birth year]  XKP:[id number]  PCP: Elby Beck, FNP  Admit date: 03/11/2020 Discharge date: 03/12/2020  Admitted From: home Disposition:  home CODE STATUS: Full code   Hospital Course:  For full details, please see H&P, progress notes, consult notes and ancillary notes.  Briefly,  Stacy Pham is a 86 y/o F w/ PMH of ESRD on HD TTS, HTN, HLD, a. fib, DM2, PVD who presents w/ clogged AV fistula x several days. Pt was suppose to have thrombectomy of the AV fistula on the day of arrival but vascular surgery was unable to do this secondary to hyperkalemia.  Hyperkalemia secondary to recently missed HD secondary to clogged AV fistula.   A temporary HD cath was placed by vascular surgery so the pt can have HD to correct hyperkalemia.  Clogged AV fistula Pt received successful thrombectomy with vascular surgery on 03/12/20.  ESRD on HD TTS.  iHD as per nephro.   PAF on Eliquis Continued home dose of amiodarone, metoprolol.  Eliquis held prior prior to thrombectomy and resumed after.  HTN:  continued on home dose of amlodipine, metoprolol  HLD:  continued on statin   Hx of likely gout: Continued home dose of allopurinol   Discharge Diagnoses:  Active Problems:   Hyperkalemia    Discharge Instructions:  Allergies as of 03/12/2020   No Known Allergies     Medication List    STOP taking these medications   ciprofloxacin 250 MG tablet Commonly known as: CIPRO     TAKE these medications   allopurinol 100 MG tablet Commonly known as: ZYLOPRIM TAKE 1 TABLET BY MOUTH EVERYDAY AT BEDTIME   amiodarone 200 MG tablet Commonly known as: PACERONE Take 1 tablet (200 mg total) by mouth every morning. Take 1 tablet (200 mg) by mouth once daily What changed: additional instructions   amLODipine 5 MG tablet Commonly known as: NORVASC Take 1 tablet (5 mg total) by mouth daily.   atorvastatin 20 MG  tablet Commonly known as: LIPITOR Take 1 tablet (20 mg total) by mouth every evening.   AURYXIA PO Take 220 mg by mouth 3 (three) times daily.   blood glucose meter kit and supplies Kit Please dispense either One Touch or Bayer Contour She must have one of these machines since she is on peritoneal dialysis. Use up to four times daily as directed. (FOR ICD-9 250.00, 250.01).   clopidogrel 75 MG tablet Commonly known as: PLAVIX TAKE 1 TABLET BY MOUTH EVERY DAY   colchicine 0.6 MG tablet Take 0.6 mg by mouth daily as needed (gout).   Eliquis 2.5 MG Tabs tablet Generic drug: apixaban TAKE 1 TABLET BY MOUTH TWICE A DAY What changed: how much to take   HYDROcodone-acetaminophen 5-325 MG tablet Commonly known as: Norco Take 1 tablet by mouth every 6 (six) hours as needed for moderate pain or severe pain.   metoprolol tartrate 25 MG tablet Commonly known as: LOPRESSOR Take 0.5 tablets (12.5 mg total) by mouth 2 (two) times daily.   mupirocin ointment 2 % Commonly known as: Bactroban Apply 1 application topically daily.   Tylenol 8 Hour Arthritis Pain 650 MG CR tablet Generic drug: acetaminophen Take 650 mg every 8 (eight) hours as needed by mouth for pain.        Follow-up Information    Schnier, Dolores Lory, MD Follow up in 1 month(s).   Specialties: Vascular Surgery, Cardiology, Radiology, Vascular Surgery Why: Can see Schnier  or Arna Medici. Will need HDA with visit.  Contact information: [place] [address] [phone]        Elby Beck, FNP. Schedule an appointment as soon as possible for a visit in 1 week(s).   Specialties: Nurse Practitioner, Family Medicine Contact information: [place] [address] [phone]        Nelva Bush, MD .   Specialty: Cardiology Contact information: [place] [address] [phone]               No Known Allergies   The results of  significant diagnostics from this hospitalization (including imaging, microbiology, ancillary and laboratory) are listed below for reference.   Consultations:   Procedures/Studies: PERIPHERAL VASCULAR CATHETERIZATION  Result Date: 03/12/2020 See Op Note  PERIPHERAL VASCULAR CATHETERIZATION  Result Date: 03/11/2020 See op note  DG Chest Port 1 View  Result Date: 03/11/2020 CLINICAL DATA:  86 year old female with history of hyperkalemia. EXAM: PORTABLE CHEST 1 VIEW COMPARISON:  Chest x-ray 01/18/2019. FINDINGS: Lung volumes are normal. No consolidative airspace disease. No pleural effusions. No evidence of pulmonary edema. No pneumothorax. Mild cardiomegaly. Upper mediastinal contours are within normal limits. Aortic atherosclerosis. Vascular stent projecting over the right upper extremity near the axilla. IMPRESSION: 1. No radiographic evidence of acute cardiopulmonary disease. 2. Cardiomegaly. 3. Aortic atherosclerosis. Electronically Signed   By: Vinnie Langton M.D.   On: 03/11/2020 14:36   VAS US DUPLEX DIALYSIS ACCESS (AVF, AVG)  Result Date: 03/10/2020 DIALYSIS ACCESS Access Site: Right Upper Extremity. Access Type: Brach Axillary AVG. History: Right BrachAx AVG created 09/13/2018. Comparison Study: 02/20/2020 Performing Technologist: Almira Coaster RVS  Examination Guidelines: A complete evaluation includes B-mode imaging, spectral Doppler, color Doppler, and power Doppler as needed of all accessible portions of each vessel. Unilateral testing is considered an integral part of a complete examination. Limited examinations for reoccurring indications may be performed as noted.  Findings:   +--------------------+----------+-----------------+--------+ AVG                 PSV (cm/s)Flow Vol (mL/min)Describe +--------------------+----------+-----------------+--------+ Native artery inflow    70           133                +--------------------+----------+-----------------+--------+  Arterial anastomosis    50                              +--------------------+----------+-----------------+--------+ Prox graft              0                      occluded +--------------------+----------+-----------------+--------+ Mid graft               0                      occluded +--------------------+----------+-----------------+--------+ Distal graft            0                      occluded +--------------------+----------+-----------------+--------+ Venous anastomosis      0                      occluded +--------------------+----------+-----------------+--------+ Venous outflow          31                              +--------------------+----------+-----------------+--------+ +---------------+-------------+---------+---------+----------+-----------------+  Diameter (cm)  Depth  BranchingPSV (cm/s)   Flow Volume                                  (cm)                          (ml/min)      +---------------+-------------+---------+---------+----------+-----------------+ Right Radial                                      90                      Art                                                                       +---------------+-------------+---------+---------+----------+-----------------+  Summary: The Right Upper Arm AVG Brachial Axillary AVG appears to be occluded; Abnormal Flow Volume seen as well. A Hematoma was seen in the Distal Upper Arm area medially measuring 3.39cms x 1.30 cms.  *See table(s) above for measurements and observations.  Diagnosing physician: Hortencia Pilar MD Electronically signed by Hortencia Pilar MD on 03/10/2020 at 5:06:22 PM.    --------------------------------------------------------------------------------   Final    VAS [place] (AVF, AVG)  Result Date: 03/10/2020 DIALYSIS ACCESS Reason for Exam: Routine follow up. Access Site: Right Upper Extremity. Access Type: Brachial /  Axial. History: Right BrachAx AVG created 09/13/2018. Comparison Study: 11/07/2019 Performing Technologist: Charlane Ferretti RT (R)(VS)  Examination Guidelines: A complete evaluation includes B-mode imaging, spectral Doppler, color Doppler, and power Doppler as needed of all accessible portions of each vessel. Unilateral testing is considered an integral part of a complete examination. Limited examinations for reoccurring indications may be performed as noted.  Findings:  +--------------------+----------+-----------------+--------+ AVG                 PSV (cm/s)Flow Vol (mL/min)Describe +--------------------+----------+-----------------+--------+ Native artery inflow   280           987                +--------------------+----------+-----------------+--------+ Arterial anastomosis   433                              +--------------------+----------+-----------------+--------+ Prox graft             270                              +--------------------+----------+-----------------+--------+ Mid graft              335                              +--------------------+----------+-----------------+--------+ Distal graft           570                              +--------------------+----------+-----------------+--------+  Venous anastomosis     241                              +--------------------+----------+-----------------+--------+ Venous outflow         109                              +--------------------+----------+-----------------+--------+ +---------------+-------------+---------+---------+----------+-----------------+                Diameter (cm)  Depth  BranchingPSV (cm/s)   Flow Volume                                  (cm)                          (ml/min)      +---------------+-------------+---------+---------+----------+-----------------+ Right Radial                                      62                      artery                                                                     +---------------+-------------+---------+---------+----------+-----------------+ Summary: Patent arteriovenous graft. *See table(s) above for measurements and observations.  Diagnosing physician: Hortencia Pilar MD Electronically signed by Hortencia Pilar MD on 03/10/2020 at 2:50:35 PM.   --------------------------------------------------------------------------------   Final       Labs: BNP (last 3 results) No results for input(s): BNP in the last 8760 hours. Basic Metabolic Panel: Recent Labs  Lab 03/11/20 1320 03/11/20 1616 03/12/20 0601  NA  --  137 138  K 5.7* 4.0 4.1  CL  --  98 99  CO2  --  27 27  GLUCOSE  --  95 84  BUN  --  64* 36*  CREATININE  --  6.50* 5.59*  CALCIUM  --  8.8* 8.9  MG  --  2.0  --    Liver Function Tests: No results for input(s): AST, ALT, ALKPHOS, BILITOT, PROT, ALBUMIN in the last 168 hours. No results for input(s): LIPASE, AMYLASE in the last 168 hours. No results for input(s): AMMONIA in the last 168 hours. CBC: Recent Labs  Lab 03/11/20 1616 03/12/20 0601  WBC 7.6 6.8  HGB 9.0* 8.6*  HCT 27.8* 26.9*  MCV 98.2 99.6  PLT 300 283   Cardiac Enzymes: No results for input(s): CKTOTAL, CKMB, CKMBINDEX, TROPONINI in the last 168 hours. BNP: Invalid input(s): POCBNP CBG: Recent Labs  Lab 03/11/20 1250  GLUCAP 96   D-Dimer No results for input(s): DDIMER in the last 72 hours. Hgb A1c No results for input(s): HGBA1C in the last 72 hours. Lipid Profile No results for input(s): CHOL, HDL, LDLCALC, TRIG, CHOLHDL, LDLDIRECT in the last 72 hours. Thyroid function studies No results for input(s): TSH, T4TOTAL, T3FREE, THYROIDAB in the last 72 hours.  Invalid input(s): FREET3 Anemia work up No  results for input(s): VITAMINB12, FOLATE, FERRITIN, TIBC, IRON, RETICCTPCT in the last 72 hours. Urinalysis No results found for: COLORURINE, APPEARANCEUR, [place], [place], GLUCOSEU, [place], [place],  [place], PROTEINUR, UROBILINOGEN, NITRITE, LEUKOCYTESUR Sepsis Labs Invalid input(s): PROCALCITONIN,  WBC,  LACTICIDVEN Microbiology Recent Results (from the past 240 hour(s))  SARS CORONAVIRUS 2 (TAT 6-24 HRS) Nasopharyngeal Nasopharyngeal Swab     Status: None   Collection Time: 03/10/20 12:25 PM   Specimen: Nasopharyngeal Swab  Result Value Ref Range Status   SARS Coronavirus 2 NEGATIVE NEGATIVE Final    Comment: (NOTE) SARS-CoV-2 target nucleic acids are NOT DETECTED.  The SARS-CoV-2 RNA is generally detectable in upper and lower respiratory specimens during the acute phase of infection. Negative results do not preclude SARS-CoV-2 infection, do not rule out co-infections with other pathogens, and should not be used as the sole basis for treatment or other patient management decisions. Negative results must be combined with clinical observations, patient history, and epidemiological information. The expected result is Negative.  Fact Sheet for Patients: SugarRoll.be  Fact Sheet for Healthcare Providers: https://www.woods-mathews.com/  This test is not yet approved or cleared by the [place] FDA and  has been authorized for detection and/or diagnosis of SARS-CoV-2 by FDA under an Emergency Use Authorization (EUA). This EUA will remain  in effect (meaning this test can be used) for the duration of the COVID-19 declaration under Se ction 564(b)(1) of the Act, 21 U.S.C. section 360bbb-3(b)(1), unless the authorization is terminated or revoked sooner.  Performed at [facility], [place] [address], [place], [place] [address]      Total time spend on discharging this patient, including the last patient exam, discussing the hospital stay, instructions for ongoing care as it relates to all pertinent caregivers, as well as preparing the medical discharge records, prescriptions, and/or referrals as applicable, is 30  minutes.    Enzo Bi, MD  [facility] 03/12/2020, 4:10 PM  If 7PM-7AM, please contact night-coverage

## 2020-03-12 NOTE — Progress Notes (Signed)
Daughter [name] updated on pt status. Daughter asked if pt would be d/c today. Advised daughter doubtful but possible.

## 2020-03-12 NOTE — Care Management Obs Status (Signed)
[facility] NOTIFICATION   Patient Details  Name: Emmely Bittinger MRN: [id number] Date of Birth: [birth year]   Medicare Observation Status Notification Given:  Yes (Sending home with patient for daughter to review.)    Candie Chroman, LCSW 03/12/2020, 4:23 PM

## 2020-03-12 NOTE — Progress Notes (Signed)
Stacy Pham  A and O x 4 VSS. Pt tolerating diet well. No complaints of pain or nausea. IV removed intact, prescriptions given. Pt voices understanding of discharge instructions with no further questions. Pt discharged via wheelchair with axillary.   Allergies as of 03/12/2020   No Known Allergies     Medication List    STOP taking these medications   ciprofloxacin 250 MG tablet Commonly known as: CIPRO     TAKE these medications   allopurinol 100 MG tablet Commonly known as: ZYLOPRIM TAKE 1 TABLET BY MOUTH EVERYDAY AT BEDTIME   amiodarone 200 MG tablet Commonly known as: PACERONE Take 1 tablet (200 mg total) by mouth every morning. Take 1 tablet (200 mg) by mouth once daily What changed: additional instructions   amLODipine 5 MG tablet Commonly known as: NORVASC Take 1 tablet (5 mg total) by mouth daily.   atorvastatin 20 MG tablet Commonly known as: LIPITOR Take 1 tablet (20 mg total) by mouth every evening.   AURYXIA PO Take 220 mg by mouth 3 (three) times daily.   blood glucose meter kit and supplies Kit Please dispense either One Touch or Bayer Contour She must have one of these machines since she is on peritoneal dialysis. Use up to four times daily as directed. (FOR ICD-9 250.00, 250.01).   clopidogrel 75 MG tablet Commonly known as: PLAVIX TAKE 1 TABLET BY MOUTH EVERY DAY   colchicine 0.6 MG tablet Take 0.6 mg by mouth daily as needed (gout).   Eliquis 2.5 MG Tabs tablet Generic drug: apixaban TAKE 1 TABLET BY MOUTH TWICE A DAY What changed: how much to take   HYDROcodone-acetaminophen 5-325 MG tablet Commonly known as: Norco Take 1 tablet by mouth every 6 (six) hours as needed for moderate pain or severe pain.   metoprolol tartrate 25 MG tablet Commonly known as: LOPRESSOR Take 0.5 tablets (12.5 mg total) by mouth 2 (two) times daily.   mupirocin ointment 2 % Commonly known as: Bactroban Apply 1 application topically daily.   Tylenol 8 Hour  Arthritis Pain 650 MG CR tablet Generic drug: acetaminophen Take 650 mg every 8 (eight) hours as needed by mouth for pain.       Vitals:   03/12/20 1530 03/12/20 1545  BP: (!) 159/57 (!) 155/94  Pulse: (!) 59 (!) 49  Resp: 16 16  Temp:    SpO2: 94% 99%    Isaiah Serge

## 2020-03-13 ENCOUNTER — Encounter: Payer: Self-pay | Admitting: Vascular Surgery

## 2020-03-15 DIAGNOSIS — N2581 Secondary hyperparathyroidism of renal origin: Secondary | ICD-10-CM | POA: Diagnosis not present

## 2020-03-15 DIAGNOSIS — N186 End stage renal disease: Secondary | ICD-10-CM | POA: Diagnosis not present

## 2020-03-15 DIAGNOSIS — D509 Iron deficiency anemia, unspecified: Secondary | ICD-10-CM | POA: Diagnosis not present

## 2020-03-15 DIAGNOSIS — D631 Anemia in chronic kidney disease: Secondary | ICD-10-CM | POA: Diagnosis not present

## 2020-03-15 DIAGNOSIS — Z992 Dependence on renal dialysis: Secondary | ICD-10-CM | POA: Diagnosis not present

## 2020-03-18 DIAGNOSIS — N2581 Secondary hyperparathyroidism of renal origin: Secondary | ICD-10-CM | POA: Diagnosis not present

## 2020-03-18 DIAGNOSIS — D509 Iron deficiency anemia, unspecified: Secondary | ICD-10-CM | POA: Diagnosis not present

## 2020-03-18 DIAGNOSIS — N186 End stage renal disease: Secondary | ICD-10-CM | POA: Diagnosis not present

## 2020-03-18 DIAGNOSIS — Z992 Dependence on renal dialysis: Secondary | ICD-10-CM | POA: Diagnosis not present

## 2020-03-18 DIAGNOSIS — D631 Anemia in chronic kidney disease: Secondary | ICD-10-CM | POA: Diagnosis not present

## 2020-03-20 DIAGNOSIS — D509 Iron deficiency anemia, unspecified: Secondary | ICD-10-CM | POA: Diagnosis not present

## 2020-03-20 DIAGNOSIS — N186 End stage renal disease: Secondary | ICD-10-CM | POA: Diagnosis not present

## 2020-03-20 DIAGNOSIS — N2581 Secondary hyperparathyroidism of renal origin: Secondary | ICD-10-CM | POA: Diagnosis not present

## 2020-03-20 DIAGNOSIS — Z992 Dependence on renal dialysis: Secondary | ICD-10-CM | POA: Diagnosis not present

## 2020-03-20 DIAGNOSIS — D631 Anemia in chronic kidney disease: Secondary | ICD-10-CM | POA: Diagnosis not present

## 2020-03-21 ENCOUNTER — Telehealth: Payer: Self-pay

## 2020-03-21 ENCOUNTER — Telehealth (INDEPENDENT_AMBULATORY_CARE_PROVIDER_SITE_OTHER): Payer: Self-pay

## 2020-03-21 ENCOUNTER — Inpatient Hospital Stay: Payer: Medicare Other | Admitting: Family Medicine

## 2020-03-21 ENCOUNTER — Other Ambulatory Visit (INDEPENDENT_AMBULATORY_CARE_PROVIDER_SITE_OTHER): Payer: Self-pay | Admitting: Nurse Practitioner

## 2020-03-21 NOTE — Telephone Encounter (Signed)
Stacy Pham pts daughter (DPR signed) called to cancel appt with Glenda Chroman FNP today at 27 AM.pt having excruciating pain on rt side of abd that is radiating down rt leg. Pt was scheduled for 6 mth visit and Hospital FU after opening lt groin for access for dialysis;   I spoke with Glenda Chroman FNP and she said pt needs to have face to face visit either here at 11 AM or call EMS and take pt to ED. Stacy Pham notified as instructed and voiced understanding. [name] said to cancel appt with Glenda Chroman FNP and was going to continue to monitor pt and if needs to go to ED she will call EMS. I advise Stacy Pham that the wait at  ED could be several hrs but she said to cancel appt with D Gessner FNP;appt cancelled as instructed. FYI to Glenda Chroman FNP.

## 2020-03-21 NOTE — Telephone Encounter (Signed)
The patient did not have any intervention in her left groin.  She had a perm cath placed in her right.  When we worked on the graft, we accessed in the arm. It is possible that the pain is not vascular related.  Following with your PCP would be a good idea to have that evaluated.  As far as her foot, it is concerning that her pain has increased to where her current pain medication isn't working.  We should bring her in for ABIs with GS to see if something has changed.

## 2020-03-21 NOTE — Telephone Encounter (Signed)
Noted  

## 2020-03-24 ENCOUNTER — Other Ambulatory Visit: Payer: Self-pay | Admitting: Family Medicine

## 2020-03-24 ENCOUNTER — Other Ambulatory Visit: Payer: Self-pay

## 2020-03-24 ENCOUNTER — Other Ambulatory Visit (INDEPENDENT_AMBULATORY_CARE_PROVIDER_SITE_OTHER): Payer: Self-pay | Admitting: Nurse Practitioner

## 2020-03-24 ENCOUNTER — Ambulatory Visit (INDEPENDENT_AMBULATORY_CARE_PROVIDER_SITE_OTHER): Payer: Medicare Other

## 2020-03-24 DIAGNOSIS — M79673 Pain in unspecified foot: Secondary | ICD-10-CM | POA: Diagnosis not present

## 2020-03-24 DIAGNOSIS — I480 Paroxysmal atrial fibrillation: Secondary | ICD-10-CM

## 2020-03-24 DIAGNOSIS — R1032 Left lower quadrant pain: Secondary | ICD-10-CM

## 2020-03-24 NOTE — Progress Notes (Deleted)
Cardiology Office Note    Date:  03/24/2020   ID:  Stacy Pham, DOB 04/06/1931, MRN [id number]  PCP:  Elby Beck, FNP  Cardiologist:  Nelva Bush, MD  Electrophysiologist:  None   Chief Complaint: Follow up  History of Present Illness:   Stacy Pham is a 86 y.o. female with history of PAF on Eliquis, PAD s/p left great toe amputation in 12/2019 followed by vascular surgery, ESRD on HD (TTS), HFpEF, anemia of chronic disease, DM2, HTN, and gout who presents for follow up of ***.   She has been managed on Eliquis since diagnosis of A. fib in 2018.  She was previously on hemodialysis though transitioned to PD in 08/2017.  Echo from 10/2017 showed an EF of 60 to 68%, grade 1 diastolic dysfunction, mild MR, PASP 40 mmHg.  More recently, it was recommended she transition back to hemodialysis by her nephrologist.  She was scheduled to undergo AV fistula placement in 07/2018 though upon presentation she was noted to be in A. fib with RVR and hypertensive.  Case was canceled and she was seen in the PACU.  Her carvedilol was transitioned to metoprolol.  Of note, the patient was asymptomatic in this setting.  She subsequently underwent AV fistula placement on 09/13/2018.  She was admitted to [facility] in early 01/2019 for gout flare of the foot. Initial potassium was elevated at 7.3. CXR showed pulmonary vascular congestion with suspected degree of volume overload. She was treated by IM and underwent HD.  It appears that she maintains sinus rhythm during the admission.  She was last seen in the office in 01/2019 for follow up and was doing well from a cardiac perspective. Since she has been seen, she has undergone a left great toe amputation in 12/2019, and more recently a thrombectomy of the AV fistula on 03/12/2020. Most recent lower extremity noninvasive imaging from 03/24/2020 showed mild right lower extremity arterial disease and moderate left lower extremity arterial disease.  ***   Labs  independently reviewed: 03/2020 - Hgb 8.6, PLT 283, potassium 4.1, BUN 36, serum creatinine 5.59 02/2020 - magnesium 2.0 09/2019 - A1c 5.7, albumin 3.6, AST/ALT normal 04/2018 - TC 179, TG 108, HDL 46, LDL 111 02/2018 - TSH normal  Past Medical History:  Diagnosis Date  . (HFpEF) heart failure with preserved ejection fraction ([place])    a. 10/2017 Echo: EF 60-65%, Gr1 DD, mild MR, mildly dil LA/RA. PASP 14mmHg.  . Arthritis   . Back pain   . Bronchitis   . Cataract   . Diabetes mellitus without complication (HCC)    no meds currently  . Dialysis patient ([place])   . Dysrhythmia   . ESRD (end stage renal disease) ([place])    a. 2018 - initially on HD but then transitioned to nightly PD in 08/2017.  [name] Gout   . History of methicillin resistant staphylococcus aureus (MRSA)   . Hypertension   . PAF (paroxysmal atrial fibrillation) (HCC)    a. CHA2DS2VASc = 5-->eliquis 2.5 BID.    Past Surgical History:  Procedure Laterality Date  . A/V FISTULAGRAM Right 11/13/2019   Procedure: A/V FISTULAGRAM;  Surgeon: Katha Cabal, MD;  Location: [facility];  Service: Cardiovascular;  Laterality: Right;  . AMPUTATION Left 01/09/2020   Procedure: AMPUTATION RAY ( GREAT TOE );  Surgeon: Katha Cabal, MD;  Location: [facility];  Service: Vascular;  Laterality: Left;  . AV FISTULA PLACEMENT Right 09/13/2018   Procedure: INSERTION OF  ARTERIOVENOUS (AV) GORE-TEX GRAFT ARM;  Surgeon: Katha Cabal, MD;  Location: [facility];  Service: Vascular;  Laterality: Right;  . CAPD INSERTION N/A 06/08/2017   Procedure: LAPAROSCOPIC INSERTION CONTINUOUS AMBULATORY PERITONEAL DIALYSIS  (CAPD) CATHETER;  Surgeon: Katha Cabal, MD;  Location: [facility];  Service: Vascular;  Laterality: N/A;  . CATARACT EXTRACTION, BILATERAL Bilateral   . DIALYSIS/PERMA CATHETER INSERTION N/A 03/18/2017   Procedure: DIALYSIS/PERMA CATHETER INSERTION;  Surgeon: Katha Cabal, MD;  Location: [facility];  Service:  Cardiovascular;  Laterality: N/A;  . DIALYSIS/PERMA CATHETER REMOVAL N/A 10/20/2017   Procedure: DIALYSIS/PERMA CATHETER REMOVAL;  Surgeon: Algernon Huxley, MD;  Location: [place] [facility];  Service: Cardiovascular;  Laterality: N/A;  . EYE SURGERY    . LOWER EXTREMITY ANGIOGRAPHY Left 09/11/2019   Procedure: LOWER EXTREMITY ANGIOGRAPHY;  Surgeon: Katha Cabal, MD;  Location: [facility];  Service: Cardiovascular;  Laterality: Left;  . LOWER EXTREMITY ANGIOGRAPHY Right 10/17/2019   Procedure: LOWER EXTREMITY ANGIOGRAPHY;  Surgeon: Katha Cabal, MD;  Location: [facility] [facility];  Service: Cardiovascular;  Laterality: Right;  . LOWER EXTREMITY ANGIOGRAPHY Left 11/20/2019   Procedure: LOWER EXTREMITY ANGIOGRAPHY;  Surgeon: Katha Cabal, MD;  Location: [place] [facility];  Service: Cardiovascular;  Laterality: Left;  . PERIPHERAL VASCULAR THROMBECTOMY Right 03/12/2020   Procedure: PERIPHERAL VASCULAR THROMBECTOMY;  Surgeon: Katha Cabal, MD;  Location: [place] [facility];  Service: Cardiovascular;  Laterality: Right;  . REMOVAL OF A DIALYSIS CATHETER N/A 02/23/2019   Procedure: REMOVAL OF A DIALYSIS CATHETER ( PD CATH);  Surgeon: Katha Cabal, MD;  Location: [facility];  Service: Vascular;  Laterality: N/A;  . TEMPORARY DIALYSIS CATHETER  03/11/2020   Procedure: TEMPORARY DIALYSIS CATHETER;  Surgeon: Katha Cabal, MD;  Location: [facility];  Service: Cardiovascular;;    Current Medications: No outpatient medications have been marked as taking for the 03/28/20 encounter (Appointment) with [name], PA-C.    Allergies:   Patient has no known allergies.   Social History   Socioeconomic History  . Marital status: Single    Spouse name: Not on file  . Number of children: 3  . Years of education: Not on file  . Highest education level: Not on file  Occupational History  . Not on file  Tobacco Use  . Smoking status: Never Smoker  .  Smokeless tobacco: Never Used  Vaping Use  . Vaping Use: Never used  Substance and Sexual Activity  . Alcohol use: No  . Drug use: No  . Sexual activity: Not on file  Other Topics Concern  . Not on file  Social History Narrative   Retired. Lives in [place] with her dtr who takes care of her.  Pt uses a walker to get around, though admits that ambulation over all is limited.   Social Determinants of Health   Financial Resource Strain: Low Risk   . Difficulty of Paying Living Expenses: Not hard at all  Food Insecurity: No Food Insecurity  . Worried About Charity fundraiser in the Last Year: Never true  . Ran Out of Food in the Last Year: Never true  Transportation Needs: No Transportation Needs  . Lack of Transportation (Medical): No  . Lack of Transportation (Non-Medical): No  Physical Activity: Inactive  . Days of Exercise per Week: 0 days  . Minutes of Exercise per Session: 0 min  Stress: No Stress Concern Present  .  Feeling of Stress : Not at all  Social Connections:   . Frequency of Communication with Friends and Family: Not on file  . Frequency of Social Gatherings with Friends and Family: Not on file  . Attends Religious Services: Not on file  . Active Member of Clubs or Organizations: Not on file  . Attends Archivist Meetings: Not on file  . Marital Status: Not on file     Family History:  The patient's family history includes Hypertension in her father.  ROS:   ROS   EKGs/Labs/Other Studies Reviewed:    Studies reviewed were summarized above. The additional studies were reviewed today: As above.   EKG:  EKG is ordered today.  The EKG ordered today demonstrates ***  Recent Labs: 09/14/2019: ALT 4 03/11/2020: Magnesium 2.0 03/12/2020: BUN 36; Creatinine, Ser 5.59; Hemoglobin 8.6; Platelets 283; Potassium 4.1; Sodium 138  Recent Lipid Panel    Component Value Date/Time   CHOL 179 05/03/2018 1022   TRIG 108 05/03/2018 1022   HDL 46 05/03/2018  1022   CHOLHDL 3.9 05/03/2018 1022   LDLCALC 111 (H) 05/03/2018 1022    PHYSICAL EXAM:    VS:  There were no vitals taken for this visit.  BMI: There is no height or weight on file to calculate BMI.  Physical Exam  Wt Readings from Last 3 Encounters:  03/12/20 150 lb (68 kg)  03/10/20 158 lb 9.6 oz (71.9 kg)  02/20/20 155 lb (70.3 kg)     ASSESSMENT & PLAN:   1. PAF:  2. HFpEF:  3. ESRD:  4. HTN: Blood pressure ***  5. Anemia of chronic disease:  6. PAD: Followed by vascular surgery.   Disposition: F/u with Dr. Saunders Revel or an APP in ***.   Medication Adjustments/Labs and Tests Ordered: Current medicines are reviewed at length with the patient today.  Concerns regarding medicines are outlined above. Medication changes, Labs and Tests ordered today are summarized above and listed in the Patient Instructions accessible in Encounters.   Signed, Christell Faith, PA-C 03/24/2020 3:54 PM     [address] [place], [place] [address] [phone]

## 2020-03-25 ENCOUNTER — Telehealth: Payer: Self-pay

## 2020-03-25 DIAGNOSIS — Z992 Dependence on renal dialysis: Secondary | ICD-10-CM | POA: Diagnosis not present

## 2020-03-25 DIAGNOSIS — N186 End stage renal disease: Secondary | ICD-10-CM | POA: Diagnosis not present

## 2020-03-25 DIAGNOSIS — N2581 Secondary hyperparathyroidism of renal origin: Secondary | ICD-10-CM | POA: Diagnosis not present

## 2020-03-25 DIAGNOSIS — D509 Iron deficiency anemia, unspecified: Secondary | ICD-10-CM | POA: Diagnosis not present

## 2020-03-25 DIAGNOSIS — D631 Anemia in chronic kidney disease: Secondary | ICD-10-CM | POA: Diagnosis not present

## 2020-03-25 MED ORDER — METOPROLOL TARTRATE 25 MG PO TABS
12.5000 mg | ORAL_TABLET | Freq: Two times a day (BID) | ORAL | 0 refills | Status: DC
Start: 2020-03-25 — End: 2020-06-16

## 2020-03-25 NOTE — Telephone Encounter (Signed)
Daughter says her mother ran out of BP meds. Pharmacy requested a refill through doctor/ Pharmacy has not rec a response. Please contact pharmacy

## 2020-03-27 DIAGNOSIS — D631 Anemia in chronic kidney disease: Secondary | ICD-10-CM | POA: Diagnosis not present

## 2020-03-27 DIAGNOSIS — Z992 Dependence on renal dialysis: Secondary | ICD-10-CM | POA: Diagnosis not present

## 2020-03-27 DIAGNOSIS — D509 Iron deficiency anemia, unspecified: Secondary | ICD-10-CM | POA: Diagnosis not present

## 2020-03-27 DIAGNOSIS — N2581 Secondary hyperparathyroidism of renal origin: Secondary | ICD-10-CM | POA: Diagnosis not present

## 2020-03-27 DIAGNOSIS — N186 End stage renal disease: Secondary | ICD-10-CM | POA: Diagnosis not present

## 2020-03-28 ENCOUNTER — Ambulatory Visit: Payer: Medicare Other | Admitting: Physician Assistant

## 2020-03-29 DIAGNOSIS — N2581 Secondary hyperparathyroidism of renal origin: Secondary | ICD-10-CM | POA: Diagnosis not present

## 2020-03-29 DIAGNOSIS — Z992 Dependence on renal dialysis: Secondary | ICD-10-CM | POA: Diagnosis not present

## 2020-03-29 DIAGNOSIS — D631 Anemia in chronic kidney disease: Secondary | ICD-10-CM | POA: Diagnosis not present

## 2020-03-29 DIAGNOSIS — N186 End stage renal disease: Secondary | ICD-10-CM | POA: Diagnosis not present

## 2020-03-29 DIAGNOSIS — D509 Iron deficiency anemia, unspecified: Secondary | ICD-10-CM | POA: Diagnosis not present

## 2020-04-01 ENCOUNTER — Other Ambulatory Visit: Payer: Self-pay | Admitting: Family Medicine

## 2020-04-01 DIAGNOSIS — E1122 Type 2 diabetes mellitus with diabetic chronic kidney disease: Secondary | ICD-10-CM

## 2020-04-01 DIAGNOSIS — Z992 Dependence on renal dialysis: Secondary | ICD-10-CM | POA: Diagnosis not present

## 2020-04-01 DIAGNOSIS — D509 Iron deficiency anemia, unspecified: Secondary | ICD-10-CM | POA: Diagnosis not present

## 2020-04-01 DIAGNOSIS — N2581 Secondary hyperparathyroidism of renal origin: Secondary | ICD-10-CM | POA: Diagnosis not present

## 2020-04-01 DIAGNOSIS — N186 End stage renal disease: Secondary | ICD-10-CM | POA: Diagnosis not present

## 2020-04-01 DIAGNOSIS — D631 Anemia in chronic kidney disease: Secondary | ICD-10-CM | POA: Diagnosis not present

## 2020-04-01 NOTE — Telephone Encounter (Signed)
No upcoming appt. Pt canceled appt that was schedule for 03/21/2020. No lipid panel blood test w/in last 6 months. Last medication refill 01/01/2020 #90

## 2020-04-03 DIAGNOSIS — N186 End stage renal disease: Secondary | ICD-10-CM | POA: Diagnosis not present

## 2020-04-03 DIAGNOSIS — D631 Anemia in chronic kidney disease: Secondary | ICD-10-CM | POA: Diagnosis not present

## 2020-04-03 DIAGNOSIS — D509 Iron deficiency anemia, unspecified: Secondary | ICD-10-CM | POA: Diagnosis not present

## 2020-04-03 DIAGNOSIS — N2581 Secondary hyperparathyroidism of renal origin: Secondary | ICD-10-CM | POA: Diagnosis not present

## 2020-04-03 DIAGNOSIS — Z992 Dependence on renal dialysis: Secondary | ICD-10-CM | POA: Diagnosis not present

## 2020-04-05 DIAGNOSIS — N186 End stage renal disease: Secondary | ICD-10-CM | POA: Diagnosis not present

## 2020-04-05 DIAGNOSIS — D509 Iron deficiency anemia, unspecified: Secondary | ICD-10-CM | POA: Diagnosis not present

## 2020-04-05 DIAGNOSIS — Z992 Dependence on renal dialysis: Secondary | ICD-10-CM | POA: Diagnosis not present

## 2020-04-05 DIAGNOSIS — D631 Anemia in chronic kidney disease: Secondary | ICD-10-CM | POA: Diagnosis not present

## 2020-04-05 DIAGNOSIS — N2581 Secondary hyperparathyroidism of renal origin: Secondary | ICD-10-CM | POA: Diagnosis not present

## 2020-04-08 DIAGNOSIS — Z992 Dependence on renal dialysis: Secondary | ICD-10-CM | POA: Diagnosis not present

## 2020-04-08 DIAGNOSIS — N2581 Secondary hyperparathyroidism of renal origin: Secondary | ICD-10-CM | POA: Diagnosis not present

## 2020-04-08 DIAGNOSIS — D509 Iron deficiency anemia, unspecified: Secondary | ICD-10-CM | POA: Diagnosis not present

## 2020-04-08 DIAGNOSIS — D631 Anemia in chronic kidney disease: Secondary | ICD-10-CM | POA: Diagnosis not present

## 2020-04-08 DIAGNOSIS — N186 End stage renal disease: Secondary | ICD-10-CM | POA: Diagnosis not present

## 2020-04-10 DIAGNOSIS — N2581 Secondary hyperparathyroidism of renal origin: Secondary | ICD-10-CM | POA: Diagnosis not present

## 2020-04-10 DIAGNOSIS — Z992 Dependence on renal dialysis: Secondary | ICD-10-CM | POA: Diagnosis not present

## 2020-04-10 DIAGNOSIS — D509 Iron deficiency anemia, unspecified: Secondary | ICD-10-CM | POA: Diagnosis not present

## 2020-04-10 DIAGNOSIS — D631 Anemia in chronic kidney disease: Secondary | ICD-10-CM | POA: Diagnosis not present

## 2020-04-10 DIAGNOSIS — N186 End stage renal disease: Secondary | ICD-10-CM | POA: Diagnosis not present

## 2020-04-12 DIAGNOSIS — N2581 Secondary hyperparathyroidism of renal origin: Secondary | ICD-10-CM | POA: Diagnosis not present

## 2020-04-12 DIAGNOSIS — N186 End stage renal disease: Secondary | ICD-10-CM | POA: Diagnosis not present

## 2020-04-12 DIAGNOSIS — Z992 Dependence on renal dialysis: Secondary | ICD-10-CM | POA: Diagnosis not present

## 2020-04-12 DIAGNOSIS — D509 Iron deficiency anemia, unspecified: Secondary | ICD-10-CM | POA: Diagnosis not present

## 2020-04-12 DIAGNOSIS — D631 Anemia in chronic kidney disease: Secondary | ICD-10-CM | POA: Diagnosis not present

## 2020-04-15 DIAGNOSIS — D509 Iron deficiency anemia, unspecified: Secondary | ICD-10-CM | POA: Diagnosis not present

## 2020-04-15 DIAGNOSIS — N2581 Secondary hyperparathyroidism of renal origin: Secondary | ICD-10-CM | POA: Diagnosis not present

## 2020-04-15 DIAGNOSIS — N186 End stage renal disease: Secondary | ICD-10-CM | POA: Diagnosis not present

## 2020-04-15 DIAGNOSIS — Z992 Dependence on renal dialysis: Secondary | ICD-10-CM | POA: Diagnosis not present

## 2020-04-15 DIAGNOSIS — D631 Anemia in chronic kidney disease: Secondary | ICD-10-CM | POA: Diagnosis not present

## 2020-04-17 DIAGNOSIS — D631 Anemia in chronic kidney disease: Secondary | ICD-10-CM | POA: Diagnosis not present

## 2020-04-17 DIAGNOSIS — N2581 Secondary hyperparathyroidism of renal origin: Secondary | ICD-10-CM | POA: Diagnosis not present

## 2020-04-17 DIAGNOSIS — N186 End stage renal disease: Secondary | ICD-10-CM | POA: Diagnosis not present

## 2020-04-17 DIAGNOSIS — Z992 Dependence on renal dialysis: Secondary | ICD-10-CM | POA: Diagnosis not present

## 2020-04-17 DIAGNOSIS — D509 Iron deficiency anemia, unspecified: Secondary | ICD-10-CM | POA: Diagnosis not present

## 2020-04-22 DIAGNOSIS — N2581 Secondary hyperparathyroidism of renal origin: Secondary | ICD-10-CM | POA: Diagnosis not present

## 2020-04-22 DIAGNOSIS — Z992 Dependence on renal dialysis: Secondary | ICD-10-CM | POA: Diagnosis not present

## 2020-04-22 DIAGNOSIS — N186 End stage renal disease: Secondary | ICD-10-CM | POA: Diagnosis not present

## 2020-04-22 DIAGNOSIS — D509 Iron deficiency anemia, unspecified: Secondary | ICD-10-CM | POA: Diagnosis not present

## 2020-04-22 DIAGNOSIS — D631 Anemia in chronic kidney disease: Secondary | ICD-10-CM | POA: Diagnosis not present

## 2020-04-24 DIAGNOSIS — N186 End stage renal disease: Secondary | ICD-10-CM | POA: Diagnosis not present

## 2020-04-24 DIAGNOSIS — N2581 Secondary hyperparathyroidism of renal origin: Secondary | ICD-10-CM | POA: Diagnosis not present

## 2020-04-24 DIAGNOSIS — Z992 Dependence on renal dialysis: Secondary | ICD-10-CM | POA: Diagnosis not present

## 2020-04-24 DIAGNOSIS — D509 Iron deficiency anemia, unspecified: Secondary | ICD-10-CM | POA: Diagnosis not present

## 2020-04-24 DIAGNOSIS — D631 Anemia in chronic kidney disease: Secondary | ICD-10-CM | POA: Diagnosis not present

## 2020-04-26 DIAGNOSIS — N2581 Secondary hyperparathyroidism of renal origin: Secondary | ICD-10-CM | POA: Diagnosis not present

## 2020-04-26 DIAGNOSIS — D631 Anemia in chronic kidney disease: Secondary | ICD-10-CM | POA: Diagnosis not present

## 2020-04-26 DIAGNOSIS — N186 End stage renal disease: Secondary | ICD-10-CM | POA: Diagnosis not present

## 2020-04-26 DIAGNOSIS — Z992 Dependence on renal dialysis: Secondary | ICD-10-CM | POA: Diagnosis not present

## 2020-04-26 DIAGNOSIS — D509 Iron deficiency anemia, unspecified: Secondary | ICD-10-CM | POA: Diagnosis not present

## 2020-04-29 DIAGNOSIS — D631 Anemia in chronic kidney disease: Secondary | ICD-10-CM | POA: Diagnosis not present

## 2020-04-29 DIAGNOSIS — N186 End stage renal disease: Secondary | ICD-10-CM | POA: Diagnosis not present

## 2020-04-29 DIAGNOSIS — D509 Iron deficiency anemia, unspecified: Secondary | ICD-10-CM | POA: Diagnosis not present

## 2020-04-29 DIAGNOSIS — Z992 Dependence on renal dialysis: Secondary | ICD-10-CM | POA: Diagnosis not present

## 2020-04-29 DIAGNOSIS — N2581 Secondary hyperparathyroidism of renal origin: Secondary | ICD-10-CM | POA: Diagnosis not present

## 2020-05-01 DIAGNOSIS — D631 Anemia in chronic kidney disease: Secondary | ICD-10-CM | POA: Diagnosis not present

## 2020-05-01 DIAGNOSIS — Z992 Dependence on renal dialysis: Secondary | ICD-10-CM | POA: Diagnosis not present

## 2020-05-01 DIAGNOSIS — N2581 Secondary hyperparathyroidism of renal origin: Secondary | ICD-10-CM | POA: Diagnosis not present

## 2020-05-01 DIAGNOSIS — N186 End stage renal disease: Secondary | ICD-10-CM | POA: Diagnosis not present

## 2020-05-01 DIAGNOSIS — D509 Iron deficiency anemia, unspecified: Secondary | ICD-10-CM | POA: Diagnosis not present

## 2020-05-03 DIAGNOSIS — N2581 Secondary hyperparathyroidism of renal origin: Secondary | ICD-10-CM | POA: Diagnosis not present

## 2020-05-03 DIAGNOSIS — N186 End stage renal disease: Secondary | ICD-10-CM | POA: Diagnosis not present

## 2020-05-03 DIAGNOSIS — D631 Anemia in chronic kidney disease: Secondary | ICD-10-CM | POA: Diagnosis not present

## 2020-05-03 DIAGNOSIS — Z992 Dependence on renal dialysis: Secondary | ICD-10-CM | POA: Diagnosis not present

## 2020-05-03 DIAGNOSIS — D509 Iron deficiency anemia, unspecified: Secondary | ICD-10-CM | POA: Diagnosis not present

## 2020-05-06 DIAGNOSIS — Z992 Dependence on renal dialysis: Secondary | ICD-10-CM | POA: Diagnosis not present

## 2020-05-06 DIAGNOSIS — E119 Type 2 diabetes mellitus without complications: Secondary | ICD-10-CM | POA: Diagnosis not present

## 2020-05-06 DIAGNOSIS — D631 Anemia in chronic kidney disease: Secondary | ICD-10-CM | POA: Diagnosis not present

## 2020-05-06 DIAGNOSIS — D509 Iron deficiency anemia, unspecified: Secondary | ICD-10-CM | POA: Diagnosis not present

## 2020-05-06 DIAGNOSIS — N186 End stage renal disease: Secondary | ICD-10-CM | POA: Diagnosis not present

## 2020-05-06 DIAGNOSIS — N2581 Secondary hyperparathyroidism of renal origin: Secondary | ICD-10-CM | POA: Diagnosis not present

## 2020-05-09 ENCOUNTER — Other Ambulatory Visit (INDEPENDENT_AMBULATORY_CARE_PROVIDER_SITE_OTHER): Payer: Self-pay | Admitting: Nurse Practitioner

## 2020-05-09 ENCOUNTER — Other Ambulatory Visit: Payer: Self-pay | Admitting: Family Medicine

## 2020-05-09 ENCOUNTER — Other Ambulatory Visit (INDEPENDENT_AMBULATORY_CARE_PROVIDER_SITE_OTHER): Payer: Self-pay | Admitting: Vascular Surgery

## 2020-05-10 DIAGNOSIS — Z992 Dependence on renal dialysis: Secondary | ICD-10-CM | POA: Diagnosis not present

## 2020-05-10 DIAGNOSIS — D509 Iron deficiency anemia, unspecified: Secondary | ICD-10-CM | POA: Diagnosis not present

## 2020-05-10 DIAGNOSIS — N2581 Secondary hyperparathyroidism of renal origin: Secondary | ICD-10-CM | POA: Diagnosis not present

## 2020-05-10 DIAGNOSIS — N186 End stage renal disease: Secondary | ICD-10-CM | POA: Diagnosis not present

## 2020-05-10 DIAGNOSIS — D631 Anemia in chronic kidney disease: Secondary | ICD-10-CM | POA: Diagnosis not present

## 2020-05-11 DIAGNOSIS — Z992 Dependence on renal dialysis: Secondary | ICD-10-CM | POA: Diagnosis not present

## 2020-05-11 DIAGNOSIS — N186 End stage renal disease: Secondary | ICD-10-CM | POA: Diagnosis not present

## 2020-05-13 DIAGNOSIS — D509 Iron deficiency anemia, unspecified: Secondary | ICD-10-CM | POA: Diagnosis not present

## 2020-05-13 DIAGNOSIS — T82848A Pain from vascular prosthetic devices, implants and grafts, initial encounter: Secondary | ICD-10-CM | POA: Diagnosis not present

## 2020-05-13 DIAGNOSIS — T82898A Other specified complication of vascular prosthetic devices, implants and grafts, initial encounter: Secondary | ICD-10-CM | POA: Diagnosis not present

## 2020-05-13 DIAGNOSIS — N186 End stage renal disease: Secondary | ICD-10-CM | POA: Diagnosis not present

## 2020-05-13 DIAGNOSIS — N2581 Secondary hyperparathyroidism of renal origin: Secondary | ICD-10-CM | POA: Diagnosis not present

## 2020-05-13 DIAGNOSIS — D631 Anemia in chronic kidney disease: Secondary | ICD-10-CM | POA: Diagnosis not present

## 2020-05-13 DIAGNOSIS — Z992 Dependence on renal dialysis: Secondary | ICD-10-CM | POA: Diagnosis not present

## 2020-05-15 ENCOUNTER — Other Ambulatory Visit (INDEPENDENT_AMBULATORY_CARE_PROVIDER_SITE_OTHER): Payer: Self-pay | Admitting: Nurse Practitioner

## 2020-05-15 ENCOUNTER — Other Ambulatory Visit (INDEPENDENT_AMBULATORY_CARE_PROVIDER_SITE_OTHER): Payer: Self-pay | Admitting: Vascular Surgery

## 2020-05-15 DIAGNOSIS — Z992 Dependence on renal dialysis: Secondary | ICD-10-CM | POA: Diagnosis not present

## 2020-05-15 DIAGNOSIS — D631 Anemia in chronic kidney disease: Secondary | ICD-10-CM | POA: Diagnosis not present

## 2020-05-15 DIAGNOSIS — D509 Iron deficiency anemia, unspecified: Secondary | ICD-10-CM | POA: Diagnosis not present

## 2020-05-15 DIAGNOSIS — M79673 Pain in unspecified foot: Secondary | ICD-10-CM

## 2020-05-15 DIAGNOSIS — N186 End stage renal disease: Secondary | ICD-10-CM

## 2020-05-15 DIAGNOSIS — N2581 Secondary hyperparathyroidism of renal origin: Secondary | ICD-10-CM | POA: Diagnosis not present

## 2020-05-15 DIAGNOSIS — T82898A Other specified complication of vascular prosthetic devices, implants and grafts, initial encounter: Secondary | ICD-10-CM | POA: Diagnosis not present

## 2020-05-15 DIAGNOSIS — Z95820 Peripheral vascular angioplasty status with implants and grafts: Secondary | ICD-10-CM

## 2020-05-19 ENCOUNTER — Ambulatory Visit (INDEPENDENT_AMBULATORY_CARE_PROVIDER_SITE_OTHER): Payer: Medicare Other | Admitting: Vascular Surgery

## 2020-05-19 ENCOUNTER — Other Ambulatory Visit: Payer: Self-pay

## 2020-05-19 ENCOUNTER — Ambulatory Visit (INDEPENDENT_AMBULATORY_CARE_PROVIDER_SITE_OTHER): Payer: Medicare Other

## 2020-05-19 DIAGNOSIS — M79672 Pain in left foot: Secondary | ICD-10-CM

## 2020-05-19 DIAGNOSIS — M79673 Pain in unspecified foot: Secondary | ICD-10-CM

## 2020-05-19 DIAGNOSIS — N186 End stage renal disease: Secondary | ICD-10-CM

## 2020-05-19 DIAGNOSIS — M79671 Pain in right foot: Secondary | ICD-10-CM | POA: Diagnosis not present

## 2020-05-19 DIAGNOSIS — Z95820 Peripheral vascular angioplasty status with implants and grafts: Secondary | ICD-10-CM | POA: Diagnosis not present

## 2020-05-20 DIAGNOSIS — D631 Anemia in chronic kidney disease: Secondary | ICD-10-CM | POA: Diagnosis not present

## 2020-05-20 DIAGNOSIS — N2581 Secondary hyperparathyroidism of renal origin: Secondary | ICD-10-CM | POA: Diagnosis not present

## 2020-05-20 DIAGNOSIS — T82898A Other specified complication of vascular prosthetic devices, implants and grafts, initial encounter: Secondary | ICD-10-CM | POA: Diagnosis not present

## 2020-05-20 DIAGNOSIS — D509 Iron deficiency anemia, unspecified: Secondary | ICD-10-CM | POA: Diagnosis not present

## 2020-05-20 DIAGNOSIS — Z992 Dependence on renal dialysis: Secondary | ICD-10-CM | POA: Diagnosis not present

## 2020-05-20 DIAGNOSIS — N186 End stage renal disease: Secondary | ICD-10-CM | POA: Diagnosis not present

## 2020-05-22 DIAGNOSIS — T82898A Other specified complication of vascular prosthetic devices, implants and grafts, initial encounter: Secondary | ICD-10-CM | POA: Diagnosis not present

## 2020-05-22 DIAGNOSIS — D631 Anemia in chronic kidney disease: Secondary | ICD-10-CM | POA: Diagnosis not present

## 2020-05-22 DIAGNOSIS — D509 Iron deficiency anemia, unspecified: Secondary | ICD-10-CM | POA: Diagnosis not present

## 2020-05-22 DIAGNOSIS — N186 End stage renal disease: Secondary | ICD-10-CM | POA: Diagnosis not present

## 2020-05-22 DIAGNOSIS — N2581 Secondary hyperparathyroidism of renal origin: Secondary | ICD-10-CM | POA: Diagnosis not present

## 2020-05-22 DIAGNOSIS — Z992 Dependence on renal dialysis: Secondary | ICD-10-CM | POA: Diagnosis not present

## 2020-05-23 ENCOUNTER — Ambulatory Visit (INDEPENDENT_AMBULATORY_CARE_PROVIDER_SITE_OTHER): Payer: Medicare Other | Admitting: Family Medicine

## 2020-05-23 ENCOUNTER — Other Ambulatory Visit: Payer: Self-pay

## 2020-05-23 ENCOUNTER — Encounter: Payer: Self-pay | Admitting: Family Medicine

## 2020-05-23 VITALS — BP 138/58 | HR 66 | Temp 97.6°F | Ht 63.0 in | Wt 157.0 lb

## 2020-05-23 DIAGNOSIS — E1122 Type 2 diabetes mellitus with diabetic chronic kidney disease: Secondary | ICD-10-CM | POA: Diagnosis not present

## 2020-05-23 DIAGNOSIS — R1030 Lower abdominal pain, unspecified: Secondary | ICD-10-CM

## 2020-05-23 DIAGNOSIS — Z992 Dependence on renal dialysis: Secondary | ICD-10-CM | POA: Diagnosis not present

## 2020-05-23 DIAGNOSIS — I739 Peripheral vascular disease, unspecified: Secondary | ICD-10-CM | POA: Diagnosis not present

## 2020-05-23 DIAGNOSIS — I7025 Atherosclerosis of native arteries of other extremities with ulceration: Secondary | ICD-10-CM

## 2020-05-23 DIAGNOSIS — N186 End stage renal disease: Secondary | ICD-10-CM

## 2020-05-23 LAB — LIPID PANEL
Cholesterol: 138 mg/dL (ref 0–200)
HDL: 46.3 mg/dL (ref >39.00)
LDL Cholesterol: 78 mg/dL (ref 0–99)
NonHDL: 91.36
Total CHOL/HDL Ratio: 3
Triglycerides: 68 mg/dL (ref 0.0–149.0)
VLDL: 13.6 mg/dL (ref 0.0–40.0)

## 2020-05-23 LAB — HEMOGLOBIN A1C: Hgb A1c MFr Bld: 6 % (ref 4.6–6.5)

## 2020-05-23 NOTE — Progress Notes (Signed)
Subjective:    Patient ID: Stacy Pham, female    DOB: 23-Nov-1930, 86 y.o.   MRN: [id number]  HPI Chief Complaint  Patient presents with  . Follow-up    DM   This is an 86 year old female who presents today for follow-up of chronic medical conditions.  She is transported by her granddaughter who is not in exam room with her.  She reports that she has been doing pretty well.  I have not seen her in over 8 months.  She  has a past medical history of (HFpEF) heart failure with preserved ejection fraction ([place]), Arthritis, Back pain, Bronchitis, Cataract, Diabetes mellitus without complication ([place]), Dialysis patient ([place]), Dysrhythmia, ESRD (end stage renal disease) ([place]), Gout, History of methicillin resistant staphylococcus aureus (MRSA), Hypertension, and PAF (paroxysmal atrial fibrillation) ([place]).  Diabetes mellitus type 2-she has been off of medications for a little while with good control.  She periodically checks her blood sugar and is never above 150.  Peripheral vascular disease with left great toe amputation-she has healed well from her surgery and is using a walker.  She denies any recent falls.  End-stage renal disease on dialysis-she continues to go to dialysis on Tuesday, Thursday and Saturday.  Lower abdominal pain-a couple of weeks ago she reports that she had some swelling in her low abdomen with some pain and symptoms of burning with urination although she does not urinate.  She reports that this has resolved.  She has 2-3 normal bowel movements a day, she denies mucus or blood.   Review of Systems Denies headache, visual change, chest pain, shortness of breath, leg swelling    Objective:   Physical Exam Vitals reviewed.  Constitutional:      General: She is not in acute distress.    Appearance: Normal appearance. She is normal weight. She is not ill-appearing, toxic-appearing or diaphoretic.  HENT:     Head: Normocephalic and atraumatic.  Eyes:      Conjunctiva/sclera: Conjunctivae normal.  Cardiovascular:     Rate and Rhythm: Normal rate and regular rhythm.     Heart sounds: Normal heart sounds.  Pulmonary:     Effort: Pulmonary effort is normal.     Breath sounds: Normal breath sounds.  Abdominal:     General: Abdomen is flat. There is no distension.     Palpations: Abdomen is soft. There is no mass.     Tenderness: There is no abdominal tenderness. There is no guarding or rebound.     Hernia: No hernia is present.  Musculoskeletal:     Right lower leg: No edema.     Left lower leg: No edema.  Skin:    General: Skin is warm and dry.  Neurological:     Mental Status: She is alert and oriented to person, place, and time.  Psychiatric:        Mood and Affect: Mood normal.        Behavior: Behavior normal.        Thought Content: Thought content normal.        Judgment: Judgment normal.       BP (!) 138/58   Pulse 66   Temp 97.6 F (36.4 C) (Temporal)   Ht 5\' 3"  (1.6 m)   Wt 157 lb (71.2 kg)   SpO2 97%   BMI 27.81 kg/m  Wt Readings from Last 3 Encounters:  05/23/20 157 lb (71.2 kg)  03/12/20 150 lb (68 kg)  03/10/20 158 lb 9.6 oz (71.9  kg)   Diabetic Foot Exam - Simple   Simple Foot Form Diabetic Foot exam was performed with the following findings: Yes 05/23/2020  9:00 AM  Visual Inspection See comments: Yes Sensation Testing Pulse Check See comments: Yes Comments Left great toe surgically absent.  Well-healed scar. Some decreased sensation right foot, fourth fifth toe, left foot, fourth toe, bilateral heels begin (callus present).  Feet slightly cool to touch but normal color.  Right great toenail absent.  DP/PT pulses bilaterally very faint.         Assessment & Plan:  1. Type 2 diabetes mellitus with chronic kidney disease on chronic dialysis, without long-term current use of insulin (HCC) - Hemoglobin A1c - Lipid Panel  2. Lower abdominal pain -Per patient, this has resolved, discussed  importance of keeping her bowel movements regular, adequate fluid, I have asked her to please follow-up if swelling/pain recur  3. ESRD (end stage renal disease) ([place]) -Continue current dialysis schedule per nephrology  4. PVD (peripheral vascular disease) ([place]) -Continue vascular and podiatry follow-up  This visit occurred during the SARS-CoV-2 public health emergency.  Safety protocols were in place, including screening questions prior to the visit, additional usage of staff PPE, and extensive cleaning of exam room while observing appropriate contact time as indicated for disinfecting solutions.      Clarene Reamer, FNP-BC  [facility], [facility]  05/23/2020 5:29 PM

## 2020-05-23 NOTE — Patient Instructions (Signed)
Good to see you today  Please follow up in 6 months

## 2020-05-24 DIAGNOSIS — D631 Anemia in chronic kidney disease: Secondary | ICD-10-CM | POA: Diagnosis not present

## 2020-05-24 DIAGNOSIS — N2581 Secondary hyperparathyroidism of renal origin: Secondary | ICD-10-CM | POA: Diagnosis not present

## 2020-05-24 DIAGNOSIS — D509 Iron deficiency anemia, unspecified: Secondary | ICD-10-CM | POA: Diagnosis not present

## 2020-05-24 DIAGNOSIS — N186 End stage renal disease: Secondary | ICD-10-CM | POA: Diagnosis not present

## 2020-05-24 DIAGNOSIS — Z992 Dependence on renal dialysis: Secondary | ICD-10-CM | POA: Diagnosis not present

## 2020-05-24 DIAGNOSIS — T82898A Other specified complication of vascular prosthetic devices, implants and grafts, initial encounter: Secondary | ICD-10-CM | POA: Diagnosis not present

## 2020-05-27 DIAGNOSIS — D631 Anemia in chronic kidney disease: Secondary | ICD-10-CM | POA: Diagnosis not present

## 2020-05-27 DIAGNOSIS — Z992 Dependence on renal dialysis: Secondary | ICD-10-CM | POA: Diagnosis not present

## 2020-05-27 DIAGNOSIS — T82898A Other specified complication of vascular prosthetic devices, implants and grafts, initial encounter: Secondary | ICD-10-CM | POA: Diagnosis not present

## 2020-05-27 DIAGNOSIS — D509 Iron deficiency anemia, unspecified: Secondary | ICD-10-CM | POA: Diagnosis not present

## 2020-05-27 DIAGNOSIS — N186 End stage renal disease: Secondary | ICD-10-CM | POA: Diagnosis not present

## 2020-05-27 DIAGNOSIS — N2581 Secondary hyperparathyroidism of renal origin: Secondary | ICD-10-CM | POA: Diagnosis not present

## 2020-05-29 DIAGNOSIS — N186 End stage renal disease: Secondary | ICD-10-CM | POA: Diagnosis not present

## 2020-05-29 DIAGNOSIS — N2581 Secondary hyperparathyroidism of renal origin: Secondary | ICD-10-CM | POA: Diagnosis not present

## 2020-05-29 DIAGNOSIS — D509 Iron deficiency anemia, unspecified: Secondary | ICD-10-CM | POA: Diagnosis not present

## 2020-05-29 DIAGNOSIS — D631 Anemia in chronic kidney disease: Secondary | ICD-10-CM | POA: Diagnosis not present

## 2020-05-29 DIAGNOSIS — Z992 Dependence on renal dialysis: Secondary | ICD-10-CM | POA: Diagnosis not present

## 2020-05-29 DIAGNOSIS — T82898A Other specified complication of vascular prosthetic devices, implants and grafts, initial encounter: Secondary | ICD-10-CM | POA: Diagnosis not present

## 2020-05-31 DIAGNOSIS — N2581 Secondary hyperparathyroidism of renal origin: Secondary | ICD-10-CM | POA: Diagnosis not present

## 2020-05-31 DIAGNOSIS — T82898A Other specified complication of vascular prosthetic devices, implants and grafts, initial encounter: Secondary | ICD-10-CM | POA: Diagnosis not present

## 2020-05-31 DIAGNOSIS — N186 End stage renal disease: Secondary | ICD-10-CM | POA: Diagnosis not present

## 2020-05-31 DIAGNOSIS — D631 Anemia in chronic kidney disease: Secondary | ICD-10-CM | POA: Diagnosis not present

## 2020-05-31 DIAGNOSIS — D509 Iron deficiency anemia, unspecified: Secondary | ICD-10-CM | POA: Diagnosis not present

## 2020-05-31 DIAGNOSIS — Z992 Dependence on renal dialysis: Secondary | ICD-10-CM | POA: Diagnosis not present

## 2020-06-03 DIAGNOSIS — Z992 Dependence on renal dialysis: Secondary | ICD-10-CM | POA: Diagnosis not present

## 2020-06-03 DIAGNOSIS — T82898A Other specified complication of vascular prosthetic devices, implants and grafts, initial encounter: Secondary | ICD-10-CM | POA: Diagnosis not present

## 2020-06-03 DIAGNOSIS — D631 Anemia in chronic kidney disease: Secondary | ICD-10-CM | POA: Diagnosis not present

## 2020-06-03 DIAGNOSIS — N2581 Secondary hyperparathyroidism of renal origin: Secondary | ICD-10-CM | POA: Diagnosis not present

## 2020-06-03 DIAGNOSIS — D509 Iron deficiency anemia, unspecified: Secondary | ICD-10-CM | POA: Diagnosis not present

## 2020-06-03 DIAGNOSIS — N186 End stage renal disease: Secondary | ICD-10-CM | POA: Diagnosis not present

## 2020-06-07 DIAGNOSIS — D631 Anemia in chronic kidney disease: Secondary | ICD-10-CM | POA: Diagnosis not present

## 2020-06-07 DIAGNOSIS — N2581 Secondary hyperparathyroidism of renal origin: Secondary | ICD-10-CM | POA: Diagnosis not present

## 2020-06-07 DIAGNOSIS — T82898A Other specified complication of vascular prosthetic devices, implants and grafts, initial encounter: Secondary | ICD-10-CM | POA: Diagnosis not present

## 2020-06-07 DIAGNOSIS — D509 Iron deficiency anemia, unspecified: Secondary | ICD-10-CM | POA: Diagnosis not present

## 2020-06-07 DIAGNOSIS — N186 End stage renal disease: Secondary | ICD-10-CM | POA: Diagnosis not present

## 2020-06-07 DIAGNOSIS — Z992 Dependence on renal dialysis: Secondary | ICD-10-CM | POA: Diagnosis not present

## 2020-06-10 DIAGNOSIS — Z992 Dependence on renal dialysis: Secondary | ICD-10-CM | POA: Diagnosis not present

## 2020-06-10 DIAGNOSIS — D509 Iron deficiency anemia, unspecified: Secondary | ICD-10-CM | POA: Diagnosis not present

## 2020-06-10 DIAGNOSIS — T82898A Other specified complication of vascular prosthetic devices, implants and grafts, initial encounter: Secondary | ICD-10-CM | POA: Diagnosis not present

## 2020-06-10 DIAGNOSIS — N186 End stage renal disease: Secondary | ICD-10-CM | POA: Diagnosis not present

## 2020-06-10 DIAGNOSIS — D631 Anemia in chronic kidney disease: Secondary | ICD-10-CM | POA: Diagnosis not present

## 2020-06-10 DIAGNOSIS — N2581 Secondary hyperparathyroidism of renal origin: Secondary | ICD-10-CM | POA: Diagnosis not present

## 2020-06-12 DIAGNOSIS — D631 Anemia in chronic kidney disease: Secondary | ICD-10-CM | POA: Diagnosis not present

## 2020-06-12 DIAGNOSIS — N2581 Secondary hyperparathyroidism of renal origin: Secondary | ICD-10-CM | POA: Diagnosis not present

## 2020-06-12 DIAGNOSIS — N186 End stage renal disease: Secondary | ICD-10-CM | POA: Diagnosis not present

## 2020-06-12 DIAGNOSIS — D509 Iron deficiency anemia, unspecified: Secondary | ICD-10-CM | POA: Diagnosis not present

## 2020-06-12 DIAGNOSIS — Z992 Dependence on renal dialysis: Secondary | ICD-10-CM | POA: Diagnosis not present

## 2020-06-16 ENCOUNTER — Other Ambulatory Visit: Payer: Self-pay | Admitting: Family Medicine

## 2020-06-16 NOTE — Telephone Encounter (Signed)
Pharmacy requests refill on: Metoprolol Tartrate 25 mg    LAST REFILL: 03/25/2020 (Q-90, R-0) LAST OV: 05/23/2020 NEXT OV: Not Scheduled  PHARMACY: [facility] [place], [place]

## 2020-06-17 DIAGNOSIS — D631 Anemia in chronic kidney disease: Secondary | ICD-10-CM | POA: Diagnosis not present

## 2020-06-17 DIAGNOSIS — Z992 Dependence on renal dialysis: Secondary | ICD-10-CM | POA: Diagnosis not present

## 2020-06-17 DIAGNOSIS — N186 End stage renal disease: Secondary | ICD-10-CM | POA: Diagnosis not present

## 2020-06-17 DIAGNOSIS — D509 Iron deficiency anemia, unspecified: Secondary | ICD-10-CM | POA: Diagnosis not present

## 2020-06-17 DIAGNOSIS — N2581 Secondary hyperparathyroidism of renal origin: Secondary | ICD-10-CM | POA: Diagnosis not present

## 2020-06-19 ENCOUNTER — Other Ambulatory Visit (INDEPENDENT_AMBULATORY_CARE_PROVIDER_SITE_OTHER): Payer: Self-pay | Admitting: Nurse Practitioner

## 2020-06-19 ENCOUNTER — Telehealth (INDEPENDENT_AMBULATORY_CARE_PROVIDER_SITE_OTHER): Payer: Self-pay | Admitting: Nurse Practitioner

## 2020-06-19 DIAGNOSIS — I7025 Atherosclerosis of native arteries of other extremities with ulceration: Secondary | ICD-10-CM

## 2020-06-19 DIAGNOSIS — N186 End stage renal disease: Secondary | ICD-10-CM | POA: Diagnosis not present

## 2020-06-19 DIAGNOSIS — D631 Anemia in chronic kidney disease: Secondary | ICD-10-CM | POA: Diagnosis not present

## 2020-06-19 DIAGNOSIS — D509 Iron deficiency anemia, unspecified: Secondary | ICD-10-CM | POA: Diagnosis not present

## 2020-06-19 DIAGNOSIS — N2581 Secondary hyperparathyroidism of renal origin: Secondary | ICD-10-CM | POA: Diagnosis not present

## 2020-06-19 DIAGNOSIS — Z992 Dependence on renal dialysis: Secondary | ICD-10-CM | POA: Diagnosis not present

## 2020-06-19 MED ORDER — HYDROCODONE-ACETAMINOPHEN 5-325 MG PO TABS: 1.0000 | ORAL_TABLET | Freq: Four times a day (QID) | ORAL | 0 refills | Status: DC | PRN

## 2020-06-20 NOTE — Telephone Encounter (Signed)
Called & scheduled patient 

## 2020-06-20 NOTE — Telephone Encounter (Signed)
Please schedule per the Np.

## 2020-06-21 DIAGNOSIS — N186 End stage renal disease: Secondary | ICD-10-CM | POA: Diagnosis not present

## 2020-06-21 DIAGNOSIS — D509 Iron deficiency anemia, unspecified: Secondary | ICD-10-CM | POA: Diagnosis not present

## 2020-06-21 DIAGNOSIS — N2581 Secondary hyperparathyroidism of renal origin: Secondary | ICD-10-CM | POA: Diagnosis not present

## 2020-06-21 DIAGNOSIS — Z992 Dependence on renal dialysis: Secondary | ICD-10-CM | POA: Diagnosis not present

## 2020-06-21 DIAGNOSIS — D631 Anemia in chronic kidney disease: Secondary | ICD-10-CM | POA: Diagnosis not present

## 2020-06-23 ENCOUNTER — Encounter (INDEPENDENT_AMBULATORY_CARE_PROVIDER_SITE_OTHER): Payer: Self-pay

## 2020-06-23 ENCOUNTER — Encounter: Payer: Self-pay | Admitting: Family Medicine

## 2020-06-23 ENCOUNTER — Encounter (INDEPENDENT_AMBULATORY_CARE_PROVIDER_SITE_OTHER): Payer: Self-pay | Admitting: Vascular Surgery

## 2020-06-23 ENCOUNTER — Telehealth (INDEPENDENT_AMBULATORY_CARE_PROVIDER_SITE_OTHER): Payer: Self-pay

## 2020-06-23 ENCOUNTER — Ambulatory Visit (INDEPENDENT_AMBULATORY_CARE_PROVIDER_SITE_OTHER): Payer: Medicare Other | Admitting: Vascular Surgery

## 2020-06-23 ENCOUNTER — Other Ambulatory Visit: Payer: Self-pay

## 2020-06-23 VITALS — BP 200/68 | HR 72 | Resp 16 | Wt 158.8 lb

## 2020-06-23 DIAGNOSIS — T829XXS Unspecified complication of cardiac and vascular prosthetic device, implant and graft, sequela: Secondary | ICD-10-CM

## 2020-06-23 DIAGNOSIS — I7025 Atherosclerosis of native arteries of other extremities with ulceration: Secondary | ICD-10-CM | POA: Diagnosis not present

## 2020-06-23 DIAGNOSIS — N186 End stage renal disease: Secondary | ICD-10-CM

## 2020-06-23 DIAGNOSIS — E1159 Type 2 diabetes mellitus with other circulatory complications: Secondary | ICD-10-CM

## 2020-06-23 DIAGNOSIS — I10 Essential (primary) hypertension: Secondary | ICD-10-CM

## 2020-06-23 MED ORDER — METOPROLOL TARTRATE 25 MG PO TABS
12.5000 mg | ORAL_TABLET | Freq: Two times a day (BID) | ORAL | 1 refills | Status: AC
Start: 2020-06-23 — End: ?

## 2020-06-23 MED ORDER — HYDROCODONE-ACETAMINOPHEN 5-325 MG PO TABS
1.0000 | ORAL_TABLET | Freq: Four times a day (QID) | ORAL | 0 refills | Status: DC | PRN
Start: 2020-06-23 — End: 2020-08-04

## 2020-06-23 NOTE — Telephone Encounter (Signed)
I spoke with the patient's daughter regarding her right leg angio procedure with Dr. Delana Meyer. Per her daughter it was okay to schedule the procedure on 07/01/20 with a 6:45 am arrival time to the [facility]. Covid testing on 06/27/20 between 8-1 pm at the [facility]. Pre-procedure instructions will be mailed to the home. I attempted to contact the daughter to discuss the instructions and a message was left for a return call.

## 2020-06-23 NOTE — Progress Notes (Signed)
MRN : [id number]  Stacy Pham is a 86 y.o. ([birth year]) female who presents with chief complaint of  Chief Complaint  Patient presents with  . Follow-up    Add on rx refill  .  History of Present Illness:   The patient returns to the office for followup of her atherosclerosis of the lower extremities. There has been a significant deterioration in the lower extremity symptoms.  The patient notes interval shortening of their claudication distance and development of significant right leg rest pain symptoms.   She feels the ulcer between the 4th and 5th right toes is worse and more painful.  There have been no significant changes to the patient's overall health care.  The patient denies amaurosis fugax or recent TIA symptoms. There are no recent neurological changes noted. The patient denies history of DVT, PE or superficial thrombophlebitis. The patient denies recent episodes of angina or shortness of breath.    Current Meds  Medication Sig  . acetaminophen (TYLENOL) 650 MG CR tablet Take 650 mg every 8 (eight) hours as needed by mouth for pain.  [name] allopurinol (ZYLOPRIM) 100 MG tablet TAKE 1 TABLET BY MOUTH EVERYDAY AT BEDTIME  . amiodarone (PACERONE) 200 MG tablet Take 1 tablet (200 mg total) by mouth every morning. Take 1 tablet (200 mg) by mouth once daily (Patient taking differently: Take 200 mg by mouth every morning.)  . amLODipine (NORVASC) 5 MG tablet TAKE 1 TABLET BY MOUTH EVERY DAY  . atorvastatin (LIPITOR) 20 MG tablet TAKE 1 TABLET BY MOUTH EVERY DAY IN THE EVENING  . blood glucose meter kit and supplies KIT Please dispense either One Touch or Bayer Contour She must have one of these machines since she is on peritoneal dialysis. Use up to four times daily as directed. (FOR ICD-9 250.00, 250.01).  [name] clopidogrel (PLAVIX) 75 MG tablet TAKE 1 TABLET BY MOUTH EVERY DAY  . colchicine 0.6 MG tablet Take 0.6 mg by mouth daily as needed (gout).  [name] ELIQUIS 2.5 MG TABS tablet TAKE  1 TABLET BY MOUTH TWICE A DAY  . Ferric Citrate (AURYXIA PO) Take 220 mg by mouth 3 (three) times daily.  [name] HYDROcodone-acetaminophen (NORCO) 5-325 MG tablet Take 1 tablet by mouth every 6 (six) hours as needed for moderate pain or severe pain.  . metoprolol tartrate (LOPRESSOR) 25 MG tablet TAKE 0.5 TABLETS (12.5 MG TOTAL) BY MOUTH 2 (TWO) TIMES DAILY.  . mupirocin ointment (BACTROBAN) 2 % Apply 1 application topically daily.    Past Medical History:  Diagnosis Date  . (HFpEF) heart failure with preserved ejection fraction ([place])    a. 10/2017 Echo: EF 60-65%, Gr1 DD, mild MR, mildly dil LA/RA. PASP 21mHg.  . Arthritis   . Back pain   . Bronchitis   . Cataract   . Diabetes mellitus without complication (HCC)    no meds currently  . [place] ([place]   . Dysrhythmia   . ESRD (end stage renal disease) ([facility]    a. 2018 - initially on HD but then transitioned to nightly PD in 08/2017.  .Marland KitchenGout   . History of methicillin resistant staphylococcus aureus (MRSA)   . Hypertension   . PAF (paroxysmal atrial fibrillation) (HCC)    a. CHA2DS2VASc = 5-->eliquis 2.5 BID.    Past Surgical History:  Procedure Laterality Date  . A/V FISTULAGRAM Right 11/13/2019   Procedure: A/V FISTULAGRAM;  Surgeon: SKatha Cabal MD;  [facility];  Service: Cardiovascular;  Laterality: Right;  . AMPUTATION Left 01/09/2020   Procedure: AMPUTATION RAY ( GREAT TOE );  Surgeon: Katha Cabal, MD;  Location: [facility];  Service: Vascular;  Laterality: Left;  . AV FISTULA PLACEMENT Right 09/13/2018   Procedure: INSERTION OF ARTERIOVENOUS (AV) GORE-TEX GRAFT ARM;  Surgeon: Katha Cabal, MD;  Location: [facility];  Service: Vascular;  Laterality: Right;  . CAPD INSERTION N/A 06/08/2017   Procedure: LAPAROSCOPIC INSERTION CONTINUOUS AMBULATORY PERITONEAL DIALYSIS  (CAPD) CATHETER;  Surgeon: Katha Cabal, MD;  Location: [facility];  Service: Vascular;  Laterality: N/A;  . CATARACT  EXTRACTION, BILATERAL Bilateral   . DIALYSIS/PERMA CATHETER INSERTION N/A 03/18/2017   Procedure: DIALYSIS/PERMA CATHETER INSERTION;  Surgeon: Katha Cabal, MD;  Location: [place] [facility];  Service: Cardiovascular;  Laterality: N/A;  . DIALYSIS/PERMA CATHETER REMOVAL N/A 10/20/2017   Procedure: DIALYSIS/PERMA CATHETER REMOVAL;  Surgeon: Algernon Huxley, MD;  Location: [place] [facility];  Service: Cardiovascular;  Laterality: N/A;  . EYE SURGERY    . LOWER EXTREMITY ANGIOGRAPHY Left 09/11/2019   Procedure: LOWER EXTREMITY ANGIOGRAPHY;  Surgeon: Katha Cabal, MD;  Location: [place] [facility];  Service: Cardiovascular;  Laterality: Left;  . LOWER EXTREMITY ANGIOGRAPHY Right 10/17/2019   Procedure: LOWER EXTREMITY ANGIOGRAPHY;  Surgeon: Katha Cabal, MD;  Location: [place] [facility];  Service: Cardiovascular;  Laterality: Right;  . LOWER EXTREMITY ANGIOGRAPHY Left 11/20/2019   Procedure: LOWER EXTREMITY ANGIOGRAPHY;  Surgeon: Katha Cabal, MD;  Location: [place] [facility];  Service: Cardiovascular;  Laterality: Left;  . PERIPHERAL VASCULAR THROMBECTOMY Right 03/12/2020   Procedure: PERIPHERAL VASCULAR THROMBECTOMY;  Surgeon: Katha Cabal, MD;  Location: [place] [facility];  Service: Cardiovascular;  Laterality: Right;  . REMOVAL OF A DIALYSIS CATHETER N/A 02/23/2019   Procedure: REMOVAL OF A DIALYSIS CATHETER ( PD CATH);  Surgeon: Katha Cabal, MD;  Location: [facility];  Service: Vascular;  Laterality: N/A;  . TEMPORARY DIALYSIS CATHETER  03/11/2020   Procedure: TEMPORARY DIALYSIS CATHETER;  Surgeon: Katha Cabal, MD;  Location: [place] [facility];  Service: Cardiovascular;;    Social History Social History   Tobacco Use  . Smoking status: Never Smoker  . Smokeless tobacco: Never Used  Vaping Use  . Vaping Use: Never used  Substance Use Topics  . Alcohol use: No  . Drug use: No    Family History Family History  Problem Relation Age of  Onset  . Hypertension Father     No Known Allergies   REVIEW OF SYSTEMS (Negative unless checked)  Constitutional: _0 Weight loss  _1 Fever  _2 Chills Cardiac: _3 Chest pain   _4 Chest pressure   _5 Palpitations   _6 Shortness of breath when laying flat   _7 Shortness of breath with exertion. Vascular:  _8 Pain in legs with walking   _9 Pain in legs at rest  _10 History of DVT   _11 Phlebitis   _12 Swelling in legs   _13 Varicose veins   _14 Non-healing ulcers Pulmonary:   _15 Uses home oxygen   _16 Productive cough   _17 Hemoptysis   _18 Wheeze  _19 COPD   _20 Asthma Neurologic:  _21 Dizziness   _22 Seizures   _23 History of stroke   _24 History of TIA  _25 Aphasia   _26 Vissual changes   _27 Weakness or numbness in arm   _28 Weakness or numbness in leg Musculoskeletal:   _29 Joint swelling   _30 Joint pain   _31 Low back pain Hematologic:  _32 Easy bruising  _33 Easy bleeding   _34 Hypercoagulable state   _35 Anemic Gastrointestinal:  _36 Diarrhea   _37 Vomiting  _38   Gastroesophageal reflux/heartburn   _0 Difficulty swallowing. Genitourinary:  _1 Chronic kidney disease   _2 Difficult urination  _3 Frequent urination   _4 Blood in urine Skin:  _5 Rashes   _6 Ulcers  Psychological:  _7 History of anxiety   _8  History of major depression.  Physical Examination  Vitals:   06/23/20 0948  BP: (!) 200/68  Pulse: 72  Resp: 16  Weight: 158 lb 12.8 oz (72 kg)   Body mass index is 28.13 kg/m. Gen: WD/WN, NAD Head: [place]/AT, No temporalis wasting.  Ear/Nose/Throat: Hearing grossly intact, nares w/o erythema or drainage Eyes: PER, EOMI, sclera nonicteric.  Neck: Supple, no large masses.   Pulmonary:  Good air movement, no audible wheezing bilaterally, no use of accessory muscles.  Cardiac: RRR, no JVD Vascular: Ulcer of the web space 4th and 5th toes, dry gangrene of the tip of the left second toe noninfected; right brachial axillary graft good thrill and good bruit Vessel Right Left  Radial Palpable Palpable  PT Not Palpable Not Palpable  DP Not Palpable  Not Palpable  Gastrointestinal: Non-distended. No guarding/no peritoneal signs.  Musculoskeletal: M/S 5/5 throughout.  No deformity or atrophy.  Neurologic: CN 2-12 intact. Symmetrical.  Speech is fluent. Motor exam as listed above. Psychiatric: Judgment intact, Mood & affect appropriate for pt's clinical situation. Dermatologic: No rashes or ulcers noted.  No changes consistent with cellulitis.  CBC Lab Results  Component Value Date   WBC 6.8 03/12/2020   HGB 8.6 (L) 03/12/2020   HCT 26.9 (L) 03/12/2020   MCV 99.6 03/12/2020   PLT 283 03/12/2020    BMET    Component Value Date/Time   NA 138 03/12/2020 0601   K 4.1 03/12/2020 0601   CL 99 03/12/2020 0601   CO2 27 03/12/2020 0601   GLUCOSE 84 03/12/2020 0601   BUN 36 (H) 03/12/2020 0601   CREATININE 5.59 (H) 03/12/2020 0601   CALCIUM 8.9 03/12/2020 0601   GFRNONAA 6 (L) 03/12/2020 0601   GFRAA 7 (L) 03/12/2020 0601   CrCl cannot be calculated (Patient's most recent lab result is older than the maximum 21 days allowed.).  COAG Lab Results  Component Value Date   INR 1.3 (H) 01/02/2020   INR 1.5 (H) 02/16/2019   INR 1.45 08/21/2018    Radiology No results found.   Assessment/Plan 1. Atherosclerosis of native arteries of the extremities with ulceration ([place]) Recommend:  The patient has evidence of severe atherosclerotic changes of both lower extremities with rest pain that is associated with preulcerative changes and impending tissue loss of the right foot.  This represents a limb threatening ischemia and places the patient at the risk for right limb loss.  Patient should undergo angiography of the lower extremities with the hope for intervention for limb salvage.  The risks and benefits as well as the alternative therapies was discussed in detail with the patient.  All questions were answered.  Patient agrees to proceed with right leg angiography.  The patient will follow up with me in the office after the procedure.    - HYDROcodone-acetaminophen (NORCO) 5-325 MG tablet; Take 1 tablet by mouth every 6 (six) hours as needed for moderate pain or severe pain.  Dispense: 36 tablet; Refill: 0  2. Primary hypertension Continue antihypertensive medications as already ordered, these medications have been reviewed and there are no changes at this time.   3. Type 2 diabetes mellitus with vascular disease ([place]) Continue hypoglycemic medications as already ordered, these medications have been reviewed and there are no changes  at this time.  Hgb A1C to be monitored as already arranged by primary service   4. Complication from renal dialysis device, sequela Recommend:  The patient is doing well and currently has adequate dialysis access. The patient's dialysis center is not reporting any access issues. Flow pattern is stable when compared to the prior ultrasound.  The patient should have a duplex ultrasound of the dialysis access in 6 months. The patient will follow-up with me in the office after each ultrasound     5. ESRD (end stage renal disease) ([place]) At the present time the patient has adequate dialysis access.  Continue hemodialysis as ordered without interruption.  Avoid nephrotoxic medications and dehydration.  Further plans per nephrology    Hortencia Pilar, MD  06/23/2020 4:21 PM

## 2020-06-24 DIAGNOSIS — N2581 Secondary hyperparathyroidism of renal origin: Secondary | ICD-10-CM | POA: Diagnosis not present

## 2020-06-24 DIAGNOSIS — Z992 Dependence on renal dialysis: Secondary | ICD-10-CM | POA: Diagnosis not present

## 2020-06-24 DIAGNOSIS — D631 Anemia in chronic kidney disease: Secondary | ICD-10-CM | POA: Diagnosis not present

## 2020-06-24 DIAGNOSIS — D509 Iron deficiency anemia, unspecified: Secondary | ICD-10-CM | POA: Diagnosis not present

## 2020-06-24 DIAGNOSIS — N186 End stage renal disease: Secondary | ICD-10-CM | POA: Diagnosis not present

## 2020-06-25 ENCOUNTER — Other Ambulatory Visit: Payer: Self-pay | Admitting: Family Medicine

## 2020-06-25 DIAGNOSIS — I480 Paroxysmal atrial fibrillation: Secondary | ICD-10-CM

## 2020-06-26 ENCOUNTER — Other Ambulatory Visit: Payer: Medicare Other

## 2020-06-26 DIAGNOSIS — N2581 Secondary hyperparathyroidism of renal origin: Secondary | ICD-10-CM | POA: Diagnosis not present

## 2020-06-26 DIAGNOSIS — D631 Anemia in chronic kidney disease: Secondary | ICD-10-CM | POA: Diagnosis not present

## 2020-06-26 DIAGNOSIS — D509 Iron deficiency anemia, unspecified: Secondary | ICD-10-CM | POA: Diagnosis not present

## 2020-06-26 DIAGNOSIS — N186 End stage renal disease: Secondary | ICD-10-CM | POA: Diagnosis not present

## 2020-06-26 DIAGNOSIS — Z992 Dependence on renal dialysis: Secondary | ICD-10-CM | POA: Diagnosis not present

## 2020-06-27 ENCOUNTER — Other Ambulatory Visit (INDEPENDENT_AMBULATORY_CARE_PROVIDER_SITE_OTHER): Payer: Self-pay | Admitting: Vascular Surgery

## 2020-06-27 ENCOUNTER — Other Ambulatory Visit: Payer: Medicare Other | Attending: Vascular Surgery

## 2020-06-28 DIAGNOSIS — N186 End stage renal disease: Secondary | ICD-10-CM | POA: Diagnosis not present

## 2020-06-28 DIAGNOSIS — D509 Iron deficiency anemia, unspecified: Secondary | ICD-10-CM | POA: Diagnosis not present

## 2020-06-28 DIAGNOSIS — D631 Anemia in chronic kidney disease: Secondary | ICD-10-CM | POA: Diagnosis not present

## 2020-06-28 DIAGNOSIS — N2581 Secondary hyperparathyroidism of renal origin: Secondary | ICD-10-CM | POA: Diagnosis not present

## 2020-06-28 DIAGNOSIS — Z992 Dependence on renal dialysis: Secondary | ICD-10-CM | POA: Diagnosis not present

## 2020-07-01 ENCOUNTER — Encounter: Admission: RE | Payer: Self-pay | Source: Home / Self Care

## 2020-07-01 ENCOUNTER — Ambulatory Visit: Admission: RE | Admit: 2020-07-01 | Payer: Medicare Other | Source: Home / Self Care | Admitting: Vascular Surgery

## 2020-07-01 ENCOUNTER — Other Ambulatory Visit (INDEPENDENT_AMBULATORY_CARE_PROVIDER_SITE_OTHER): Payer: Self-pay | Admitting: Nurse Practitioner

## 2020-07-01 DIAGNOSIS — N186 End stage renal disease: Secondary | ICD-10-CM | POA: Diagnosis not present

## 2020-07-01 DIAGNOSIS — D509 Iron deficiency anemia, unspecified: Secondary | ICD-10-CM | POA: Diagnosis not present

## 2020-07-01 DIAGNOSIS — D631 Anemia in chronic kidney disease: Secondary | ICD-10-CM | POA: Diagnosis not present

## 2020-07-01 DIAGNOSIS — Z992 Dependence on renal dialysis: Secondary | ICD-10-CM | POA: Diagnosis not present

## 2020-07-01 DIAGNOSIS — N2581 Secondary hyperparathyroidism of renal origin: Secondary | ICD-10-CM | POA: Diagnosis not present

## 2020-07-01 SURGERY — LOWER EXTREMITY ANGIOGRAPHY
Anesthesia: Moderate Sedation | Laterality: Right

## 2020-07-02 ENCOUNTER — Other Ambulatory Visit: Payer: Self-pay | Admitting: Family Medicine

## 2020-07-02 DIAGNOSIS — E1122 Type 2 diabetes mellitus with diabetic chronic kidney disease: Secondary | ICD-10-CM

## 2020-07-02 NOTE — Telephone Encounter (Signed)
Patient had to be rescheduled per the patient and has been rescheduled to 07/22/20 with a 6:45 am arrival time to the [facility]. Covid testing on 07/18/20 between 8-1 pm at the [facility]. Pre-procedure instructions were discussed with the patient's daughter and will be mailed.

## 2020-07-03 DIAGNOSIS — N2581 Secondary hyperparathyroidism of renal origin: Secondary | ICD-10-CM | POA: Diagnosis not present

## 2020-07-03 DIAGNOSIS — N186 End stage renal disease: Secondary | ICD-10-CM | POA: Diagnosis not present

## 2020-07-03 DIAGNOSIS — D631 Anemia in chronic kidney disease: Secondary | ICD-10-CM | POA: Diagnosis not present

## 2020-07-03 DIAGNOSIS — Z992 Dependence on renal dialysis: Secondary | ICD-10-CM | POA: Diagnosis not present

## 2020-07-03 DIAGNOSIS — D509 Iron deficiency anemia, unspecified: Secondary | ICD-10-CM | POA: Diagnosis not present

## 2020-07-08 DIAGNOSIS — Z992 Dependence on renal dialysis: Secondary | ICD-10-CM | POA: Diagnosis not present

## 2020-07-08 DIAGNOSIS — N2581 Secondary hyperparathyroidism of renal origin: Secondary | ICD-10-CM | POA: Diagnosis not present

## 2020-07-08 DIAGNOSIS — D509 Iron deficiency anemia, unspecified: Secondary | ICD-10-CM | POA: Diagnosis not present

## 2020-07-08 DIAGNOSIS — N186 End stage renal disease: Secondary | ICD-10-CM | POA: Diagnosis not present

## 2020-07-08 DIAGNOSIS — D631 Anemia in chronic kidney disease: Secondary | ICD-10-CM | POA: Diagnosis not present

## 2020-07-10 DIAGNOSIS — N186 End stage renal disease: Secondary | ICD-10-CM | POA: Diagnosis not present

## 2020-07-10 DIAGNOSIS — Z992 Dependence on renal dialysis: Secondary | ICD-10-CM | POA: Diagnosis not present

## 2020-07-10 DIAGNOSIS — D631 Anemia in chronic kidney disease: Secondary | ICD-10-CM | POA: Diagnosis not present

## 2020-07-10 DIAGNOSIS — D509 Iron deficiency anemia, unspecified: Secondary | ICD-10-CM | POA: Diagnosis not present

## 2020-07-10 DIAGNOSIS — N2581 Secondary hyperparathyroidism of renal origin: Secondary | ICD-10-CM | POA: Diagnosis not present

## 2020-07-11 DIAGNOSIS — Z992 Dependence on renal dialysis: Secondary | ICD-10-CM | POA: Diagnosis not present

## 2020-07-11 DIAGNOSIS — N186 End stage renal disease: Secondary | ICD-10-CM | POA: Diagnosis not present

## 2020-07-15 DIAGNOSIS — N186 End stage renal disease: Secondary | ICD-10-CM | POA: Diagnosis not present

## 2020-07-15 DIAGNOSIS — N2581 Secondary hyperparathyroidism of renal origin: Secondary | ICD-10-CM | POA: Diagnosis not present

## 2020-07-15 DIAGNOSIS — D631 Anemia in chronic kidney disease: Secondary | ICD-10-CM | POA: Diagnosis not present

## 2020-07-15 DIAGNOSIS — D509 Iron deficiency anemia, unspecified: Secondary | ICD-10-CM | POA: Diagnosis not present

## 2020-07-15 DIAGNOSIS — Z992 Dependence on renal dialysis: Secondary | ICD-10-CM | POA: Diagnosis not present

## 2020-07-17 DIAGNOSIS — N186 End stage renal disease: Secondary | ICD-10-CM | POA: Diagnosis not present

## 2020-07-17 DIAGNOSIS — D509 Iron deficiency anemia, unspecified: Secondary | ICD-10-CM | POA: Diagnosis not present

## 2020-07-17 DIAGNOSIS — Z992 Dependence on renal dialysis: Secondary | ICD-10-CM | POA: Diagnosis not present

## 2020-07-17 DIAGNOSIS — D631 Anemia in chronic kidney disease: Secondary | ICD-10-CM | POA: Diagnosis not present

## 2020-07-17 DIAGNOSIS — N2581 Secondary hyperparathyroidism of renal origin: Secondary | ICD-10-CM | POA: Diagnosis not present

## 2020-07-18 ENCOUNTER — Other Ambulatory Visit
Admission: RE | Admit: 2020-07-18 | Discharge: 2020-07-18 | Disposition: A | Discharge status: Home / Self Care | Payer: Medicare Other | Source: Ambulatory Visit | Attending: Vascular Surgery | Admitting: Vascular Surgery

## 2020-07-18 DIAGNOSIS — Z01812 Encounter for preprocedural laboratory examination: Secondary | ICD-10-CM | POA: Diagnosis not present

## 2020-07-18 DIAGNOSIS — U071 COVID-19: Secondary | ICD-10-CM | POA: Insufficient documentation

## 2020-07-19 DIAGNOSIS — D509 Iron deficiency anemia, unspecified: Secondary | ICD-10-CM | POA: Diagnosis not present

## 2020-07-19 DIAGNOSIS — N2581 Secondary hyperparathyroidism of renal origin: Secondary | ICD-10-CM | POA: Diagnosis not present

## 2020-07-19 DIAGNOSIS — D631 Anemia in chronic kidney disease: Secondary | ICD-10-CM | POA: Diagnosis not present

## 2020-07-19 DIAGNOSIS — Z992 Dependence on renal dialysis: Secondary | ICD-10-CM | POA: Diagnosis not present

## 2020-07-19 DIAGNOSIS — N186 End stage renal disease: Secondary | ICD-10-CM | POA: Diagnosis not present

## 2020-07-21 ENCOUNTER — Other Ambulatory Visit (INDEPENDENT_AMBULATORY_CARE_PROVIDER_SITE_OTHER): Payer: Self-pay | Admitting: Nurse Practitioner

## 2020-07-21 ENCOUNTER — Other Ambulatory Visit: Payer: Self-pay

## 2020-07-21 DIAGNOSIS — D509 Iron deficiency anemia, unspecified: Secondary | ICD-10-CM | POA: Diagnosis not present

## 2020-07-21 DIAGNOSIS — N2581 Secondary hyperparathyroidism of renal origin: Secondary | ICD-10-CM | POA: Diagnosis not present

## 2020-07-21 DIAGNOSIS — N186 End stage renal disease: Secondary | ICD-10-CM | POA: Diagnosis not present

## 2020-07-21 DIAGNOSIS — D631 Anemia in chronic kidney disease: Secondary | ICD-10-CM | POA: Diagnosis not present

## 2020-07-21 DIAGNOSIS — Z992 Dependence on renal dialysis: Secondary | ICD-10-CM | POA: Diagnosis not present

## 2020-07-21 LAB — SARS CORONAVIRUS 2 (TAT 6-24 HRS): SARS Coronavirus 2: POSITIVE — AB

## 2020-07-22 ENCOUNTER — Ambulatory Visit: Admission: RE | Admit: 2020-07-22 | Payer: Medicare Other | Source: Home / Self Care | Admitting: Vascular Surgery

## 2020-07-22 ENCOUNTER — Telehealth: Payer: Self-pay | Admitting: Family

## 2020-07-22 ENCOUNTER — Encounter: Admission: RE | Payer: Self-pay | Source: Home / Self Care

## 2020-07-22 SURGERY — LOWER EXTREMITY ANGIOGRAPHY
Anesthesia: Moderate Sedation | Site: Leg Lower | Laterality: Right

## 2020-07-22 NOTE — Progress Notes (Signed)
Contacted this patient to inform them of the positive covid test result. I informed them that [facility] office will be in touch to reschedule her procedure.

## 2020-07-22 NOTE — Telephone Encounter (Signed)
Called to discuss with patient about COVID-19 symptoms and the use of one of the available treatments for those with mild to moderate Covid symptoms and at a high risk of hospitalization.  Pt appears to qualify for outpatient treatment due to co-morbid conditions and/or a member of an at-risk group in accordance with the FDA Emergency Use Authorization.    Symptom onset: Unknown Vaccinated: No  Booster? No Qualifiers: Type 2 diabetes, hyperlipidemia, ESRD, hypertension, chronic heart failure, age  I attempted to contact Ms. Warshawsky with the primary number leading to her daughter [name]. A generic voicemail was left and Mychart message sent. Will need further information to see if she qualifies for treatment.   Terri Piedra, NP 07/22/2020 12:48 PM

## 2020-07-24 DIAGNOSIS — N186 End stage renal disease: Secondary | ICD-10-CM | POA: Diagnosis not present

## 2020-07-24 DIAGNOSIS — Z992 Dependence on renal dialysis: Secondary | ICD-10-CM | POA: Diagnosis not present

## 2020-07-24 DIAGNOSIS — D509 Iron deficiency anemia, unspecified: Secondary | ICD-10-CM | POA: Diagnosis not present

## 2020-07-24 DIAGNOSIS — D631 Anemia in chronic kidney disease: Secondary | ICD-10-CM | POA: Diagnosis not present

## 2020-07-24 DIAGNOSIS — N2581 Secondary hyperparathyroidism of renal origin: Secondary | ICD-10-CM | POA: Diagnosis not present

## 2020-07-26 DIAGNOSIS — N186 End stage renal disease: Secondary | ICD-10-CM | POA: Diagnosis not present

## 2020-07-26 DIAGNOSIS — D631 Anemia in chronic kidney disease: Secondary | ICD-10-CM | POA: Diagnosis not present

## 2020-07-26 DIAGNOSIS — N2581 Secondary hyperparathyroidism of renal origin: Secondary | ICD-10-CM | POA: Diagnosis not present

## 2020-07-26 DIAGNOSIS — D509 Iron deficiency anemia, unspecified: Secondary | ICD-10-CM | POA: Diagnosis not present

## 2020-07-26 DIAGNOSIS — Z992 Dependence on renal dialysis: Secondary | ICD-10-CM | POA: Diagnosis not present

## 2020-07-31 ENCOUNTER — Emergency Department: Payer: Medicare Other

## 2020-07-31 ENCOUNTER — Encounter: Payer: Self-pay | Admitting: Emergency Medicine

## 2020-07-31 ENCOUNTER — Other Ambulatory Visit: Payer: Self-pay

## 2020-07-31 ENCOUNTER — Observation Stay
Admission: EM | Admit: 2020-07-31 | Discharge: 2020-08-02 | Disposition: A | Discharge status: Home / Self Care | Payer: Medicare Other | Attending: Internal Medicine | Admitting: Internal Medicine

## 2020-07-31 DIAGNOSIS — Z79899 Other long term (current) drug therapy: Secondary | ICD-10-CM | POA: Diagnosis not present

## 2020-07-31 DIAGNOSIS — E872 Acidosis: Secondary | ICD-10-CM | POA: Insufficient documentation

## 2020-07-31 DIAGNOSIS — Z7984 Long term (current) use of oral hypoglycemic drugs: Secondary | ICD-10-CM | POA: Insufficient documentation

## 2020-07-31 DIAGNOSIS — Z992 Dependence on renal dialysis: Secondary | ICD-10-CM | POA: Diagnosis not present

## 2020-07-31 DIAGNOSIS — E1122 Type 2 diabetes mellitus with diabetic chronic kidney disease: Secondary | ICD-10-CM | POA: Diagnosis not present

## 2020-07-31 DIAGNOSIS — U071 COVID-19: Secondary | ICD-10-CM

## 2020-07-31 DIAGNOSIS — I480 Paroxysmal atrial fibrillation: Secondary | ICD-10-CM | POA: Diagnosis present

## 2020-07-31 DIAGNOSIS — I5030 Unspecified diastolic (congestive) heart failure: Secondary | ICD-10-CM | POA: Insufficient documentation

## 2020-07-31 DIAGNOSIS — R0902 Hypoxemia: Secondary | ICD-10-CM | POA: Diagnosis not present

## 2020-07-31 DIAGNOSIS — I120 Hypertensive chronic kidney disease with stage 5 chronic kidney disease or end stage renal disease: Secondary | ICD-10-CM | POA: Diagnosis not present

## 2020-07-31 DIAGNOSIS — I152 Hypertension secondary to endocrine disorders: Secondary | ICD-10-CM | POA: Diagnosis present

## 2020-07-31 DIAGNOSIS — E1169 Type 2 diabetes mellitus with other specified complication: Secondary | ICD-10-CM | POA: Diagnosis present

## 2020-07-31 DIAGNOSIS — I132 Hypertensive heart and chronic kidney disease with heart failure and with stage 5 chronic kidney disease, or end stage renal disease: Secondary | ICD-10-CM | POA: Diagnosis not present

## 2020-07-31 DIAGNOSIS — R6889 Other general symptoms and signs: Secondary | ICD-10-CM | POA: Diagnosis not present

## 2020-07-31 DIAGNOSIS — Z743 Need for continuous supervision: Secondary | ICD-10-CM | POA: Diagnosis not present

## 2020-07-31 DIAGNOSIS — I739 Peripheral vascular disease, unspecified: Secondary | ICD-10-CM | POA: Diagnosis present

## 2020-07-31 DIAGNOSIS — E875 Hyperkalemia: Secondary | ICD-10-CM | POA: Diagnosis not present

## 2020-07-31 DIAGNOSIS — R202 Paresthesia of skin: Secondary | ICD-10-CM | POA: Diagnosis not present

## 2020-07-31 DIAGNOSIS — N186 End stage renal disease: Principal | ICD-10-CM

## 2020-07-31 DIAGNOSIS — E8729 Other acidosis: Secondary | ICD-10-CM

## 2020-07-31 DIAGNOSIS — R7989 Other specified abnormal findings of blood chemistry: Secondary | ICD-10-CM

## 2020-07-31 DIAGNOSIS — Z7901 Long term (current) use of anticoagulants: Secondary | ICD-10-CM | POA: Insufficient documentation

## 2020-07-31 DIAGNOSIS — E785 Hyperlipidemia, unspecified: Secondary | ICD-10-CM | POA: Diagnosis present

## 2020-07-31 DIAGNOSIS — E1159 Type 2 diabetes mellitus with other circulatory complications: Secondary | ICD-10-CM | POA: Diagnosis present

## 2020-07-31 DIAGNOSIS — I10 Essential (primary) hypertension: Secondary | ICD-10-CM | POA: Diagnosis not present

## 2020-07-31 DIAGNOSIS — R799 Abnormal finding of blood chemistry, unspecified: Secondary | ICD-10-CM

## 2020-07-31 LAB — COMPREHENSIVE METABOLIC PANEL
ALT: 178 U/L — ABNORMAL HIGH (ref 0–44)
AST: 266 U/L — ABNORMAL HIGH (ref 15–41)
Albumin: 3.1 g/dL — ABNORMAL LOW (ref 3.5–5.0)
Alkaline Phosphatase: 90 U/L (ref 38–126)
Anion gap: 20 — ABNORMAL HIGH (ref 5–15)
BUN: 61 mg/dL — ABNORMAL HIGH (ref 8–23)
CO2: 22 mmol/L (ref 22–32)
Calcium: 8.6 mg/dL — ABNORMAL LOW (ref 8.9–10.3)
Chloride: 96 mmol/L — ABNORMAL LOW (ref 98–111)
Creatinine, Ser: 13.82 mg/dL — ABNORMAL HIGH (ref 0.44–1.00)
GFR, Estimated: 2 mL/min — ABNORMAL LOW (ref >60)
Glucose, Bld: 71 mg/dL (ref 70–99)
Potassium: 6 mmol/L — ABNORMAL HIGH (ref 3.5–5.1)
Sodium: 138 mmol/L (ref 135–145)
Total Bilirubin: 1.6 mg/dL — ABNORMAL HIGH (ref 0.3–1.2)
Total Protein: 7.7 g/dL (ref 6.5–8.1)

## 2020-07-31 LAB — CBC
HCT: 37.1 % (ref 36.0–46.0)
Hemoglobin: 11.1 g/dL — ABNORMAL LOW (ref 12.0–15.0)
MCH: 30.3 pg (ref 26.0–34.0)
MCHC: 29.9 g/dL — ABNORMAL LOW (ref 30.0–36.0)
MCV: 101.4 fL — ABNORMAL HIGH (ref 80.0–100.0)
Platelets: 268 10*3/uL (ref 150–400)
RBC: 3.66 MIL/uL — ABNORMAL LOW (ref 3.87–5.11)
RDW: 14.3 % (ref 11.5–15.5)
WBC: 6.7 10*3/uL (ref 4.0–10.5)
nRBC: 0 % (ref 0.0–0.2)

## 2020-07-31 LAB — BRAIN NATRIURETIC PEPTIDE: B Natriuretic Peptide: 448 pg/mL — ABNORMAL HIGH (ref 0.0–100.0)

## 2020-07-31 LAB — TROPONIN I (HIGH SENSITIVITY)
Troponin I (High Sensitivity): 95 ng/L — ABNORMAL HIGH (ref <18)
Troponin I (High Sensitivity): 96 ng/L — ABNORMAL HIGH (ref <18)

## 2020-07-31 MED ORDER — APIXABAN 2.5 MG PO TABS
2.5000 mg | ORAL_TABLET | Freq: Two times a day (BID) | ORAL | Status: DC
  Administered 2020-08-01 – 2020-08-02 (×4): 2.5 mg via ORAL
  Filled 2020-07-31 (×4): qty 1

## 2020-07-31 MED ORDER — AMLODIPINE BESYLATE 5 MG PO TABS
5.0000 mg | ORAL_TABLET | Freq: Every day | ORAL | Status: DC
  Administered 2020-08-01 – 2020-08-02 (×2): 5 mg via ORAL
  Filled 2020-07-31 (×2): qty 1

## 2020-07-31 MED ORDER — METOPROLOL TARTRATE 25 MG PO TABS
12.5000 mg | ORAL_TABLET | Freq: Two times a day (BID) | ORAL | Status: DC
  Administered 2020-07-31 – 2020-08-02 (×4): 12.5 mg via ORAL
  Filled 2020-07-31 (×4): qty 1

## 2020-07-31 MED ORDER — ONDANSETRON HCL 4 MG/2ML IJ SOLN: 4.0000 mg | Freq: Four times a day (QID) | INTRAMUSCULAR | Status: DC | PRN

## 2020-07-31 MED ORDER — ACETAMINOPHEN 325 MG PO TABS
650.0000 mg | ORAL_TABLET | Freq: Four times a day (QID) | ORAL | Status: DC | PRN
  Administered 2020-07-31 – 2020-08-02 (×2): 650 mg via ORAL
  Filled 2020-07-31 (×2): qty 2

## 2020-07-31 MED ORDER — AMIODARONE HCL 200 MG PO TABS
200.0000 mg | ORAL_TABLET | ORAL | Status: DC
Start: 2020-08-01 — End: 2020-08-02
  Administered 2020-08-01 – 2020-08-02 (×2): 200 mg via ORAL
  Filled 2020-07-31 (×2): qty 1

## 2020-07-31 MED ORDER — ONDANSETRON HCL 4 MG PO TABS: 4.0000 mg | ORAL_TABLET | Freq: Four times a day (QID) | ORAL | Status: DC | PRN

## 2020-07-31 MED ORDER — ZINC SULFATE 220 (50 ZN) MG PO CAPS
220.0000 mg | ORAL_CAPSULE | Freq: Every day | ORAL | Status: DC
  Administered 2020-08-01 – 2020-08-02 (×3): 220 mg via ORAL
  Filled 2020-07-31 (×3): qty 1

## 2020-07-31 MED ORDER — CLOPIDOGREL BISULFATE 75 MG PO TABS
75.0000 mg | ORAL_TABLET | Freq: Every day | ORAL | Status: DC
  Administered 2020-08-01 – 2020-08-02 (×2): 75 mg via ORAL
  Filled 2020-07-31 (×2): qty 1

## 2020-07-31 MED ORDER — ASCORBIC ACID 500 MG PO TABS
500.0000 mg | ORAL_TABLET | Freq: Every day | ORAL | Status: DC
  Administered 2020-08-01 – 2020-08-02 (×3): 500 mg via ORAL
  Filled 2020-07-31 (×3): qty 1

## 2020-07-31 MED ORDER — ALBUTEROL SULFATE HFA 108 (90 BASE) MCG/ACT IN AERS
1.0000 | INHALATION_SPRAY | Freq: Four times a day (QID) | RESPIRATORY_TRACT | Status: DC | PRN
  Filled 2020-07-31: qty 6.7

## 2020-07-31 MED ORDER — ATORVASTATIN CALCIUM 20 MG PO TABS
20.0000 mg | ORAL_TABLET | Freq: Every evening | ORAL | Status: DC
  Administered 2020-08-01: 20 mg via ORAL
  Filled 2020-07-31: qty 1

## 2020-07-31 MED ORDER — SODIUM ZIRCONIUM CYCLOSILICATE 10 G PO PACK
10.0000 g | PACK | Freq: Once | ORAL | Status: AC
  Administered 2020-07-31: 10 g via ORAL
  Filled 2020-07-31: qty 1

## 2020-07-31 MED ORDER — CALCIUM GLUCONATE-NACL 1-0.675 GM/50ML-% IV SOLN
1.0000 g | Freq: Once | INTRAVENOUS | Status: AC
  Administered 2020-07-31: 1000 mg via INTRAVENOUS
  Filled 2020-07-31: qty 50

## 2020-07-31 MED ORDER — GUAIFENESIN-DM 100-10 MG/5ML PO SYRP: 10.0000 mL | ORAL_SOLUTION | ORAL | Status: DC | PRN

## 2020-07-31 NOTE — ED Notes (Signed)
Pt awake and alert oriented to person, place.  RR even and unlabored on 2L O2 via [place] with symmetrical rise and fall of chest - pt denies being O2 dependent however on RA pt O2 sats dropping to 80s.  Cardiac monitoring maintained;  NSR HR 67.  Abdomen soft nontender- denies nausea. [name]  BLE nonpitting edema with eschar noted to 2nd digit LLE and LLE great toe amputation -- pt other lower extremities digits have darkened discoloration in comparison to patient normal skin tone -- weak but palpable pedal pulses - pt reports chronic bialt foot pain and is asking for pain med - will pull prn Tylenol.  RUE arm restrict secondary to dialysis access - pt states last time she received dialysis was 5 days pta (missed dialysis 01/18 and 01/20) - nephr consult pending for inpt HD management.  Will monitor for acute changes and maintain plan of care

## 2020-07-31 NOTE — ED Notes (Signed)
22g to L upper arm placed but d/c'd shortly after insertion due to infiltration (able to obtain green top for pending trop prior to infiltration)

## 2020-07-31 NOTE — ED Provider Notes (Signed)
[facility] Emergency Department Provider Note  ____________________________________________   Event Date/Time   First MD Initiated Contact with Patient 07/31/20 1904     (approximate)  I have reviewed the triage vital signs and the nursing notes.   HISTORY  Chief Complaint Dialysis   HPI LE FERRAZ is a 87 y.o. female with a past medical history of HTN, DM, CHF, PAF, and ESRD typically on HD T TH S who presents for assessment via EMS from home having a several episodes of dialysis stating she needs to go to dialysis.  Patient reportedly was diagnosed also with COVID on 1/10.  She states she last went to dialysis on 1/15.  She states she has little bit of a cough but no significant shortness of breath, chest pain, headache, earache, sore throat, vomiting, diarrhea, abdominal pain, back pain, rash or acute extremity pain.  No other acute concerns at this time.  No recent falls or injuries.  She does note she has some chronic wounds in her feet.         Past Medical History:  Diagnosis Date  . (HFpEF) heart failure with preserved ejection fraction ([place])    a. 10/2017 Echo: EF 60-65%, Gr1 DD, mild MR, mildly dil LA/RA. PASP 67mHg.  . Arthritis   . Back pain   . Bronchitis   . Cataract   . Diabetes mellitus without complication (HCC)    no meds currently  . Dialysis patient ([place]   . Dysrhythmia   . ESRD (end stage renal disease) ([place]    a. 2018 - initially on HD but then transitioned to nightly PD in 08/2017.  .Marland KitchenGout   . History of methicillin resistant staphylococcus aureus (MRSA)   . Hypertension   . PAF (paroxysmal atrial fibrillation) (HCC)    a. CHA2DS2VASc = 5-->eliquis 2.5 BID.    Patient Active Problem List   Diagnosis Date Noted  . ESRD on dialysis ([place] 07/31/2020  . Hyperlipidemia associated with type 2 diabetes mellitus (HCamino 07/31/2020  . PVD (peripheral vascular disease) (H[place] 07/31/2020  . COVID-19 virus infection 07/31/2020   . Cough 08/20/2019  . Diabetic foot ulcer (HT[place] 06/04/2019  . Pain due to onychomycosis of toenails of both feet 03/05/2019  . Atherosclerosis of native arteries of the extremities with ulceration ([place] 03/05/2019  . Type 2 diabetes mellitus with vascular disease ([place] 03/05/2019  . Hyperkalemia 01/18/2019  . Complication from renal dialysis device 12/20/2018  . Hyperlipidemia 05/03/2018  . Chronic heart failure with preserved ejection fraction ([place] 12/28/2017  . 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 (H[place] 08/25/2017  . ESRD (end stage renal disease) (HC[place] 08/25/2017  . Paroxysmal atrial fibrillation ([place] 05/21/2017  . Hypertension associated with diabetes ([place] 05/21/2017  . Type 2 diabetes mellitus with chronic kidney disease on chronic dialysis, without long-term current use of insulin (HA[place] 05/21/2017  . Stage 5 chronic kidney disease on chronic dialysis (HA[place] 05/21/2017  . Acute hyperkalemia 04/27/2017    Past Surgical History:  Procedure Laterality Date  . A/V FISTULAGRAM Right 11/13/2019   Procedure: A/V FISTULAGRAM;  Surgeon: SKatha Cabal MD;  [facility];  Service: Cardiovascular;  Laterality: Right;  . AMPUTATION Left 01/09/2020   Procedure: AMPUTATION RAY ( GREAT TOE );  Surgeon: SKatha Cabal MD;  Location: [facility];  Service: Vascular;  Laterality: Left;  . AV FISTULA PLACEMENT Right 09/13/2018   Procedure: INSERTION OF ARTERIOVENOUS (AV) GORE-TEX GRAFT ARM;  Surgeon: SKatha Cabal  MD;  Location: [facility] OR[facility];  Service: Vascular;  Laterality: Right;  . CAPD INSERTION N/A 06/08/2017   Procedure: LAPAROSCOPIC INSERTION CONTINUOUS AMBULATORY PERITONEAL DIALYSIS  (CAPD) CATHETER;  Surgeon: Katha Cabal, MD;  Location: [facility];  Service: Vascular;  Laterality: N/A;  . CATARACT EXTRACTION, BILATERAL Bilateral   . DIALYSIS/PERMA CATHETER INSERTION N/A 03/18/2017   Procedure: DIALYSIS/PERMA CATHETER INSERTION;  Surgeon: Katha Cabal, MD;   Location: [place] [facility];  Service: Cardiovascular;  Laterality: N/A;  . DIALYSIS/PERMA CATHETER REMOVAL N/A 10/20/2017   Procedure: DIALYSIS/PERMA CATHETER REMOVAL;  Surgeon: Algernon Huxley, MD;  Location: [place] [facility];  Service: Cardiovascular;  Laterality: N/A;  . EYE SURGERY    . LOWER EXTREMITY ANGIOGRAPHY Left 09/11/2019   Procedure: LOWER EXTREMITY ANGIOGRAPHY;  Surgeon: Katha Cabal, MD;  Location: [place] [facility];  Service: Cardiovascular;  Laterality: Left;  . LOWER EXTREMITY ANGIOGRAPHY Right 10/17/2019   Procedure: LOWER EXTREMITY ANGIOGRAPHY;  Surgeon: Katha Cabal, MD;  Location: [place] [facility];  Service: Cardiovascular;  Laterality: Right;  . LOWER EXTREMITY ANGIOGRAPHY Left 11/20/2019   Procedure: LOWER EXTREMITY ANGIOGRAPHY;  Surgeon: Katha Cabal, MD;  Location: [place] [facility];  Service: Cardiovascular;  Laterality: Left;  . PERIPHERAL VASCULAR THROMBECTOMY Right 03/12/2020   Procedure: PERIPHERAL VASCULAR THROMBECTOMY;  Surgeon: Katha Cabal, MD;  Location: [place] [facility];  Service: Cardiovascular;  Laterality: Right;  . REMOVAL OF A DIALYSIS CATHETER N/A 02/23/2019   Procedure: REMOVAL OF A DIALYSIS CATHETER ( PD CATH);  Surgeon: Katha Cabal, MD;  Location: [facility];  Service: Vascular;  Laterality: N/A;  . TEMPORARY DIALYSIS CATHETER  03/11/2020   Procedure: TEMPORARY DIALYSIS CATHETER;  Surgeon: Katha Cabal, MD;  Location: [place] [facility];  Service: Cardiovascular;;    Prior to Admission medications   Medication Sig Start Date End Date Taking? Authorizing Provider  acetaminophen (TYLENOL) 650 MG CR tablet Take 650-1,300 mg by mouth every 8 (eight) hours as needed for pain.    [provider]  allopurinol (ZYLOPRIM) 100 MG tablet TAKE 1 TABLET BY MOUTH EVERYDAY AT BEDTIME Patient taking differently: Take 100 mg by mouth at bedtime. 09/12/19   Elby Beck, FNP  amiodarone (PACERONE) 200 MG  tablet Take 1 tablet (200 mg total) by mouth every morning. Take 1 tablet (200 mg) by mouth once daily Patient taking differently: Take 200 mg by mouth every morning. 10/24/19   Theora Gianotti, NP  amLODipine (NORVASC) 5 MG tablet TAKE 1 TABLET BY MOUTH EVERY DAY Patient taking differently: Take 5 mg by mouth daily. 05/12/20   Elby Beck, FNP  atorvastatin (LIPITOR) 20 MG tablet TAKE 1 TABLET BY MOUTH EVERY DAY IN THE EVENING Patient taking differently: Take 20 mg by mouth every evening. 07/03/20   Elby Beck, FNP  blood glucose meter kit and supplies KIT Please dispense either One Touch or Bayer Contour She must have one of these machines since she is on peritoneal dialysis. Use up to four times daily as directed. (FOR ICD-9 250.00, 250.01). 09/23/17   Elby Beck, FNP  clopidogrel (PLAVIX) 75 MG tablet TAKE 1 TABLET BY MOUTH EVERY DAY Patient taking differently: Take 75 mg by mouth daily. 05/09/20   Schnier, Dolores Lory, MD  colchicine 0.6 MG tablet Take 0.6 mg by mouth daily as needed (gout).    [provider]  ELIQUIS 2.5 MG TABS tablet TAKE 1 TABLET BY MOUTH TWICE A DAY  Patient taking differently: Take 2.5 mg by mouth 2 (two) times daily. 06/26/20   Elby Beck, FNP  ferric citrate (AURYXIA) 1 GM 210 MG(Fe) tablet Take 210 mg by mouth 3 (three) times daily with meals.    [provider]  HYDROcodone-acetaminophen (NORCO) 5-325 MG tablet Take 1 tablet by mouth every 6 (six) hours as needed for moderate pain or severe pain. Patient taking differently: Take 1-2 tablets by mouth every 6 (six) hours as needed for moderate pain or severe pain. 06/23/20   Schnier, Dolores Lory, MD  metoprolol tartrate (LOPRESSOR) 25 MG tablet Take 0.5 tablets (12.5 mg total) by mouth 2 (two) times daily. 06/23/20   Schnier, Dolores Lory, MD    Allergies Patient has no known allergies.  Family History  Problem Relation Age of Onset  . Hypertension Father      Social History Social History   Tobacco Use  . Smoking status: Never Smoker  . Smokeless tobacco: Never Used  Vaping Use  . Vaping Use: Never used  Substance Use Topics  . Alcohol use: No  . Drug use: No    Review of Systems  Review of Systems  Constitutional: Negative for chills and fever.  HENT: Negative for sore throat.   Eyes: Negative for pain.  Respiratory: Positive for cough. Negative for stridor.   Cardiovascular: Negative for chest pain.  Gastrointestinal: Negative for vomiting.  Genitourinary: Negative for dysuria.  Musculoskeletal: Positive for joint pain ( b/l toes) and myalgias ( b/l feet).  Skin: Negative for rash.  Neurological: Negative for seizures, loss of consciousness and headaches.  Psychiatric/Behavioral: Negative for suicidal ideas.  All other systems reviewed and are negative.     ____________________________________________   PHYSICAL EXAM:  VITAL SIGNS: ED Triage Vitals  Enc Vitals Group     BP 07/31/20 1421 137/75     Pulse Rate 07/31/20 1421 67     Resp 07/31/20 1421 18     Temp 07/31/20 1421 98.7 F (37.1 C)     Temp Source 07/31/20 1421 Oral     SpO2 07/31/20 1421 96 %     Weight 07/31/20 1422 150 lb (68 kg)     Height 07/31/20 1422 '5\' 3"'  (1.6 m)     Head Circumference --      Peak Flow --      Pain Score 07/31/20 1422 0     Pain Loc --      Pain Edu? --      Excl. in [place]? --    Vitals:   07/31/20 1421 07/31/20 1808  BP: 137/75 (!) 158/71  Pulse: 67 64  Resp: 18 16  Temp: 98.7 F (37.1 C)   SpO2: 96% 98%   Physical Exam Vitals and nursing note reviewed.  Constitutional:      General: She is not in acute distress.    Appearance: She is well-developed and well-nourished.  HENT:     Head: Normocephalic and atraumatic.     Right Ear: External ear normal.     Left Ear: External ear normal.     Nose: Nose normal.  Eyes:     Conjunctiva/sclera: Conjunctivae normal.  Cardiovascular:     Rate and Rhythm: Normal  rate and regular rhythm.     Pulses: Normal pulses.     Heart sounds: No murmur heard.   Pulmonary:     Effort: Pulmonary effort is normal. No respiratory distress.     Breath sounds: Normal breath sounds.  Abdominal:  Palpations: Abdomen is soft.     Tenderness: There is no abdominal tenderness.  Musculoskeletal:        General: No edema.     Cervical back: Neck supple.  Skin:    General: Skin is warm and dry.     Capillary Refill: Capillary refill takes less than 2 seconds.  Neurological:     Mental Status: She is alert.  Psychiatric:        Mood and Affect: Mood and affect normal.      ____________________________________________   LABS (all labs ordered are listed, but only abnormal results are displayed)  Labs Reviewed  COMPREHENSIVE METABOLIC PANEL - Abnormal; Notable for the following components:      Result Value   Potassium 6.0 (*)    Chloride 96 (*)    BUN 61 (*)    Creatinine, Ser 13.82 (*)    Calcium 8.6 (*)    Albumin 3.1 (*)    AST 266 (*)    ALT 178 (*)    Total Bilirubin 1.6 (*)    GFR, Estimated 2 (*)    Anion gap 20 (*)    All other components within normal limits  CBC - Abnormal; Notable for the following components:   RBC 3.66 (*)    Hemoglobin 11.1 (*)    MCV 101.4 (*)    MCHC 29.9 (*)    All other components within normal limits  BRAIN NATRIURETIC PEPTIDE - Abnormal; Notable for the following components:   B Natriuretic Peptide 448.0 (*)    All other components within normal limits  TROPONIN I (HIGH SENSITIVITY)   ____________________________________________  EKG  Sinus rhythm with ventricular rate of 68, left anterior fascicle block, left axis deviation, no clear evidence of acute ischemia or other significant underlying arrhythmia. ____________________________________________  RADIOLOGY  ED MD interpretation: Some bilateral patchy opacities consistent with COVID-pneumonia versus some early edema.  No large focal consolidation,  large effusion, pneumothorax or other clear acute intrathoracic pathology.   Official radiology report(s): DG Chest 1 View  Result Date: 07/31/2020 CLINICAL DATA:  Hypoxia, history of recent COVID-19 positivity EXAM: CHEST  1 VIEW COMPARISON:  03/11/2020 FINDINGS: Cardiac shadow is enlarged but stable. Aortic calcifications are seen. Patchy airspace disease is noted bilaterally consistent with the given clinical history. No sizable effusion is seen. No bony abnormality is noted. IMPRESSION: Patchy airspace opacity consistent with the given clinical history of COVID-19 positivity. Electronically Signed   By: Inez Catalina M.D.   On: 07/31/2020 19:32    ____________________________________________   PROCEDURES  Procedure(s) performed (including Critical Care):  .1-3 Lead EKG Interpretation Performed by: Lucrezia Starch, MD Authorized by: Lucrezia Starch, MD     Interpretation: normal     ECG rate assessment: normal     Rhythm: sinus rhythm     Ectopy: none     Conduction: normal    .Critical Care Performed by: Lucrezia Starch, MD Authorized by: Lucrezia Starch, MD   Critical care provider statement:    Critical care time (minutes):  45   Critical care was necessary to treat or prevent imminent or life-threatening deterioration of the following conditions:  Metabolic crisis   Critical care was time spent personally by me on the following activities:  Discussions with consultants, evaluation of patient's response to treatment, examination of patient, ordering and performing treatments and interventions, ordering and review of laboratory studies, ordering and review of radiographic studies, pulse oximetry, re-evaluation of patient's condition, obtaining history  from patient or surrogate and review of old charts     ____________________________________________   INITIAL IMPRESSION / ASSESSMENT AND PLAN / ED COURSE      Patient presents due to concerns that she has been to  several dialysis episodes in the setting of some recent including motor and recently being diagnosed with COVID.  She endorses a mild cough but denies any other acute sick symptoms.  On arrival she is slightly hypertensive with BP of 158 with otherwise stable vital signs on room air.  No evidence of hypoxic respiratory failure.  Patient on room air is noted to maintain her sats 92 to 93%.  Chest x-ray does not show evidence of large effusion or pneumothorax or consolidation but does show some early edema and some patchiness consistent with recent COVID-19 infection.  CMP remarkable for K of 6 as well as a creatinine of 13.82 compared to 5.9 recently before that and anion gap of 20.  Patient's BUN is 61 I suspect her anion gap is related to missed dialysis and elevated BUN and the low suspicion for lactic acidosis or sepsis at this time.  Patient does have a mild transaminitis that otherwise no significant electrolyte or metabolic derangements.  BNP is elevated at 448.  I suspect this is related to volume overload in setting of missed dialysis.  Did discuss patient with on-call nephrologist Dr. Holley Raring who agreed patient did not warrant emergent dialysis but they will attempt to do dialysis tomorrow.  Given patient is unable to get outpatient dialysis tomorrow morning and has a K of 6 as well as anion gap of 20 she will be admitted to hospital service for observation overnight with plan for dialysis tomorrow.  Admitted in stable condition.  ____________________________________________   FINAL CLINICAL IMPRESSION(S) / ED DIAGNOSES  Final diagnoses:  ESRD (end stage renal disease) (HCC)  Hyperkalemia  COVID  Elevated brain natriuretic peptide (BNP) level  High anion gap metabolic acidosis  Elevated BUN    Medications  calcium gluconate 1 g/ 50 mL sodium chloride IVPB (has no administration in time range)  sodium zirconium cyclosilicate (LOKELMA) packet 10 g (has no administration in time range)      ED Discharge Orders    None       Note:  This document was prepared using Dragon voice recognition software and may include unintentional dictation errors.   Lucrezia Starch, MD 07/31/20 2019

## 2020-07-31 NOTE — ED Notes (Signed)
IV team consult ordered - now awaiting IV team for saline lock placement

## 2020-07-31 NOTE — H&P (Signed)
History and Physical    LIBERTI APPLETON PTW:[id number] DOB: [birth year] DOA: 07/31/2020  PCP: Elby Beck, FNP (Inactive)  Patient coming from: Home via EMS  I have personally briefly reviewed patient's old medical records in [place]  Chief Complaint: Missed dialysis  HPI: Stacy Pham is a 87 y.o. female with medical history significant for ESRD on TTS HD, PAF on Eliquis and amiodarone, PVD on Plavix with chronic ulceration/ischemia to toes of both feet, diet-controlled T2DM, HTN, HLD who presents to the ED for dialysis needs.  Of note, patient was incidentally found to be COVID-19 positive on 07/21/2020 when preop testing was performed for planned RLE angiography by vascular surgery, Dr. Delana Meyer.  Angiography was subsequently canceled due to positive COVID status. Patient states that since then she has been having nonproductive cough and occasional runny nose.  She otherwise denies any shortness of breath, subjective fevers, chills, diaphoresis, nausea, vomiting, abdominal pain, or diarrhea.  She says she no longer makes urine.  She says she missed dialysis this past Tuesday 1/18 due to the weather.  Family were unable to take her to dialysis today.  Instead EMS were called and she was brought to the ED for further dialysis needs.  Patient has severe peripheral vascular disease with chronic ulceration and ischemic changes to the toes of both feet.  She had prior right great toe right amputation.  ED Course:  Initial vitals showed BP 137/75, pulse 67, RR 18, temp 98.7 F, SPO2 96% on 3 L supplemental O2 via [place].  Per ED notes, patient was 88% on room air at home by EMS.  Labs show sodium 138, potassium 6.0 (possible hemolysis), bicarb 22, BUN 61, creatinine 13.82, AST 266, ALT 178, alk phos 90, total bilirubin 1.6, WBC 6.7, hemoglobin 11.1, platelets 268,000, BMP 448.  Portable chest x-ray shows patchy airspace opacities bilaterally.  Patient was given Lokelma and IV  calcium gluconate.  The hospitalist service was consulted to admit for further evaluation and management.  Review of Systems: All systems reviewed and are negative except as documented in history of present illness above.   Past Medical History:  Diagnosis Date  . (HFpEF) heart failure with preserved ejection fraction ([place])    a. 10/2017 Echo: EF 60-65%, Gr1 DD, mild MR, mildly dil LA/RA. PASP 17mHg.  . Arthritis   . Back pain   . Bronchitis   . Cataract   . Diabetes mellitus without complication (HCC)    no meds currently  . [place]   . Dysrhythmia   . ESRD (end stage renal disease) ([place]    a. 2018 - initially on HD but then transitioned to nightly PD in 08/2017.  .Marland KitchenGout   . History of methicillin resistant staphylococcus aureus (MRSA)   . Hypertension   . PAF (paroxysmal atrial fibrillation) (HCC)    a. CHA2DS2VASc = 5-->eliquis 2.5 BID.    Past Surgical History:  Procedure Laterality Date  . A/V FISTULAGRAM Right 11/13/2019   Procedure: A/V FISTULAGRAM;  Surgeon: SKatha Cabal MD;  [facility];  Service: Cardiovascular;  Laterality: Right;  . AMPUTATION Left 01/09/2020   Procedure: AMPUTATION RAY ( GREAT TOE );  Surgeon: SKatha Cabal MD;  Location: [facility];  Service: Vascular;  Laterality: Left;  . AV FISTULA PLACEMENT Right 09/13/2018   Procedure: INSERTION OF ARTERIOVENOUS (AV) GORE-TEX GRAFT ARM;  Surgeon: SKatha Cabal MD;  Location: [facility];  Service: Vascular;  Laterality: Right;  .  CAPD INSERTION N/A 06/08/2017   Procedure: LAPAROSCOPIC INSERTION CONTINUOUS AMBULATORY PERITONEAL DIALYSIS  (CAPD) CATHETER;  Surgeon: Katha Cabal, MD;  Location: [facility];  Service: Vascular;  Laterality: N/A;  . CATARACT EXTRACTION, BILATERAL Bilateral   . DIALYSIS/PERMA CATHETER INSERTION N/A 03/18/2017   Procedure: DIALYSIS/PERMA CATHETER INSERTION;  Surgeon: Katha Cabal, MD;  Location: [place] [facility];  Service:  Cardiovascular;  Laterality: N/A;  . DIALYSIS/PERMA CATHETER REMOVAL N/A 10/20/2017   Procedure: DIALYSIS/PERMA CATHETER REMOVAL;  Surgeon: Algernon Huxley, MD;  Location: [place] [facility];  Service: Cardiovascular;  Laterality: N/A;  . EYE SURGERY    . LOWER EXTREMITY ANGIOGRAPHY Left 09/11/2019   Procedure: LOWER EXTREMITY ANGIOGRAPHY;  Surgeon: Katha Cabal, MD;  Location: [place] [facility];  Service: Cardiovascular;  Laterality: Left;  . LOWER EXTREMITY ANGIOGRAPHY Right 10/17/2019   Procedure: LOWER EXTREMITY ANGIOGRAPHY;  Surgeon: Katha Cabal, MD;  Location: [place] [facility];  Service: Cardiovascular;  Laterality: Right;  . LOWER EXTREMITY ANGIOGRAPHY Left 11/20/2019   Procedure: LOWER EXTREMITY ANGIOGRAPHY;  Surgeon: Katha Cabal, MD;  Location: [place] [facility];  Service: Cardiovascular;  Laterality: Left;  . PERIPHERAL VASCULAR THROMBECTOMY Right 03/12/2020   Procedure: PERIPHERAL VASCULAR THROMBECTOMY;  Surgeon: Katha Cabal, MD;  Location: [place] [facility];  Service: Cardiovascular;  Laterality: Right;  . REMOVAL OF A DIALYSIS CATHETER N/A 02/23/2019   Procedure: REMOVAL OF A DIALYSIS CATHETER ( PD CATH);  Surgeon: Katha Cabal, MD;  Location: [facility];  Service: Vascular;  Laterality: N/A;  . TEMPORARY DIALYSIS CATHETER  03/11/2020   Procedure: TEMPORARY DIALYSIS CATHETER;  Surgeon: Katha Cabal, MD;  Location: [place] [facility];  Service: Cardiovascular;;    Social History:  reports that she has never smoked. She has never used smokeless tobacco. She reports that she does not drink alcohol and does not use drugs.  No Known Allergies  Family History  Problem Relation Age of Onset  . Hypertension Father      Prior to Admission medications   Medication Sig Start Date End Date Taking? Authorizing Provider  acetaminophen (TYLENOL) 650 MG CR tablet Take 650-1,300 mg by mouth every 8 (eight) hours as needed for pain.    [provider]  allopurinol (ZYLOPRIM) 100 MG tablet TAKE 1 TABLET BY MOUTH EVERYDAY AT BEDTIME Patient taking differently: Take 100 mg by mouth at bedtime. 09/12/19   Elby Beck, FNP  amiodarone (PACERONE) 200 MG tablet Take 1 tablet (200 mg total) by mouth every morning. Take 1 tablet (200 mg) by mouth once daily Patient taking differently: Take 200 mg by mouth every morning. 10/24/19   Theora Gianotti, NP  amLODipine (NORVASC) 5 MG tablet TAKE 1 TABLET BY MOUTH EVERY DAY Patient taking differently: Take 5 mg by mouth daily. 05/12/20   Elby Beck, FNP  atorvastatin (LIPITOR) 20 MG tablet TAKE 1 TABLET BY MOUTH EVERY DAY IN THE EVENING Patient taking differently: Take 20 mg by mouth every evening. 07/03/20   Elby Beck, FNP  blood glucose meter kit and supplies KIT Please dispense either One Touch or Bayer Contour She must have one of these machines since she is on peritoneal dialysis. Use up to four times daily as directed. (FOR ICD-9 250.00, 250.01). 09/23/17   Elby Beck, FNP  clopidogrel (PLAVIX) 75 MG tablet TAKE 1 TABLET BY MOUTH EVERY DAY Patient taking differently: Take 75 mg by mouth daily. 05/09/20   Schnier,  Dolores Lory, MD  colchicine 0.6 MG tablet Take 0.6 mg by mouth daily as needed (gout).    [provider]  ELIQUIS 2.5 MG TABS tablet TAKE 1 TABLET BY MOUTH TWICE A DAY Patient taking differently: Take 2.5 mg by mouth 2 (two) times daily. 06/26/20   Elby Beck, FNP  ferric citrate (AURYXIA) 1 GM 210 MG(Fe) tablet Take 210 mg by mouth 3 (three) times daily with meals.    [provider]  HYDROcodone-acetaminophen (NORCO) 5-325 MG tablet Take 1 tablet by mouth every 6 (six) hours as needed for moderate pain or severe pain. Patient taking differently: Take 1-2 tablets by mouth every 6 (six) hours as needed for moderate pain or severe pain. 06/23/20   Schnier, Dolores Lory, MD  metoprolol tartrate (LOPRESSOR) 25 MG tablet Take  0.5 tablets (12.5 mg total) by mouth 2 (two) times daily. 06/23/20   Schnier, Dolores Lory, MD    Physical Exam: Vitals:   07/31/20 1421 07/31/20 1422 07/31/20 1808  BP: 137/75  (!) 158/71  Pulse: 67  64  Resp: 18  16  Temp: 98.7 F (37.1 C)    TempSrc: Oral    SpO2: 96%  98%  Weight:  68 kg   Height:  '5\' 3"'  (1.6 m)    Constitutional: Elderly woman resting in bed with head elevated, NAD, calm, comfortable Eyes: PERRL, lids and conjunctivae normal ENMT: Mucous membranes are moist. Posterior pharynx clear of any exudate or lesions.Normal dentition.  Neck: normal, supple, no masses. Respiratory: clear to auscultation bilaterally, no wheezing, no crackles. Normal respiratory effort. No accessory muscle use.  Cardiovascular: Regular rate and rhythm, no murmurs / rubs / gallops.  Trace bilateral lower extremity edema.  Unable to palpate pedal pulses.  RUE aVF present. Abdomen: no tenderness, no masses palpated. No hepatosplenomegaly. Bowel sounds positive.  Musculoskeletal: Dry necrotic appearing toes of bilateral feet, s/p right first toe amputation.  Moving all extremities equally. Skin: Dry necrotic appearance of toes bilateral feet.  No open wounds or discharge, no erythema. Neurologic: CN 2-12 grossly intact. Sensation diminished both feet,  Strength 5/5 in all 4.  Psychiatric: Alert and oriented x 3. Normal mood.   Labs on Admission: I have personally reviewed following labs and imaging studies  CBC: Recent Labs  Lab 07/31/20 1430  WBC 6.7  HGB 11.1*  HCT 37.1  MCV 101.4*  PLT 856   Basic Metabolic Panel: Recent Labs  Lab 07/31/20 1430  NA 138  K 6.0*  CL 96*  CO2 22  GLUCOSE 71  BUN 61*  CREATININE 13.82*  CALCIUM 8.6*   GFR: Estimated Creatinine Clearance: 2.7 mL/min (A) (by C-G formula based on SCr of 13.82 mg/dL (H)). Liver Function Tests: Recent Labs  Lab 07/31/20 1430  AST 266*  ALT 178*  ALKPHOS 90  BILITOT 1.6*  PROT 7.7  ALBUMIN 3.1*   No  results for input(s): LIPASE, AMYLASE in the last 168 hours. No results for input(s): AMMONIA in the last 168 hours. Coagulation Profile: No results for input(s): INR, PROTIME in the last 168 hours. Cardiac Enzymes: No results for input(s): CKTOTAL, CKMB, CKMBINDEX, TROPONINI in the last 168 hours. BNP (last 3 results) No results for input(s): PROBNP in the last 8760 hours. HbA1C: No results for input(s): HGBA1C in the last 72 hours. CBG: No results for input(s): GLUCAP in the last 168 hours. Lipid Profile: No results for input(s): CHOL, HDL, LDLCALC, TRIG, CHOLHDL, LDLDIRECT in the last 72 hours. Thyroid Function  Tests: No results for input(s): TSH, T4TOTAL, FREET4, T3FREE, THYROIDAB in the last 72 hours. Anemia Panel: No results for input(s): VITAMINB12, FOLATE, FERRITIN, TIBC, IRON, RETICCTPCT in the last 72 hours. Urine analysis: No results found for: COLORURINE, APPEARANCEUR, LABSPEC, [place], GLUCOSEU, HGBUR, BILIRUBINUR, KETONESUR, PROTEINUR, UROBILINOGEN, NITRITE, LEUKOCYTESUR  Radiological Exams on Admission: DG Chest 1 View  Result Date: 07/31/2020 CLINICAL DATA:  Hypoxia, history of recent COVID-19 positivity EXAM: CHEST  1 VIEW COMPARISON:  03/11/2020 FINDINGS: Cardiac shadow is enlarged but stable. Aortic calcifications are seen. Patchy airspace disease is noted bilaterally consistent with the given clinical history. No sizable effusion is seen. No bony abnormality is noted. IMPRESSION: Patchy airspace opacity consistent with the given clinical history of COVID-19 positivity. Electronically Signed   By: Inez Catalina M.D.   On: 07/31/2020 19:32    EKG: Personally reviewed. Sinus rhythm without acute ischemic changes, first-degree AV block.  Not significantly changed when compared to prior.  Assessment/Plan Principal Problem:   ESRD on dialysis [facility]) Active Problems:   Paroxysmal atrial fibrillation ([place])   Hypertension associated with diabetes ([place])   Type 2 diabetes  mellitus with chronic kidney disease on chronic dialysis, without long-term current use of insulin ([place])   Hyperlipidemia associated with type 2 diabetes mellitus ([place])   PVD (peripheral vascular disease) ([place])   COVID-19 virus infection  Stacy Pham is a 87 y.o. female with medical history significant for ESRD on TTS HD, PAF on Eliquis and amiodarone, PVD on Plavix with chronic ulceration/ischemia to toes of both feet, diet-controlled T2DM, HTN, HLD who is admitted for dialysis needs.  ESRD TTS HD: Patient missed last 2 dialysis sessions with reported last dialysis completed 07/26/2020.  Potassium elevated on initial labs however may be due to hemolysis.  Currently no emergent need for HD.  Consult to nephrology placed.  Positive COVID-19: SARS-CoV-2 PCR + 07/21/2020.  Has mild symptoms with cough and rhinorrhea.  Denies any shortness of breath.  SPO2 maintains around 98% on room air during my examination.  CXR with question of patchy opacities.  Overall stable without indication for treatment at this time.  Remain on airborne precautions for now.  Peripheral vascular disease with chronic ischemia/necrosis to toes of bilateral feet: Follows with vascular surgery, Dr. Delana Meyer.  Planned angiography earlier this month was canceled due to positive COVID-19 test. -Continue Plavix, statin, Lopressor  Paroxysmal atrial fibrillation: In sinus rhythm on admission.  Continue Eliquis, amiodarone, and Lopressor.  Elevated troponin: Troponin mildly elevated at 95.  Patient denies any chest pain.  No acute ischemic changes on EKG.  Continue to monitor.  Hypertension: Continue amlodipine, Lopressor.  Diet controlled type 2 diabetes: Continue to monitor.  Hyperlipidemia: Continue atorvastatin.  DVT prophylaxis: Eliquis Code Status: Full code Family Communication: Discussed with patient Disposition Plan: From home and likely discharge to home pending dialysis Consults called:  Nephrology Admission status:  Status is: Observation  The patient remains OBS appropriate and will d/c before 2 midnights.  Dispo: The patient is from: Home              Anticipated d/c is to: Home              Anticipated d/c date is: 1 day              Patient currently is not medically stable to d/c.  Zada Finders MD [facility]  If 7PM-7AM, please contact night-coverage [email or web address]  07/31/2020, 8:06 PM

## 2020-07-31 NOTE — ED Triage Notes (Signed)
First Nurse Note:  [facility] with [facility]. Arrives from home.  Missed dialysis on Tuesday due to weather.  Daughter was unable to get patient to treatment today.  Here for dialysis.  Per report VS wnl.    COVID + 07/21/2020.  90-92% on 3l/ [place].  Initial sats at home 88% on RA

## 2020-07-31 NOTE — ED Notes (Signed)
Pt provided sandwich with applesauce and graham crackers to eat after reporting feeling hungry

## 2020-07-31 NOTE — ED Triage Notes (Addendum)
Pt comes into the ED via [facility] from home c/o need for dialysis.  Pt missed dialysis on Tuesday due to the weather, and family was unable to transport her to dialysis today.  Pt is currently COVID + as of 07/21/20.  Initial room air sats at home were 88%.  Pt placed on 3L and brought up into the 90's.  Pt denies any other needs and denies any other complaints.

## 2020-08-01 ENCOUNTER — Other Ambulatory Visit: Payer: Self-pay

## 2020-08-01 ENCOUNTER — Encounter: Payer: Self-pay | Admitting: Internal Medicine

## 2020-08-01 DIAGNOSIS — E1159 Type 2 diabetes mellitus with other circulatory complications: Secondary | ICD-10-CM | POA: Diagnosis not present

## 2020-08-01 DIAGNOSIS — E785 Hyperlipidemia, unspecified: Secondary | ICD-10-CM

## 2020-08-01 DIAGNOSIS — U071 COVID-19: Secondary | ICD-10-CM | POA: Diagnosis not present

## 2020-08-01 DIAGNOSIS — N186 End stage renal disease: Secondary | ICD-10-CM | POA: Diagnosis not present

## 2020-08-01 DIAGNOSIS — D631 Anemia in chronic kidney disease: Secondary | ICD-10-CM | POA: Diagnosis not present

## 2020-08-01 DIAGNOSIS — Z992 Dependence on renal dialysis: Secondary | ICD-10-CM | POA: Diagnosis not present

## 2020-08-01 DIAGNOSIS — I152 Hypertension secondary to endocrine disorders: Secondary | ICD-10-CM | POA: Diagnosis not present

## 2020-08-01 DIAGNOSIS — N2581 Secondary hyperparathyroidism of renal origin: Secondary | ICD-10-CM | POA: Diagnosis not present

## 2020-08-01 DIAGNOSIS — E1169 Type 2 diabetes mellitus with other specified complication: Secondary | ICD-10-CM | POA: Diagnosis not present

## 2020-08-01 LAB — CBC WITH DIFFERENTIAL/PLATELET
Abs Immature Granulocytes: 0.06 10*3/uL (ref 0.00–0.07)
Basophils Absolute: 0.1 10*3/uL (ref 0.0–0.1)
Basophils Relative: 1 %
Eosinophils Absolute: 0 10*3/uL (ref 0.0–0.5)
Eosinophils Relative: 0 %
HCT: 34.5 % — ABNORMAL LOW (ref 36.0–46.0)
Hemoglobin: 10.8 g/dL — ABNORMAL LOW (ref 12.0–15.0)
Immature Granulocytes: 1 %
Lymphocytes Relative: 20 %
Lymphs Abs: 1.5 10*3/uL (ref 0.7–4.0)
MCH: 30.4 pg (ref 26.0–34.0)
MCHC: 31.3 g/dL (ref 30.0–36.0)
MCV: 97.2 fL (ref 80.0–100.0)
Monocytes Absolute: 1 10*3/uL (ref 0.1–1.0)
Monocytes Relative: 13 %
Neutro Abs: 5 10*3/uL (ref 1.7–7.7)
Neutrophils Relative %: 65 %
Platelets: 318 10*3/uL (ref 150–400)
RBC: 3.55 MIL/uL — ABNORMAL LOW (ref 3.87–5.11)
RDW: 14.3 % (ref 11.5–15.5)
WBC: 7.7 10*3/uL (ref 4.0–10.5)
nRBC: 0 % (ref 0.0–0.2)

## 2020-08-01 LAB — COMPREHENSIVE METABOLIC PANEL
ALT: 188 U/L — ABNORMAL HIGH (ref 0–44)
AST: 296 U/L — ABNORMAL HIGH (ref 15–41)
Albumin: 2.8 g/dL — ABNORMAL LOW (ref 3.5–5.0)
Alkaline Phosphatase: 92 U/L (ref 38–126)
Anion gap: 18 — ABNORMAL HIGH (ref 5–15)
BUN: 68 mg/dL — ABNORMAL HIGH (ref 8–23)
CO2: 22 mmol/L (ref 22–32)
Calcium: 8.8 mg/dL — ABNORMAL LOW (ref 8.9–10.3)
Chloride: 100 mmol/L (ref 98–111)
Creatinine, Ser: 14.93 mg/dL — ABNORMAL HIGH (ref 0.44–1.00)
GFR, Estimated: 2 mL/min — ABNORMAL LOW (ref >60)
Glucose, Bld: 93 mg/dL (ref 70–99)
Potassium: 4.9 mmol/L (ref 3.5–5.1)
Sodium: 140 mmol/L (ref 135–145)
Total Bilirubin: 1.1 mg/dL (ref 0.3–1.2)
Total Protein: 7 g/dL (ref 6.5–8.1)

## 2020-08-01 LAB — MRSA PCR SCREENING: MRSA by PCR: NEGATIVE

## 2020-08-01 MED ORDER — CHLORHEXIDINE GLUCONATE CLOTH 2 % EX PADS
6.0000 | MEDICATED_PAD | Freq: Every day | CUTANEOUS | Status: DC
  Administered 2020-08-02: 12:00:00 6 via TOPICAL

## 2020-08-01 MED ORDER — ORAL CARE MOUTH RINSE
15.0000 mL | Freq: Two times a day (BID) | OROMUCOSAL | Status: DC
  Administered 2020-08-01 – 2020-08-02 (×2): 15 mL via OROMUCOSAL

## 2020-08-01 NOTE — Progress Notes (Signed)
[facility]  ROUNDING NOTE   Subjective:  Patient well-known to [place] as we follow her for outpatient hemodialysis. Recently diagnosed with COVID-19 pneumonia. Missed recent outpatient dialysis treatment. This is reflected by elevated BUN of 68 and creatinine of 14.9. Potassium has come down from 6-4.9.  Objective:  Vital signs in last 24 hours:  Temp:  [97.9 F (36.6 C)-99.4 F (37.4 C)] 98.8 F (37.1 C) (01/21 1134) Pulse Rate:  [53-66] 57 (01/21 1134) Resp:  [16-24] 16 (01/21 1134) BP: (158-200)/(49-74) 170/74 (01/21 1134) SpO2:  [92 %-100 %] 92 % (01/21 1134) Weight:  [69.4 kg] 69.4 kg (01/21 0228)  Weight change:  Filed Weights   07/31/20 1422 08/01/20 0228  Weight: 68 kg 69.4 kg    Intake/Output: No intake/output data recorded.   Intake/Output this shift:  No intake/output data recorded.  Physical Exam: General:  No acute distress  Head:  Normocephalic, atraumatic. Moist oral mucosal membranes  Eyes:  Anicteric  Neck:  Supple  Lungs:   Normal effort  Heart:  Irregular  Abdomen:   Soft, nontender, bowel sounds present  Extremities:  1+ peripheral edema.  Neurologic:  Awake, alert, following commands  Skin:  No lesions  Access:  Right upper extremity AV graft    Basic Metabolic Panel: Recent Labs  Lab 07/31/20 1430 08/01/20 0557  NA 138 140  K 6.0* 4.9  CL 96* 100  CO2 22 22  GLUCOSE 71 93  BUN 61* 68*  CREATININE 13.82* 14.93*  CALCIUM 8.6* 8.8*    Liver Function Tests: Recent Labs  Lab 07/31/20 1430 08/01/20 0557  AST 266* 296*  ALT 178* 188*  ALKPHOS 90 92  BILITOT 1.6* 1.1  PROT 7.7 7.0  ALBUMIN 3.1* 2.8*   No results for input(s): LIPASE, AMYLASE in the last 168 hours. No results for input(s): AMMONIA in the last 168 hours.  CBC: Recent Labs  Lab 07/31/20 1430 08/01/20 0557  WBC 6.7 7.7  NEUTROABS  --  5.0  HGB 11.1* 10.8*  HCT 37.1 34.5*  MCV 101.4* 97.2  PLT 268 318    Cardiac Enzymes: No results for  input(s): CKTOTAL, CKMB, CKMBINDEX, TROPONINI in the last 168 hours.  BNP: Invalid input(s): POCBNP  CBG: No results for input(s): GLUCAP in the last 168 hours.  Microbiology: Results for orders placed or performed during the hospital encounter of 07/31/20  MRSA PCR Screening     Status: None   Collection Time: 08/01/20  2:34 AM   Specimen: Nasopharyngeal  Result Value Ref Range Status   MRSA by PCR NEGATIVE NEGATIVE Final    Comment:        The GeneXpert MRSA Assay (FDA approved for NASAL specimens only), is one component of a comprehensive MRSA colonization surveillance program. It is not intended to diagnose MRSA infection nor to guide or monitor treatment for MRSA infections. Performed at [facility], [address] [place], [place] [address]     Coagulation Studies: No results for input(s): LABPROT, INR in the last 72 hours.  Urinalysis: No results for input(s): COLORURINE, LABSPEC, PHURINE, GLUCOSEU, HGBUR, BILIRUBINUR, KETONESUR, PROTEINUR, UROBILINOGEN, NITRITE, LEUKOCYTESUR in the last 72 hours.  Invalid input(s): APPERANCEUR    Imaging: DG Chest 1 View  Result Date: 07/31/2020 CLINICAL DATA:  Hypoxia, history of recent COVID-19 positivity EXAM: CHEST  1 VIEW COMPARISON:  03/11/2020 FINDINGS: Cardiac shadow is enlarged but stable. Aortic calcifications are seen. Patchy airspace disease is noted bilaterally consistent with the given clinical history. No  sizable effusion is seen. No bony abnormality is noted. IMPRESSION: Patchy airspace opacity consistent with the given clinical history of COVID-19 positivity. Electronically Signed   By: Inez Catalina M.D.   On: 07/31/2020 19:32     Medications:     amiodarone  200 mg Oral [id number]   amLODipine  5 mg Oral Daily   apixaban  2.5 mg Oral BID   vitamin C  500 mg Oral Daily   atorvastatin  20 mg Oral QPM   clopidogrel  75 mg Oral Daily   mouth rinse  15 mL Mouth Rinse BID   metoprolol tartrate   12.5 mg Oral BID   zinc sulfate  220 mg Oral Daily   acetaminophen, albuterol, guaiFENesin-dextromethorphan, ondansetron **OR** ondansetron (ZOFRAN) IV  Assessment/ Plan:  87 y.o. female with past medical history of ESRD on HD, paroxysmal atrial fibrillation, peripheral vascular disease, diabetes mellitus type 2, pretension, hyperlipidemia, anemia of chronic kidney disease, secondary hyperparathyroidism who was admitted for missed dialysis treatment.  1.  ESRD on HD TTS at [place] unit.  Patient missed dialysis on Tuesday and Thursday.  Therefore we will plan for hemodialysis treatment today.  If she still here tomorrow she will require dialysis tomorrow as well.  2.  Anemia of chronic kidney disease.  Hemoglobin 10.8.  Hold off on Epogen at the moment.  3.  Secondary hyperparathyroidism.  Not currently on binders therapy.  Continue periodically monitor bone mineral metabolism parameters.  4.  COVID-19 pneumonia.  Does not appear to have any worsening shortness of breath at the moment.   LOS: 0 Titan Karner 1/21/20222:38 PM

## 2020-08-01 NOTE — Care Management Obs Status (Signed)
[facility] NOTIFICATION   Patient Details  Name: Stacy Pham MRN: [id number] Date of Birth: 1930-10-06   Medicare Observation Status Notification Given:  Yes    Shelbie Hutching, RN 08/01/2020, 3:01 PM

## 2020-08-01 NOTE — TOC Initial Note (Signed)
Transition of Care [facility]) - Initial/Assessment Note    Patient Details  Name: Stacy Pham MRN: [id number] Date of Birth: [birth year]  Transition of Care [facility]) CM/SW Contact:    Shelbie Hutching, RN Phone Number: 08/01/2020, 3:08 PM  Clinical Narrative:                 Patient is from home where she lives with her daughter, [name].  Patient placed under observation for dialysis treatment.  Once patient completes dialysis she should be ready for discharge.  No TOC needs identified.  Expected Discharge Plan: Home/Self Care Barriers to Discharge: Other (comment) (needs dialysis before discharge)   Patient Goals and CMS Choice Patient states their goals for this hospitalization and ongoing recovery are:: Daughter wants the patient to get dialysis and get back home      Expected Discharge Plan and Services Expected Discharge Plan: Home/Self Care       Living arrangements for the past 2 months: Single Family Home                                      Prior Living Arrangements/Services Living arrangements for the past 2 months: Single Family Home Lives with:: Adult Children Patient language and need for interpreter reviewed:: Yes Do you feel safe going back to the place where you live?: Yes      Need for Family Participation in Patient Care: Yes (Comment) (ESRD and Covid) Care giver support system in place?: Yes (comment) (daughter)   Criminal Activity/Legal Involvement Pertinent to Current Situation/Hospitalization: No - Comment as needed  Activities of Daily Living Home Assistive Devices/Equipment: Environmental consultant (specify type) ADL Screening (condition at time of admission) Patient's cognitive ability adequate to safely complete daily activities?: Yes Is the patient deaf or have difficulty hearing?: No Does the patient have difficulty seeing, even when wearing glasses/contacts?: No Does the patient have difficulty concentrating, remembering, or making decisions?:  No Patient able to express need for assistance with ADLs?: Yes Does the patient have difficulty dressing or bathing?: Yes Independently performs ADLs?: No Communication: Independent Dressing (OT): Needs assistance Is this a change from baseline?: Pre-admission baseline Grooming: Needs assistance Is this a change from baseline?: Pre-admission baseline Feeding: Independent Toileting: Needs assistance Is this a change from baseline?: Pre-admission baseline In/Out Bed: Needs assistance Is this a change from baseline?: Pre-admission baseline Walks in Home: Needs assistance Is this a change from baseline?: Pre-admission baseline Does the patient have difficulty walking or climbing stairs?: Yes Weakness of Legs: Both Weakness of Arms/Hands: None  Permission Sought/Granted Permission sought to share information with : Case Manager,Family Supports Permission granted to share information with : Yes, Verbal Permission Granted  Share Information with NAME: [name]     Permission granted to share info w Relationship: daughter     Emotional Assessment       Orientation: : Oriented to Self,Oriented to Place,Oriented to  Time,Oriented to Situation Alcohol / Substance Use: Not Applicable Psych Involvement: No (comment)  Admission diagnosis:  Hyperkalemia [E87.5] Elevated BUN [R79.9] ESRD (end stage renal disease) ([place]) [N18.6] ESRD on dialysis ([place]) [N18.6, Z99.2] Elevated brain natriuretic peptide (BNP) level [R79.89] High anion gap metabolic acidosis 99991111 COVID [U07.1] Patient Active Problem List   Diagnosis Date Noted   ESRD on dialysis ([place]) 07/31/2020   Hyperlipidemia associated with type 2 diabetes mellitus ([place]) 07/31/2020   PVD (peripheral vascular disease) ([place]) 07/31/2020  COVID-19 virus infection 07/31/2020   Cough 08/20/2019   Diabetic foot ulcer ([place]) 06/04/2019   Pain due to onychomycosis of toenails of both feet 03/05/2019   Atherosclerosis of native  arteries of the extremities with ulceration ([place]) 03/05/2019   Type 2 diabetes mellitus with vascular disease ([place]) 03/05/2019   Hyperkalemia [id number]   Complication from renal dialysis device 12/20/2018   Hyperlipidemia 05/03/2018   Chronic heart failure with preserved ejection fraction ([place]) 12/28/2017   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 ([place]) 08/25/2017   ESRD (end stage renal disease) ([place]) 08/25/2017   Paroxysmal atrial fibrillation ([place]) 05/21/2017   Hypertension associated with diabetes ([place]) 05/21/2017   Type 2 diabetes mellitus with chronic kidney disease on chronic dialysis, without long-term current use of insulin ([place]) 05/21/2017   Stage 5 chronic kidney disease on chronic dialysis ([place]) 05/21/2017   Acute hyperkalemia 04/27/2017   PCP:  Elby Beck, FNP (Inactive) Pharmacy:   [id number] [place] [address]Phone: [phone]: [phone]    Social Determinants of Health (SDOH) Interventions    Readmission Risk Interventions No flowsheet data found.

## 2020-08-01 NOTE — Hospital Course (Signed)
1/20 -> admitted for missing 2 dialysis session and being symptomatic 1/21 -> may not be able to get dialyzed in time today as per dialysis team

## 2020-08-01 NOTE — Progress Notes (Signed)
[place] at [place] NAME: Stacy Pham    MR#:  [id number]  DATE OF BIRTH:  09-08-1930  SUBJECTIVE:  CHIEF COMPLAINT:   Chief Complaint  Patient presents with   Dialysis  Wants to go home. Blood pressure running high. Some cough REVIEW OF SYSTEMS:  Review of Systems  Constitutional: Positive for malaise/fatigue. Negative for diaphoresis, fever and weight loss.  HENT: Negative for ear discharge, ear pain, hearing loss, nosebleeds, sore throat and tinnitus.   Eyes: Negative for blurred vision and pain.  Respiratory: Positive for cough. Negative for hemoptysis, shortness of breath and wheezing.   Cardiovascular: Negative for chest pain, palpitations, orthopnea and leg swelling.  Gastrointestinal: Negative for abdominal pain, blood in stool, constipation, diarrhea, heartburn, nausea and vomiting.  Genitourinary: Negative for dysuria, frequency and urgency.  Musculoskeletal: Negative for back pain and myalgias.  Skin: Negative for itching and rash.  Neurological: Negative for dizziness, tingling, tremors, focal weakness, seizures, weakness and headaches.  Psychiatric/Behavioral: Negative for depression. The patient is not nervous/anxious.    DRUG ALLERGIES:  No Known Allergies VITALS:  Blood pressure (!) 170/74, pulse (!) 57, temperature 98.8 F (37.1 C), temperature source Oral, resp. rate 16, height '5\' 3"'$  (1.6 m), weight 69.4 kg, SpO2 92 %. PHYSICAL EXAMINATION:  Physical Exam HENT:     Head: Normocephalic and atraumatic.  Eyes:     Extraocular Movements: EOM normal.     Conjunctiva/sclera: Conjunctivae normal.     Pupils: Pupils are equal, round, and reactive to light.  Neck:     Thyroid: No thyromegaly.     Trachea: No tracheal deviation.  Cardiovascular:     Rate and Rhythm: Normal rate and regular rhythm.     Heart sounds: Normal heart sounds.  Pulmonary:     Effort: Pulmonary effort is normal. No respiratory distress.     Breath  sounds: Normal breath sounds. No wheezing.  Chest:     Chest wall: No tenderness.  Abdominal:     General: Bowel sounds are normal. There is no distension.     Palpations: Abdomen is soft.     Tenderness: There is no abdominal tenderness.  Musculoskeletal:        General: Normal range of motion.     Cervical back: Normal range of motion and neck supple.     Right Lower Extremity: (Right great toe amputation) Feet:     Comments: Dry necrotic looking toes bilaterally Skin:    General: Skin is warm and dry.     Findings: No rash.     Comments: Right upper extremity AV fistula  Neurological:     Mental Status: She is alert and oriented to person, place, and time.     Cranial Nerves: No cranial nerve deficit.    LABORATORY PANEL:  Female CBC Recent Labs  Lab 08/01/20 0557  WBC 7.7  HGB 10.8*  HCT 34.5*  PLT 318   ------------------------------------------------------------------------------------------------------------------ Chemistries  Recent Labs  Lab 08/01/20 0557  NA 140  K 4.9  CL 100  CO2 22  GLUCOSE 93  BUN 68*  CREATININE 14.93*  CALCIUM 8.8*  AST 296*  ALT 188*  ALKPHOS 92  BILITOT 1.1   RADIOLOGY:  DG Chest 1 View  Result Date: 07/31/2020 CLINICAL DATA:  Hypoxia, history of recent COVID-19 positivity EXAM: CHEST  1 VIEW COMPARISON:  03/11/2020 FINDINGS: Cardiac shadow is enlarged but stable. Aortic calcifications are seen. Patchy airspace disease  is noted bilaterally consistent with the given clinical history. No sizable effusion is seen. No bony abnormality is noted. IMPRESSION: Patchy airspace opacity consistent with the given clinical history of COVID-19 positivity. Electronically Signed   By: Inez Catalina M.D.   On: 07/31/2020 19:32   ASSESSMENT AND PLAN:  87 year old female with a known history of ESRD on Tuesday Thursday Saturday hemodialysis, PAF on Eliquis and amiodarone, PVD on Plavix with chronic ulceration/ischemia of toes in bilateral feet,  diet-controlled diabetes, hypertension, hyperlipidemia is admitted for missing hemodialysis/2 sessions  ESRD TTS HD: Patient missed last 2 dialysis sessions with reported last dialysis completed 07/26/2020.  Potassium 4.9.  Currently no emergent need for HD. Nephrology team is aware and will try to dialyze her later tonight/ if not tomorrow  Positive COVID-19: SARS-CoV-2 PCR + 07/21/2020.  Has mild symptoms with cough and rhinorrhea.  Denies any shortness of breath.  SPO2 maintains around 98% on room air during my examination.  CXR with question of patchy opacities.  Overall stable without indication for treatment at this time.  Remain on airborne precautions for now.  Peripheral vascular disease with chronic ischemia/necrosis to toes of bilateral feet: Follows with vascular surgery, Dr. Delana Meyer.  Planned angiography earlier this month was canceled due to positive COVID-19 test. -Continue Plavix, statin, Lopressor  Paroxysmal atrial fibrillation: In sinus rhythm.  Continue Eliquis, amiodarone, and Lopressor.  Elevated troponin: Due to demand ischemia with missing 2 hemodialysis session Troponin mildly elevated at 95.  Patient denies any chest pain.  No acute ischemic changes on EKG.  Continue to monitor.  Uncontrolled hypertension: Continue amlodipine, Lopressor. Blood pressure should improve after dialysis. Will adjust medicine if needed  Diet controlled type 2 diabetes: Continue to monitor.  Hyperlipidemia: Continue atorvastatin.  Body mass index is 27.1 kg/m.  1/20 -> admitted for missing 2 dialysis session and being symptomatic 1/21 -> may not be able to get dialyzed in time today as per dialysis team       Level of care: Med-Surg  Status is: Observation  The patient remains OBS appropriate and will d/c before 2 midnights.  Dispo: The patient is from: Home              Anticipated d/c is to: Home              Anticipated d/c date is: 1 day              Patient  currently is not medically stable to d/c. Waiting for dialysis and improvement in her blood pressure  DVT prophylaxis:       apixaban (ELIQUIS) tablet 2.5 mg Start: 07/31/20 2200 apixaban (ELIQUIS) tablet 2.5 mg     Family Communication:  "discussed with patient")   All the records are reviewed and case discussed with Care Management/Social Worker. Management plans discussed with the patient, nursing, nephro and they are in agreement.  CODE STATUS: Full Code   TOTAL TIME TAKING CARE OF THIS PATIENT: 35 minutes.   More than 50% of the time was spent in counseling/coordination of care: YES  POSSIBLE D/C IN 1 DAYS, DEPENDING ON CLINICAL CONDITION.   Max Sane M.D on 08/01/2020 at 1:57 PM  [facility]   CC: Primary care physician; Elby Beck, FNP (Inactive)  Note: This dictation was prepared with Dragon dictation along with smaller phrase technology. Any transcriptional errors that result from this process are unintentional.

## 2020-08-02 DIAGNOSIS — R7989 Other specified abnormal findings of blood chemistry: Secondary | ICD-10-CM | POA: Diagnosis not present

## 2020-08-02 DIAGNOSIS — D631 Anemia in chronic kidney disease: Secondary | ICD-10-CM | POA: Diagnosis not present

## 2020-08-02 DIAGNOSIS — I120 Hypertensive chronic kidney disease with stage 5 chronic kidney disease or end stage renal disease: Secondary | ICD-10-CM | POA: Diagnosis not present

## 2020-08-02 DIAGNOSIS — U071 COVID-19: Secondary | ICD-10-CM | POA: Diagnosis not present

## 2020-08-02 DIAGNOSIS — N186 End stage renal disease: Secondary | ICD-10-CM | POA: Diagnosis not present

## 2020-08-02 DIAGNOSIS — N2581 Secondary hyperparathyroidism of renal origin: Secondary | ICD-10-CM | POA: Diagnosis not present

## 2020-08-02 DIAGNOSIS — E875 Hyperkalemia: Secondary | ICD-10-CM

## 2020-08-02 DIAGNOSIS — Z992 Dependence on renal dialysis: Secondary | ICD-10-CM | POA: Diagnosis not present

## 2020-08-02 LAB — HEPATITIS B SURFACE ANTIGEN: Hepatitis B Surface Ag: NONREACTIVE

## 2020-08-02 MED ORDER — HYDROCODONE-ACETAMINOPHEN 5-325 MG PO TABS
1.0000 | ORAL_TABLET | ORAL | Status: AC
  Administered 2020-08-02: 13:00:00 1 via ORAL
  Filled 2020-08-02: qty 1

## 2020-08-02 NOTE — Discharge Summary (Signed)
[place] at [place] NAME: Stacy Pham    MR#:  [id number]  DATE OF BIRTH:  March 22, 1931  DATE OF ADMISSION:  07/31/2020   ADMITTING PHYSICIAN: Lenore Cordia, MD  DATE OF DISCHARGE: 08/02/2020  2:34 PM  PRIMARY CARE PHYSICIAN: Elby Beck, FNP (Inactive)   ADMISSION DIAGNOSIS:  Hyperkalemia [E87.5] Elevated BUN [R79.9] ESRD (end stage renal disease) ([place]) [N18.6] ESRD on dialysis ([place]) [N18.6, Z99.2] Elevated brain natriuretic peptide (BNP) level [R79.89] High anion gap metabolic acidosis 99991111 COVID [U07.1] DISCHARGE DIAGNOSIS:  Principal Problem:   ESRD on dialysis [facility]) Active Problems:   Paroxysmal atrial fibrillation ([place])   Hypertension associated with diabetes ([place])   Type 2 diabetes mellitus with chronic kidney disease on chronic dialysis, without long-term current use of insulin (HCC)   Hyperlipidemia associated with type 2 diabetes mellitus (HCC)   PVD (peripheral vascular disease) (HCC)   COVID   Elevated brain natriuretic peptide (BNP) level  SECONDARY DIAGNOSIS:   Past Medical History:  Diagnosis Date  . (HFpEF) heart failure with preserved ejection fraction ([place])    a. 10/2017 Echo: EF 60-65%, Gr1 DD, mild MR, mildly dil LA/RA. PASP 56mHg.  . Arthritis   . Back pain   . Bronchitis   . Cataract   . Diabetes mellitus without complication (HCC)    no meds currently  . Dialysis patient ([place]   . Dysrhythmia   . ESRD (end stage renal disease) ([place]    a. 2018 - initially on HD but then transitioned to nightly PD in 08/2017.  .Marland KitchenGout   . History of methicillin resistant staphylococcus aureus (MRSA)   . Hypertension   . PAF (paroxysmal atrial fibrillation) (HCC)    a. CHA2DS2VASc = 5-->eliquis 2.5 BID.   HOSPITAL COURSE:  87year old female with a known history of ESRD on Tuesday Thursday Saturday hemodialysis, PAF on Eliquis and amiodarone, PVD on Plavix with chronic ulceration/ischemia of toes in bilateral feet,  diet-controlled diabetes, hypertension, hyperlipidemia is admitted for missing hemodialysis/2 sessions  ESRD TTS HD: Patient missed last 2 dialysis sessions with reported last dialysis completed 07/26/2020. Potassium 4.9. dialyzed on 1/21 and next HD planned for next Tuesday  Positive COVID-19: SARS-CoV-2 PCR + 07/21/2020. Has mild symptoms with cough and rhinorrhea. Denies any shortness of breath. SPO2 maintains around 98% on room air. CXR with question of patchy opacities. Overall stable without indication for treatment   Peripheral vascular diseasewith chronic ischemia/necrosis to toes of bilateral feet: Follows with vascular surgery, Dr. SDelana Meyer Planned angiography earlier this month was canceled due to positive COVID-19 test. -Continue Plavix, statin, Lopressor  Paroxysmal atrial fibrillation: In sinus rhythm. Continue Eliquis, amiodarone, and Lopressor.  Elevated troponin: Due to demand ischemia with missing 2 hemodialysis session Uncontrolled hypertension: Continue amlodipine, Lopressor. BP improved post dialysis   Diet controlled type 2 diabetes: Hyperlipidemia: Continue atorvastatin.    DISCHARGE CONDITIONS:  stable CONSULTS OBTAINED:   DRUG ALLERGIES:  No Known Allergies DISCHARGE MEDICATIONS:   Allergies as of 08/02/2020   No Known Allergies     Medication List    TAKE these medications   acetaminophen 650 MG CR tablet Commonly known as: TYLENOL Take 650-1,300 mg by mouth every 8 (eight) hours as needed for pain.   allopurinol 100 MG tablet Commonly known as: ZYLOPRIM TAKE 1 TABLET BY MOUTH EVERYDAY AT BEDTIME What changed: See the new instructions.   amiodarone 200 MG tablet Commonly known as: PACERONE Take 1 tablet (200 mg total) by mouth  every morning. Take 1 tablet (200 mg) by mouth once daily What changed: additional instructions   amLODipine 5 MG tablet Commonly known as: NORVASC TAKE 1 TABLET BY MOUTH EVERY DAY   atorvastatin  20 MG tablet Commonly known as: LIPITOR TAKE 1 TABLET BY MOUTH EVERY DAY IN THE EVENING What changed:   how much to take  how to take this   benzonatate 100 MG capsule Commonly known as: TESSALON Take 100 mg by mouth 3 (three) times daily as needed for cough.   clopidogrel 75 MG tablet Commonly known as: PLAVIX TAKE 1 TABLET BY MOUTH EVERY DAY   colchicine 0.6 MG tablet Take 0.6 mg by mouth daily as needed (gout).   Eliquis 2.5 MG Tabs tablet Generic drug: apixaban TAKE 1 TABLET BY MOUTH TWICE A DAY What changed: how much to take   ferric citrate 1 GM 210 MG(Fe) tablet Commonly known as: AURYXIA Take 210 mg by mouth 3 (three) times daily with meals.   HYDROcodone-acetaminophen 5-325 MG tablet Commonly known as: Norco Take 1 tablet by mouth every 6 (six) hours as needed for moderate pain or severe pain. What changed: how much to take   metoprolol tartrate 25 MG tablet Commonly known as: LOPRESSOR Take 0.5 tablets (12.5 mg total) by mouth 2 (two) times daily.      DISCHARGE INSTRUCTIONS:   DIET:  Renal diet DISCHARGE CONDITION:  Stable ACTIVITY:  Activity as tolerated OXYGEN:  Home Oxygen: No.  Oxygen Delivery: room air DISCHARGE LOCATION:  home   If you experience worsening of your admission symptoms, develop shortness of breath, life threatening emergency, suicidal or homicidal thoughts you must seek medical attention immediately by calling 911 or calling your MD immediately  if symptoms less severe.  You Must read complete instructions/literature along with all the possible adverse reactions/side effects for all the Medicines you take and that have been prescribed to you. Take any new Medicines after you have completely understood and accpet all the possible adverse reactions/side effects.   Please note  You were cared for by a hospitalist during your hospital stay. If you have any questions about your discharge medications or the care you received while  you were in the hospital after you are discharged, you can call the unit and asked to speak with the hospitalist on call if the hospitalist that took care of you is not available. Once you are discharged, your primary care physician will handle any further medical issues. Please note that NO REFILLS for any discharge medications will be authorized once you are discharged, as it is imperative that you return to your primary care physician (or establish a relationship with a primary care physician if you do not have one) for your aftercare needs so that they can reassess your need for medications and monitor your lab values.    On the day of Discharge:  VITAL SIGNS:  Blood pressure (!) 146/62, pulse 69, temperature 98.3 F (36.8 C), resp. rate 15, height '5\' 3"'$  (1.6 m), weight 69.4 kg, SpO2 99 %. PHYSICAL EXAMINATION:  GENERAL:  87 y.o.-year-old patient lying in the bed with no acute distress.  EYES: Pupils equal, round, reactive to light and accommodation. No scleral icterus. Extraocular muscles intact.  HEENT: Head atraumatic, normocephalic. Oropharynx and nasopharynx clear.  NECK:  Supple, no jugular venous distention. No thyroid enlargement, no tenderness.  LUNGS: Normal breath sounds bilaterally, no wheezing, rales,rhonchi or crepitation. No use of accessory muscles of respiration.  CARDIOVASCULAR: S1, S2 normal.  No murmurs, rubs, or gallops.  ABDOMEN: Soft, non-tender, non-distended. Bowel sounds present. No organomegaly or mass.  EXTREMITIES: No pedal edema, cyanosis, or clubbing.  NEUROLOGIC: Cranial nerves II through XII are intact. Muscle strength 5/5 in all extremities. Sensation intact. Gait not checked.  PSYCHIATRIC: The patient is alert and oriented x 3.  SKIN: No obvious rash, lesion, or ulcer.  DATA REVIEW:   CBC Recent Labs  Lab 08/01/20 0557  WBC 7.7  HGB 10.8*  HCT 34.5*  PLT 318    Chemistries  Recent Labs  Lab 08/01/20 0557  NA 140  K 4.9  CL 100  CO2 22   GLUCOSE 93  BUN 68*  CREATININE 14.93*  CALCIUM 8.8*  AST 296*  ALT 188*  ALKPHOS 73  BILITOT 1.1     Outpatient follow-up  Follow-up Information    Elby Beck, FNP. Schedule an appointment as soon as possible for a visit in 1 week(s).   Specialties: Nurse Practitioner, Family Medicine Contact information: [place] [phone]        Nelva Bush, MD. Schedule an appointment as soon as possible for a visit in 2 week(s).   Specialty: Cardiology Contact information: [place]  [phone]                  Management plans discussed with the patient, family and they are in agreement.  CODE STATUS: Full Code   TOTAL TIME TAKING CARE OF THIS PATIENT: 45 minutes.    Max Sane M.D on 08/02/2020 at 6:33 PM  [facility]   CC: Primary care physician; Elby Beck, FNP (Inactive)   Note: This dictation was prepared with Dragon dictation along with smaller phrase technology. Any transcriptional errors that result from this process are unintentional.

## 2020-08-02 NOTE — Progress Notes (Signed)
[facility]  ROUNDING NOTE   Subjective:   Patient states she is feeling better. She wants to go home.  Hemodialysis treatment yesterday. Tolerated treatment well. UF of 1.5liter.   Objective:  Vital signs in last 24 hours:  Temp:  [98.3 F (36.8 C)-98.9 F (37.2 C)] 98.3 F (36.8 C) (01/22 1302) Pulse Rate:  [56-86] 69 (01/22 1302) Resp:  [15-31] 15 (01/22 0933) BP: (111-186)/(42-62) 146/62 (01/22 1302) SpO2:  [92 %-99 %] 99 % (01/22 1302)  Weight change:  Filed Weights   07/31/20 1422 08/01/20 0228  Weight: 68 kg 69.4 kg    Intake/Output: I/O last 3 completed shifts: In: -  Out: 1500 [Other:1500]   Intake/Output this shift:  No intake/output data recorded.  Physical Exam: General:  No acute distress, laying in bed  Head:  Normocephalic, atraumatic. Moist oral mucosal membranes  Eyes:  Anicteric  Neck:  Supple  Lungs:   Normal effort  Heart:  Irregular  Abdomen:   Soft, nontender, bowel sounds present  Extremities:  1+ peripheral edema.  Neurologic:  Awake, alert, following commands  Skin:  No lesions  Access:  Right upper extremity AV graft    Basic Metabolic Panel: Recent Labs  Lab 07/31/20 1430 08/01/20 0557  NA 138 140  K 6.0* 4.9  CL 96* 100  CO2 22 22  GLUCOSE 71 93  BUN 61* 68*  CREATININE 13.82* 14.93*  CALCIUM 8.6* 8.8*    Liver Function Tests: Recent Labs  Lab 07/31/20 1430 08/01/20 0557  AST 266* 296*  ALT 178* 188*  ALKPHOS 90 92  BILITOT 1.6* 1.1  PROT 7.7 7.0  ALBUMIN 3.1* 2.8*   No results for input(s): LIPASE, AMYLASE in the last 168 hours. No results for input(s): AMMONIA in the last 168 hours.  CBC: Recent Labs  Lab 07/31/20 1430 08/01/20 0557  WBC 6.7 7.7  NEUTROABS  --  5.0  HGB 11.1* 10.8*  HCT 37.1 34.5*  MCV 101.4* 97.2  PLT 268 318    Cardiac Enzymes: No results for input(s): CKTOTAL, CKMB, CKMBINDEX, TROPONINI in the last 168 hours.  BNP: Invalid input(s): POCBNP  CBG: No results  for input(s): GLUCAP in the last 168 hours.  Microbiology: Results for orders placed or performed during the hospital encounter of 07/31/20  MRSA PCR Screening     Status: None   Collection Time: 08/01/20  2:34 AM   Specimen: Nasopharyngeal  Result Value Ref Range Status   MRSA by PCR NEGATIVE NEGATIVE Final    Comment:        The GeneXpert MRSA Assay (FDA approved for NASAL specimens only), is one component of a comprehensive MRSA colonization surveillance program. It is not intended to diagnose MRSA infection nor to guide or monitor treatment for MRSA infections. Performed at [facility], [address] [place], [place] [address]     Coagulation Studies: No results for input(s): LABPROT, INR in the last 72 hours.  Urinalysis: No results for input(s): COLORURINE, LABSPEC, PHURINE, GLUCOSEU, HGBUR, BILIRUBINUR, KETONESUR, PROTEINUR, UROBILINOGEN, NITRITE, LEUKOCYTESUR in the last 72 hours.  Invalid input(s): APPERANCEUR    Imaging: DG Chest 1 View  Result Date: 07/31/2020 CLINICAL DATA:  Hypoxia, history of recent COVID-19 positivity EXAM: CHEST  1 VIEW COMPARISON:  03/11/2020 FINDINGS: Cardiac shadow is enlarged but stable. Aortic calcifications are seen. Patchy airspace disease is noted bilaterally consistent with the given clinical history. No sizable effusion is seen. No bony abnormality is noted. IMPRESSION: Patchy airspace opacity consistent  with the given clinical history of COVID-19 positivity. Electronically Signed   By: Inez Catalina M.D.   On: 07/31/2020 19:32     Medications:    . amiodarone  200 mg Oral [id number]  . amLODipine  5 mg Oral Daily  . apixaban  2.5 mg Oral BID  . vitamin C  500 mg Oral Daily  . atorvastatin  20 mg Oral QPM  . Chlorhexidine Gluconate Cloth  6 each Topical Daily  . clopidogrel  75 mg Oral Daily  . mouth rinse  15 mL Mouth Rinse BID  . metoprolol tartrate  12.5 mg Oral BID  . zinc sulfate  220 mg Oral Daily    acetaminophen, albuterol, guaiFENesin-dextromethorphan, ondansetron **OR** ondansetron (ZOFRAN) IV  Assessment/ Plan:   [name] is a 87 y.o. black ([place]) female  with ESRD on HD, paroxysmal atrial fibrillation, peripheral vascular disease, diabetes mellitus type 2, pretension, hyperlipidemia, anemia of chronic kidney disease, secondary hyperparathyroidism who was admitted for missed dialysis treatment.  1.  ESRD on HD TTS at [facility].  Patient missed dialysis on Tuesday and Thursday.  Last treatment was yesterday. Plan on next treatment for Tuesday.   2.  Anemia of chronic kidney disease.  Hemoglobin 10.8.  EPO as outpatient.   3.  Secondary hyperparathyroidism.  Not currently on binders therapy.    4.  COVID-19 pneumonia.  Supportive care.   5. Hypertension:  - metoprolol and amlodipine.    LOS: 0 Stacy Pham 1/22/20222:55 PM

## 2020-08-04 ENCOUNTER — Telehealth (INDEPENDENT_AMBULATORY_CARE_PROVIDER_SITE_OTHER): Payer: Self-pay

## 2020-08-04 ENCOUNTER — Other Ambulatory Visit (INDEPENDENT_AMBULATORY_CARE_PROVIDER_SITE_OTHER): Payer: Self-pay | Admitting: Nurse Practitioner

## 2020-08-04 DIAGNOSIS — I7025 Atherosclerosis of native arteries of other extremities with ulceration: Secondary | ICD-10-CM

## 2020-08-04 MED ORDER — HYDROCODONE-ACETAMINOPHEN 5-325 MG PO TABS: 1.0000 | ORAL_TABLET | Freq: Four times a day (QID) | ORAL | 0 refills | Status: AC | PRN

## 2020-08-04 NOTE — Telephone Encounter (Signed)
Spoke with the patient's daughter to get her scheduled for a procedure and she stated that the patient was in excruciating  patient and wanted something for the pain. The daughter also stated she was hospitalized for not being able to walk. She was admitted on 07/31/20. Patient is scheduled for a leg angio on 08/19/20.

## 2020-08-04 NOTE — Telephone Encounter (Signed)
sent 

## 2020-08-04 NOTE — Telephone Encounter (Signed)
Patient's daughter was called and a message was left letting her know that a prescription for pain medication had been called into the pharmacy.

## 2020-08-04 NOTE — Telephone Encounter (Signed)
Patient's daughter returned my call and the patient has been rescheduled for a right leg angio with Dr. Delana Meyer on 08/19/20 with a 6:45 am arrival time to the [facility]. Covid testing is not needed as the patient was covid + on 07/18/20. Pre-procedure instructions will be mailed to the patient.

## 2020-08-04 NOTE — Telephone Encounter (Signed)
I attempted to contact the patient's daughter to get the patient rescheduled for a right leg angio. A message was left for a return call.

## 2020-08-05 LAB — HEPATITIS B SURFACE ANTIBODY, QUANTITATIVE: Hep B S AB Quant (Post): 62.2 m[IU]/mL (ref >9.9)

## 2020-08-06 ENCOUNTER — Telehealth: Payer: Self-pay | Admitting: Family Medicine

## 2020-08-06 NOTE — Telephone Encounter (Signed)
Called and spoke with patients daughter who stated that her mother still has a cough and she has developed weakness in both lower extremities. Patient is still able to ambulate, but it has become increasingly difficult. Patients daughter stated that she was resting at the moment. Instructed patients daughter to call office if there is anything we can assist her with. Also, informed patients daughter that one of our schedulers will be in touch to get her set up with a new provider. Daughter verbalized understanding.

## 2020-08-06 NOTE — Telephone Encounter (Signed)
Pt recently admitted to the hospital.   Please reach out to patient and make sure she has a TOC appt with new provider.   Depending on how she is doing she may need an acute hospital f/u

## 2020-08-07 NOTE — Telephone Encounter (Signed)
Left message to call back. Attempting to schedule transfer of care appointment for patient.

## 2020-08-11 ENCOUNTER — Emergency Department: Payer: Medicare Other

## 2020-08-11 ENCOUNTER — Inpatient Hospital Stay
Admission: EM | Admit: 2020-08-11 | Discharge: 2020-09-09 | DRG: 208 | Disposition: E | Payer: Medicare Other | Attending: Internal Medicine | Admitting: Internal Medicine

## 2020-08-11 DIAGNOSIS — I5023 Acute on chronic systolic (congestive) heart failure: Secondary | ICD-10-CM | POA: Diagnosis present

## 2020-08-11 DIAGNOSIS — Z992 Dependence on renal dialysis: Secondary | ICD-10-CM | POA: Diagnosis not present

## 2020-08-11 DIAGNOSIS — G939 Disorder of brain, unspecified: Secondary | ICD-10-CM

## 2020-08-11 DIAGNOSIS — Z7902 Long term (current) use of antithrombotics/antiplatelets: Secondary | ICD-10-CM

## 2020-08-11 DIAGNOSIS — Z9115 Patient's noncompliance with renal dialysis: Secondary | ICD-10-CM | POA: Diagnosis not present

## 2020-08-11 DIAGNOSIS — E8770 Fluid overload, unspecified: Secondary | ICD-10-CM | POA: Diagnosis present

## 2020-08-11 DIAGNOSIS — L89156 Pressure-induced deep tissue damage of sacral region: Secondary | ICD-10-CM | POA: Diagnosis present

## 2020-08-11 DIAGNOSIS — G928 Other toxic encephalopathy: Secondary | ICD-10-CM | POA: Diagnosis present

## 2020-08-11 DIAGNOSIS — I462 Cardiac arrest due to underlying cardiac condition: Secondary | ICD-10-CM | POA: Diagnosis present

## 2020-08-11 DIAGNOSIS — N186 End stage renal disease: Secondary | ICD-10-CM | POA: Diagnosis present

## 2020-08-11 DIAGNOSIS — I4891 Unspecified atrial fibrillation: Secondary | ICD-10-CM | POA: Diagnosis not present

## 2020-08-11 DIAGNOSIS — R000 Tachycardia, unspecified: Secondary | ICD-10-CM | POA: Diagnosis not present

## 2020-08-11 DIAGNOSIS — Z8614 Personal history of Methicillin resistant Staphylococcus aureus infection: Secondary | ICD-10-CM

## 2020-08-11 DIAGNOSIS — U071 COVID-19: Secondary | ICD-10-CM | POA: Diagnosis present

## 2020-08-11 DIAGNOSIS — E875 Hyperkalemia: Secondary | ICD-10-CM | POA: Diagnosis not present

## 2020-08-11 DIAGNOSIS — I472 Ventricular tachycardia: Secondary | ICD-10-CM | POA: Diagnosis present

## 2020-08-11 DIAGNOSIS — D631 Anemia in chronic kidney disease: Secondary | ICD-10-CM | POA: Diagnosis present

## 2020-08-11 DIAGNOSIS — E43 Unspecified severe protein-calorie malnutrition: Secondary | ICD-10-CM | POA: Diagnosis present

## 2020-08-11 DIAGNOSIS — N185 Chronic kidney disease, stage 5: Secondary | ICD-10-CM | POA: Diagnosis not present

## 2020-08-11 DIAGNOSIS — I469 Cardiac arrest, cause unspecified: Secondary | ICD-10-CM | POA: Diagnosis not present

## 2020-08-11 DIAGNOSIS — N2581 Secondary hyperparathyroidism of renal origin: Secondary | ICD-10-CM | POA: Diagnosis present

## 2020-08-11 DIAGNOSIS — J189 Pneumonia, unspecified organism: Secondary | ICD-10-CM | POA: Diagnosis not present

## 2020-08-11 DIAGNOSIS — E1122 Type 2 diabetes mellitus with diabetic chronic kidney disease: Secondary | ICD-10-CM | POA: Diagnosis present

## 2020-08-11 DIAGNOSIS — Z6825 Body mass index (BMI) 25.0-25.9, adult: Secondary | ICD-10-CM | POA: Diagnosis not present

## 2020-08-11 DIAGNOSIS — Z4682 Encounter for fitting and adjustment of non-vascular catheter: Secondary | ICD-10-CM | POA: Diagnosis not present

## 2020-08-11 DIAGNOSIS — E872 Acidosis, unspecified: Secondary | ICD-10-CM

## 2020-08-11 DIAGNOSIS — I132 Hypertensive heart and chronic kidney disease with heart failure and with stage 5 chronic kidney disease, or end stage renal disease: Secondary | ICD-10-CM | POA: Diagnosis present

## 2020-08-11 DIAGNOSIS — Z515 Encounter for palliative care: Secondary | ICD-10-CM | POA: Diagnosis not present

## 2020-08-11 DIAGNOSIS — J9600 Acute respiratory failure, unspecified whether with hypoxia or hypercapnia: Secondary | ICD-10-CM | POA: Diagnosis not present

## 2020-08-11 DIAGNOSIS — Z66 Do not resuscitate: Secondary | ICD-10-CM | POA: Diagnosis not present

## 2020-08-11 DIAGNOSIS — I480 Paroxysmal atrial fibrillation: Secondary | ICD-10-CM | POA: Diagnosis not present

## 2020-08-11 DIAGNOSIS — I214 Non-ST elevation (NSTEMI) myocardial infarction: Secondary | ICD-10-CM | POA: Diagnosis not present

## 2020-08-11 DIAGNOSIS — Z7901 Long term (current) use of anticoagulants: Secondary | ICD-10-CM

## 2020-08-11 DIAGNOSIS — E870 Hyperosmolality and hypernatremia: Secondary | ICD-10-CM | POA: Diagnosis present

## 2020-08-11 DIAGNOSIS — J9601 Acute respiratory failure with hypoxia: Secondary | ICD-10-CM | POA: Diagnosis present

## 2020-08-11 DIAGNOSIS — J9602 Acute respiratory failure with hypercapnia: Secondary | ICD-10-CM | POA: Diagnosis present

## 2020-08-11 DIAGNOSIS — G931 Anoxic brain damage, not elsewhere classified: Secondary | ICD-10-CM | POA: Diagnosis present

## 2020-08-11 DIAGNOSIS — A419 Sepsis, unspecified organism: Secondary | ICD-10-CM

## 2020-08-11 DIAGNOSIS — I255 Ischemic cardiomyopathy: Secondary | ICD-10-CM | POA: Diagnosis present

## 2020-08-11 DIAGNOSIS — M109 Gout, unspecified: Secondary | ICD-10-CM | POA: Diagnosis present

## 2020-08-11 DIAGNOSIS — E785 Hyperlipidemia, unspecified: Secondary | ICD-10-CM | POA: Diagnosis present

## 2020-08-11 DIAGNOSIS — R4182 Altered mental status, unspecified: Secondary | ICD-10-CM | POA: Diagnosis not present

## 2020-08-11 DIAGNOSIS — Z8249 Family history of ischemic heart disease and other diseases of the circulatory system: Secondary | ICD-10-CM

## 2020-08-11 DIAGNOSIS — J1282 Pneumonia due to coronavirus disease 2019: Secondary | ICD-10-CM | POA: Diagnosis present

## 2020-08-11 DIAGNOSIS — Z01818 Encounter for other preprocedural examination: Secondary | ICD-10-CM

## 2020-08-11 DIAGNOSIS — R7989 Other specified abnormal findings of blood chemistry: Secondary | ICD-10-CM

## 2020-08-11 DIAGNOSIS — Z4659 Encounter for fitting and adjustment of other gastrointestinal appliance and device: Secondary | ICD-10-CM

## 2020-08-11 DIAGNOSIS — Z79899 Other long term (current) drug therapy: Secondary | ICD-10-CM

## 2020-08-11 LAB — CBG MONITORING, ED
Glucose-Capillary: 92 mg/dL (ref 70–99)
Glucose-Capillary: 115 mg/dL — ABNORMAL HIGH (ref 70–99)

## 2020-08-11 LAB — BLOOD GAS, VENOUS
Acid-base deficit: 3.9 mmol/L — ABNORMAL HIGH (ref 0.0–2.0)
Bicarbonate: 22.2 mmol/L (ref 20.0–28.0)
FIO2: 1
O2 Saturation: 88.9 %
Patient temperature: 37
pCO2, Ven: 44 mmHg (ref 44.0–60.0)
pH, Ven: 7.31 (ref 7.250–7.430)
pO2, Ven: 62 mmHg — ABNORMAL HIGH (ref 32.0–45.0)

## 2020-08-11 LAB — CBC WITH DIFFERENTIAL/PLATELET
Abs Immature Granulocytes: 1.3 10*3/uL — ABNORMAL HIGH (ref 0.00–0.07)
Basophils Absolute: 0.1 10*3/uL (ref 0.0–0.1)
Basophils Relative: 1 %
Eosinophils Absolute: 0 10*3/uL (ref 0.0–0.5)
Eosinophils Relative: 0 %
HCT: 30.2 % — ABNORMAL LOW (ref 36.0–46.0)
Hemoglobin: 9.4 g/dL — ABNORMAL LOW (ref 12.0–15.0)
Immature Granulocytes: 9 %
Lymphocytes Relative: 12 %
Lymphs Abs: 1.8 10*3/uL (ref 0.7–4.0)
MCH: 29.8 pg (ref 26.0–34.0)
MCHC: 31.1 g/dL (ref 30.0–36.0)
MCV: 95.9 fL (ref 80.0–100.0)
Monocytes Absolute: 0.3 10*3/uL (ref 0.1–1.0)
Monocytes Relative: 2 %
Neutro Abs: 11.7 10*3/uL — ABNORMAL HIGH (ref 1.7–7.7)
Neutrophils Relative %: 76 %
Platelets: 580 10*3/uL — ABNORMAL HIGH (ref 150–400)
RBC: 3.15 MIL/uL — ABNORMAL LOW (ref 3.87–5.11)
RDW: 16.6 % — ABNORMAL HIGH (ref 11.5–15.5)
Smear Review: NORMAL
WBC: 15 10*3/uL — ABNORMAL HIGH (ref 4.0–10.5)
nRBC: 0.1 % (ref 0.0–0.2)

## 2020-08-11 LAB — PHOSPHORUS: Phosphorus: 10.4 mg/dL — ABNORMAL HIGH (ref 2.5–4.6)

## 2020-08-11 LAB — PROTIME-INR
INR: 2.3 — ABNORMAL HIGH (ref 0.8–1.2)
Prothrombin Time: 24.7 seconds — ABNORMAL HIGH (ref 11.4–15.2)

## 2020-08-11 LAB — TROPONIN I (HIGH SENSITIVITY): Troponin I (High Sensitivity): 464 ng/L (ref <18)

## 2020-08-11 LAB — LACTIC ACID, PLASMA
Lactic Acid, Venous: >11 mmol/L (ref 0.5–1.9)
Lactic Acid, Venous: >11 mmol/L (ref 0.5–1.9)

## 2020-08-11 LAB — APTT: aPTT: 35 seconds (ref 24–36)

## 2020-08-11 MED ORDER — ALBUTEROL SULFATE (2.5 MG/3ML) 0.083% IN NEBU
10.0000 mg | INHALATION_SOLUTION | Freq: Once | RESPIRATORY_TRACT | Status: AC
  Administered 2020-08-11: 10 mg via RESPIRATORY_TRACT
  Filled 2020-08-11: qty 12

## 2020-08-11 MED ORDER — AMIODARONE HCL 150 MG/3ML IV SOLN
INTRAVENOUS | Status: AC | PRN
  Administered 2020-08-11: 300 mg via INTRAVENOUS

## 2020-08-11 MED ORDER — PIPERACILLIN-TAZOBACTAM IN DEX 2-0.25 GM/50ML IV SOLN
2.2500 g | Freq: Once | INTRAVENOUS | Status: AC
  Administered 2020-08-11: 2.25 g via INTRAVENOUS
  Filled 2020-08-11: qty 50

## 2020-08-11 MED ORDER — FAMOTIDINE IN NACL 20-0.9 MG/50ML-% IV SOLN
20.0000 mg | INTRAVENOUS | Status: DC
  Administered 2020-08-12: 20 mg via INTRAVENOUS
  Filled 2020-08-11: qty 50

## 2020-08-11 MED ORDER — DEXTROSE 50 % IV SOLN
INTRAVENOUS | Status: AC | PRN
  Administered 2020-08-11: 1 via INTRAVENOUS

## 2020-08-11 MED ORDER — SODIUM CHLORIDE 0.9 % IV SOLN
0.0000 ug/min | INTRAVENOUS | Status: DC
  Administered 2020-08-12: 400 ug/min via INTRAVENOUS
  Administered 2020-08-12: 20 ug/min via INTRAVENOUS
  Filled 2020-08-11 (×2): qty 10

## 2020-08-11 MED ORDER — SODIUM CHLORIDE 0.9% FLUSH
3.0000 mL | Freq: Two times a day (BID) | INTRAVENOUS | Status: DC
  Administered 2020-08-12: 3 mL via INTRAVENOUS

## 2020-08-11 MED ORDER — FENTANYL CITRATE (PF) 100 MCG/2ML IJ SOLN: 25.0000 ug | INTRAMUSCULAR | Status: DC | PRN

## 2020-08-11 MED ORDER — NOREPINEPHRINE 16 MG/250ML-% IV SOLN
0.0000 ug/min | INTRAVENOUS | Status: DC
  Administered 2020-08-11: 40 ug/min via INTRAVENOUS
  Filled 2020-08-11 (×3): qty 250

## 2020-08-11 MED ORDER — SODIUM CHLORIDE 0.9% FLUSH: 3.0000 mL | INTRAVENOUS | Status: DC | PRN

## 2020-08-11 MED ORDER — DOCUSATE SODIUM 100 MG PO CAPS: 100.0000 mg | ORAL_CAPSULE | Freq: Two times a day (BID) | ORAL | Status: DC | PRN

## 2020-08-11 MED ORDER — MIDAZOLAM HCL 2 MG/2ML IJ SOLN: 1.0000 mg | INTRAMUSCULAR | Status: DC | PRN

## 2020-08-11 MED ORDER — SODIUM ZIRCONIUM CYCLOSILICATE 10 G PO PACK
10.0000 g | PACK | Freq: Once | ORAL | Status: AC
  Administered 2020-08-11: 10 g
  Filled 2020-08-11: qty 1

## 2020-08-11 MED ORDER — HEPARIN SODIUM (PORCINE) 5000 UNIT/ML IJ SOLN: 5000.0000 [IU] | Freq: Three times a day (TID) | INTRAMUSCULAR | Status: DC

## 2020-08-11 MED ORDER — CHLORHEXIDINE GLUCONATE CLOTH 2 % EX PADS
6.0000 | MEDICATED_PAD | Freq: Every day | CUTANEOUS | Status: DC
  Filled 2020-08-11: qty 6

## 2020-08-11 MED ORDER — INSULIN ASPART 100 UNIT/ML ~~LOC~~ SOLN
5.0000 [IU] | Freq: Once | SUBCUTANEOUS | Status: AC
  Administered 2020-08-11: 5 [IU] via INTRAVENOUS

## 2020-08-11 MED ORDER — SODIUM BICARBONATE 8.4 % IV SOLN
INTRAVENOUS | Status: DC
  Filled 2020-08-11 (×2): qty 850

## 2020-08-11 MED ORDER — CALCIUM CHLORIDE 10 % IV SOLN
INTRAVENOUS | Status: AC | PRN
  Administered 2020-08-11 (×7): 1 g via INTRAVENOUS

## 2020-08-11 MED ORDER — SODIUM BICARBONATE 8.4 % IV SOLN
Freq: Once | INTRAVENOUS | Status: DC
  Filled 2020-08-11 (×2): qty 100

## 2020-08-11 MED ORDER — ONDANSETRON HCL 4 MG/2ML IJ SOLN: 4.0000 mg | Freq: Four times a day (QID) | INTRAMUSCULAR | Status: DC | PRN

## 2020-08-11 MED ORDER — ACETAMINOPHEN 325 MG PO TABS: 650.0000 mg | ORAL_TABLET | ORAL | Status: DC | PRN

## 2020-08-11 MED ORDER — VASOPRESSIN 20 UNITS/100 ML INFUSION FOR SHOCK
0.0000 [IU]/min | INTRAVENOUS | Status: DC
  Administered 2020-08-11 – 2020-08-12 (×2): 0.03 [IU]/min via INTRAVENOUS
  Filled 2020-08-11 (×2): qty 100

## 2020-08-11 MED ORDER — POLYETHYLENE GLYCOL 3350 17 G PO PACK: 17.0000 g | PACK | Freq: Every day | ORAL | Status: DC | PRN

## 2020-08-11 MED ORDER — LORAZEPAM 2 MG/ML IJ SOLN
1.0000 mg | Freq: Once | INTRAMUSCULAR | Status: AC
  Administered 2020-08-11: 1 mg via INTRAVENOUS

## 2020-08-11 MED ORDER — EPINEPHRINE 1 MG/10ML IJ SOSY
PREFILLED_SYRINGE | INTRAMUSCULAR | Status: AC | PRN
  Administered 2020-08-11 (×6): 1 mg via INTRAVENOUS

## 2020-08-11 MED ORDER — ATROPINE SULFATE 1 MG/ML IJ SOLN
INTRAMUSCULAR | Status: AC | PRN
  Administered 2020-08-11: 1 mg via INTRAVENOUS

## 2020-08-11 MED ORDER — SODIUM CHLORIDE 0.9 % IV SOLN: 250.0000 mL | INTRAVENOUS | Status: DC | PRN

## 2020-08-11 MED ORDER — NOREPINEPHRINE 4 MG/250ML-% IV SOLN
INTRAVENOUS | Status: AC
  Administered 2020-08-11: 5 ug/min via INTRAVENOUS
  Filled 2020-08-11: qty 250

## 2020-08-11 MED ORDER — SODIUM BICARBONATE 8.4 % IV SOLN
INTRAVENOUS | Status: AC | PRN
  Administered 2020-08-11 (×4): 50 meq via INTRAVENOUS

## 2020-08-11 MED ORDER — HYDROCORTISONE NA SUCCINATE PF 100 MG IJ SOLR
100.0000 mg | Freq: Once | INTRAMUSCULAR | Status: AC
  Administered 2020-08-11: 100 mg via INTRAVENOUS
  Filled 2020-08-11: qty 2

## 2020-08-11 MED ORDER — POLYETHYLENE GLYCOL 3350 17 G PO PACK
17.0000 g | PACK | Freq: Every day | ORAL | Status: DC
  Administered 2020-08-12: 17 g
  Filled 2020-08-11: qty 1

## 2020-08-11 MED ORDER — EPINEPHRINE 1 MG/10ML IJ SOSY
PREFILLED_SYRINGE | INTRAMUSCULAR | Status: AC
  Filled 2020-08-11: qty 30

## 2020-08-11 MED ORDER — DOCUSATE SODIUM 50 MG/5ML PO LIQD
100.0000 mg | Freq: Two times a day (BID) | ORAL | Status: DC
  Administered 2020-08-12: 100 mg
  Filled 2020-08-11 (×2): qty 10

## 2020-08-11 MED ORDER — NOREPINEPHRINE 4 MG/250ML-% IV SOLN: 0.0000 ug/min | INTRAVENOUS | Status: DC

## 2020-08-11 MED ORDER — PROPOFOL 1000 MG/100ML IV EMUL
0.0000 ug/kg/min | INTRAVENOUS | Status: DC
  Administered 2020-08-12: 5 ug/kg/min via INTRAVENOUS
  Filled 2020-08-11: qty 100

## 2020-08-11 MED ORDER — SODIUM BICARBONATE 8.4 % IV SOLN
INTRAVENOUS | Status: DC
  Filled 2020-08-11 (×4): qty 850

## 2020-08-11 NOTE — Progress Notes (Signed)
GOALS OF CARE DISCUSSION  The Clinical status was relayed to family in detail. Daughter and granddaughter at bedside  Updated and notified of patients medical condition.  Patient remains unresponsive and will not open eyes to command.    Patient is having a weak cough and struggling to remove secretions.   Patient with increased WOB and using accessory muscles to breathe Explained to family course of therapy and the modalities     Patient with Progressive multiorgan failure with a very high probablity of a very minimal chance of meaningful recovery despite all aggressive and optimal medical therapy. Patient is in the Dying  Process associated with Suffering.  Family understands the situation.  They will discuss with rest of family and assess CODE status Remains full code at this time  Family are satisfied with Plan of action and management. All questions answered  Additional CC time 32 mins   Gracen Ringwald Patricia Pesa, M.D.  [facility]  Medical Director [place] Director [facility] Cardio-Pulmonary Department

## 2020-08-11 NOTE — ED Provider Notes (Addendum)
[facility] Emergency Department Provider Note  ____________________________________________   Event Date/Time   First MD Initiated Contact with Patient 07/20/2020 1946     (approximate)  I have reviewed the triage vital signs and the nursing notes.   HISTORY  Chief Complaint No chief complaint on file.   HPI Stacy Pham is a 87 y.o. female with a past medical history of CHF, DM, ESRD, paroxysmal A. fib, HTN, and gout who presents accompanied by family for unknown mental status. On my initial evaluation patient was being rolled back and having agonal respirations and unable provide any additional history. Per nursing staff was able to speak with daughter patient recently been hospitalized for issues related to her dialysis. However it is unclear if she has missed any recent sessions. No other history is immediate available presentation.         Past Medical History:  Diagnosis Date  . (HFpEF) heart failure with preserved ejection fraction ([place])    a. 10/2017 Echo: EF 60-65%, Gr1 DD, mild MR, mildly dil LA/RA. PASP 70mHg.  . Arthritis   . Back pain   . Bronchitis   . Cataract   . Diabetes mellitus without complication (HCC)    no meds currently  . Dialysis patient (HC[place]   . Dysrhythmia   . ESRD (end stage renal disease) (HMount Ple[place]    a. 2018 - initially on HD but then transitioned to nightly PD in 08/2017.  .Marland KitchenGout   . History of methicillin resistant staphylococcus aureus (MRSA)   . Hypertension   . PAF (paroxysmal atrial fibrillation) (HCC)    a. CHA2DS2VASc = 5-->eliquis 2.5 BID.    Patient Active Problem List   Diagnosis Date Noted  . Cardiac arrest ([place] 07/20/2020  . Elevated brain natriuretic peptide (BNP) level   . ESRD on dialysis (HB[place] 07/31/2020  . Hyperlipidemia associated with type 2 diabetes mellitus (HDarrouz[place] 07/31/2020  . PVD (peripheral vascular disease) (HR[place]sel[place] 07/31/2020  . COVID 07/31/2020  . Cough 08/20/2019  . Diabetic foot  ulcer (HB[place] 06/04/2019  . Pain due to onychomycosis of toenails of both feet 03/05/2019  . Atherosclerosis of native arteries of the extremities with ulceration ([place] 03/05/2019  . Type 2 diabetes mellitus with vascular disease (HHahn[place] 03/05/2019  . Hyperkalemia 01/18/2019  . Complication from renal dialysis device 12/20/2018  . Hyperlipidemia 05/03/2018  . Chronic heart failure with preserved ejection fraction (HCaled[place] 12/28/2017  . 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 (HPist[place] 08/25/2017  . ESRD (end stage renal disease) (HEmerado 08/25/2017  . Paroxysmal atrial fibrillation (HN[place] 05/21/2017  . Hypertension associated with diabetes (HS[place] 05/21/2017  . Type 2 diabetes mellitus with chronic kidney disease on chronic dialysis, without long-term current use of insulin (HChrist[place] 05/21/2017  . Stage 5 chronic kidney disease on chronic dialysis (HS[place] Hill 05/21/2017  . Acute hyperkalemia 04/27/2017    Past Surgical History:  Procedure Laterality Date  . A/V FISTULAGRAM Right 11/13/2019   Procedure: A/V FISTULAGRAM;  Surgeon: SKatha Cabal MD;  [facility];  Service: Cardiovascular;  Laterality: Right;  . AMPUTATION Left 01/09/2020   Procedure: AMPUTATION RAY ( GREAT TOE );  Surgeon: SKatha Cabal MD;  Location: [facility];  Service: Vascular;  Laterality: Left;  . AV FISTULA PLACEMENT Right 09/13/2018   Procedure: INSERTION OF ARTERIOVENOUS (AV) GORE-TEX GRAFT ARM;  Surgeon: SKatha Cabal MD;  Location: [facility];  Service: Vascular;  Laterality: Right;  . CAPD INSERTION N/A 06/08/2017   Procedure: LAPAROSCOPIC  INSERTION CONTINUOUS AMBULATORY PERITONEAL DIALYSIS  (CAPD) CATHETER;  Surgeon: Katha Cabal, MD;  Location: [facility] ORS;  Service: Vascular;  Laterality: N/A;  . CATARACT EXTRACTION, BILATERAL Bilateral   . DIALYSIS/PERMA CATHETER INSERTION N/A 03/18/2017   Procedure: DIALYSIS/PERMA CATHETER INSERTION;  Surgeon: Katha Cabal, MD;  Location: [place] CV LAB;   Service: Cardiovascular;  Laterality: N/A;  . DIALYSIS/PERMA CATHETER REMOVAL N/A 10/20/2017   Procedure: DIALYSIS/PERMA CATHETER REMOVAL;  Surgeon: Algernon Huxley, MD;  Location: [place] [facility] LAB;  Service: Cardiovascular;  Laterality: N/A;  . EYE SURGERY    . LOWER EXTREMITY ANGIOGRAPHY Left 09/11/2019   Procedure: LOWER EXTREMITY ANGIOGRAPHY;  Surgeon: Katha Cabal, MD;  Location: [place] [facility];  Service: Cardiovascular;  Laterality: Left;  . LOWER EXTREMITY ANGIOGRAPHY Right 10/17/2019   Procedure: LOWER EXTREMITY ANGIOGRAPHY;  Surgeon: Katha Cabal, MD;  Location: [place] [facility] LAB;  Service: Cardiovascular;  Laterality: Right;  . LOWER EXTREMITY ANGIOGRAPHY Left 11/20/2019   Procedure: LOWER EXTREMITY ANGIOGRAPHY;  Surgeon: Katha Cabal, MD;  Location: [place] CV LAB;  Service: Cardiovascular;  Laterality: Left;  . PERIPHERAL VASCULAR THROMBECTOMY Right 03/12/2020   Procedure: PERIPHERAL VASCULAR THROMBECTOMY;  Surgeon: Katha Cabal, MD;  Location: [place] [facility] LAB;  Service: Cardiovascular;  Laterality: Right;  . REMOVAL OF A DIALYSIS CATHETER N/A 02/23/2019   Procedure: REMOVAL OF A DIALYSIS CATHETER ( PD CATH);  Surgeon: Katha Cabal, MD;  Location: [facility];  Service: Vascular;  Laterality: N/A;  . TEMPORARY DIALYSIS CATHETER  03/11/2020   Procedure: TEMPORARY DIALYSIS CATHETER;  Surgeon: Katha Cabal, MD;  Location: [place] CV LAB;  Service: Cardiovascular;;    Prior to Admission medications   Medication Sig Start Date End Date Taking? Authorizing Provider  acetaminophen (TYLENOL) 650 MG CR tablet Take 650-1,300 mg by mouth every 8 (eight) hours as needed for pain.    [provider]  allopurinol (ZYLOPRIM) 100 MG tablet TAKE 1 TABLET BY MOUTH EVERYDAY AT BEDTIME Patient taking differently: Take 100 mg by mouth at bedtime. 09/12/19   Elby Beck, FNP  amiodarone (PACERONE) 200 MG tablet Take 1 tablet (200 mg total)  by mouth every morning. Take 1 tablet (200 mg) by mouth once daily Patient taking differently: Take 200 mg by mouth every morning. 10/24/19   Theora Gianotti, NP  amLODipine (NORVASC) 5 MG tablet TAKE 1 TABLET BY MOUTH EVERY DAY Patient taking differently: Take 5 mg by mouth daily. 05/12/20   Elby Beck, FNP  atorvastatin (LIPITOR) 20 MG tablet TAKE 1 TABLET BY MOUTH EVERY DAY IN THE EVENING Patient taking differently: Take 20 mg by mouth every evening. 07/03/20   Elby Beck, FNP  benzonatate (TESSALON) 100 MG capsule Take 100 mg by mouth 3 (three) times daily as needed for cough.    [provider]  clopidogrel (PLAVIX) 75 MG tablet TAKE 1 TABLET BY MOUTH EVERY DAY Patient taking differently: Take 75 mg by mouth daily. 05/09/20   Schnier, Dolores Lory, MD  colchicine 0.6 MG tablet Take 0.6 mg by mouth daily as needed (gout).    [provider]  ELIQUIS 2.5 MG TABS tablet TAKE 1 TABLET BY MOUTH TWICE A DAY Patient taking differently: Take 2.5 mg by mouth 2 (two) times daily. 06/26/20   Elby Beck, FNP  ferric citrate (AURYXIA) 1 GM 210 MG(Fe) tablet Take 210 mg by mouth 3 (three) times daily with meals.    [provider]  HYDROcodone-acetaminophen (NORCO) 5-325 MG tablet Take 1 tablet by mouth every 6 (six) hours as needed for moderate pain or severe pain. 08/04/20   Kris Hartmann, NP  metoprolol tartrate (LOPRESSOR) 25 MG tablet Take 0.5 tablets (12.5 mg total) by mouth 2 (two) times daily. 06/23/20   Schnier, Dolores Lory, MD    Allergies Patient has no known allergies.  Family History  Problem Relation Age of Onset  . Hypertension Father     Social History Social History   Tobacco Use  . Smoking status: Never Smoker  . Smokeless tobacco: Never Used  Vaping Use  . Vaping Use: Never used  Substance Use Topics  . Alcohol use: No  . Drug use: No    Review of Systems  Review of Systems  Unable to perform ROS: Critical  illness      ____________________________________________   PHYSICAL EXAM:  VITAL SIGNS: ED Triage Vitals  Enc Vitals Group     BP 07/26/2020 1917 (!) 222/130     Pulse Rate 08/01/2020 1913 (!) 125     Resp 08/04/2020 1913 (!) 26     Temp --      Temp src --      SpO2 08/02/2020 1913 100 %     Weight --      Height --      Head Circumference --      Peak Flow --      Pain Score --      Pain Loc --      Pain Edu? --      Excl. in [place]? --    Vitals:   08/10/2020 2340 07/29/2020 2345  BP: 92/61 (!) 86/57  Pulse: 60 60  Resp: (!) 27   Temp: 98.6 F (37 C)   SpO2: 100%    Physical Exam Vitals and nursing note reviewed.  Constitutional:      General: She is in acute distress.     Appearance: She is ill-appearing.  HENT:     Head: Normocephalic and atraumatic.     Right Ear: External ear normal.     Left Ear: External ear normal.     Nose: Nose normal.     Mouth/Throat:     Mouth: Mucous membranes are dry.  Eyes:     Pupils:     Right eye: Pupil is not reactive.     Left eye: Pupil is not reactive.  Cardiovascular:     Pulses: Decreased pulses.          Carotid pulses are 0 on the right side and 0 on the left side.      Radial pulses are 0 on the right side and 0 on the left side.       Dorsalis pedis pulses are 0 on the right side and 0 on the left side.  Pulmonary:     Effort: Respiratory distress present.  Abdominal:     General: There is no distension.  Musculoskeletal:        General: No deformity.  Skin:    Capillary Refill: Capillary refill takes more than 3 seconds.  Neurological:     Mental Status: She is unresponsive.      ____________________________________________   LABS (all labs ordered are listed, but only abnormal results are displayed)  Labs Reviewed  BLOOD GAS, VENOUS - Abnormal; Notable for the following components:      Result Value   pO2, Ven 62.0 (*)    Acid-base  deficit 3.9 (*)    All other components within normal limits  CBC WITH  DIFFERENTIAL/PLATELET - Abnormal; Notable for the following components:   WBC 15.0 (*)    RBC 3.15 (*)    Hemoglobin 9.4 (*)    HCT 30.2 (*)    RDW 16.6 (*)    Platelets 580 (*)    Neutro Abs 11.7 (*)    Abs Immature Granulocytes 1.30 (*)    All other components within normal limits  LACTIC ACID, PLASMA - Abnormal; Notable for the following components:   Lactic Acid, Venous >11.0 (*)    All other components within normal limits  LACTIC ACID, PLASMA - Abnormal; Notable for the following components:   Lactic Acid, Venous >11.0 (*)    All other components within normal limits  PROTIME-INR - Abnormal; Notable for the following components:   Prothrombin Time 24.7 (*)    INR 2.3 (*)    All other components within normal limits  PHOSPHORUS - Abnormal; Notable for the following components:   Phosphorus 10.4 (*)    All other components within normal limits  COMPREHENSIVE METABOLIC PANEL - Abnormal; Notable for the following components:   Sodium 150 (*)    Potassium >7.5 (*)    Chloride 97 (*)    CO2 20 (*)    Glucose, Bld 159 (*)    BUN 142 (*)    Creatinine, Ser 20.60 (*)    Calcium 14.2 (*)    Albumin 2.0 (*)    AST 806 (*)    ALT 213 (*)    Alkaline Phosphatase 168 (*)    Total Bilirubin 1.8 (*)    GFR, Estimated 1 (*)    Anion gap 33 (*)    All other components within normal limits  CBG MONITORING, ED - Abnormal; Notable for the following components:   Glucose-Capillary 115 (*)    All other components within normal limits  TROPONIN I (HIGH SENSITIVITY) - Abnormal; Notable for the following components:   Troponin I (High Sensitivity) 464 (*)    All other components within normal limits  CULTURE, BLOOD (ROUTINE X 2)  CULTURE, BLOOD (ROUTINE X 2)  URINE CULTURE  APTT  URINALYSIS, COMPLETE (UACMP) WITH MICROSCOPIC  PHOSPHORUS  CBC  BLOOD GAS, ARTERIAL  TRIGLYCERIDES  CBG MONITORING, ED   ____________________________________________  EKG  Ectopic atrial foci with a  ventricular rate of 101, very wide complex tachycardia with apparent right bundle branch block. ____________________________________________  RADIOLOGY  ED MD interpretation: Bilateral face opacities with ET tube in appropriate position. No pneumothorax clear rib fracture, large effusion or other clear acute thoracic process.  Official radiology report(s): DG Chest Port 1 View  Result Date: 07/30/2020 CLINICAL DATA:  Check endotracheal tube placement EXAM: PORTABLE CHEST 1 VIEW COMPARISON:  07/31/2020 FINDINGS: Endotracheal tube and gastric catheter are noted. The proximal side port of the gastric catheter is noted at the gastroesophageal junction. This could be advanced deeper into the stomach. Cardiac shadow is enlarged. Aortic calcifications are seen. Lungs are well aerated bilaterally. Patchy airspace opacity is noted right greater than left consistent with the known history of prior COVID-19 positivity. This has increased slightly in the interval from the prior exam. IMPRESSION: Tubes and lines as described above. Increasing airspace opacity consistent with the given clinical history of COVID-19 positivity. Gastric catheter could be advanced deeper into the stomach. Electronically Signed   By: Inez Catalina M.D.   On: 08/01/2020 20:11    ____________________________________________   PROCEDURES  Procedure(s) performed (including Critical Care):  Procedure Name: Intubation Date/Time: 07/19/2020 8:17 PM Performed by: Lucrezia Starch, MD Pre-anesthesia Checklist: Patient being monitored and Suction available Oxygen Delivery Method: Ambu bag Preoxygenation: Pre-oxygenation with 100% oxygen Induction Type: Cricoid Pressure applied Laryngoscope Size: Glidescope Tube size: 7.0 mm Number of attempts: 1 Airway Equipment and Method: Video-laryngoscopy and Rigid stylet Placement Confirmation: ETT inserted through vocal cords under direct vision,  Positive ETCO2 and CO2 detector Tube secured  with: ETT holder    .Central Line  Date/Time: 07/14/2020 8:18 PM Performed by: Lucrezia Starch, MD Authorized by: Lucrezia Starch, MD   Consent:    Consent obtained:  Emergent situation   Risks, benefits, and alternatives were discussed: not applicable   Universal protocol:    Patient identity confirmed:  Provided demographic data Pre-procedure details:    Indication(s): central venous access and insufficient peripheral access     Skin preparation:  Chlorhexidine with alcohol and chlorhexidine Sedation:    Sedation type:  None Anesthesia:    Anesthesia method:  None Procedure details:    Location:  R femoral   Site selection rationale:  Ongoing CPR, EJ already attempted   Patient position:  Supine   Procedural supplies:  Triple lumen   Landmarks identified: yes     Ultrasound guidance: yes     Number of attempts:  1   Successful placement: yes   Post-procedure details:    Post-procedure:  Dressing applied   Complications:  One of the caps appeared stuck on one of the lumens. Lumen occlusion device applied and Cut. .Critical Care Performed by: Lucrezia Starch, MD Authorized by: Lucrezia Starch, MD   Critical care provider statement:    Critical care time (minutes):  43   Critical care was necessary to treat or prevent imminent or life-threatening deterioration of the following conditions:  Cardiac failure, circulatory failure, respiratory failure, sepsis, shock and endocrine crisis   Critical care was time spent personally by me on the following activities:  Discussions with consultants, evaluation of patient's response to treatment, examination of patient, ordering and performing treatments and interventions, ordering and review of laboratory studies, ordering and review of radiographic studies, pulse oximetry, re-evaluation of patient's condition, obtaining history from patient or surrogate and review of old charts CPR  Date/Time: 2020-09-07 1:57 AM Performed by: Lucrezia Starch, MD Authorized by: Lucrezia Starch, MD  CPR Procedure Details:    CPR/ACLS performed in the ED: Yes     Duration of CPR (minutes):  40   Outcome: ROSC obtained    CPR performed via ACLS guidelines under my direct supervision.  See RN documentation for details including defibrillator use, medications, doses and timing.     ____________________________________________   INITIAL IMPRESSION / ASSESSMENT AND PLAN / ED COURSE        On arrival to recess area patient was noted to be apneic and pulseless. CPR was immediately initiated. Patient was intubated per procedure note above. Please see our for full details regarding patient's initial resuscitation. In brief patient was coded on and off with multiple rhythms noted including PEA as well as slow and fast V. tach of approximately 45 minutes. She was defibrillated twice with ROSC. Right femoral line was placed by myself as noted above. She was also treated for presumed hyperkalemia given history of ESRD and started on low-dose Levophed. She was empirically treated for sepsis with broad-spectrum antibiotics and blood cultures were obtained. Nephrology was immediately consulted for emergent  dialysis. In addition I discussed patient's presentation and initial work-up with on-call intensivist who stated he would arrange for ICU admission.  VBG obtained on ventilator shows a pH of 7.31, PCO2 of 62, and bicarb of 22. No evidence of acute hypercarbic respiratory failure. Capillary glucose WNL.  CBC remarkable for WBC count of 15, hemoglobin of 9.4 compared to 10.8 on 10 days ago and unremarkable platelets.  Lactic acid greater than 11.  Troponin elevated at 462 compared to 96 11 days ago.  CMP remarkable for hyponatremia with NA of 150, potassium greater than 7.5, bicarb of 20, glucose of 159, BUN of 142, creatinine of 20.6, calcium of 14.2, AST of 806, ALT of 213, T bili of 1.8.  CT head ordered to assess for evidence of intracranial hemorrhage  or other cranial normality.  Chest x-ray markable for treatment per physician with bilateral opacities consistent with recent diagnosis of Covid seen on review of records.  Patient was treated with broad-spectrum antibiotics and blood cultures were obtained.  Difficult to exclude sepsis at this time.  Given concern for cardiac arrest secondary to hyperkalemia in the setting of missed dialysis nephrology was emergently consulted and dialysis started patient was in the ED.  Discussed patient's presentation work-up and management with on-call admitting ICU provider patient will be admitted to ICU service for further evaluation management.      ____________________________________________   FINAL CLINICAL IMPRESSION(S) / ED DIAGNOSES  Final diagnoses:  Cardiac arrest ([place])  Hyperkalemia  High creatinine  Lactic acid acidosis  Sepsis, due to unspecified organism, unspecified whether acute organ dysfunction present [facility])  NSTEMI (non-ST elevated myocardial infarction) ([place])  Hypernatremia  Hypercalcemia    Medications  EPINEPHrine (ADRENALIN) 1 MG/10ML injection (  Not Given 07/17/2020 2045)  EPINEPHrine (ADRENALIN) 1 MG/10ML injection (1 mg Intravenous Given 07/12/2020 1924)  calcium chloride injection (1 g Intravenous Given 07/23/2020 1941)  sodium bicarbonate injection (50 mEq Intravenous Given 07/16/2020 1942)  amiodarone (CORDARONE) injection (300 mg Intravenous Given 07/31/2020 1915)  atropine injection (1 mg Intravenous Given 07/20/2020 1923)  sodium bicarbonate 150 mEq in dextrose 5 % 1,000 mL infusion ( Intravenous New Bag/Given 07/19/2020 1951)  dextrose 50 % solution (1 ampule Intravenous Given 08/09/2020 1956)  Chlorhexidine Gluconate Cloth 2 % PADS 6 each (has no administration in time range)  sodium chloride flush (NS) 0.9 % injection 3 mL (0 mLs Intravenous Hold 07/14/2020 2244)  sodium chloride flush (NS) 0.9 % injection 3 mL (has no administration in time range)  0.9 %  sodium chloride infusion  (has no administration in time range)  acetaminophen (TYLENOL) tablet 650 mg (has no administration in time range)  docusate sodium (COLACE) capsule 100 mg (has no administration in time range)  polyethylene glycol (MIRALAX / GLYCOLAX) packet 17 g (has no administration in time range)  ondansetron (ZOFRAN) injection 4 mg (has no administration in time range)  famotidine (PEPCID) IVPB 20 mg premix (has no administration in time range)  heparin injection 5,000 Units (0 Units Subcutaneous Hold 07/18/2020 2341)  docusate (COLACE) 50 MG/5ML liquid 100 mg (has no administration in time range)  polyethylene glycol (MIRALAX / GLYCOLAX) packet 17 g (has no administration in time range)  propofol (DIPRIVAN) 1000 MG/100ML infusion (has no administration in time range)  fentaNYL (SUBLIMAZE) injection 25 mcg (has no administration in time range)  fentaNYL (SUBLIMAZE) injection 25-100 mcg (has no administration in time range)  midazolam (VERSED) injection 1 mg (has no administration in time range)  midazolam (VERSED) injection  1 mg (has no administration in time range)  vasopressin (PITRESSIN) 20 Units in sodium chloride 0.9 % 100 mL infusion-*FOR SHOCK* (0.03 Units/min Intravenous New Bag/Given 07/30/2020 2301)  norepinephrine (LEVOPHED) 16 mg in 278m premix infusion (40 mcg/min Intravenous New Bag/Given 07/12/2020 2259)  phenylephrine CONCENTRATED '100mg'$  in sodium chloride 0.9% 2579m(0.'4mg'$ /mL) infusion (20 mcg/min Intravenous New Bag/Given 2/02-06-2022000)  albuterol (PROVENTIL) (2.5 MG/3ML) 0.083% nebulizer solution 10 mg (10 mg Nebulization Given 08/01/2020 1945)  piperacillin-tazobactam (ZOSYN) IVPB 2.25 g (0 g Intravenous Stopped 07/14/2020 2053)  sodium zirconium cyclosilicate (LOKELMA) packet 10 g (10 g Per Tube Given 08/02/2020 2038)  insulin aspart (novoLOG) injection 5 Units (5 Units Intravenous Given 07/23/2020 1931)  LORazepam (ATIVAN) injection 1 mg (1 mg Intravenous Given 08/01/2020 1930)  hydrocortisone sodium  succinate (SOLU-CORTEF) 100 MG injection 100 mg (100 mg Intravenous Given 07/13/2020 2326)     ED Discharge Orders    None       Note:  This document was prepared using Dragon voice recognition software and may include unintentional dictation errors.   SmLucrezia StarchMD [id number]    SmLucrezia StarchMD [id number]    SmLucrezia StarchMD [id number]

## 2020-08-11 NOTE — ED Notes (Signed)
Pt having seizure like activity.

## 2020-08-11 NOTE — ED Notes (Signed)
$'1mg'm$  Epinephrine being administered.

## 2020-08-11 NOTE — ED Notes (Signed)
Mortimer Fries, MD notified via secure chat of K+ of 7.7.

## 2020-08-11 NOTE — ED Notes (Signed)
Dialysis RN at bedside. Will continue to monitor

## 2020-08-11 NOTE — ED Notes (Addendum)
Pulse check. Asystole on monitor. CPR resumed.

## 2020-08-11 NOTE — ED Notes (Signed)
Pts sister reports pt initially brought in for dialysis. Pt usually a T,TH, Sat, dialysis pt. Last had dialysis here on Friday.

## 2020-08-11 NOTE — ED Notes (Signed)
Bear hugger being appllied at this time,

## 2020-08-11 NOTE — ED Notes (Signed)
ROSC achieved with ST on monitor with a rate of 106.

## 2020-08-11 NOTE — ED Notes (Signed)
Right femoral cental line being placed.

## 2020-08-11 NOTE — ED Notes (Signed)
Pt bradycardic with a rate in 40s. Central pulses palpable.

## 2020-08-11 NOTE — Progress Notes (Signed)
[facility]  ROUNDING NOTE   Subjective:    Patient was brought in by EMS for altered mental status.  She has not been going to outpatient dialysis because of leg weakness and because of weather emergency and snow. Her granddaughter reports that she has been acting weird today.  In the ER she was noticed to have a wide-complex rhythm.  CPR was initiated and ACLS protocol medications given.  Patient is now intubated and on ventilator support Labs are not available at this time but she is presumed to have hyperkalemia due to wide-complex rhythm.  Objective:  Vital signs in last 24 hours:  Pulse Rate:  [39-125] 39 (01/31 1928) Resp:  [9-39] 26 (01/31 1930) BP: (124-222)/(102-154) 163/110 (01/31 1932) SpO2:  [84 %-100 %] 96 % (01/31 1928)  Weight change:  There were no vitals filed for this visit.  Intake/Output: No intake/output data recorded.   Intake/Output this shift:  No intake/output data recorded.  Physical Exam: General:  Critically ill-appearing  Head:  ET tube in place, OG tube in place  Lungs:   Ventilator assisted  Heart:  Irregular, tachycardic  Abdomen:   Soft  Extremities:  Trace edema  Neurologic:  Sedated  Skin:  Warm, dry  Access:  Right arm AV graft, bruit present    Basic Metabolic Panel: No results for input(s): NA, K, CL, CO2, GLUCOSE, BUN, CREATININE, CALCIUM, MG, PHOS in the last 168 hours.  Liver Function Tests: No results for input(s): AST, ALT, ALKPHOS, BILITOT, PROT, ALBUMIN in the last 168 hours. No results for input(s): LIPASE, AMYLASE in the last 168 hours. No results for input(s): AMMONIA in the last 168 hours.  CBC: No results for input(s): WBC, NEUTROABS, HGB, HCT, MCV, PLT in the last 168 hours.  Cardiac Enzymes: No results for input(s): CKTOTAL, CKMB, CKMBINDEX, TROPONINI in the last 168 hours.  BNP: Invalid input(s): POCBNP  CBG: Recent Labs  Lab 08/08/2020 1901 07/31/2020 1932  GLUCAP 5 115*     Microbiology: Results for orders placed or performed during the hospital encounter of 07/31/20  MRSA PCR Screening     Status: None   Collection Time: 08/01/20  2:34 AM   Specimen: Nasopharyngeal  Result Value Ref Range Status   MRSA by PCR NEGATIVE NEGATIVE Final    Comment:        The GeneXpert MRSA Assay (FDA approved for NASAL specimens only), is one component of a comprehensive MRSA colonization surveillance program. It is not intended to diagnose MRSA infection nor to guide or monitor treatment for MRSA infections. Performed at [facility], [address] [place], [place] [address]     Coagulation Studies: No results for input(s): LABPROT, INR in the last 72 hours.  Urinalysis: No results for input(s): COLORURINE, LABSPEC, PHURINE, GLUCOSEU, HGBUR, BILIRUBINUR, KETONESUR, PROTEINUR, UROBILINOGEN, NITRITE, LEUKOCYTESUR in the last 72 hours.  Invalid input(s): APPERANCEUR    Imaging: No results found.   Medications:   . norepinephrine (LEVOPHED) Adult infusion 5 mcg/min (07/19/2020 1930)  . piperacillin-tazobactam    . sodium bicarbonate (isotonic) 150 mEq in D5W 1000 mL infusion     . [START ON 2020/08/28] Chlorhexidine Gluconate Cloth  6 each Topical Q0600  . EPINEPHrine      . insulin aspart  5 Units Intravenous Once  . sodium zirconium cyclosilicate  10 g Per Tube Once   amiodarone, atropine, calcium chloride, dextrose, EPINEPHrine, sodium bicarbonate  Assessment/ Plan:   Stacy Pham is a 87 y.o. black (  [place]) female  with ESRD on HD, paroxysmal atrial fibrillation, peripheral vascular disease, diabetes mellitus type 2, pretension, hyperlipidemia, anemia of chronic kidney disease, secondary hyperparathyroidism who was admitted on January 31  1.  ESRD on HD TTS at [place] [facility].  Last dialysis was about a week ago on 08/01/2020 We will dialyze her today emergently for presumed hyperkalemia Will need to be admitted to  ICU  2.  Anemia of chronic kidney disease.   Lab Results  Component Value Date   HGB 10.8 (L) 08/01/2020  Labs are pending  3.  Secondary hyperparathyroidism.   Lab Results  Component Value Date   CALCIUM 8.8 (L) 08/01/2020   CAION 1.03 (L) 01/09/2020   PHOS 4.0 02/21/2018  Monitor calcium phosphorus during this admission  4.  COVID-19 pneumonia.   Diagnosed July 21, 2020  5.  Acute respiratory failure Intubated in the emergency room Currently ventilator assisted   LOS: 0 Chara Marquard 1/31/20228:13 PM

## 2020-08-11 NOTE — H&P (Addendum)
Name: Stacy Pham MRN: [id number] DOB: [birth year]     CONSULTATION DATE: 07/21/2020  REFERRING MD :Tamala Julian  CHIEF COMPLAINT: acute cardiac arrest   HISTORY OF PRESENT ILLNESS:   87 yo AAF with ESRD missed HD last week due to snow +increased lethargy and somnolence-bought in by family to evaluate  CPR performed in hallway by staff due to no pulse on check and agonal breathing. Daughter reported to RN that she was worried about her mother due to missing dialysis since last week,  6:59 PM patient wth acute cardiac arrest/asystole-ACLS protocol started 7:07remains  ASYSTOLE  7:09 V tach s/p SHock 7:14 ROSC 7:21 severe bradycardia 7:28 CVL placed, seizure like activity 7:35 ASYSTOLE-ACLS protocol initiated again 7:39 ROSC  CMP not available at time of CARDIAC ARREST At some point in [place] INTUBATED   Patient now with agonal respirations HD to start Signs and symptoms of brain damage    PAST MEDICAL HISTORY :   has a past medical history of (HFpEF) heart failure with preserved ejection fraction ([place]), Arthritis, Back pain, Bronchitis, Cataract, Diabetes mellitus without complication ([place]), Dialysis patient ([place]), Dysrhythmia, ESRD (end stage renal disease) ([place]), Gout, History of methicillin resistant staphylococcus aureus (MRSA), Hypertension, and PAF (paroxysmal atrial fibrillation) ([place]).  has a past surgical history that includes DIALYSIS/PERMA CATHETER INSERTION (N/A, 03/18/2017); Cataract extraction, bilateral (Bilateral); CAPD insertion (N/A, 06/08/2017); DIALYSIS/PERMA CATHETER REMOVAL (N/A, 10/20/2017); Eye surgery; AV fistula placement (Right, 09/13/2018); Removal of a dialysis catheter (N/A, 02/23/2019); Lower Extremity Angiography (Left, 09/11/2019); Lower Extremity Angiography (Right, 10/17/2019); A/V Fistulagram (Right, 11/13/2019); Lower Extremity Angiography (Left, 11/20/2019); Amputation (Left, 01/09/2020); TEMPORARY DIALYSIS CATHETER (03/11/2020); and PERIPHERAL  VASCULAR THROMBECTOMY (Right, 03/12/2020). Prior to Admission medications   Medication Sig Start Date End Date Taking? Authorizing Provider  acetaminophen (TYLENOL) 650 MG CR tablet Take 650-1,300 mg by mouth every 8 (eight) hours as needed for pain.    [provider]  allopurinol (ZYLOPRIM) 100 MG tablet TAKE 1 TABLET BY MOUTH EVERYDAY AT BEDTIME Patient taking differently: Take 100 mg by mouth at bedtime. 09/12/19   Elby Beck, FNP  amiodarone (PACERONE) 200 MG tablet Take 1 tablet (200 mg total) by mouth every morning. Take 1 tablet (200 mg) by mouth once daily Patient taking differently: Take 200 mg by mouth every morning. 10/24/19   Theora Gianotti, NP  amLODipine (NORVASC) 5 MG tablet TAKE 1 TABLET BY MOUTH EVERY DAY Patient taking differently: Take 5 mg by mouth daily. 05/12/20   Elby Beck, FNP  atorvastatin (LIPITOR) 20 MG tablet TAKE 1 TABLET BY MOUTH EVERY DAY IN THE EVENING Patient taking differently: Take 20 mg by mouth every evening. 07/03/20   Elby Beck, FNP  benzonatate (TESSALON) 100 MG capsule Take 100 mg by mouth 3 (three) times daily as needed for cough.    [provider]  clopidogrel (PLAVIX) 75 MG tablet TAKE 1 TABLET BY MOUTH EVERY DAY Patient taking differently: Take 75 mg by mouth daily. 05/09/20   Schnier, Dolores Lory, MD  colchicine 0.6 MG tablet Take 0.6 mg by mouth daily as needed (gout).    [provider]  ELIQUIS 2.5 MG TABS tablet TAKE 1 TABLET BY MOUTH TWICE A DAY Patient taking differently: Take 2.5 mg by mouth 2 (two) times daily. 06/26/20   Elby Beck, FNP  ferric citrate (AURYXIA) 1 GM 210 MG(Fe) tablet Take 210 mg by mouth 3 (three) times daily with meals.    [provider]  HYDROcodone-acetaminophen (NORCO) 5-325 MG tablet Take 1 tablet by mouth every 6 (six) hours as needed for moderate pain or severe pain. 08/04/20   Kris Hartmann, NP  metoprolol tartrate (LOPRESSOR) 25 MG tablet  Take 0.5 tablets (12.5 mg total) by mouth 2 (two) times daily. 06/23/20   Schnier, Dolores Lory, MD   No Known Allergies  FAMILY HISTORY:  family history includes Hypertension in her father. SOCIAL HISTORY:  reports that she has never smoked. She has never used smokeless tobacco. She reports that she does not drink alcohol and does not use drugs.  REVIEW OF SYSTEMS:   Unable to obtain due to critical illness      Estimated body mass index is 27.1 kg/m as calculated from the following:   Height as of 08/01/20: '5\' 3"'$  (1.6 m).   Weight as of 08/01/20: 69.4 kg.    VITAL SIGNS: Temp:  [93.7 F (34.3 C)-98 F (36.7 C)] 98 F (36.7 C) (01/31 2050) Pulse Rate:  [25-125] 63 (01/31 2050) Resp:  [9-39] 25 (01/31 2050) BP: (89-222)/(52-154) 110/56 (01/31 2050) SpO2:  [84 %-100 %] 95 % (01/31 2050)   No intake/output data recorded. No intake/output data recorded.   SpO2: 95 %   Physical Examination:  GENERAL:critically ill appearing, +resp distress HEAD: Normocephalic, atraumatic.  EYES: Pupils equal, round, reactive to light.  No scleral icterus.  MOUTH: Moist mucosal membrane. NECK: Supple. No JVD.  PULMONARY: +rhonchi, +wheezing CARDIOVASCULAR: S1 and S2. Regular rate and rhythm. No murmurs, rubs, or gallops.  GASTROINTESTINAL: Soft, nontender, -distended.  Positive bowel sounds.  MUSCULOSKELETAL: No swelling, clubbing, or edema.  NEUROLOGIC: obtunded SKIN:intact,warm,dry    MEDICATIONS: I have reviewed all medications and confirmed regimen as documented   CULTURE RESULTS   No results found for this or any previous visit (from the past 240 hour(s)).        IMAGING    DG Chest Port 1 View  Result Date: 07/13/2020 CLINICAL DATA:  Check endotracheal tube placement EXAM: PORTABLE CHEST 1 VIEW COMPARISON:  07/31/2020 FINDINGS: Endotracheal tube and gastric catheter are noted. The proximal side port of the gastric catheter is noted at the gastroesophageal junction.  This could be advanced deeper into the stomach. Cardiac shadow is enlarged. Aortic calcifications are seen. Lungs are well aerated bilaterally. Patchy airspace opacity is noted right greater than left consistent with the known history of prior COVID-19 positivity. This has increased slightly in the interval from the prior exam. IMPRESSION: Tubes and lines as described above. Increasing airspace opacity consistent with the given clinical history of COVID-19 positivity. Gastric catheter could be advanced deeper into the stomach. Electronically Signed   By: Inez Catalina M.D.   On: 07/23/2020 20:11     Nutrition Status:           Indwelling Urinary Catheter continued, requirement due to   Reason to continue Indwelling Urinary Catheter strict Intake/Output monitoring for hemodynamic instability   Central Line/ continued, requirement due to  Reason to continue [place] of central venous pressure or other hemodynamic parameters and poor IV access   Ventilator continued, requirement due to severe respiratory failure   Ventilator Sedation RASS 0 to -2      ASSESSMENT AND PLAN SYNOPSIS 87 yo AAF with multiple medical issues missed HD for 1 week with recent dx of COVID 19  Infection with acute and sudden cardiac arrest  likely due to acute ischemic cardiomyopathy with severe lactic acidosis with high potassium   Severe ACUTE  Hypoxic and Hypercapnic Respiratory Failure -continue Full MV support -continue Bronchodilator Therapy -Wean Fio2 and PEEP as tolerated -VAP/VENT bundle implementation  ACUTE SYSTOLIC CARDIAC FAILURE-ISCHEMIC CARDIOMYOPATHY Vent support PATIENT WITH PROLONGED CARDIAC ARREST WITH VERY UNFAVORABLE OUTCOME, PATIENT IS NOT A CANDIDATE FOR HYPOTHERMIA PROTOCOL    END STAGE KIDNEY INJURY/Renal Failure -continue Foley Catheter-assess need -Avoid nephrotoxic agents -Follow urine output, BMP -Ensure adequate renal perfusion, optimize oxygenation -Renal  dose medications HD as needed     NEUROLOGY Acute toxic metabolic encephalopathy, need for sedation due to increased WOB Goal RASS -2 to -3 Patient showing signs of brain damage  CARDIAC ICU monitoring   GI GI PROPHYLAXIS as indicated  NUTRITIONAL STATUS DIET-->NPO Constipation protocol as indicated   ENDO - will use ICU hypoglycemic\Hyperglycemia protocol if needed    ELECTROLYTES -follow labs as needed -replace as needed -pharmacy consultation and following    DVT/GI PRX ordered and assessed TRANSFUSIONS AS NEEDED MONITOR FSBS I Assessed the need for Labs I Assessed the need for Foley I Assessed the need for Central Venous Line Family Discussion when available I Assessed the need for Mobilization I made an Assessment of medications to be adjusted accordingly Safety Risk assessment Completed  CASE DISCUSSED IN MULTIDISCIPLINARY ROUNDS WITH ICU TEAM   Critical Care Time devoted to patient care services described in this note is 76  minutes.   Overall, patient is critically ill, prognosis is guarded.  Patient with Multiorgan failure and at high risk for cardiac arrest and death.   Prognosis is very poor, very poor chance of meaningful recovery  Corrin Parker, M.D.  [facility]  Medical Director [place] Director [facility] Cardio-Pulmonary Department

## 2020-08-11 NOTE — ED Notes (Signed)
Calcium Chloride going in.

## 2020-08-11 NOTE — ED Notes (Signed)
Atropine given. 4mq Bicarb given

## 2020-08-11 NOTE — ED Notes (Addendum)
Pulse check. VTACH on monitor without a pulse. 200 joules shock delivered. CPR resumed.

## 2020-08-11 NOTE — ED Notes (Signed)
Bear hugger removed from pt at this time.

## 2020-08-11 NOTE — ED Notes (Signed)
Asystole on monitor. CPR initiated.

## 2020-08-11 NOTE — ED Notes (Signed)
Dialysis RN at bedside setting up for dialysis at this time.

## 2020-08-11 NOTE — ED Notes (Signed)
ROSC

## 2020-08-11 NOTE — ED Notes (Signed)
Pulse check. Asystole on monitor. CPR resumed.

## 2020-08-11 NOTE — ED Notes (Signed)
Pt being brought back and began having agonal breathing. CPR started.

## 2020-08-11 NOTE — ED Notes (Signed)
Asystole on monitor. CPR resumed.

## 2020-08-11 NOTE — ED Notes (Signed)
Per triage RN Sam with bedside report as CPR performed in hallway by staff due to no pulse on check and agonal breathing. Daughter reported to RN that she was worried about her mother due to missing dialysis since last week, unknown date at this time. When RN Sam approached pt to triage, pt not responding and brought straight back.

## 2020-08-11 NOTE — ED Notes (Addendum)
Pulse check. ROSC achieved. ST on monitor.

## 2020-08-11 NOTE — ED Notes (Signed)
CPR in progress. Pt being ventilated with BVM. Left tibial  IO placed.

## 2020-08-11 NOTE — ED Notes (Signed)
Pt suctioned. No other changes in pt condition at this time. Will continue to monitor.

## 2020-08-11 NOTE — ED Notes (Signed)
Central line being placed

## 2020-08-12 ENCOUNTER — Inpatient Hospital Stay: Payer: Medicare Other

## 2020-08-12 DIAGNOSIS — E875 Hyperkalemia: Secondary | ICD-10-CM | POA: Diagnosis not present

## 2020-08-12 DIAGNOSIS — I469 Cardiac arrest, cause unspecified: Secondary | ICD-10-CM | POA: Diagnosis not present

## 2020-08-12 LAB — CBG MONITORING, ED
Glucose-Capillary: 100 mg/dL — ABNORMAL HIGH (ref 70–99)
Glucose-Capillary: 107 mg/dL — ABNORMAL HIGH (ref 70–99)
Glucose-Capillary: 152 mg/dL — ABNORMAL HIGH (ref 70–99)

## 2020-08-12 LAB — BLOOD CULTURE ID PANEL (REFLEXED) - BCID2

## 2020-08-12 LAB — BASIC METABOLIC PANEL
Anion gap: 34 — ABNORMAL HIGH (ref 5–15)
BUN: 81 mg/dL — ABNORMAL HIGH (ref 8–23)
CO2: 18 mmol/L — ABNORMAL LOW (ref 22–32)
Calcium: 9.2 mg/dL (ref 8.9–10.3)
Chloride: 90 mmol/L — ABNORMAL LOW (ref 98–111)
Creatinine, Ser: 13.49 mg/dL — ABNORMAL HIGH (ref 0.44–1.00)
GFR, Estimated: 2 mL/min — ABNORMAL LOW (ref >60)
Glucose, Bld: 109 mg/dL — ABNORMAL HIGH (ref 70–99)
Potassium: 5.7 mmol/L — ABNORMAL HIGH (ref 3.5–5.1)
Sodium: 142 mmol/L (ref 135–145)

## 2020-08-12 LAB — BLOOD GAS, ARTERIAL
Acid-base deficit: 9.5 mmol/L — ABNORMAL HIGH (ref 0.0–2.0)
Bicarbonate: 15.1 mmol/L — ABNORMAL LOW (ref 20.0–28.0)
FIO2: 1
MECHVT: 450 mL
O2 Saturation: 99.7 %
PEEP: 5 cmH2O
Patient temperature: 37
RATE: 16 resp/min
pCO2 arterial: 28 mmHg — ABNORMAL LOW (ref 32.0–48.0)
pH, Arterial: 7.34 — ABNORMAL LOW (ref 7.350–7.450)
pO2, Arterial: 217 mmHg — ABNORMAL HIGH (ref 83.0–108.0)

## 2020-08-12 LAB — CBC
HCT: 34.1 % — ABNORMAL LOW (ref 36.0–46.0)
Hemoglobin: 10.8 g/dL — ABNORMAL LOW (ref 12.0–15.0)
MCH: 30.2 pg (ref 26.0–34.0)
MCHC: 31.7 g/dL (ref 30.0–36.0)
MCV: 95.3 fL (ref 80.0–100.0)
Platelets: 518 10*3/uL — ABNORMAL HIGH (ref 150–400)
RBC: 3.58 MIL/uL — ABNORMAL LOW (ref 3.87–5.11)
RDW: 17.2 % — ABNORMAL HIGH (ref 11.5–15.5)
WBC: 23.9 10*3/uL — ABNORMAL HIGH (ref 4.0–10.5)
nRBC: 0.6 % — ABNORMAL HIGH (ref 0.0–0.2)

## 2020-08-12 LAB — GLUCOSE, CAPILLARY
Glucose-Capillary: 126 mg/dL — ABNORMAL HIGH (ref 70–99)
Glucose-Capillary: 160 mg/dL — ABNORMAL HIGH (ref 70–99)
Glucose-Capillary: 67 mg/dL — ABNORMAL LOW (ref 70–99)

## 2020-08-12 LAB — COMPREHENSIVE METABOLIC PANEL
ALT: 522 U/L — ABNORMAL HIGH (ref 0–44)
AST: 2275 U/L — ABNORMAL HIGH (ref 15–41)
Albumin: 1.9 g/dL — ABNORMAL LOW (ref 3.5–5.0)
Alkaline Phosphatase: 377 U/L — ABNORMAL HIGH (ref 38–126)
Anion gap: 30 — ABNORMAL HIGH (ref 5–15)
BUN: 78 mg/dL — ABNORMAL HIGH (ref 8–23)
CO2: 20 mmol/L — ABNORMAL LOW (ref 22–32)
Calcium: 10.5 mg/dL — ABNORMAL HIGH (ref 8.9–10.3)
Chloride: 92 mmol/L — ABNORMAL LOW (ref 98–111)
Creatinine, Ser: 12.54 mg/dL — ABNORMAL HIGH (ref 0.44–1.00)
GFR, Estimated: 3 mL/min — ABNORMAL LOW (ref >60)
Glucose, Bld: 28 mg/dL — CL (ref 70–99)
Potassium: 6.3 mmol/L (ref 3.5–5.1)
Sodium: 142 mmol/L (ref 135–145)
Total Bilirubin: 2.2 mg/dL — ABNORMAL HIGH (ref 0.3–1.2)
Total Protein: 6.6 g/dL (ref 6.5–8.1)

## 2020-08-12 LAB — TRIGLYCERIDES: Triglycerides: 179 mg/dL — ABNORMAL HIGH (ref <150)

## 2020-08-12 LAB — MRSA PCR SCREENING: MRSA by PCR: NEGATIVE

## 2020-08-12 LAB — PHOSPHORUS: Phosphorus: 7.7 mg/dL — ABNORMAL HIGH (ref 2.5–4.6)

## 2020-08-12 LAB — TROPONIN I (HIGH SENSITIVITY)
Troponin I (High Sensitivity): 144 ng/L (ref <18)
Troponin I (High Sensitivity): 1697 ng/L (ref <18)
Troponin I (High Sensitivity): 3033 ng/L (ref <18)
Troponin I (High Sensitivity): 3254 ng/L (ref <18)

## 2020-08-12 LAB — APTT: aPTT: >160 seconds (ref 24–36)

## 2020-08-12 LAB — HEPARIN LEVEL (UNFRACTIONATED): Heparin Unfractionated: 1.6 IU/mL — ABNORMAL HIGH (ref 0.30–0.70)

## 2020-08-12 MED ORDER — SODIUM BICARBONATE 8.4 % IV SOLN
INTRAVENOUS | Status: AC
  Filled 2020-08-12: qty 50

## 2020-08-12 MED ORDER — DEXTROSE 50 % IV SOLN
1.0000 | Freq: Once | INTRAVENOUS | Status: AC
  Administered 2020-08-12: 50 mL via INTRAVENOUS

## 2020-08-12 MED ORDER — ATROPINE SULFATE 1 MG/10ML IJ SOSY: 1.0000 mg | PREFILLED_SYRINGE | Freq: Once | INTRAMUSCULAR | Status: AC

## 2020-08-12 MED ORDER — SODIUM BICARBONATE 8.4 % IV SOLN
50.0000 meq | Freq: Once | INTRAVENOUS | Status: AC
  Administered 2020-08-12: 50 meq via INTRAVENOUS

## 2020-08-12 MED ORDER — DEXTROSE 50 % IV SOLN
INTRAVENOUS | Status: AC
  Filled 2020-08-12: qty 50

## 2020-08-12 MED ORDER — ATROPINE SULFATE 1 MG/10ML IJ SOSY
PREFILLED_SYRINGE | INTRAMUSCULAR | Status: AC
  Administered 2020-08-12: 1 mg via INTRAVENOUS
  Filled 2020-08-12: qty 10

## 2020-08-12 MED ORDER — ATROPINE SULFATE 1 MG/10ML IJ SOSY
PREFILLED_SYRINGE | INTRAMUSCULAR | Status: AC
  Filled 2020-08-12: qty 10

## 2020-08-12 MED ORDER — SODIUM BICARBONATE 8.4 % IV SOLN
INTRAVENOUS | Status: AC
  Administered 2020-08-12: 50 meq via INTRAVENOUS
  Filled 2020-08-12: qty 50

## 2020-08-12 MED ORDER — ATROPINE SULFATE 1 MG/10ML IJ SOSY: 1.0000 mg | PREFILLED_SYRINGE | Freq: Once | INTRAMUSCULAR | Status: DC

## 2020-08-12 MED ORDER — ORAL CARE MOUTH RINSE
15.0000 mL | OROMUCOSAL | Status: DC
  Administered 2020-08-12: 15 mL via OROMUCOSAL

## 2020-08-12 MED ORDER — HEPARIN (PORCINE) 25000 UT/250ML-% IV SOLN
800.0000 [IU]/h | INTRAVENOUS | Status: DC
  Administered 2020-08-12: 800 [IU]/h via INTRAVENOUS
  Filled 2020-08-12: qty 250

## 2020-08-12 MED ORDER — INSULIN ASPART 100 UNIT/ML ~~LOC~~ SOLN
SUBCUTANEOUS | Status: AC
  Filled 2020-08-12: qty 1

## 2020-08-12 MED ORDER — SODIUM BICARBONATE 8.4 % IV SOLN: 50.0000 meq | Freq: Once | INTRAVENOUS | Status: AC

## 2020-08-12 MED ORDER — HEPARIN (PORCINE) 25000 UT/250ML-% IV SOLN: 800.0000 [IU]/h | INTRAVENOUS | Status: DC

## 2020-08-12 MED ORDER — ALBUTEROL SULFATE (2.5 MG/3ML) 0.083% IN NEBU
2.5000 mg | INHALATION_SOLUTION | Freq: Once | RESPIRATORY_TRACT | Status: AC
  Administered 2020-08-12: 2.5 mg via RESPIRATORY_TRACT
  Filled 2020-08-12: qty 3

## 2020-08-12 MED ORDER — HEPARIN (PORCINE) 25000 UT/250ML-% IV SOLN: 600.0000 [IU]/h | INTRAVENOUS | Status: DC

## 2020-08-12 MED ORDER — PATIROMER SORBITEX CALCIUM 8.4 G PO PACK
25.2000 g | PACK | Freq: Every day | ORAL | Status: DC
  Filled 2020-08-12: qty 3

## 2020-08-12 MED ORDER — ALBUTEROL SULFATE (2.5 MG/3ML) 0.083% IN NEBU
10.0000 mg | INHALATION_SOLUTION | Freq: Once | RESPIRATORY_TRACT | Status: AC
  Administered 2020-08-12: 10 mg via RESPIRATORY_TRACT
  Filled 2020-08-12: qty 12

## 2020-08-12 MED ORDER — INSULIN REGULAR HUMAN 100 UNIT/ML IJ SOLN
10.0000 [IU] | Freq: Once | INTRAMUSCULAR | Status: AC
  Administered 2020-08-12: 10 [IU] via INTRAVENOUS
  Filled 2020-08-12: qty 3

## 2020-08-12 MED ORDER — HEPARIN BOLUS VIA INFUSION
4000.0000 [IU] | Freq: Once | INTRAVENOUS | Status: AC
  Administered 2020-08-12: 4000 [IU] via INTRAVENOUS
  Filled 2020-08-12: qty 4000

## 2020-08-12 MED ORDER — DEXTROSE 50 % IV SOLN
25.0000 mL | INTRAVENOUS | Status: DC | PRN
  Administered 2020-08-12: 50 mL via INTRAVENOUS

## 2020-08-12 MED ORDER — MUPIROCIN CALCIUM 2 % EX CREA
TOPICAL_CREAM | Freq: Two times a day (BID) | CUTANEOUS | Status: DC
  Filled 2020-08-12: qty 15

## 2020-08-12 MED ORDER — CHLORHEXIDINE GLUCONATE 0.12% ORAL RINSE (MEDLINE KIT)
15.0000 mL | Freq: Two times a day (BID) | OROMUCOSAL | Status: DC
  Administered 2020-08-12: 15 mL via OROMUCOSAL

## 2020-08-12 DEATH — deceased

## 2020-08-14 LAB — CULTURE, BLOOD (ROUTINE X 2): Special Requests: ADEQUATE

## 2020-08-15 LAB — COMPREHENSIVE METABOLIC PANEL
ALT: 213 U/L — ABNORMAL HIGH (ref 0–44)
AST: 806 U/L — ABNORMAL HIGH (ref 15–41)
Albumin: 2 g/dL — ABNORMAL LOW (ref 3.5–5.0)
Alkaline Phosphatase: 168 U/L — ABNORMAL HIGH (ref 38–126)
Anion gap: 33 — ABNORMAL HIGH (ref 5–15)
BUN: 142 mg/dL — ABNORMAL HIGH (ref 8–23)
CO2: 20 mmol/L — ABNORMAL LOW (ref 22–32)
Calcium: 14.2 mg/dL (ref 8.9–10.3)
Chloride: 97 mmol/L — ABNORMAL LOW (ref 98–111)
Creatinine, Ser: 20.6 mg/dL — ABNORMAL HIGH (ref 0.44–1.00)
GFR, Estimated: 1 mL/min — ABNORMAL LOW (ref >60)
Glucose, Bld: 159 mg/dL — ABNORMAL HIGH (ref 70–99)
Potassium: >7.5 mmol/L (ref 3.5–5.1)
Sodium: 150 mmol/L — ABNORMAL HIGH (ref 135–145)
Total Bilirubin: 1.8 mg/dL — ABNORMAL HIGH (ref 0.3–1.2)
Total Protein: 6.7 g/dL (ref 6.5–8.1)

## 2020-08-15 LAB — CULTURE, BLOOD (ROUTINE X 2): Special Requests: ADEQUATE

## 2020-08-15 MED FILL — Medication: Qty: 1 | Status: AC

## 2020-08-18 NOTE — ED Notes (Signed)
This RN called pt's name. Granddaughter with pt and raised hand. This RN approached pt and stated pt's name. Pt assisted to move legs around and straight in wheelchair. Pt able to assist a little. This RN called pt's name and instructed her that we need to move her around so she wouldn't fall from chair. Pt's eyes rolled back and pt went unresponsive with this RN. RN yelled to first nurse that I needed a bed. Security and CT employee assisting this RN with pt's leg and rushed pt back to the back. This RN yelled I need a bed, a doctor and some staff now. Staff and MD Cinda Quest rushed to assist. MD Z. Smith to bedside. Other staff grabbing code cart and supplies.  This RN did not feel a pulse while rushing pt back and pt was assisted to stretcher in 1hallway. This RN checked for pulse and no pulse found. RN Mimi at bedside. This RN instructed RN to start CPR and informed MD Cinda Quest and Tamala Julian that there was no pulse. This RN along with other staff began coding pt and attempting IV.

## 2020-08-19 ENCOUNTER — Ambulatory Visit: Admission: RE | Admit: 2020-08-19 | Payer: Medicare Other | Source: Home / Self Care | Admitting: Vascular Surgery

## 2020-08-19 DIAGNOSIS — I70299 Other atherosclerosis of native arteries of extremities, unspecified extremity: Secondary | ICD-10-CM

## 2020-08-19 SURGERY — LOWER EXTREMITY ANGIOGRAPHY
Anesthesia: Moderate Sedation | Site: Leg Lower | Laterality: Right

## 2020-08-30 IMAGING — CT CT HEAD WITHOUT CONTRAST
3 series · 15 of 45 positions shown, 18 images · non-contrast
Comparison: None.

CLINICAL DATA: Pain status post fall

EXAM:
CT HEAD WITHOUT CONTRAST
TECHNIQUE: Contiguous axial images were obtained from the base of the skull
through the vertex without intravenous contrast.

[Series 3: head wo · axial · 0.42mm/px · z∈[-130,-15]mm · 9 of 28 slices shown, 12 images]
[im 3/28  brain]
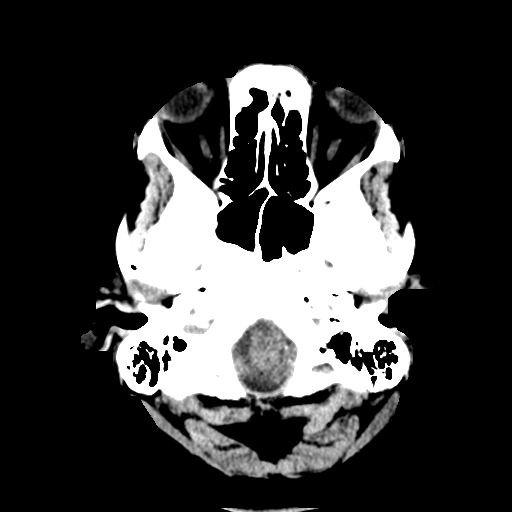
[im 3/28  bone]
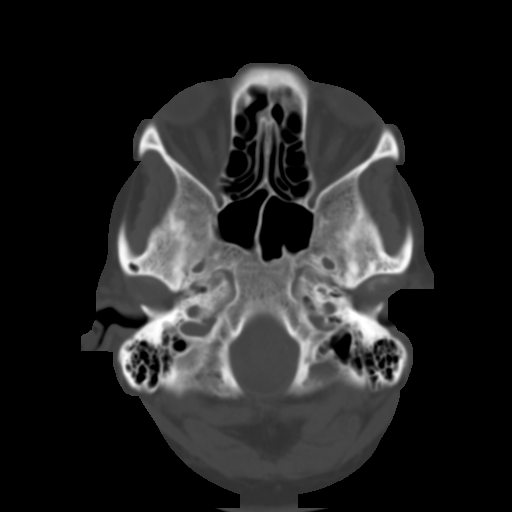
[im 6/28  brain]
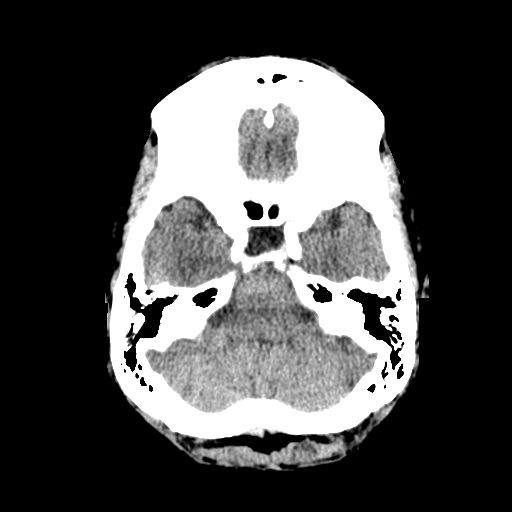
[im 9/28  brain]
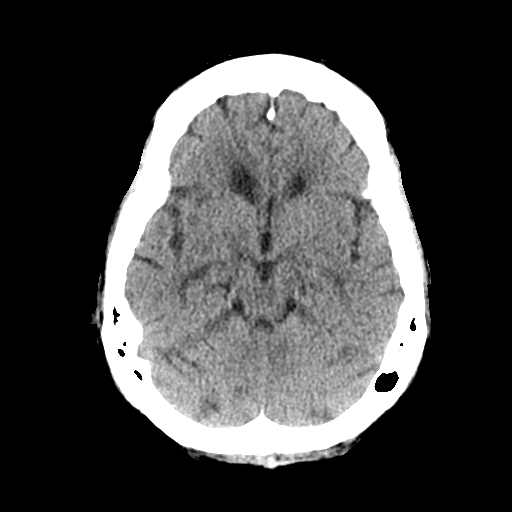
[im 12/28  brain]
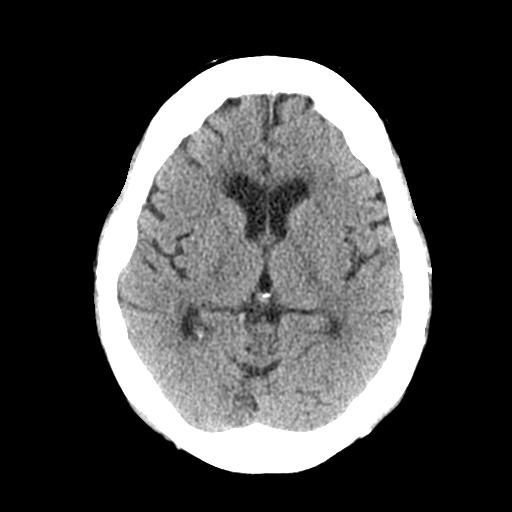
[im 15/28  brain]
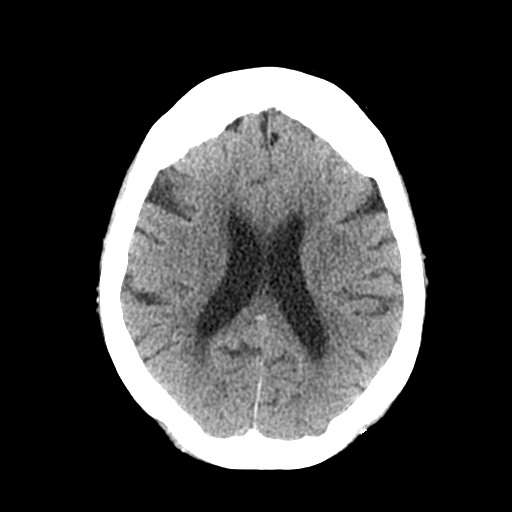
[im 15/28  bone]
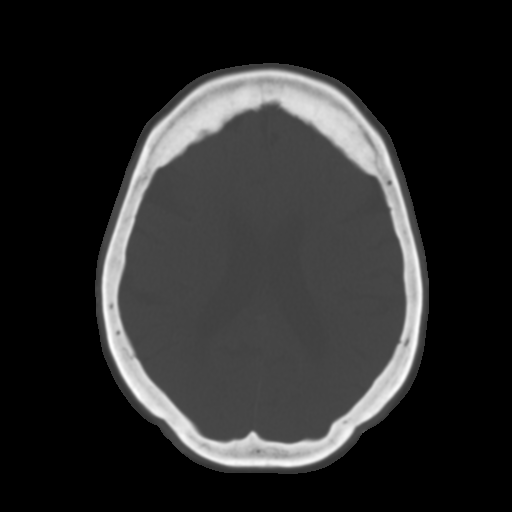
[im 17/28  brain]
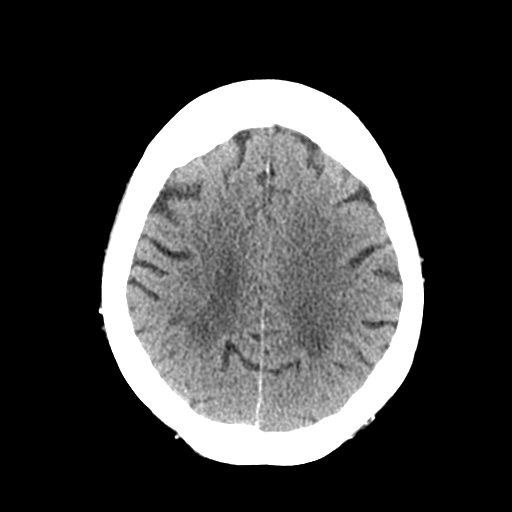
[im 20/28  brain]
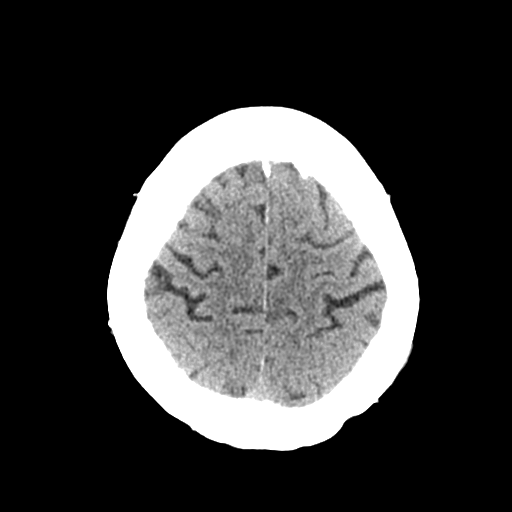
[im 23/28  brain]
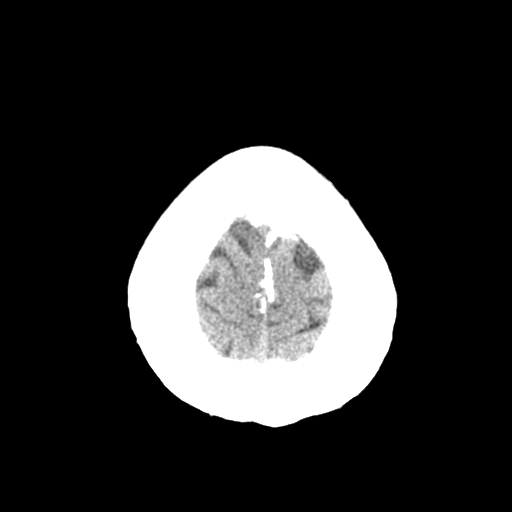
[im 26/28  brain]
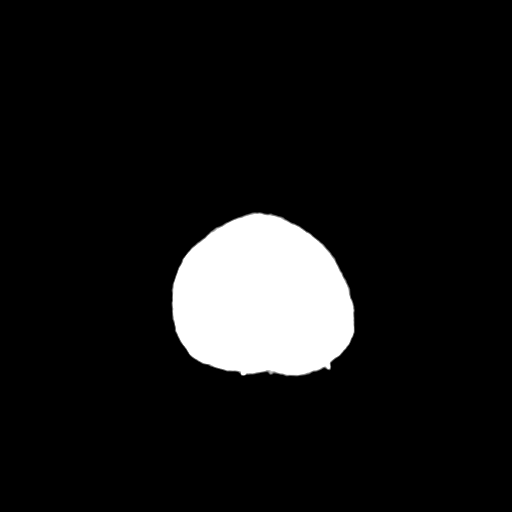
[im 26/28  bone]
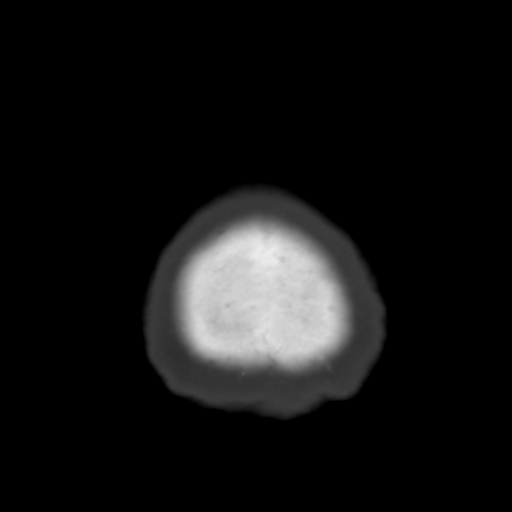

[Series 4: coronal soft tissue · coronal · 0.30mm/px · 3 of 64 slices shown]
[im 22/64  brain]
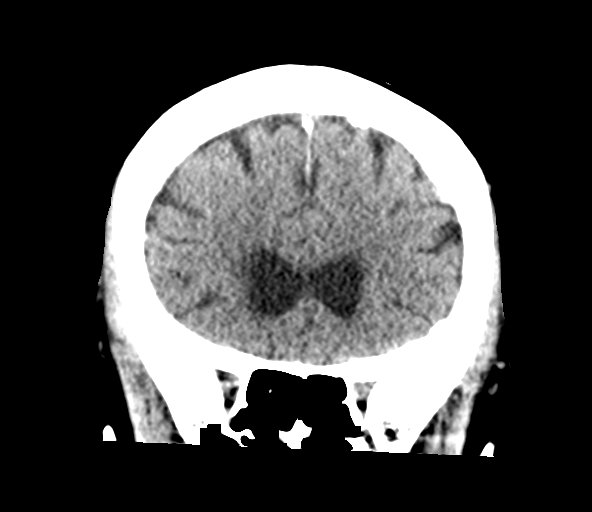
[im 29/64  brain]
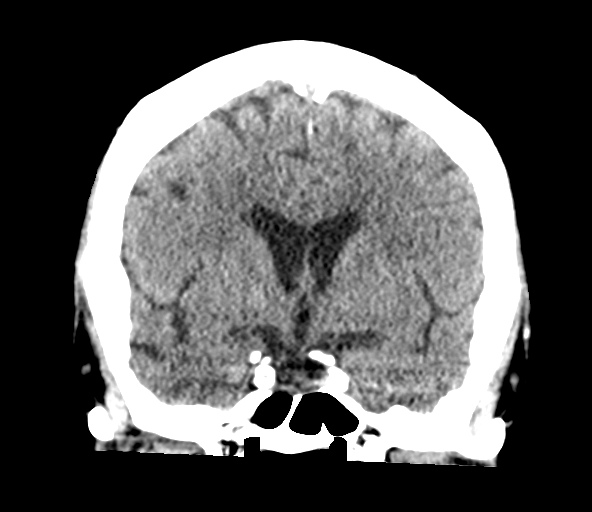
[im 36/64  brain]
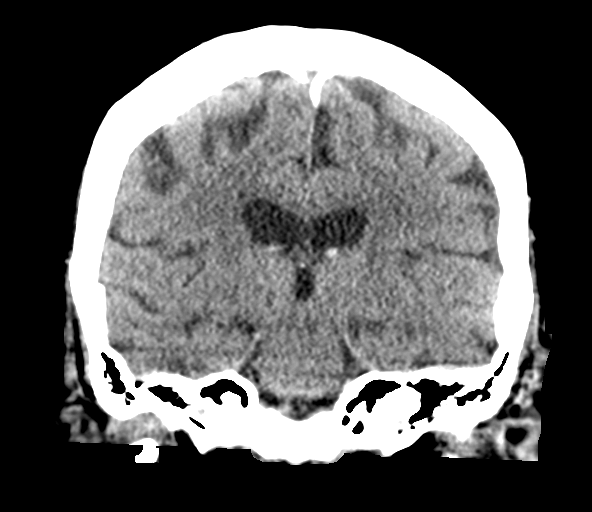

[Series 5: sagittal soft tissue · sagittal · 0.32mm/px · 3 of 51 slices shown]
[im 17/51  brain]
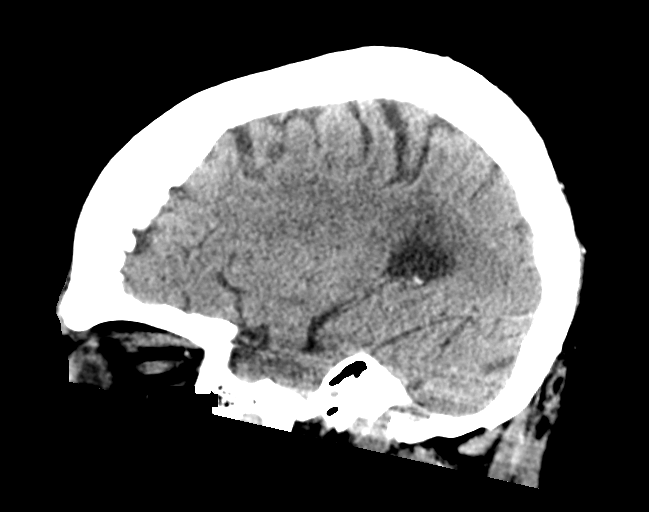
[im 26/51  brain]
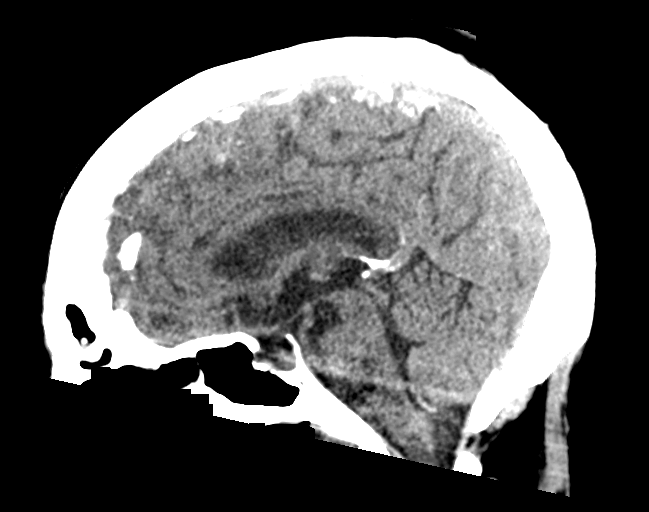
[im 34/51  brain]
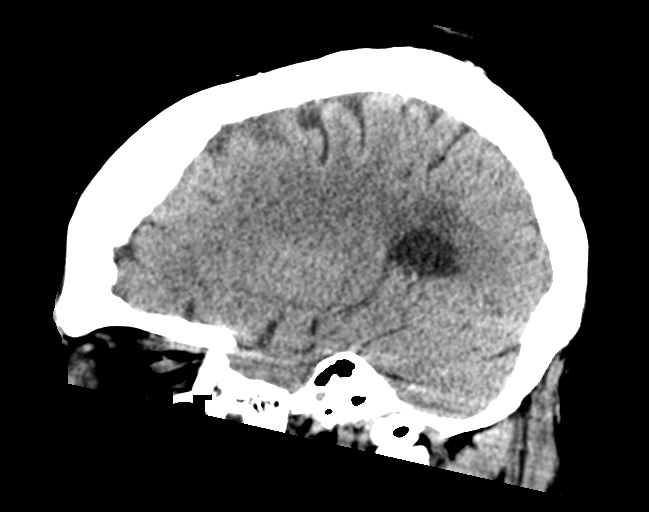

[15 of 45 positions shown; findings below may reference images not displayed]

FINDINGS: Brain: No evidence of acute infarction, hemorrhage, hydrocephalus,
extra-axial collection or mass lesion/mass effect.

Vascular: No hyperdense vessel or unexpected calcification.

Skull: Normal. Negative for fracture or focal lesion.

Sinuses/Orbits: No acute finding. The patient is status post
bilateral cataract surgery.

Other: None.
IMPRESSION: No acute intracranial abnormality detected.

## 2020-09-09 NOTE — ED Notes (Signed)
Stacy Conger, NP sent message notifying him of potassium of 6.3. VSS. Awaiting further orders. Will continue to monitor.

## 2020-09-09 NOTE — Consult Note (Addendum)
[facility] Nurse Consult Note: Reason for Consult: Consult requested for sacrum and toes.  Pt is critically ill with multiple systemic factors which can impair healing and was noted to have a Deep tissue pressure injury to the sacrum which was present on admission, according to the nursing flowsheet. Area is approx 3X3cm, darker colored skin surrounding a Stage 2 pressure injury which has evolved in the center; 1X1X.1cm, pink and dry Pressure Injury POA: Yes Left 3rd and 4th toes with black dry hard eschar; no topical treatment is indicated.  Right tip of great toe with full thickness wound; 1X1X.1cm, red and dry 5th toe posterior area with full thickness wound; .3X.3X.1cm, red and dry Dressing procedure/placement/frequency: Pt is on a low airloss mattress to reduce pressure. Topical treatment orders provided for bedside nurses to perform as follows to protect from further injury: Foam dressing to sacrum; change Q 3 days or PRN soiling. Bactroban BID to right toes to promote moist healing. Please re-consult if further assistance is needed.  Thank-you,  Julien Girt MSN, [place], [place], [place], [place]

## 2020-09-09 NOTE — Plan of Care (Signed)
Pt admitted from ED this am after coding in triage area twice. Initially stable on levophed this am, however over the day pressor requirement increased. Propofol turned off this morning, pt minimally responsive with gross movement of left upper arm only and brief eye opening/fluttering to pain. Daughter at bedside, understands that pt is in dying process and began to say goodbyes in the early afternoon. Goal was to sustain patient until additional family could arrive tomorrow, however pt asystole today at 1611 despite 3 pressors at max rate. Daughter declined visit by chaplain and said she was glad her mom could go peacefully. Belongings sent home with daughter.   Problem: Coping: Goal: Level of anxiety will decrease 08/25/20 1705 by Juel Burrow, RN Outcome: Progressing August 25, 2020 1705 by Juel Burrow, RN Outcome: Not Progressing   Problem: Pain Managment: Goal: General experience of comfort will improve 08/25/2020 1705 by Juel Burrow, RN Outcome: Progressing Aug 25, 2020 1705 by Juel Burrow, RN Outcome: Not Progressing   Problem: Clinical Measurements: Goal: Ability to maintain clinical measurements within normal limits will improve 2020/08/25 1705 by Juel Burrow, RN Outcome: Not Progressing 2020/08/25 1705 by Juel Burrow, RN Outcome: Not Progressing Goal: Diagnostic test results will improve 25-Aug-2020 1705 by Juel Burrow, RN Outcome: Not Progressing 08/25/20 1705 by Juel Burrow, RN Outcome: Not Progressing Goal: Respiratory complications will improve 08-25-20 1705 by Juel Burrow, RN Outcome: Not Progressing August 25, 2020 1705 by Juel Burrow, RN Outcome: Not Progressing Goal: Cardiovascular complication will be avoided 08-25-20 1705 by Juel Burrow, RN Outcome: Not Progressing Aug 25, 2020 1705 by Juel Burrow, RN Outcome: Not Progressing   Problem: Activity: Goal: Risk for activity  intolerance will decrease 2020/08/25 1705 by Juel Burrow, RN Outcome: Not Progressing 2020/08/25 1705 by Juel Burrow, RN Outcome: Not Progressing

## 2020-09-09 NOTE — Progress Notes (Signed)
Pt had bradycardia with long pauses and hypotension. Atropine x 1, bicarb amp x 3 , levo rate increased to 100 to perfuse pt sufficiently. Interventions effective, levo now back to rate in 30s. Pt now DNR. Requested phenylephrine bag from pharmacy as well to have ready at bedside. Dr.Kasa at bedside for this event.

## 2020-09-09 NOTE — Progress Notes (Signed)
[facility]  ROUNDING NOTE   Subjective:    Patient was brought in by EMS for altered mental status.  She has not been going to outpatient dialysis because of leg weakness and because of weather emergency and snow. Patient underwent emergency HD last night for high potassium. Level is still elevated this morning. Daughter at bedside  Sedated with fentanyl Vent assisted. Fio2 40% Regular rhythm. NSTEMI. Heparin drip No TF at present HD done yesterday with 3 pressors    Objective:  Vital signs in last 24 hours:  Temp:  [93.7 F (34.3 C)-98.7 F (37.1 C)] 96.3 F (35.7 C) (02/01 0913) Pulse Rate:  [25-125] 51 (02/01 0913) Resp:  [9-39] 27 (02/01 0913) BP: (57-222)/(35-154) 123/52 (02/01 0913) SpO2:  [84 %-100 %] 100 % (02/01 0913) FiO2 (%):  [60 %-100 %] 60 % (02/01 0913) Weight:  [64.4 kg] 64.4 kg (02/01 0913)  Weight change:  Filed Weights   2020/09/05 0913  Weight: 64.4 kg    Intake/Output: I/O last 3 completed shifts: In: 100 [IV Piggyback:100] Out: 0    Intake/Output this shift:  No intake/output data recorded.  Physical Exam: General:  Critically ill-appearing  Head:  ET tube in place, OG tube in place  Lungs:   Ventilator assisted  Heart:  Iregular  Abdomen:   Soft, no gurading  Extremities:  Trace edema  Neurologic:  Sedated  Skin:  Warm, dry  Access:  Right arm AV graft, bruit present    Basic Metabolic Panel: Recent Labs  Lab 07/15/2020 2129 2020/09/05 0017 Sep 05, 2020 0346  NA 150*  --  142  K >7.5*  --  6.3*  CL 97*  --  92*  CO2 20*  --  20*  GLUCOSE 159*  --  28*  BUN 142*  --  78*  CREATININE 20.60*  --  12.54*  CALCIUM 14.2*  --  10.5*  PHOS 10.4* 7.7*  --     Liver Function Tests: Recent Labs  Lab 07/31/2020 2129 09-05-20 0346  AST 806* 2,275*  ALT 213* 522*  ALKPHOS 168* 377*  BILITOT 1.8* 2.2*  PROT 6.7 6.6  ALBUMIN 2.0* 1.9*   No results for input(s): LIPASE, AMYLASE in the last 168 hours. No results for input(s):  AMMONIA in the last 168 hours.  CBC: Recent Labs  Lab 07/31/2020 1928 2020-09-05 0346  WBC 15.0* 23.9*  NEUTROABS 11.7*  --   HGB 9.4* 10.8*  HCT 30.2* 34.1*  MCV 95.9 95.3  PLT 580* 518*    Cardiac Enzymes: No results for input(s): CKTOTAL, CKMB, CKMBINDEX, TROPONINI in the last 168 hours.  BNP: Invalid input(s): POCBNP  CBG: Recent Labs  Lab 07/16/2020 1901 07/19/2020 1932 September 05, 2020 0518 Sep 05, 2020 0609 2020/09/05 0730  GLUCAP 92 115* 100* 107* 152*    Microbiology: Results for orders placed or performed during the hospital encounter of 07/31/2020  Blood culture (routine x 2)     Status: None (Preliminary result)   Collection Time: 08/07/2020  7:50 PM   Specimen: BLOOD  Result Value Ref Range Status   Specimen Description BLOOD BLOOD LEFT WRIST  Final   Special Requests   Final    BOTTLES DRAWN AEROBIC AND ANAEROBIC Blood Culture adequate volume   Culture   Final    NO GROWTH < 12 HOURS Performed at [facility], [address] [place], [place] [address]    Report Status PENDING  Incomplete  Blood culture (routine x 2)     Status: None (Preliminary  result)   Collection Time: 08/02/2020  7:55 PM   Specimen: BLOOD  Result Value Ref Range Status   Specimen Description BLOOD BLOOD RIGHT FOREARM  Final   Special Requests   Final    BOTTLES DRAWN AEROBIC AND ANAEROBIC Blood Culture adequate volume   Culture   Final    NO GROWTH < 12 HOURS Performed at [facility], [address] [place], [place] [address]    Report Status PENDING  Incomplete    Coagulation Studies: Recent Labs    07/12/2020 10-08-2003  LABPROT 24.7*  INR 2.3*    Urinalysis: No results for input(s): COLORURINE, LABSPEC, PHURINE, GLUCOSEU, HGBUR, BILIRUBINUR, KETONESUR, PROTEINUR, UROBILINOGEN, NITRITE, LEUKOCYTESUR in the last 72 hours.  Invalid input(s): APPERANCEUR    Imaging: CT Head Wo Contrast  Result Date: Aug 22, 2020 CLINICAL DATA:  Mental status change . EXAM: CT HEAD WITHOUT  CONTRAST TECHNIQUE: Contiguous axial images were obtained from the base of the skull through the vertex without intravenous contrast. COMPARISON:  Chest x-ray 12/15/2018 FINDINGS: Brain: Patchy and confluent areas of decreased attenuation are noted throughout the deep and periventricular white matter of the cerebral hemispheres bilaterally, compatible with chronic microvascular ischemic disease. Nonspecific mineralization of bilateral basal ganglia. No evidence of large-territorial acute infarction. No parenchymal hemorrhage. No mass lesion. No extra-axial collection. No mass effect or midline shift. No hydrocephalus. Basilar cisterns are patent. Vascular: No hyperdense vessel. Atherosclerotic calcifications are present within the cavernous internal carotid and vertebral arteries. Skull: No acute fracture or focal lesion. Sinuses/Orbits: Bilateral maxillary, sphenoid, ethmoid sinus mucosal thickening. Paranasal sinuses and mastoid air cells are clear. Bilateral lens replacement. Otherwise the orbits are unremarkable. Other: None. IMPRESSION: 1. No acute intracranial abnormality. 2. Bilateral maxillary, sphenoid, ethmoid sinus mucosal thickening. Correlate with signs and symptoms of acute sinusitis. Electronically Signed   By: Iven Finn M.D.   On: 08-22-20 05:04   DG Chest Port 1 View  Result Date: 08/09/2020 CLINICAL DATA:  Check endotracheal tube placement EXAM: PORTABLE CHEST 1 VIEW COMPARISON:  07/31/2020 FINDINGS: Endotracheal tube and gastric catheter are noted. The proximal side port of the gastric catheter is noted at the gastroesophageal junction. This could be advanced deeper into the stomach. Cardiac shadow is enlarged. Aortic calcifications are seen. Lungs are well aerated bilaterally. Patchy airspace opacity is noted right greater than left consistent with the known history of prior COVID-19 positivity. This has increased slightly in the interval from the prior exam. IMPRESSION: Tubes and  lines as described above. Increasing airspace opacity consistent with the given clinical history of COVID-19 positivity. Gastric catheter could be advanced deeper into the stomach. Electronically Signed   By: Inez Catalina M.D.   On: 07/31/2020 20:11     Medications:   . sodium chloride    . famotidine (PEPCID) IV    . heparin 800 Units/hr (08-22-2020 0550)  . norepinephrine (LEVOPHED) Adult infusion 20 mcg/min (08-22-2020 0553)  . phenylephrine (NEO-SYNEPHRINE) Adult infusion Stopped (August 22, 2020 0058)  . propofol (DIPRIVAN) infusion 10 mcg/kg/min (2020-08-22 0500)  . sodium bicarbonate (isotonic) 150 mEq in D5W 1000 mL infusion 75 mL/hr at 07/14/2020 1951  . vasopressin Stopped (08-22-20 0103)   . Chlorhexidine Gluconate Cloth  6 each Topical Q0600  . docusate  100 mg Per Tube BID  . patiromer  25.2 g Oral Daily  . polyethylene glycol  17 g Per Tube Daily  . sodium chloride flush  3 mL Intravenous Q12H   sodium chloride, acetaminophen, docusate sodium, fentaNYL (SUBLIMAZE) injection, fentaNYL (  SUBLIMAZE) injection, midazolam, midazolam, ondansetron (ZOFRAN) IV, polyethylene glycol, sodium chloride flush  Assessment/ Plan:   Stacy Pham is a 87 y.o. black ([place]) female  with ESRD on HD, paroxysmal atrial fibrillation, peripheral vascular disease, diabetes mellitus type 2, pretension, hyperlipidemia, anemia of chronic kidney disease, secondary hyperparathyroidism who was admitted on January 31  1.  ESRD on HD TTS at [place] unit.  Last dialysis was about a week ago on 08/01/2020 Emergent HD last night for severe hyperkalemia affecting cardic rhythm Plan for HD today for hyperkalemia  2.  Anemia of chronic kidney disease.   Lab Results  Component Value Date   HGB 10.8 (L) Aug 31, 2020  EPO for Hgb < 10  3.  Secondary hyperparathyroidism.   Lab Results  Component Value Date   CALCIUM 10.5 (H) 08/31/20   CAION 1.03 (L) 01/09/2020   PHOS 7.7 (H) 08-31-2020  Monitor  calcium phosphorus during this admission  4.  COVID-19 pneumonia.   Diagnosed July 21, 2020 Off precautions  5.  Acute respiratory failure Intubated in the emergency room 08/04/2020 Currently ventilator assisted    LOS: [place] 02-20-20229:30 AM

## 2020-09-09 NOTE — Progress Notes (Signed)
ANTICOAGULATION CONSULT NOTE  Pharmacy Consult for Heparin  Indication: chest pain/ACS  No Known Allergies  Patient Measurements: Height: '5\' 3"'$  (160 cm) Weight: 64.4 kg (141 lb 15.6 oz) IBW/kg (Calculated) : 52.4 Heparin Dosing Weight: 66.6 kg   Vital Signs: Temp: 95.9 F (35.5 C) (02/01 1400) Temp Source: Bladder (02/01 1200) BP: 76/42 (02/01 1400) Pulse Rate: 54 (02/01 1400)  Labs: Recent Labs    07/22/2020 1928 07/19/2020 2005 07/22/2020 2129 09/01/2020 0017 Sep 01, 2020 0346 09/01/20 0941 01-Sep-2020 1211 2020-09-01 1344  HGB 9.4*  --   --   --  10.8*  --   --   --   HCT 30.2*  --   --   --  34.1*  --   --   --   PLT 580*  --   --   --  518*  --   --   --   APTT  --  35  --   --   --   --   --   --   LABPROT  --  24.7*  --   --   --   --   --   --   INR  --  2.3*  --   --   --   --   --   --   HEPARINUNFRC  --   --   --   --   --   --   --  1.60*  CREATININE  --   --  20.60*  --  12.54* 13.49*  --   --   TROPONINIHS  --   --  464*   < > 1,697* 3,033* 3,254*  --    < > = values in this interval not displayed.    Estimated Creatinine Clearance: 2.7 mL/min (A) (by C-G formula based on SCr of 13.49 mg/dL (H)).   Medical History: Past Medical History:  Diagnosis Date  . (HFpEF) heart failure with preserved ejection fraction ([place])    a. 10/2017 Echo: EF 60-65%, Gr1 DD, mild MR, mildly dil LA/RA. PASP 13mHg.  . Arthritis   . Back pain   . Bronchitis   . Cataract   . Diabetes mellitus without complication (HCC)    no meds currently  . [place]   . Dysrhythmia   . [place] disease) [place]    a. 2018 - initially on HD but then transitioned to nightly PD in 08/2017.  .Marland KitchenGout   . History of methicillin resistant staphylococcus aureus (MRSA)   . Hypertension   . PAF (paroxysmal atrial fibrillation) (HCC)    a. CHA2DS2VASc = 5-->eliquis 2.5 BID.    Assessment: 87year old female presented following missed dialysis sessions. Cardiac arrest in the ED  requiring CPR and defibrillations with ROSC. Pharmacy consult for heparin drip for NSTEMI. Patient previously on Eliquis PTA, last dose unknown.  Goal of Therapy:  Heparin level 0.3-0.7 units/ml  APTT 66-102 s Monitor platelets by anticoagulation protocol: Yes   Plan:  2/1 1344 HL 1.60 supratherapeutic. Expect HL falsely elevated s/t PTA Eliquis use. Will add on aPTT. Continue heparin drip at 800 units/hr for now until aPTT comes back. CBC and HL with morning labs.    ATawnya Crook PharmD 202/21/223:12 PM

## 2020-09-09 NOTE — ED Notes (Addendum)
Pt SB on monitor. Repeat EKG taken. Dr Mortimer Fries made aware, no new orders at this time

## 2020-09-09 NOTE — ED Notes (Signed)
Stacy Conger, NP sent secure message notifying him of Bicarb of 15.1 on ABG. VSS. Will continue to monitor.

## 2020-09-09 NOTE — Consult Note (Signed)
[facility] Consultation Note  Patient ID: Stacy Pham, MRN: [id number], DOB/AGE: Nov 09, 1930 87 y.o. Admit date: 07/18/2020   Date of Consult: 22-Aug-2020 Primary Physician: Elby Beck, FNP (Inactive) Primary Cardiologist: None  Chief Complaint: No chief complaint on file.  Reason for Consult: Cardiac arrest  HPI: 87 y.o. female with known heart failure with preserved ejection fraction last echocardiogram having ejection fraction of 60% with diabetes hypertension hyperlipidemia paroxysmal nonvalvular atrial fibrillation and end-stage renal dialysis.  The patient had been doing relatively well with appropriate medication management on board for all above diagnoses when she missed dialysis and had a significant extreme electrolyte abnormalities.  Her potassium level was greater than 7.5.  She also was volume overloaded at the time.  She came in cardiac arrest to the emergency room for which ACLS and protocol were performed.  The patient had an EKG is consistent with sine wave and had no pulse and CPR was performed for quite a long time.  The patient has had some hemodynamic stability since intubation and pressure support.  Chest x-ray has shown possible Covid pneumonia with possible pulmonary edema.  Hemoglobin is 10.8 glomerular filtration rate is 3.  Troponin level is 464/144/1647.  This is most consistent with current cardiac arrest and/or non-ST elevation myocardial infarction and no current signs of ST elevation myocardial infarction.  Studies will show the patient is medically managed with deferred intervention from the cardiovascular standpoint for non-ST elevation myocardial infarction had similar outcomes.  The patient now has telemetry showing sinus bradycardia with nonspecific ST changes.  The patient also has significant cardiac murmur of unknown etiology for which may need further evaluation.  Past Medical History:  Diagnosis Date  . (HFpEF) heart failure with  preserved ejection fraction ([place])    a. 10/2017 Echo: EF 60-65%, Gr1 DD, mild MR, mildly dil LA/RA. PASP 76mHg.  . Arthritis   . Back pain   . Bronchitis   . Cataract   . Diabetes mellitus without complication (HCC)    no meds currently  . [place]   . Dysrhythmia   . [place] ([place] renal disease) [place]    a. 2018 - initially on HD but then transitioned to nightly PD in 08/2017.  .Marland KitchenGout   . History of methicillin resistant staphylococcus aureus (MRSA)   . Hypertension   . PAF (paroxysmal atrial fibrillation) (HCC)    a. CHA2DS2VASc = 5-->eliquis 2.5 BID.      Surgical History:  Past Surgical History:  Procedure Laterality Date  . A/V FISTULAGRAM Right 11/13/2019   Procedure: A/V FISTULAGRAM;  Surgeon: SKatha Cabal MD;  [facility];  Service: Cardiovascular;  Laterality: Right;  . AMPUTATION Left 01/09/2020   Procedure: AMPUTATION RAY ( GREAT TOE );  Surgeon: SKatha Cabal MD;  Location: [facility];  Service: Vascular;  Laterality: Left;  . AV FISTULA PLACEMENT Right 09/13/2018   Procedure: INSERTION OF ARTERIOVENOUS (AV) GORE-TEX GRAFT ARM;  Surgeon: SKatha Cabal MD;  Location: [facility];  Service: Vascular;  Laterality: Right;  . CAPD INSERTION N/A 06/08/2017   Procedure: LAPAROSCOPIC INSERTION CONTINUOUS AMBULATORY PERITONEAL DIALYSIS  (CAPD) CATHETER;  Surgeon: SKatha Cabal MD;  Location: [facility];  Service: Vascular;  Laterality: N/A;  . CATARACT EXTRACTION, BILATERAL Bilateral   . DIALYSIS/PERMA CATHETER INSERTION N/A 03/18/2017   Procedure: DIALYSIS/PERMA CATHETER INSERTION;  Surgeon: SKatha Cabal MD;  [facility];  Service: Cardiovascular;  Laterality: N/A;  .  DIALYSIS/PERMA CATHETER REMOVAL N/A 10/20/2017   Procedure: DIALYSIS/PERMA CATHETER REMOVAL;  Surgeon: Algernon Huxley, MD;  Location: [place] [facility];  Service: Cardiovascular;  Laterality: N/A;  . EYE SURGERY    . LOWER EXTREMITY  ANGIOGRAPHY Left 09/11/2019   Procedure: LOWER EXTREMITY ANGIOGRAPHY;  Surgeon: Katha Cabal, MD;  Location: [place] [facility];  Service: Cardiovascular;  Laterality: Left;  . LOWER EXTREMITY ANGIOGRAPHY Right 10/17/2019   Procedure: LOWER EXTREMITY ANGIOGRAPHY;  Surgeon: Katha Cabal, MD;  Location: [place] [facility];  Service: Cardiovascular;  Laterality: Right;  . LOWER EXTREMITY ANGIOGRAPHY Left 11/20/2019   Procedure: LOWER EXTREMITY ANGIOGRAPHY;  Surgeon: Katha Cabal, MD;  Location: [place] [facility];  Service: Cardiovascular;  Laterality: Left;  . PERIPHERAL VASCULAR THROMBECTOMY Right 03/12/2020   Procedure: PERIPHERAL VASCULAR THROMBECTOMY;  Surgeon: Katha Cabal, MD;  Location: [place] [facility];  Service: Cardiovascular;  Laterality: Right;  . REMOVAL OF A DIALYSIS CATHETER N/A 02/23/2019   Procedure: REMOVAL OF A DIALYSIS CATHETER ( PD CATH);  Surgeon: Katha Cabal, MD;  Location: [facility];  Service: Vascular;  Laterality: N/A;  . TEMPORARY DIALYSIS CATHETER  03/11/2020   Procedure: TEMPORARY DIALYSIS CATHETER;  Surgeon: Katha Cabal, MD;  Location: [place] [facility];  Service: Cardiovascular;;     Home Meds: Prior to Admission medications   Medication Sig Start Date End Date Taking? Authorizing Provider  acetaminophen (TYLENOL) 650 MG CR tablet Take 650-1,300 mg by mouth every 8 (eight) hours as needed for pain.    [provider]  allopurinol (ZYLOPRIM) 100 MG tablet TAKE 1 TABLET BY MOUTH EVERYDAY AT BEDTIME Patient taking differently: Take 100 mg by mouth at bedtime. 09/12/19   Elby Beck, FNP  amiodarone (PACERONE) 200 MG tablet Take 1 tablet (200 mg total) by mouth every morning. Take 1 tablet (200 mg) by mouth once daily Patient taking differently: Take 200 mg by mouth every morning. 10/24/19   Theora Gianotti, NP  amLODipine (NORVASC) 5 MG tablet TAKE 1 TABLET BY MOUTH EVERY DAY Patient taking differently:  Take 5 mg by mouth daily. 05/12/20   Elby Beck, FNP  atorvastatin (LIPITOR) 20 MG tablet TAKE 1 TABLET BY MOUTH EVERY DAY IN THE EVENING Patient taking differently: Take 20 mg by mouth every evening. 07/03/20   Elby Beck, FNP  benzonatate (TESSALON) 100 MG capsule Take 100 mg by mouth 3 (three) times daily as needed for cough.    [provider]  clopidogrel (PLAVIX) 75 MG tablet TAKE 1 TABLET BY MOUTH EVERY DAY Patient taking differently: Take 75 mg by mouth daily. 05/09/20   Schnier, Dolores Lory, MD  colchicine 0.6 MG tablet Take 0.6 mg by mouth daily as needed (gout).    [provider]  ELIQUIS 2.5 MG TABS tablet TAKE 1 TABLET BY MOUTH TWICE A DAY Patient taking differently: Take 2.5 mg by mouth 2 (two) times daily. 06/26/20   Elby Beck, FNP  ferric citrate (AURYXIA) 1 GM 210 MG(Fe) tablet Take 210 mg by mouth 3 (three) times daily with meals.    [provider]  HYDROcodone-acetaminophen (NORCO) 5-325 MG tablet Take 1 tablet by mouth every 6 (six) hours as needed for moderate pain or severe pain. 08/04/20   Kris Hartmann, NP  metoprolol tartrate (LOPRESSOR) 25 MG tablet Take 0.5 tablets (12.5 mg total) by mouth 2 (two) times daily. 06/23/20   Schnier, Dolores Lory, MD    Inpatient  Medications:  . albuterol  10 mg Nebulization Once  . Chlorhexidine Gluconate Cloth  6 each Topical Q0600  . docusate  100 mg Per Tube BID  . patiromer  25.2 g Oral Daily  . polyethylene glycol  17 g Per Tube Daily  . sodium chloride flush  3 mL Intravenous Q12H   . sodium chloride    . famotidine (PEPCID) IV    . heparin 800 Units/hr (August 24, 2020 0550)  . norepinephrine (LEVOPHED) Adult infusion 20 mcg/min (24-Aug-2020 0553)  . phenylephrine (NEO-SYNEPHRINE) Adult infusion Stopped (24-Aug-2020 0058)  . propofol (DIPRIVAN) infusion 10 mcg/kg/min (2020/08/24 0500)  . sodium bicarbonate (isotonic) 150 mEq in D5W 1000 mL infusion 75 mL/hr at 08/01/2020 1951  . vasopressin  Stopped (08/24/2020 0103)    Allergies: No Known Allergies  Social History   Socioeconomic History  . Marital status: Single    Spouse name: Not on file  . Number of children: 3  . Years of education: Not on file  . Highest education level: Not on file  Occupational History  . Not on file  Tobacco Use  . Smoking status: Never Smoker  . Smokeless tobacco: Never Used  Vaping Use  . Vaping Use: Never used  Substance and Sexual Activity  . Alcohol use: No  . Drug use: No  . Sexual activity: Not on file  Other Topics Concern  . Not on file  Social History Narrative   Retired. Lives in [place] with her dtr who takes care of her.  Pt uses a walker to get around, though admits that ambulation over all is limited.   Social Determinants of Health   Financial Resource Strain: Low Risk   . Difficulty of Paying Living Expenses: Not hard at all  Food Insecurity: No Food Insecurity  . Worried About Charity fundraiser in the Last Year: Never true  . Ran Out of Food in the Last Year: Never true  Transportation Needs: No Transportation Needs  . Lack of Transportation (Medical): No  . Lack of Transportation (Non-Medical): No  Physical Activity: Inactive  . Days of Exercise per Week: 0 days  . Minutes of Exercise per Session: 0 min  Stress: No Stress Concern Present  . Feeling of Stress : Not at all  Social Connections: Not on file  Intimate Partner Violence: Not At Risk  . Fear of Current or Ex-Partner: No  . Emotionally Abused: No  . Physically Abused: No  . Sexually Abused: No     Family History  Problem Relation Age of Onset  . Hypertension Father      Review of Systems Cannot assess  Labs: No results for input(s): CKTOTAL, CKMB, TROPONINI in the last 72 hours. Lab Results  Component Value Date   WBC 23.9 (H) 2020-08-24   HGB 10.8 (L) 08/24/20   HCT 34.1 (L) August 24, 2020   MCV 95.3 Aug 24, 2020   PLT 518 (H) 08-24-2020    Recent Labs  Lab 08-24-20 0346  NA 142   K 6.3*  CL 92*  CO2 20*  BUN 78*  CREATININE 12.54*  CALCIUM 10.5*  PROT 6.6  BILITOT 2.2*  ALKPHOS 377*  ALT 522*  AST 2,275*  GLUCOSE 28*   Lab Results  Component Value Date   CHOL 138 05/23/2020   HDL 46.30 05/23/2020   LDLCALC 78 05/23/2020   TRIG 179 (H) 08-24-20   No results found for: DDIMER  Radiology/Studies:  DG Chest 1 View  Result Date: 07/31/2020 CLINICAL DATA:  Hypoxia,  history of recent COVID-19 positivity EXAM: CHEST  1 VIEW COMPARISON:  03/11/2020 FINDINGS: Cardiac shadow is enlarged but stable. Aortic calcifications are seen. Patchy airspace disease is noted bilaterally consistent with the given clinical history. No sizable effusion is seen. No bony abnormality is noted. IMPRESSION: Patchy airspace opacity consistent with the given clinical history of COVID-19 positivity. Electronically Signed   By: Inez Catalina M.D.   On: 07/31/2020 19:32   CT Head Wo Contrast  Result Date: 08/25/20 CLINICAL DATA:  Mental status change . EXAM: CT HEAD WITHOUT CONTRAST TECHNIQUE: Contiguous axial images were obtained from the base of the skull through the vertex without intravenous contrast. COMPARISON:  Chest x-ray 12/15/2018 FINDINGS: Brain: Patchy and confluent areas of decreased attenuation are noted throughout the deep and periventricular white matter of the cerebral hemispheres bilaterally, compatible with chronic microvascular ischemic disease. Nonspecific mineralization of bilateral basal ganglia. No evidence of large-territorial acute infarction. No parenchymal hemorrhage. No mass lesion. No extra-axial collection. No mass effect or midline shift. No hydrocephalus. Basilar cisterns are patent. Vascular: No hyperdense vessel. Atherosclerotic calcifications are present within the cavernous internal carotid and vertebral arteries. Skull: No acute fracture or focal lesion. Sinuses/Orbits: Bilateral maxillary, sphenoid, ethmoid sinus mucosal thickening. Paranasal sinuses and  mastoid air cells are clear. Bilateral lens replacement. Otherwise the orbits are unremarkable. Other: None. IMPRESSION: 1. No acute intracranial abnormality. 2. Bilateral maxillary, sphenoid, ethmoid sinus mucosal thickening. Correlate with signs and symptoms of acute sinusitis. Electronically Signed   By: Iven Finn M.D.   On: 2020/08/25 05:04   DG Chest Port 1 View  Result Date: 07/18/2020 CLINICAL DATA:  Check endotracheal tube placement EXAM: PORTABLE CHEST 1 VIEW COMPARISON:  07/31/2020 FINDINGS: Endotracheal tube and gastric catheter are noted. The proximal side port of the gastric catheter is noted at the gastroesophageal junction. This could be advanced deeper into the stomach. Cardiac shadow is enlarged. Aortic calcifications are seen. Lungs are well aerated bilaterally. Patchy airspace opacity is noted right greater than left consistent with the known history of prior COVID-19 positivity. This has increased slightly in the interval from the prior exam. IMPRESSION: Tubes and lines as described above. Increasing airspace opacity consistent with the given clinical history of COVID-19 positivity. Gastric catheter could be advanced deeper into the stomach. Electronically Signed   By: Inez Catalina M.D.   On: 08/01/2020 20:11    EKG: Sinus bradycardia with nonspecific ST changes  Weights: There were no vitals filed for this visit.   Physical Exam: Blood pressure (!) 136/55, pulse (!) 52, temperature (!) 96.2 F (35.7 C), temperature source Core, resp. rate 20, SpO2 100 %. There is no height or weight on file to calculate BMI. General: Well developed, well nourished, in no acute distress. Head eyes ears nose throat: Normocephalic, atraumatic, sclera non-icteric, no xanthomas, nares are without discharge. No apparent thyromegaly and/or mass  Lungs: Normal respiratory effort.  Some wheezes, no rales, diffuse rhonchi.  Heart: Slow rate slightly irregular with normal S1 S2.  3-4+ left sternal  border murmur no gallop, no rub, PMI is normal size and placement, carotid upstroke normal without bruit, jugular venous pressure is normal Abdomen: Soft, distended with normoactive bowel sounds. No hepatomegaly. No rebound/guarding. No obvious abdominal masses. Abdominal aorta is normal size without bruit Extremities: Trace edema. no cyanosis, no clubbing, no ulcers  Peripheral : 2+ bilateral upper extremity pulses, 2+ bilateral femoral pulses, 2+ bilateral dorsal pedal pulse Neuro: Not alert and oriented. No facial asymmetry. No focal deficit.  Moves all extremities spontaneously. Musculoskeletal: Normal muscle tone without kyphosis Psych: Does not responds to questions appropriately with a normal affect.    Assessment: 87 year old female with diabetes hypertension hyperlipidemia paroxysmal nonvalvular atrial fibrillation anemia with Covid pneumonia and acute cardiorespiratory failure secondary to missing hemodialysis with extreme electrolyte abnormalities and pulseless electrical activity requiring ACLS and now intubated and resuscitated with elevated troponin consistent with non-ST elevation myocardial infarction from above  Plan: 1.  Continue supportive care of respiratory failure electrolyte abnormalities and fluid overload from missing dialysis as well as further treatment of Covid pneumonia 2.  Anticoagulation for further risk reduction side effects of Covid pneumonia as well as possible paroxysmal nonvalvular atrial fibrillation although would only use single antiplatelet therapy at this time due to concerns of bleeding complications 3.  Echocardiogram for LV systolic dysfunction valvular heart disease and murmur contributing to above with further adjustments of treatment as needed 4.  Patient has very poor prognosis with above issue  Signed, Corey Skains M.D. [facility] 09/09/20, 8:35 AM

## 2020-09-09 NOTE — Death Summary Note (Signed)
DEATH SUMMARY   Patient Details  Name: Stacy Pham MRN: [id number] DOB: Apr 04, 1931  Admission/Discharge Information   Admit Date:  09-01-20  Date of Death:   September 02, 2020   Time of Death:  16:11  Length of Stay: 1  Referring Physician: Elby Beck, FNP (Inactive)   Reason(s) for Hospitalization  Cardiac arrest  Diagnoses  Preliminary cause of death: Hyperkalemia and Ischemic Cardiomyopathy, anoxic brain damage Secondary Diagnoses (including complications and co-morbidities):  Active Problems:   Cardiac arrest [facility])   Brief Hospital Course (including significant findings, care, treatment, and services provided and events leading to death)  87 yo AAF with multiple medical issues missed HD for 1 week with recent dx of COVID 19 Infection with acute and sudden cardiac arrest  likely due to acute ischemic cardiomyopathy with severe lactic acidosis with high potassium  ACUTE SYSTOLIC CARDIAC FAILURE-ISCHEMIC CARDIOMYOPATHY Vent support PATIENT WITH PROLONGED CARDIAC ARREST WITH VERY UNFAVORABLE OUTCOME, PATIENT IS NOT A CANDIDATE FOR HYPOTHERMIA PROTOCOL  Severe ACUTE Hypoxic and Hypercapnic Respiratory Failure -continue Full MV support -continue Bronchodilator Therapy -Wean Fio2 and PEEP as tolerated -VAP/VENT bundle implementation   END STAGE KIDNEY INJURY/Renal Failure -continue Foley Catheter-assess need -Avoid nephrotoxic agents -Follow urine output, BMP -Ensure adequate renal perfusion, optimize oxygenation -Renal dose medications HD as needed   NEUROLOGY Acute toxic metabolic encephalopathy Assess NEURO STATUS  SHOCK-CARDIOGENIC -use vasopressors to keep MAP>65   CARDIAC ICU monitoring  GI GI PROPHYLAXIS as indicated  DIET-->NPO Constipation protocol as indicated  ENDO - will use ICU hypoglycemic\Hyperglycemia protocol if indicated    ELECTROLYTES -follow labs as needed -replace as needed -pharmacy consultation and  following    09-01-22 cardiac arrest, family updated 2/1 remains intubated, patient was made DNR after near cardiac arrest   GOALS OF CARE DISCUSSION  The Clinical status was relayed to family in detail.  Updated and notified of patients medical condition.  Patient remains unresponsive and will not open eyes to command.    Patient is having a weak cough and struggling to remove secretions.   Patient with increased WOB and using accessory muscles to breathe Explained to family course of therapy and the modalities     Patient with Progressive multiorgan failure with a very high probablity of a very minimal chance of meaningful recovery despite all aggressive and optimal medical therapy. Patient is in the Dying  Process associated with Suffering.  Family understands the situation.  They have consented and agreed to DNR  Family are satisfied with Plan of action and management. All questions answered  Patient died at 16:11     Pertinent Labs and Studies  Significant Diagnostic Studies DG Chest 1 View  Result Date: 07/31/2020 CLINICAL DATA:  Hypoxia, history of recent COVID-19 positivity EXAM: CHEST  1 VIEW COMPARISON:  03/11/2020 FINDINGS: Cardiac shadow is enlarged but stable. Aortic calcifications are seen. Patchy airspace disease is noted bilaterally consistent with the given clinical history. No sizable effusion is seen. No bony abnormality is noted. IMPRESSION: Patchy airspace opacity consistent with the given clinical history of COVID-19 positivity. Electronically Signed   By: Inez Catalina M.D.   On: 07/31/2020 19:32   CT Head Wo Contrast  Result Date: 09-02-2020 CLINICAL DATA:  Mental status change . EXAM: CT HEAD WITHOUT CONTRAST TECHNIQUE: Contiguous axial images were obtained from the base of the skull through the vertex without intravenous contrast. COMPARISON:  Chest x-ray 12/15/2018 FINDINGS: Brain: Patchy and confluent areas of decreased attenuation are noted throughout  the deep  and periventricular white matter of the cerebral hemispheres bilaterally, compatible with chronic microvascular ischemic disease. Nonspecific mineralization of bilateral basal ganglia. No evidence of large-territorial acute infarction. No parenchymal hemorrhage. No mass lesion. No extra-axial collection. No mass effect or midline shift. No hydrocephalus. Basilar cisterns are patent. Vascular: No hyperdense vessel. Atherosclerotic calcifications are present within the cavernous internal carotid and vertebral arteries. Skull: No acute fracture or focal lesion. Sinuses/Orbits: Bilateral maxillary, sphenoid, ethmoid sinus mucosal thickening. Paranasal sinuses and mastoid air cells are clear. Bilateral lens replacement. Otherwise the orbits are unremarkable. Other: None. IMPRESSION: 1. No acute intracranial abnormality. 2. Bilateral maxillary, sphenoid, ethmoid sinus mucosal thickening. Correlate with signs and symptoms of acute sinusitis. Electronically Signed   By: Iven Finn M.D.   On: 2020-09-11 05:04   DG Chest Port 1 View  Result Date: 07/26/2020 CLINICAL DATA:  Check endotracheal tube placement EXAM: PORTABLE CHEST 1 VIEW COMPARISON:  07/31/2020 FINDINGS: Endotracheal tube and gastric catheter are noted. The proximal side port of the gastric catheter is noted at the gastroesophageal junction. This could be advanced deeper into the stomach. Cardiac shadow is enlarged. Aortic calcifications are seen. Lungs are well aerated bilaterally. Patchy airspace opacity is noted right greater than left consistent with the known history of prior COVID-19 positivity. This has increased slightly in the interval from the prior exam. IMPRESSION: Tubes and lines as described above. Increasing airspace opacity consistent with the given clinical history of COVID-19 positivity. Gastric catheter could be advanced deeper into the stomach. Electronically Signed   By: Inez Catalina M.D.   On: 07/27/2020 20:11   DG Abd  Portable 1V  Result Date: 09/11/2020 CLINICAL DATA:  Orogastric tube placement. EXAM: PORTABLE ABDOMEN - 1 VIEW COMPARISON:  09/12/2017 FINDINGS: An orogastric tube is seen with tip overlying the gastric fundus. Small amount of contrast noted in the gastric fundus. Mildly dilated small bowel loops are noted in the mid abdomen, which may be due to mild ileus although a low-grade or partial small bowel obstruction cannot be excluded. Right femoral vein catheter and temperature probe in urinary bladder noted. IMPRESSION: Orogastric tube tip overlies the gastric fundus. Mildly dilated small bowel loops in the mid abdomen, which may be due to mild ileus although partial/low-grade small bowel obstruction cannot be excluded. Electronically Signed   By: Marlaine Hind M.D.   On: 09-11-2020 11:28    Microbiology Recent Results (from the past 240 hour(s))  Blood culture (routine x 2)     Status: None (Preliminary result)   Collection Time: 07/22/2020  7:50 PM   Specimen: BLOOD  Result Value Ref Range Status   Specimen Description BLOOD BLOOD LEFT WRIST  Final   Special Requests   Final    BOTTLES DRAWN AEROBIC AND ANAEROBIC Blood Culture adequate volume   Culture   Final    NO GROWTH < 12 HOURS Performed at [facility], [address], [place], [place] [address]    Report Status PENDING  Incomplete  Blood culture (routine x 2)     Status: None (Preliminary result)   Collection Time: 07/20/2020  7:55 PM   Specimen: BLOOD  Result Value Ref Range Status   Specimen Description BLOOD BLOOD RIGHT FOREARM  Final   Special Requests   Final    BOTTLES DRAWN AEROBIC AND ANAEROBIC Blood Culture adequate volume   Culture  Setup Time   Final    Organism ID to follow GRAM POSITIVE COCCI AEROBIC BOTTLE ONLY CRITICAL RESULT CALLED TO, READ  BACK BY AND VERIFIED WITH: Martin, [place] 06-Sep-2020 DB Performed at [facility], [address] [place], [place] [address]    Culture [facility] POSITIVE  COCCI  Final   Report Status PENDING  Incomplete  Blood Culture ID Panel (Reflexed)     Status: Abnormal   Collection Time: 07/21/2020  7:55 PM  Result Value Ref Range Status   Enterococcus faecalis NOT DETECTED NOT DETECTED Final   Enterococcus Faecium NOT DETECTED NOT DETECTED Final   Listeria monocytogenes NOT DETECTED NOT DETECTED Final   Staphylococcus species DETECTED (A) NOT DETECTED Final    Comment: CRITICAL RESULT CALLED TO, READ BACK BY AND VERIFIED WITH: Rolley Sims 1529 06-Sep-2020 DB    Staphylococcus aureus (BCID) NOT DETECTED NOT DETECTED Final   Staphylococcus epidermidis DETECTED (A) NOT DETECTED Final    Comment: Methicillin (oxacillin) resistant coagulase negative staphylococcus. Possible blood culture contaminant (unless isolated from more than one blood culture draw or clinical case suggests pathogenicity). No antibiotic treatment is indicated for blood  culture contaminants. CRITICAL RESULT CALLED TO, READ BACK BY AND VERIFIED WITH: Rolley Sims 1529 September 06, 2020 DB    Staphylococcus lugdunensis NOT DETECTED NOT DETECTED Final   Streptococcus species NOT DETECTED NOT DETECTED Final   Streptococcus agalactiae NOT DETECTED NOT DETECTED Final   Streptococcus pneumoniae NOT DETECTED NOT DETECTED Final   Streptococcus pyogenes NOT DETECTED NOT DETECTED Final   A.calcoaceticus-baumannii NOT DETECTED NOT DETECTED Final   Bacteroides fragilis NOT DETECTED NOT DETECTED Final   Enterobacterales NOT DETECTED NOT DETECTED Final   Enterobacter cloacae complex NOT DETECTED NOT DETECTED Final   Escherichia coli NOT DETECTED NOT DETECTED Final   Klebsiella aerogenes NOT DETECTED NOT DETECTED Final   Klebsiella oxytoca NOT DETECTED NOT DETECTED Final   Klebsiella pneumoniae NOT DETECTED NOT DETECTED Final   Proteus species NOT DETECTED NOT DETECTED Final   Salmonella species NOT DETECTED NOT DETECTED Final   Serratia marcescens NOT DETECTED NOT DETECTED Final   Haemophilus  influenzae NOT DETECTED NOT DETECTED Final   Neisseria meningitidis NOT DETECTED NOT DETECTED Final   Pseudomonas aeruginosa NOT DETECTED NOT DETECTED Final   Stenotrophomonas maltophilia NOT DETECTED NOT DETECTED Final   Candida albicans NOT DETECTED NOT DETECTED Final   Candida auris NOT DETECTED NOT DETECTED Final   Candida glabrata NOT DETECTED NOT DETECTED Final   Candida krusei NOT DETECTED NOT DETECTED Final   Candida parapsilosis NOT DETECTED NOT DETECTED Final   Candida tropicalis NOT DETECTED NOT DETECTED Final   Cryptococcus neoformans/gattii NOT DETECTED NOT DETECTED Final   Methicillin resistance mecA/C DETECTED (A) NOT DETECTED Final    Comment: CRITICAL RESULT CALLED TO, READ BACK BY AND VERIFIED WITH: Rolley Sims 1529 2020/09/06 DB Performed at [facility], [address] [place], [place] [address]   MRSA PCR Screening     Status: None   Collection Time: Sep 06, 2020  9:41 AM   Specimen: Nasopharyngeal  Result Value Ref Range Status   MRSA by PCR NEGATIVE NEGATIVE Final    Comment:        The GeneXpert MRSA Assay (FDA approved for NASAL specimens only), is one component of a comprehensive MRSA colonization surveillance program. It is not intended to diagnose MRSA infection nor to guide or monitor treatment for MRSA infections. Performed at [facility], [address] [place], [place] [address]     Lab Basic Metabolic Panel: Recent Labs  Lab 07/26/2020 2129 2020-09-06 0017 09-06-2020 0346 2020/09/06 0941  NA 150*  --  142 142  K >7.5*  --  6.3* 5.7*  CL 97*  --  92* 90*  CO2 20*  --  20* 18*  GLUCOSE 159*  --  28* 109*  BUN 142*  --  78* 81*  CREATININE 20.60*  --  12.54* 13.49*  CALCIUM 14.2*  --  10.5* 9.2  PHOS 10.4* 7.7*  --   --    Liver Function Tests: Recent Labs  Lab 07/24/2020 2129 September 10, 2020 0346  AST 806* 2,275*  ALT 213* 522*  ALKPHOS 168* 377*  BILITOT 1.8* 2.2*  PROT 6.7 6.6  ALBUMIN 2.0* 1.9*   No results for  input(s): LIPASE, AMYLASE in the last 168 hours. No results for input(s): AMMONIA in the last 168 hours. CBC: Recent Labs  Lab 07/30/2020 1928 09/10/20 0346  WBC 15.0* 23.9*  NEUTROABS 11.7*  --   HGB 9.4* 10.8*  HCT 30.2* 34.1*  MCV 95.9 95.3  PLT 580* 518*   Cardiac Enzymes: No results for input(s): CKTOTAL, CKMB, CKMBINDEX, TROPONINI in the last 168 hours. Sepsis Labs: Recent Labs  Lab 07/16/2020 1928 08/02/2020 1945 07/29/2020 2129 Sep 10, 2020 0346  WBC 15.0*  --   --  23.9*  LATICACIDVEN  --  >11.0* >11.0*  --       Maretta Bees Reeshemah Nazaryan 09/10/2020, 4:41 PM

## 2020-09-09 NOTE — ED Notes (Signed)
Pt to and from CT with RN and RT without complication. Dewaine Conger, NP paged for and verbally notified over phone critical blood glucose of 28 and elevated potassium. See MAR for med admin.

## 2020-09-09 NOTE — Progress Notes (Signed)
GOALS OF CARE DISCUSSION  The Clinical status was relayed to family in detail. Daughter at bedside  Updated and notified of patients medical condition.  Patient remains unresponsive and will not open eyes to command.    Patient is having a weak cough and struggling to remove secretions.   Patient with increased WOB and using accessory muscles to breathe Explained to family course of therapy and the modalities     Patient with Progressive multiorgan failure with a very high probablity of a very minimal chance of meaningful recovery despite all aggressive and optimal medical therapy. Patient is in the Dying  Process associated with Suffering.  Family understands the situation. Patient with near cardiac arrest again witnessed by daughter We pushed 1 mg atropine and 3 amps Bicarb, increased LEVO to 100 HR now 39's  The daughter has  consented and agreed to DNR status She does not want CPR or shock therapy  Family are satisfied with Plan of action and management. All questions answered  Prognosis is grave  Additional CC time 38 mins   Albana Saperstein Patricia Pesa, M.D.  [facility]  Medical Director [place] Director [facility] Cardio-Pulmonary Department

## 2020-09-09 NOTE — Progress Notes (Signed)
CRITICAL CARE NOTE 87 yo AAF with ESRD missed HD last week due to snow +increased lethargy and somnolence-bought in by family to evaluate Severe resp failure from acute Sudden cardiac death due to hyperkalemia   06-Sep-2022 cardiac arrest, family updated 2/1 remains intubated     CC  follow up respiratory failure  SUBJECTIVE Patient remains critically ill Prognosis is guarded  Vent Mode: PRVC FiO2 (%):  [40 %-100 %] 40 % Set Rate:  [16 bmp] 16 bmp Vt Set:  [450 mL] 450 mL PEEP:  [5 cmH20] 5 cmH20 CBC    Component Value Date/Time   WBC 23.9 (H) 2020/09/07 0346   RBC 3.58 (L) 09/07/2020 0346   HGB 10.8 (L) 2020/09/07 0346   HCT 34.1 (L) 09/07/20 0346   PLT 518 (H) 2020-09-07 0346   MCV 95.3 09/07/20 0346   MCH 30.2 2020-09-07 0346   MCHC 31.7 09/07/2020 0346   RDW 17.2 (H) 2020/09/07 0346   LYMPHSABS 1.8 September 06, 2020 1928   MONOABS 0.3 2020-09-06 1928   EOSABS 0.0 09-06-20 1928   BASOSABS 0.1 09-06-2020 1928   BMP Latest Ref Rng & Units 2020/09/07 September 07, 2020 2020-09-06  Glucose 70 - 99 mg/dL 109(H) 28(LL) 159(H)  BUN 8 - 23 mg/dL 81(H) 78(H) 142(H)  Creatinine 0.44 - 1.00 mg/dL 13.49(H) 12.54(H) 20.60(H)  Sodium 135 - 145 mmol/L 142 142 150(H)  Potassium 3.5 - 5.1 mmol/L 5.7(H) 6.3(HH) >7.5(HH)  Chloride 98 - 111 mmol/L 90(L) 92(L) 97(L)  CO2 22 - 32 mmol/L 18(L) 20(L) 20(L)  Calcium 8.9 - 10.3 mg/dL 9.2 10.5(H) 14.2(HH)    BP (!) 118/54   Pulse (!) 49   Temp (!) 95.72 F (35.4 C)   Resp (!) 0   Ht '5\' 3"'$  (1.6 m)   Wt 64.4 kg   SpO2 96%   BMI 25.15 kg/m    I/O last 3 completed shifts: In: 100 [IV Piggyback:100] Out: 0  Total I/O In: 1781.8 [I.V.:1731.8; IV Piggyback:50] Out: 0   SpO2: 96 % FiO2 (%): 40 %  Estimated body mass index is 25.15 kg/m as calculated from the following:   Height as of this encounter: '5\' 3"'$  (1.6 m).   Weight as of this encounter: 64.4 kg.  SIGNIFICANT EVENTS   REVIEW OF SYSTEMS  PATIENT IS UNABLE TO PROVIDE COMPLETE  REVIEW OF SYSTEMS DUE TO SEVERE CRITICAL ILLNESS   Pressure Injury 2020/09/07 Sacrum Deep Tissue Pressure Injury - Purple or maroon localized area of discolored intact skin or blood-filled blister due to damage of underlying soft tissue from pressure and/or shear. pink, red, broken skin. Surrounding tissue (Active)  09-07-20 0913  Location: Sacrum  Location Orientation:   Staging: Deep Tissue Pressure Injury - Purple or maroon localized area of discolored intact skin or blood-filled blister due to damage of underlying soft tissue from pressure and/or shear.  Wound Description (Comments): pink, red, broken skin. Surrounding tissue hard, dark, discolored.  Present on Admission: Yes      PHYSICAL EXAMINATION:  GENERAL:critically ill appearing, +resp distress HEAD: Normocephalic, atraumatic.  EYES: Pupils equal, round, reactive to light.  No scleral icterus.  MOUTH: Moist mucosal membrane. NECK: Supple.  PULMONARY: +rhonchi, +wheezing CARDIOVASCULAR: S1 and S2. Regular rate and rhythm. No murmurs, rubs, or gallops.  GASTROINTESTINAL: Soft, nontender, -distended.  Positive bowel sounds.   MUSCULOSKELETAL: No swelling, clubbing, or edema.  NEUROLOGIC: obtunded, GCS<8 SKIN:intact,warm,dry  MEDICATIONS: I have reviewed all medications and confirmed regimen as documented   CULTURE RESULTS   Recent Results (from the  past 240 hour(s))  Blood culture (routine x 2)     Status: None (Preliminary result)   Collection Time: 07/16/2020  7:50 PM   Specimen: BLOOD  Result Value Ref Range Status   Specimen Description BLOOD BLOOD LEFT WRIST  Final   Special Requests   Final    BOTTLES DRAWN AEROBIC AND ANAEROBIC Blood Culture adequate volume   Culture   Final    NO GROWTH < 12 HOURS Performed at [facility], [address], [place], [place] [address]    Report Status PENDING  Incomplete  Blood culture (routine x 2)     Status: None (Preliminary result)   Collection Time: 07/25/2020   7:55 PM   Specimen: BLOOD  Result Value Ref Range Status   Specimen Description BLOOD BLOOD RIGHT FOREARM  Final   Special Requests   Final    BOTTLES DRAWN AEROBIC AND ANAEROBIC Blood Culture adequate volume   Culture   Final    NO GROWTH < 12 HOURS Performed at [facility], [address] [place], [place] [address]    Report Status PENDING  Incomplete  MRSA PCR Screening     Status: None   Collection Time: 09-09-20  9:41 AM   Specimen: Nasopharyngeal  Result Value Ref Range Status   MRSA by PCR NEGATIVE NEGATIVE Final    Comment:        The GeneXpert MRSA Assay (FDA approved for NASAL specimens only), is one component of a comprehensive MRSA colonization surveillance program. It is not intended to diagnose MRSA infection nor to guide or monitor treatment for MRSA infections. Performed at [facility], [address] [place], [place] [address]           IMAGING    CT Head Wo Contrast  Result Date: 09/09/20 CLINICAL DATA:  Mental status change . EXAM: CT HEAD WITHOUT CONTRAST TECHNIQUE: Contiguous axial images were obtained from the base of the skull through the vertex without intravenous contrast. COMPARISON:  Chest x-ray 12/15/2018 FINDINGS: Brain: Patchy and confluent areas of decreased attenuation are noted throughout the deep and periventricular white matter of the cerebral hemispheres bilaterally, compatible with chronic microvascular ischemic disease. Nonspecific mineralization of bilateral basal ganglia. No evidence of large-territorial acute infarction. No parenchymal hemorrhage. No mass lesion. No extra-axial collection. No mass effect or midline shift. No hydrocephalus. Basilar cisterns are patent. Vascular: No hyperdense vessel. Atherosclerotic calcifications are present within the cavernous internal carotid and vertebral arteries. Skull: No acute fracture or focal lesion. Sinuses/Orbits: Bilateral maxillary, sphenoid, ethmoid sinus mucosal  thickening. Paranasal sinuses and mastoid air cells are clear. Bilateral lens replacement. Otherwise the orbits are unremarkable. Other: None. IMPRESSION: 1. No acute intracranial abnormality. 2. Bilateral maxillary, sphenoid, ethmoid sinus mucosal thickening. Correlate with signs and symptoms of acute sinusitis. Electronically Signed   By: Iven Finn M.D.   On: 09-Sep-2020 05:04   DG Chest Port 1 View  Result Date: 07/13/2020 CLINICAL DATA:  Check endotracheal tube placement EXAM: PORTABLE CHEST 1 VIEW COMPARISON:  07/31/2020 FINDINGS: Endotracheal tube and gastric catheter are noted. The proximal side port of the gastric catheter is noted at the gastroesophageal junction. This could be advanced deeper into the stomach. Cardiac shadow is enlarged. Aortic calcifications are seen. Lungs are well aerated bilaterally. Patchy airspace opacity is noted right greater than left consistent with the known history of prior COVID-19 positivity. This has increased slightly in the interval from the prior exam. IMPRESSION: Tubes and lines as described above.  Increasing airspace opacity consistent with the given clinical history of COVID-19 positivity. Gastric catheter could be advanced deeper into the stomach. Electronically Signed   By: Inez Catalina M.D.   On: 07/20/2020 20:11   DG Abd Portable 1V  Result Date: August 21, 2020 CLINICAL DATA:  Orogastric tube placement. EXAM: PORTABLE ABDOMEN - 1 VIEW COMPARISON:  09/12/2017 FINDINGS: An orogastric tube is seen with tip overlying the gastric fundus. Small amount of contrast noted in the gastric fundus. Mildly dilated small bowel loops are noted in the mid abdomen, which may be due to mild ileus although a low-grade or partial small bowel obstruction cannot be excluded. Right femoral vein catheter and temperature probe in urinary bladder noted. IMPRESSION: Orogastric tube tip overlies the gastric fundus. Mildly dilated small bowel loops in the mid abdomen, which may be due  to mild ileus although partial/low-grade small bowel obstruction cannot be excluded. Electronically Signed   By: Marlaine Hind M.D.   On: August 21, 2020 11:28     Nutrition Status: Nutrition Problem: Severe Malnutrition Etiology: acute illness (COVID 19) Signs/Symptoms: moderate fat depletion,moderate muscle depletion,percent weight loss Percent weight loss: 11 %       Indwelling Urinary Catheter continued, requirement due to   Reason to continue Indwelling Urinary Catheter strict Intake/Output monitoring for hemodynamic instability   Central Line/ continued, requirement due to  Reason to continue [place] of central venous pressure or other hemodynamic parameters and poor IV access   Ventilator continued, requirement due to severe respiratory failure   Ventilator Sedation RASS 0 to -2      ASSESSMENT AND PLAN SYNOPSIS 87 yo AAF with multiple medical issues missed HD for 1 week with recent dx of COVID 19  Infection with acute and sudden cardiac arrest  likely due to acute ischemic cardiomyopathy with severe lactic acidosis with high potassium    ACUTE SYSTOLIC CARDIAC FAILURE-ISCHEMIC CARDIOMYOPATHY Vent support PATIENT WITH PROLONGED CARDIAC ARREST WITH VERY UNFAVORABLE OUTCOME, PATIENT IS NOT A CANDIDATE FOR HYPOTHERMIA PROTOCOL    Severe ACUTE Hypoxic and Hypercapnic Respiratory Failure -continue Full MV support -continue Bronchodilator Therapy -Wean Fio2 and PEEP as tolerated -VAP/VENT bundle implementation   END STAGE KIDNEY INJURY/Renal Failure -continue Foley Catheter-assess need -Avoid nephrotoxic agents -Follow urine output, BMP -Ensure adequate renal perfusion, optimize oxygenation -Renal dose medications HD as needed    NEUROLOGY Acute toxic metabolic encephalopathy Assess NEURO STATUS  SHOCK-CARDIOGENIC -use vasopressors to keep MAP>65   CARDIAC ICU monitoring  GI GI PROPHYLAXIS as indicated  DIET-->NPO Constipation protocol  as indicated  ENDO - will use ICU hypoglycemic\Hyperglycemia protocol if indicated     ELECTROLYTES -follow labs as needed -replace as needed -pharmacy consultation and following   DVT/GI PRX ordered and assessed TRANSFUSIONS AS NEEDED MONITOR FSBS I Assessed the need for Labs I Assessed the need for Foley I Assessed the need for Central Venous Line Family Discussion when available I Assessed the need for Mobilization I made an Assessment of medications to be adjusted accordingly Safety Risk assessment completed   CASE DISCUSSED IN MULTIDISCIPLINARY ROUNDS WITH ICU TEAM  Critical Care Time devoted to patient care services described in this note is 65 minutes.   Overall, patient is critically ill, prognosis is guarded.  Patient with Multiorgan failure and at high risk for cardiac arrest and death.   Prognosis is very poor  Corrin Parker, M.D.  [facility]  Medical Director [place] Director [facility] Cardio-Pulmonary Department

## 2020-09-09 NOTE — Progress Notes (Addendum)
Initial Nutrition Assessment  DOCUMENTATION CODES:   Not applicable  INTERVENTION:   If tube feeds initiated, recommend:   Vital 1.5 _0 /hr- Initiate at 53m/hr and increase by 179mhr q 12 hours until goal rate is reached.   Pro-Source 4532mID via tube, provides 40kcal and 11g of protein per serving   Free water flushes 67m57m hours to maintain tube patency   Regimen provides 1376kcal/day, 103g/day protein and 1056ml65m free water.   Pt at high refeed risk; recommend monitor potassium, magnesium and phosphorus labs daily until stable  Rena-vit daily via tube   Juven Fruit Punch BID via tube, each serving provides 95kcal and 2.5g of protein (amino acids glutamine and arginine)  NUTRITION DIAGNOSIS:   Severe Malnutrition related to acute illness (COVID 19) as evidenced by moderate fat depletion,moderate muscle depletion,percent weight loss.  GOAL:   Provide needs based on ASPEN/SCCM guidelines  MONITOR:   Vent status,Labs,Weight trends,Skin,I & O's  REASON FOR ASSESSMENT:   Ventilator    ASSESSMENT:   89 y.31 female with a past medical history of CHF, DM, ESRD on HD, paroxysmal A. fib, HTN and gout who is admitted with cardiac arrest secondary to hyperkalemia in setting of missed dialysis and recent COVID 19.   Pt sedated and ventilated. OGT in place. Family member at bedside reports pt with poor appetite and oral intake for several weeks pta. Pt has been eating mainly soups and ice cream at home. Pt has been drinking vanilla Nepro. Pt diagnosed with COVID 19 three weeks ago. Per chart, pt has lost 17lbs(11%) over the past 3-4 weeks; this is significant weight loss. No plans for tube feeds today  Medications reviewed and include: colace, veltessa, miralax, pepcid, heparin, levophed, Na Bicarbonate  Labs reviewed: K 5.7(H), BUN 81(H), creat 13.49(H), alk phos 377(H), AST 2275(H), ALT 522(H) Wbc- 23.9(H), Hgb 10.8(L), Hct 34.1(L) cbgs- 107, 152, 126, 67, 160 x 24  hrs AIC 6.0- 11/12  Patient is currently intubated on ventilator support MV: 9.9 L/min Temp (24hrs), Avg:97.8 F (36.6 C), Min:93.7 F (34.3 C), Max:98.7 F (37.1 C)  Propofol: stopped  MAP- >65mmH73mUTRITION - FOCUSED PHYSICAL EXAM:  Flowsheet Row Most Recent Value  Orbital Region Moderate depletion  Upper Arm Region Moderate depletion  Thoracic and Lumbar Region Moderate depletion  Buccal Region Moderate depletion  Temple Region Severe depletion  Clavicle Bone Region Moderate depletion  Clavicle and Acromion Bone Region Moderate depletion  Scapular Bone Region Moderate depletion  Dorsal Hand Severe depletion  Patellar Region Moderate depletion  Anterior Thigh Region Moderate depletion  Posterior Calf Region Moderate depletion  Edema (RD Assessment) None  Hair Reviewed  Eyes Reviewed  Mouth Reviewed  Skin Reviewed  Nails Reviewed     Diet Order:   Diet Order            Diet NPO time specified  Diet effective now                EDUCATION NEEDS:   No education needs have been identified at this time  Skin:  Skin Assessment: Reviewed RN Assessment   Deep tissue pressure injury to the sacrum approx 3X3cm, darker colored skin surrounding a Stage 2 pressure injury which has evolved in the center; 1X1X.1cm, pink and dry Left 3rd and 4th toes with black dry hard eschar; no topical treatment is indicated.  Right tip of great toe with full thickness wound; 1X1X.1cm, red and dry 5th toe posterior area with full thickness wound; .3X.3X.1cm, red  and dry  Last BM:  PTA  Height:   Ht Readings from Last 1 Encounters:  04-Sep-2020 _0  (1.6 m)    Weight:   Wt Readings from Last 1 Encounters:  09/04/20 64.4 kg    Ideal Body Weight:  52.3 kg  BMI:  Body mass index is 25.15 kg/m.  Estimated Nutritional Needs:   Kcal:  1300kcal/day  Protein:  90-105g/day  Fluid:  UOP +1L  Koleen Distance MS, RD, LDN Please refer to [facility] for RD and/or RD  on-call/weekend/after hours pager

## 2020-09-09 NOTE — ED Notes (Signed)
Dialysis finished.

## 2020-09-09 NOTE — ED Notes (Signed)
Pulse check. VTACH on monitor without a pulse. 200 joules shock given. CPR resumed. Pt still being ventilated with BVM.

## 2020-09-09 NOTE — Progress Notes (Addendum)
ANTICOAGULATION CONSULT NOTE - Initial Consult  Pharmacy Consult for Heparin  Indication: chest pain/ACS  No Known Allergies  Patient Measurements:   Heparin Dosing Weight: 66.6 kg   Vital Signs: Temp: 97.2 F (36.2 C) (02/01 0430) BP: 156/57 (02/01 0510) Pulse Rate: 53 (02/01 0510)  Labs: Recent Labs    07/31/2020 1928 07/23/2020 2005 08/10/2020 2129 Aug 31, 2020 0017 August 31, 2020 0346  HGB 9.4*  --   --   --  10.8*  HCT 30.2*  --   --   --  34.1*  PLT 580*  --   --   --  518*  APTT  --  35  --   --   --   LABPROT  --  24.7*  --   --   --   INR  --  2.3*  --   --   --   CREATININE  --   --  20.60*  --   --   TROPONINIHS  --   --  464* 144* 1,697*    Estimated Creatinine Clearance: 1.8 mL/min (A) (by C-G formula based on SCr of 20.6 mg/dL (H)).   Medical History: Past Medical History:  Diagnosis Date  . (HFpEF) heart failure with preserved ejection fraction ([place])    a. 10/2017 Echo: EF 60-65%, Gr1 DD, mild MR, mildly dil LA/RA. PASP 22mHg.  . Arthritis   . Back pain   . Bronchitis   . Cataract   . Diabetes mellitus without complication (HCC)    no meds currently  . [place]   . Dysrhythmia   . ESRD (end stage renal disease) ([place]    a. 2018 - initially on HD but then transitioned to nightly PD in 08/2017.  .Marland KitchenGout   . History of methicillin resistant staphylococcus aureus (MRSA)   . Hypertension   . PAF (paroxysmal atrial fibrillation) (HCC)    a. CHA2DS2VASc = 5-->eliquis 2.5 BID.    Medications:  (Not in a hospital admission)   Assessment: Pharmacy consulted to dose heparin in this 87year old female admitted with ACS/NSTEMI.  CrCl = 1.8 ml/min  Pt was on heparin 5000 units SQ Q8H previously but no prior doses given within past 24 hrs.  Eliquis 2.5 mg PO BID listed on PTA med list but no doses recorded within past 24 hrs.   1/31:  INR = 2.3,  APTT = 35   Goal of Therapy:  Heparin level 0.3-0.7 units/ml Monitor platelets by anticoagulation  protocol: Yes   Plan:  Baseline aPTT not elevated so there does not appear to be any prior effect from Eliquis ;  INR is elevated though unclear why.  Will orderHeparin 4000 units IV X 1 bolus ordered followed by 800 units/hr. Will check HL 8 hrs after start of drip.  Will check CBC daily.   Baila Rouse D 202/20/20225:17 AM

## 2020-09-09 NOTE — Progress Notes (Signed)
Pt was transported to CCU from the ED while on the vent.

## 2020-09-09 NOTE — Progress Notes (Signed)
PHARMACY - PHYSICIAN COMMUNICATION CRITICAL VALUE ALERT - BLOOD CULTURE IDENTIFICATION (BCID)  Stacy Pham is an 87 y.o. female who presented to [facility] on 07/12/2020 with a chief complaint of increase lethargy  Assessment:  Presented in cardiac arrest s/p CPR.  Blood culture 1/31 with 1 of 4 bottles with GPC, BCID = MRSE  Name of physician (or Provider) Contacted: Dr Mortimer Fries  Current antibiotics: none  Changes to prescribed antibiotics recommended:  Recommendations accepted by provider - no antibiotics warranted at this time, suspect contaminant  Results for orders placed or performed during the hospital encounter of 07/14/2020  Blood Culture ID Panel (Reflexed) (Collected: 07/26/2020  7:55 PM)  Result Value Ref Range   Enterococcus faecalis NOT DETECTED NOT DETECTED   Enterococcus Faecium NOT DETECTED NOT DETECTED   Listeria monocytogenes NOT DETECTED NOT DETECTED   Staphylococcus species DETECTED (A) NOT DETECTED   Staphylococcus aureus (BCID) NOT DETECTED NOT DETECTED   Staphylococcus epidermidis DETECTED (A) NOT DETECTED   Staphylococcus lugdunensis NOT DETECTED NOT DETECTED   Streptococcus species NOT DETECTED NOT DETECTED   Streptococcus agalactiae NOT DETECTED NOT DETECTED   Streptococcus pneumoniae NOT DETECTED NOT DETECTED   Streptococcus pyogenes NOT DETECTED NOT DETECTED   A.calcoaceticus-baumannii NOT DETECTED NOT DETECTED   Bacteroides fragilis NOT DETECTED NOT DETECTED   Enterobacterales NOT DETECTED NOT DETECTED   Enterobacter cloacae complex NOT DETECTED NOT DETECTED   Escherichia coli NOT DETECTED NOT DETECTED   Klebsiella aerogenes NOT DETECTED NOT DETECTED   Klebsiella oxytoca NOT DETECTED NOT DETECTED   Klebsiella pneumoniae NOT DETECTED NOT DETECTED   Proteus species NOT DETECTED NOT DETECTED   Salmonella species NOT DETECTED NOT DETECTED   Serratia marcescens NOT DETECTED NOT DETECTED   Haemophilus influenzae NOT DETECTED NOT DETECTED   Neisseria  meningitidis NOT DETECTED NOT DETECTED   Pseudomonas aeruginosa NOT DETECTED NOT DETECTED   Stenotrophomonas maltophilia NOT DETECTED NOT DETECTED   Candida albicans NOT DETECTED NOT DETECTED   Candida auris NOT DETECTED NOT DETECTED   Candida glabrata NOT DETECTED NOT DETECTED   Candida krusei NOT DETECTED NOT DETECTED   Candida parapsilosis NOT DETECTED NOT DETECTED   Candida tropicalis NOT DETECTED NOT DETECTED   Cryptococcus neoformans/gattii NOT DETECTED NOT DETECTED   Methicillin resistance mecA/C DETECTED (A) NOT DETECTED    Doreene Eland, PharmD, BCPS.   Work Cell: [phone] 08-18-20 4:20 PM

## 2020-09-09 DEATH — deceased

## 2021-04-22 IMAGING — DX DG TOE GREAT 2+V*L*
3 series · 3 of 3 positions shown · non-contrast
Comparison: No recent.

CLINICAL DATA: Contusion.  Pain.

EXAM:
LEFT GREAT TOE

[toe ap]
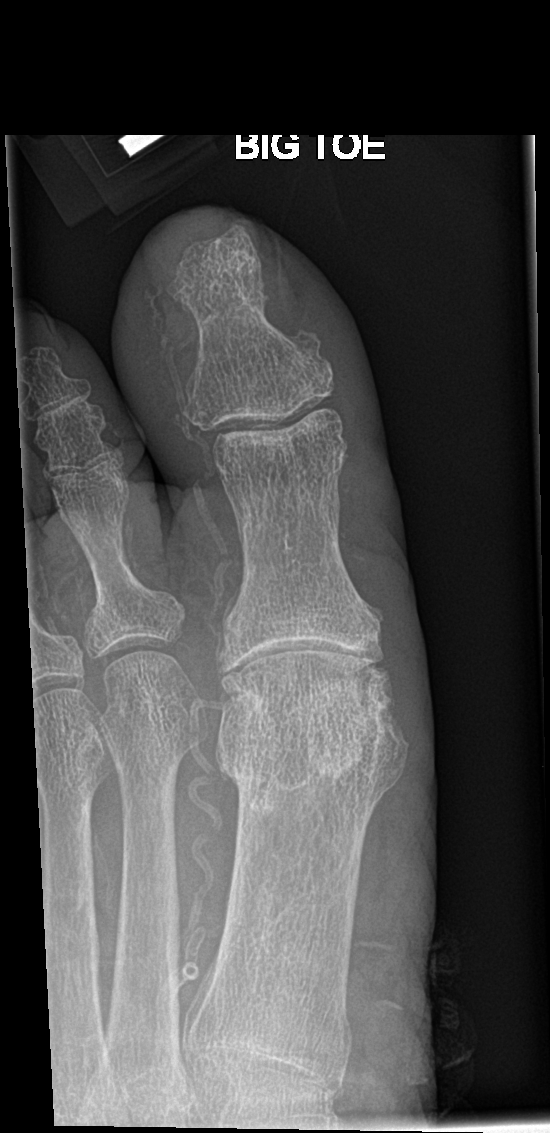

[toe obl]
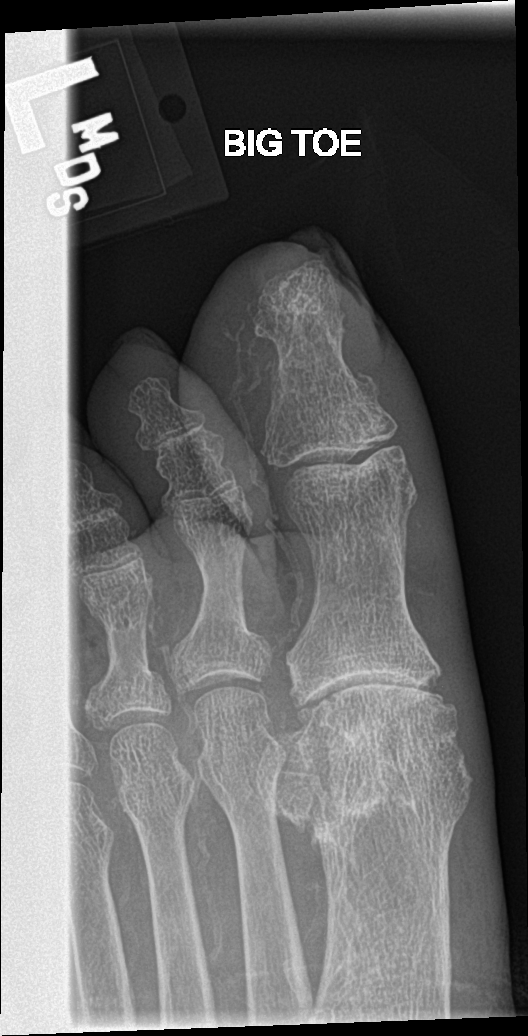

[toe lat]
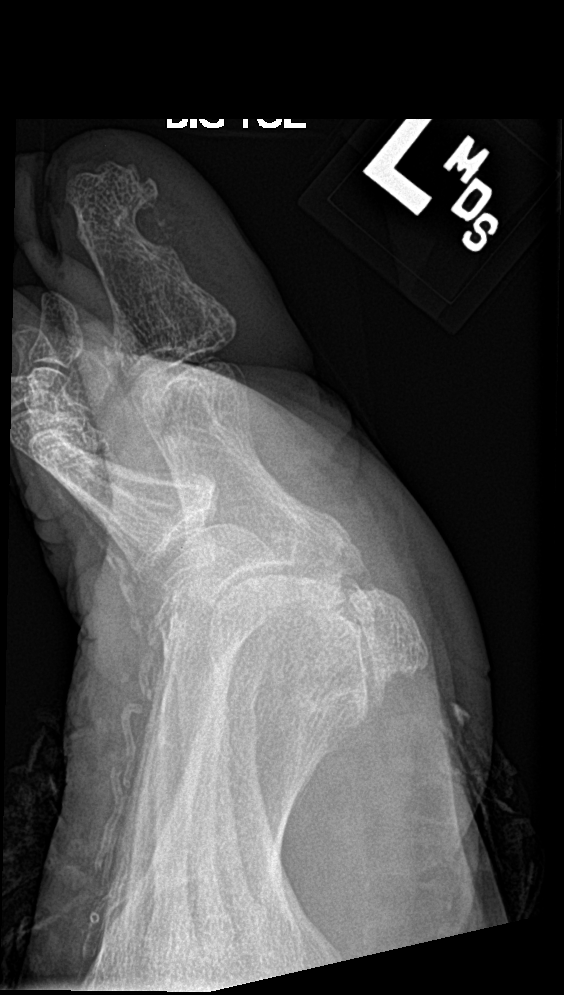

[3 of 3 positions shown; findings below may reference images not displayed]

FINDINGS: Diffuse osteopenia and degenerative change. Degenerative changes
most prominent about the first MTP joint. Peripheral vascular
calcification.
IMPRESSION: 1. Diffuse osteopenia and degenerative change. Degenerative changes
most prominent about the first MTP joint. No acute bony abnormality.

2.  Peripheral vascular disease.
